# Patient Record
Sex: Female | Born: 1945 | Race: White | Hispanic: No | Marital: Married | Smoking: Former smoker
Health system: Southern US, Community
[De-identification: ages and names within clinical notes are randomized; demographics above are authoritative.]

## PROBLEM LIST (undated history)

## (undated) DIAGNOSIS — E119 Type 2 diabetes mellitus without complications: Secondary | ICD-10-CM

## (undated) DIAGNOSIS — L509 Urticaria, unspecified: Secondary | ICD-10-CM

## (undated) DIAGNOSIS — I219 Acute myocardial infarction, unspecified: Secondary | ICD-10-CM

## (undated) HISTORY — DX: Acute myocardial infarction, unspecified: I21.9

## (undated) HISTORY — PX: TUBAL LIGATION: SHX77

## (undated) HISTORY — DX: Type 2 diabetes mellitus without complications: E11.9

## (undated) HISTORY — DX: Urticaria, unspecified: L50.9

---
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
## 2004-04-16 HISTORY — PX: ABDOMINAL HYSTERECTOMY: SHX81

## 2014-07-14 DIAGNOSIS — I10 Essential (primary) hypertension: Secondary | ICD-10-CM | POA: Insufficient documentation

## 2014-07-14 DIAGNOSIS — J309 Allergic rhinitis, unspecified: Secondary | ICD-10-CM

## 2014-07-14 DIAGNOSIS — Z8542 Personal history of malignant neoplasm of other parts of uterus: Secondary | ICD-10-CM

## 2014-07-14 HISTORY — DX: Allergic rhinitis, unspecified: J30.9

## 2014-07-14 HISTORY — DX: Personal history of malignant neoplasm of other parts of uterus: Z85.42

## 2014-07-14 HISTORY — DX: Essential (primary) hypertension: I10

## 2015-01-18 DIAGNOSIS — E119 Type 2 diabetes mellitus without complications: Secondary | ICD-10-CM | POA: Insufficient documentation

## 2015-05-02 DIAGNOSIS — N183 Chronic kidney disease, stage 3 unspecified: Secondary | ICD-10-CM | POA: Insufficient documentation

## 2015-05-02 HISTORY — DX: Chronic kidney disease, stage 3 unspecified: N18.30

## 2015-09-13 DIAGNOSIS — K5730 Diverticulosis of large intestine without perforation or abscess without bleeding: Secondary | ICD-10-CM | POA: Insufficient documentation

## 2015-09-13 DIAGNOSIS — M51379 Other intervertebral disc degeneration, lumbosacral region without mention of lumbar back pain or lower extremity pain: Secondary | ICD-10-CM

## 2015-09-13 DIAGNOSIS — L219 Seborrheic dermatitis, unspecified: Secondary | ICD-10-CM

## 2015-09-13 DIAGNOSIS — M5137 Other intervertebral disc degeneration, lumbosacral region: Secondary | ICD-10-CM

## 2015-09-13 HISTORY — DX: Seborrheic dermatitis, unspecified: L21.9

## 2015-09-13 HISTORY — DX: Other intervertebral disc degeneration, lumbosacral region without mention of lumbar back pain or lower extremity pain: M51.379

## 2015-09-13 HISTORY — DX: Other intervertebral disc degeneration, lumbosacral region: M51.37

## 2015-09-13 HISTORY — DX: Diverticulosis of large intestine without perforation or abscess without bleeding: K57.30

## 2015-10-28 DIAGNOSIS — E876 Hypokalemia: Secondary | ICD-10-CM | POA: Insufficient documentation

## 2015-10-28 HISTORY — DX: Hypokalemia: E87.6

## 2016-07-17 LAB — HM MAMMOGRAPHY

## 2016-12-31 DIAGNOSIS — E785 Hyperlipidemia, unspecified: Secondary | ICD-10-CM

## 2016-12-31 DIAGNOSIS — E1169 Type 2 diabetes mellitus with other specified complication: Secondary | ICD-10-CM

## 2016-12-31 HISTORY — DX: Type 2 diabetes mellitus with other specified complication: E11.69

## 2017-09-04 DIAGNOSIS — K2100 Gastro-esophageal reflux disease with esophagitis, without bleeding: Secondary | ICD-10-CM

## 2017-09-04 HISTORY — DX: Gastro-esophageal reflux disease with esophagitis, without bleeding: K21.00

## 2017-09-07 DIAGNOSIS — I219 Acute myocardial infarction, unspecified: Secondary | ICD-10-CM

## 2017-09-07 HISTORY — DX: Acute myocardial infarction, unspecified: I21.9

## 2017-09-10 ENCOUNTER — Encounter: Payer: Self-pay | Admitting: Family Medicine

## 2017-09-12 ENCOUNTER — Encounter: Payer: Self-pay | Admitting: Family Medicine

## 2017-09-12 ENCOUNTER — Telehealth: Payer: Self-pay

## 2017-09-12 NOTE — Telephone Encounter (Signed)
Copied from CRM [id number]. Topic: Appointment Scheduling - Scheduling Inquiry for Clinic >> Sep 12, 2017 12:51 PM Oneal Grout wrote: Reason for CRM: Requesting to establish care with Dr Abner Greenspan, [facility] medicare, spouse is a current patient of Dr Hilary Hertz   Please advise

## 2017-09-12 NOTE — Telephone Encounter (Signed)
Will take her as a patient

## 2017-09-13 NOTE — Telephone Encounter (Signed)
Called pt and left voicemail for her to call back to set up new pt appt with Dr. Abner Greenspan.

## 2017-09-24 ENCOUNTER — Encounter: Payer: Self-pay | Admitting: Family Medicine

## 2017-09-24 ENCOUNTER — Ambulatory Visit: Payer: Medicare Other | Admitting: Family Medicine

## 2017-09-24 VITALS — BP 110/68 | HR 82 | Temp 98.2°F | Resp 18 | Ht 62.0 in | Wt 154.8 lb

## 2017-09-24 DIAGNOSIS — Z1231 Encounter for screening mammogram for malignant neoplasm of breast: Secondary | ICD-10-CM

## 2017-09-24 DIAGNOSIS — E119 Type 2 diabetes mellitus without complications: Secondary | ICD-10-CM

## 2017-09-24 DIAGNOSIS — E785 Hyperlipidemia, unspecified: Secondary | ICD-10-CM

## 2017-09-24 DIAGNOSIS — N183 Chronic kidney disease, stage 3 unspecified: Secondary | ICD-10-CM

## 2017-09-24 DIAGNOSIS — I2510 Atherosclerotic heart disease of native coronary artery without angina pectoris: Secondary | ICD-10-CM

## 2017-09-24 DIAGNOSIS — M545 Low back pain: Secondary | ICD-10-CM

## 2017-09-24 DIAGNOSIS — E1169 Type 2 diabetes mellitus with other specified complication: Secondary | ICD-10-CM | POA: Diagnosis not present

## 2017-09-24 DIAGNOSIS — Z1239 Encounter for other screening for malignant neoplasm of breast: Secondary | ICD-10-CM

## 2017-09-24 NOTE — Progress Notes (Signed)
Subjective:  I acted as a Education administrator for Dr. Charlett Blake. [place], [place]  Patient ID: Kristina Lee, female    DOB: 1945-09-04, 72 y.o.   MRN: [id number]  Chief Complaint  Patient presents with  . Establish Care    HPI  Patient is in today to establish care and she has recently been very ill ultimately having an MI, requiring a stent in her LAD and having a blood clot in her heart. She is now on Brilinta and tolerating it well. Her chest and jaw pain have resolved. She has a long history of low back pain which is stalbe. She has been struggling with a pruritic rash for a month it is improving and has responded some to her topical treatments has been diffuse and now is essentially resolved. Denies CP/palp/SOB/HA/congestion/fevers/GI or GU c/o. Taking meds as prescribed  Patient Care Team: Mosie Lukes, MD as PCP - General (Family Medicine)   Past Medical History:  Diagnosis Date  . Diabetes mellitus without complication ([place])   . Heart attack ([place]) 09/07/2017      Family History  Problem Relation Age of Onset  . Cancer Mother        liver cancer, breast cancer x 2   . Alcohol abuse Mother        smoker  . Heart disease Father        MI  . Alcohol abuse Father        smoker  . Depression Sister   . Tremor Sister   . Cancer Brother        pancreatic  . Diabetes Maternal Grandmother   . Hearing loss Maternal Grandmother   . Depression Maternal Grandmother     Social History   Socioeconomic History  . Marital status: Married    Spouse name: Not on file  . Number of children: Not on file  . Years of education: Not on file  . Highest education level: Not on file  Occupational History  . Not on file  Social Needs  . Financial resource strain: Not on file  . Food insecurity:    Worry: Not on file    Inability: Not on file  . Transportation needs:    Medical: Not on file    Non-medical: Not on file  Tobacco Use  . Smoking status: Never Smoker  . Smokeless tobacco: Never  Used  Substance and Sexual Activity  . Alcohol use: Not Currently  . Drug use: Not Currently  . Sexual activity: Yes  Lifestyle  . Physical activity:    Days per week: Not on file    Minutes per session: Not on file  . Stress: Not on file  Relationships  . Social connections:    Talks on phone: Not on file    Gets together: Not on file    Attends religious service: Not on file    Active member of club or organization: Not on file    Attends meetings of clubs or organizations: Not on file    Relationship status: Not on file  . Intimate partner violence:    Fear of current or ex partner: Not on file    Emotionally abused: Not on file    Physically abused: Not on file    Forced sexual activity: Not on file  Other Topics Concern  . Not on file  Social History Narrative  . Not on file    Outpatient Medications Prior to Visit  Medication Sig Dispense Refill  .  aspirin EC 81 MG tablet Take 81 mg by mouth daily.    Marland Kitchen atorvastatin (LIPITOR) 80 MG tablet Take 80 mg by mouth daily.    . diphenhydrAMINE (BENADRYL) 2 % cream Apply topically 3 (three) times daily as needed for itching.    . diphenhydrAMINE (BENADRYL) 25 MG tablet Take 25 mg by mouth every 6 (six) hours as needed.    Marland Kitchen lisinopril (PRINIVIL,ZESTRIL) 2.5 MG tablet Take 2.5 mg by mouth daily.    . ticagrelor (BRILINTA) 90 MG TABS tablet Take 90 mg by mouth 2 (two) times daily.     Marland Kitchen tretinoin (RETIN-A) 0.1 % cream Apply topically at bedtime.     No facility-administered medications prior to visit.     Allergies  Allergen Reactions  . Amoxicillin Anaphylaxis  . Hydroxyzine Itching and Other (See Comments)    Other reaction(s): Other (See Comments) Sedation Sedation Sedation   . Sulfa Antibiotics Swelling    Other reaction(s): Swelling tongue tongue tongue     Review of Systems  Constitutional: Negative for chills, fever and malaise/fatigue.  HENT: Negative for congestion and hearing loss.   Eyes: Negative  for discharge.  Respiratory: Negative for cough, sputum production and shortness of breath.   Cardiovascular: Negative for chest pain, palpitations and leg swelling.  Gastrointestinal: Negative for abdominal pain, blood in stool, constipation, diarrhea, heartburn, nausea and vomiting.  Genitourinary: Negative for dysuria, frequency, hematuria and urgency.  Musculoskeletal: Negative for back pain, falls and myalgias.  Skin: Negative for rash.  Neurological: Negative for dizziness, sensory change, loss of consciousness, weakness and headaches.  Endo/Heme/Allergies: Negative for environmental allergies. Does not bruise/bleed easily.  Psychiatric/Behavioral: Negative for depression and suicidal ideas. The patient is not nervous/anxious and does not have insomnia.        Objective:    Physical Exam  Constitutional: She is oriented to person, place, and time. No distress.  HENT:  Head: Normocephalic and atraumatic.  Right Ear: External ear normal.  Left Ear: External ear normal.  Nose: Nose normal.  Mouth/Throat: Oropharynx is clear and moist. No oropharyngeal exudate.  Eyes: Pupils are equal, round, and reactive to light. Conjunctivae are normal. Right eye exhibits no discharge. Left eye exhibits no discharge. No scleral icterus.  Neck: Normal range of motion. Neck supple. No thyromegaly present.  Cardiovascular: Normal rate, regular rhythm, normal heart sounds and intact distal pulses.  No murmur heard. Pulmonary/Chest: Effort normal and breath sounds normal. No respiratory distress. She has no wheezes. She has no rales.  Abdominal: Soft. Bowel sounds are normal. She exhibits no distension and no mass. There is no tenderness.  Musculoskeletal: Normal range of motion. She exhibits no edema or tenderness.  Lymphadenopathy:    She has no cervical adenopathy.  Neurological: She is alert and oriented to person, place, and time. She has normal reflexes. She displays normal reflexes. No cranial  nerve deficit. Coordination normal.  Skin: Skin is warm and dry. No rash noted. She is not diaphoretic.    BP 110/68 (BP Location: Left Arm, Patient Position: Sitting, Cuff Size: Normal)   Pulse 82   Temp 98.2 F (36.8 C) (Oral)   Resp 18   Ht 5' 2"  (1.575 m)   Wt 154 lb 12.8 oz (70.2 kg)   SpO2 97%   BMI 28.31 kg/m  Wt Readings from Last 3 Encounters:  09/24/17 154 lb 12.8 oz (70.2 kg)   BP Readings from Last 3 Encounters:  09/24/17 110/68      There is  no immunization history on file for this patient.  Health Maintenance  Topic Date Due  . HEMOGLOBIN A1C  July 01, 1945  . Hepatitis C Screening  Jun 05, 1945  . FOOT EXAM  09/07/1955  . OPHTHALMOLOGY EXAM  09/07/1955  . TETANUS/TDAP  09/06/1964  . MAMMOGRAM  09/07/1995  . COLONOSCOPY  09/07/1995  . DEXA SCAN  09/07/2010  . PNA vac Low Risk Adult (1 of 2 - PCV13) 09/07/2010  . INFLUENZA VACCINE  11/14/2017    No results found for: WBC, HGB, HCT, PLT, GLUCOSE, CHOL, TRIG, HDL, LDLDIRECT, LDLCALC, ALT, AST, NA, K, CL, CREATININE, BUN, CO2, TSH, PSA, INR, GLUF, HGBA1C, MICROALBUR  No results found for: TSH No results found for: WBC, HGB, HCT, MCV, PLT No results found for: NA, K, CHLORIDE, CO2, GLUCOSE, BUN, CREATININE, BILITOT, ALKPHOS, AST, ALT, PROT, ALBUMIN, CALCIUM, ANIONGAP, EGFR, GFR No results found for: CHOL No results found for: HDL No results found for: LDLCALC No results found for: TRIG No results found for: CHOLHDL No results found for: HGBA1C       Assessment & Plan:   Problem List Items Addressed This Visit    Chronic kidney disease, stage III (moderate) ([place])    Hydrate well and monitor will request old records      Hyperlipidemia associated with type 2 diabetes mellitus ([place])    Tolerating statin, encouraged heart healthy diet, avoid trans fats, minimize simple carbs and saturated fats. Increase exercise as tolerated      Relevant Medications   atorvastatin (LIPITOR) 80 MG tablet   aspirin EC  81 MG tablet   lisinopril (PRINIVIL,ZESTRIL) 2.5 MG tablet   Type 2 diabetes mellitus without complication, without long-term current use of insulin ([facility])    hgba1c acceptable, minimize simple carbs. Increase exercise as tolerated. Historically diet controlled. Follows annually with opthamology.      Relevant Medications   atorvastatin (LIPITOR) 80 MG tablet   aspirin EC 81 MG tablet   lisinopril (PRINIVIL,ZESTRIL) 2.5 MG tablet   CAD (coronary artery disease)    S/p stenting of LAD recently after several weeks of symptoms. Has tolerated Brilinta and has follow up with cardiology soon      Relevant Medications   atorvastatin (LIPITOR) 80 MG tablet   aspirin EC 81 MG tablet   lisinopril (PRINIVIL,ZESTRIL) 2.5 MG tablet   Low back pain    Encouraged moist heat and gentle stretching as tolerated. May try Tylenol and prescription meds as directed and report if symptoms worsen or seek immediate care. Lidocaine prn. She reports she has historically followed with a Dr Demetrius Charity of neurology for this      Relevant Medications   aspirin EC 81 MG tablet    Other Visit Diagnoses    Breast cancer screening    -  Primary   Relevant Orders   MM DIAG BREAST TOMO BILATERAL      I am having Pearletha Forge maintain her atorvastatin, ticagrelor, aspirin EC, lisinopril, diphenhydrAMINE, diphenhydrAMINE, and tretinoin.  No orders of the defined types were placed in this encounter.   CMA served as Education administrator during this visit. History, Physical and Plan performed by medical provider. Documentation and orders reviewed and attested to.  Penni Homans, MD

## 2017-09-24 NOTE — Patient Instructions (Addendum)
DASH or MIND diet   Recommend calcium intake of 1200 to 1500 mg daily, divided into roughly 3 doses. Best source is the diet and a single dairy serving is about 500 mg, a supplement of calcium citrate once or twice daily to balance diet is fine if not getting enough in diet. Also need Vitamin D 2000 IU caps, 1 cap daily if not already taking vitamin D. Also recommend weight baring exercise on hips and upper body to keep bones strong Coronary Artery Disease, Female Coronary artery disease (CAD) is a condition in which the arteries that lead to the heart (coronary arteries) become narrow or blocked. The narrowing or blockage can lead to decreased blood flow to the heart. Prolonged reduced blood flow can cause a heart attack (myocardial infarction or MI). This condition may also be called coronary heart disease. Because CAD is the leading cause of death in women, it is important to understand what causes this condition and how it is treated. What are the causes? CAD is most often caused by atherosclerosis. This is the buildup of fat and cholesterol (plaque) on the inside of the arteries. Over time, the plaque may narrow or block the artery, reducing blood flow to the heart. Plaque can also become weak and break off within a coronary artery and cause a sudden blockage. Other less common causes of CAD include:  An embolism or blood clot in a coronary artery.  A tearing of the artery (spontaneous coronary artery dissection).  An aneurysm.  Inflammation (vasculitis) in the artery wall.  What increases the risk? The following factors may make you more likely to develop this condition:  Age. Women over age 9 are at a greater risk of CAD.  Family history of CAD.  High blood pressure (hypertension).  Diabetes.  High cholesterol levels.  Tobacco use.  Lack of exercise.  Menopause. ? All postmenopausal women are at greater risk of CAD. ? Women who have experienced menopause between the ages  of 67-45 (early menopause) are at a higher risk of CAD. ? Women who have experienced menopause before age 13 (premature menopause) are at a very high risk of CAD.  Excessive alcohol use  A diet high in saturated and trans fats, such as fried food and processed meat.  Other possible risk factors include:  High stress levels.  Depression  Obesity.  Sleep apnea.  What are the signs or symptoms? Many people do not have any symptoms during the early stages of CAD. As the condition progresses, symptoms may include:  Chest pain (angina). The pain can: ? Feel like crushing or squeezing, or a tightness, pressure, fullness, or heaviness in the chest. ? Last more than a few minutes or can stop and recur. The pain tends to get worse with exercise or stress and to fade with rest.  Pain in the arms, neck, jaw, or back.  Unexplained heartburn or indigestion.  Shortness of breath.  Nausea.  Sudden cold sweats.  Sudden light-headedness.  Fluttering or fast heartbeat (palpitations).  Many women have chest discomfort and the other symptoms. However, women often have unusual (atypical) symptoms, such as:  Fatigue.  Vomiting.  Unexplained feelings of nervousness or anxiety.  Unexplained weakness.  Dizziness or fainting.  How is this diagnosed? This condition is diagnosed based on:  Your family and medical history.  A physical exam.  Tests, including: ? A test to check the electrical signals in your heart (electrocardiogram). ? Exercise stress test. This looks for signs of blockage  when the heart is stressed with exercise, such as running on a treadmill. ? Pharmacologic stress test. This test looks for signs of blockage when the heart is being stressed with a medicine. ? Blood tests. ? Coronary angiogram. This is a procedure to look at the coronary arteries to see if there is any blockage. During this test, a dye is injected into your arteries so they appear on an X-ray. ? A  test that uses sound waves to take a picture of your heart (echocardiogram). ? Chest X-ray.  How is this treated? This condition may be treated by:  Healthy lifestyle changes to reduce risk factors.  Medicines such as: ? Antiplatelet medicines and blood-thinning medicines, such as aspirin. These help prevent blood clots. ? Nitroglycerin. ? Blood pressure medicines. ? Cholesterol-lowering medicine.  Coronary angioplasty and stenting. During this procedure, a thin, flexible tube is inserted through a blood vessel and into a blocked artery. A balloon or similar device on the end of the tube is inflated to open up the artery. In some cases, a small, mesh tube (stent) is inserted into the artery to keep it open.  Coronary artery bypass surgery. During this surgery, veins or arteries from other parts of the body are used to create a bypass around the blockage and allow blood to reach your heart.  Follow these instructions at home: Medicines  Take over-the-counter and prescription medicines only as told by your health care provider.  Do not take the following medicines unless your health care provider approves: ? NSAIDs, such as ibuprofen, naproxen, or celecoxib. ? Vitamin supplements that contain vitamin A, vitamin E, or both. ? Hormone replacement therapy that contains estrogen with or without progestin. Lifestyle  Follow an exercise program approved by your health care provider. Aim for 150 minutes of moderate exercise or 75 minutes of vigorous exercise each week.  Maintain a healthy weight or lose weight as approved by your health care provider.  Rest when you are tired.  Learn to manage stress or try to limit your stress. Ask your health care provider for suggestions if you need help.  Get screened for depression and seek treatment, if needed.  Do not use any products that contain nicotine or tobacco, such as cigarettes and e-cigarettes. If you need help quitting, ask your health  care provider.  Do not use illegal drugs. Eating and drinking  Follow a heart-healthy diet. A dietitian can help educate you about healthy food options and changes. In general, eat plenty of fruits and vegetables, lean meats, and whole grains.  Avoid foods high in: ? Sugar. ? Salt (sodium). ? Saturated fats, such as processed or fatty meat. ? Trans fats, such as fried food.  Use healthy cooking methods such as roasting, grilling, broiling, baking, poaching, steaming, or stir-frying.  If you drink alcohol, and your health care provider approves, limit your alcohol intake to no more than 1 drink per day. One drink equals 12 ounces of beer, 5 ounces of wine, or 1 ounces of hard liquor. General instructions  Manage any other health conditions, such as hypertension and diabetes. These conditions affect your heart.  Your health care provider may ask you to monitor your blood pressure. Ideally, your blood pressure should be below 130/80.  Keep all follow-up visits as told by your health care provider. This is important. Get help right away if:  You have pain in your chest, neck, arm, jaw, stomach, or back that: ? Lasts more than a few minutes. ?  Is recurring. ? Is not relieved by taking medicine under your tongue (sublingualnitroglycerin).  You have profuse sweating without cause.  You have unexplained: ? Heartburn or indigestion. ? Shortness of breath or difficulty breathing. ? Fluttering or fast heartbeat (palpitations). ? Nausea or vomiting. ? Fatigue. ? Feelings of nervousness or anxiety. ? Weakness. ? Diarrhea.  You have sudden light-headedness or dizziness.  You faint.  You feel like hurting yourself or think about taking your own life. These symptoms may represent a serious problem that is an emergency. Do not wait to see if the symptoms will go away. Get medical help right away. Call your local emergency services (911 in the [place] Do not drive yourself to the  hospital. Summary  Coronary artery disease (CAD) is a process in which the arteries that lead to the heart (coronary arteries) become narrow or blocked. The narrowing or blockage can lead to a heart attack.  Many women have chest discomfort and other common symptoms of CAD. However, women often have different (atypical) symptoms, such as fatigue, vomiting, and dizziness or weakness.  CAD can be treated with lifestyle changes, medicines, surgery, or a combination of these treatments. This information is not intended to replace advice given to you by your health care provider. Make sure you discuss any questions you have with your health care provider. Document Released: 06/25/2011 Document Revised: 03/23/2016 Document Reviewed: 03/23/2016 Elsevier Interactive Patient Education  [employer].

## 2017-09-25 DIAGNOSIS — M545 Low back pain, unspecified: Secondary | ICD-10-CM

## 2017-09-25 DIAGNOSIS — I2510 Atherosclerotic heart disease of native coronary artery without angina pectoris: Secondary | ICD-10-CM | POA: Insufficient documentation

## 2017-09-25 HISTORY — DX: Low back pain, unspecified: M54.50

## 2017-09-25 HISTORY — DX: Atherosclerotic heart disease of native coronary artery without angina pectoris: I25.10

## 2017-09-25 NOTE — Assessment & Plan Note (Signed)
hgba1c acceptable, minimize simple carbs. Increase exercise as tolerated. Historically diet controlled. Follows annually with opthamology.

## 2017-09-25 NOTE — Assessment & Plan Note (Signed)
S/p stenting of LAD recently after several weeks of symptoms. Has tolerated Brilinta and has follow up with cardiology soon

## 2017-09-25 NOTE — Assessment & Plan Note (Signed)
Hydrate well and monitor will request old records

## 2017-09-25 NOTE — Assessment & Plan Note (Addendum)
Encouraged moist heat and gentle stretching as tolerated. May try Tylenol and prescription meds as directed and report if symptoms worsen or seek immediate care. Lidocaine prn. She reports she has historically followed with a Dr Jaye Beagle of neurology for this

## 2017-09-25 NOTE — Assessment & Plan Note (Signed)
Tolerating statin, encouraged heart healthy diet, avoid trans fats, minimize simple carbs and saturated fats. Increase exercise as tolerated 

## 2017-09-26 ENCOUNTER — Telehealth: Payer: Self-pay | Admitting: *Deleted

## 2017-09-26 NOTE — Telephone Encounter (Signed)
Received Medical records from [facility]; forwarded to provider/SLS 06/13

## 2017-10-01 ENCOUNTER — Telehealth: Payer: Self-pay | Admitting: Family Medicine

## 2017-10-01 NOTE — Telephone Encounter (Signed)
Have her start Zyrtec 10 mg and Zantac 150 mg both twice daily and send in a medrol dose pak for her to take if it does not improve. If none of this works will need to see an allergist to manage.

## 2017-10-01 NOTE — Telephone Encounter (Signed)
Copied from CRM [id number]. Topic: Quick Communication - See Telephone Encounter >> Oct 01, 2017  1:13 PM Kristina Lee wrote: Hives have come back on legs.  Pt is wanting to know what she needs to do now.

## 2017-10-01 NOTE — Telephone Encounter (Signed)
Please advise 

## 2017-10-03 ENCOUNTER — Other Ambulatory Visit: Payer: Self-pay | Admitting: *Deleted

## 2017-10-03 MED ORDER — METHYLPREDNISOLONE 4 MG PO TBPK: ORAL_TABLET | ORAL | 0 refills | Status: DC

## 2017-10-03 MED ORDER — RANITIDINE HCL 150 MG PO CAPS: 150.0000 mg | ORAL_CAPSULE | Freq: Two times a day (BID) | ORAL | 0 refills | Status: DC

## 2017-10-03 MED ORDER — METHYLPREDNISOLONE 4 MG PO TABS: ORAL_TABLET | ORAL | 0 refills | Status: DC

## 2017-10-03 MED ORDER — CETIRIZINE HCL 10 MG PO TABS: 10.0000 mg | ORAL_TABLET | Freq: Two times a day (BID) | ORAL | 0 refills | Status: DC

## 2017-10-03 NOTE — Telephone Encounter (Signed)
Patient made aware  Sent medications in

## 2017-10-07 ENCOUNTER — Other Ambulatory Visit: Payer: Self-pay | Admitting: Family Medicine

## 2017-10-07 DIAGNOSIS — Z1231 Encounter for screening mammogram for malignant neoplasm of breast: Secondary | ICD-10-CM

## 2017-10-11 ENCOUNTER — Encounter: Payer: Self-pay | Admitting: Family Medicine

## 2017-10-11 ENCOUNTER — Telehealth: Payer: Self-pay | Admitting: Family Medicine

## 2017-10-11 NOTE — Telephone Encounter (Signed)
Copied from CRM [id number]. Topic: Quick Communication - See Telephone Encounter >> Oct 11, 2017  8:32 AM Tamela Oddi, NT wrote: CRM for notification. See Telephone encounter for: 10/11/17. Patient called and states she was in [place] and had an heart attack. Her husband took all her medicine to the hospital there and did not get them back. Patient is wondering if Dr. Abner Greenspan can prescribed her methocarbamol for her back spasms. Patient states she is going out of town tomorrow and is wanting a RX until she see Dr. Abner Greenspan in Sept.   [id number] - [place], [place] - [address] AT [address] [phone] (Phone) [phone] (Fax)

## 2017-10-12 NOTE — Telephone Encounter (Signed)
Patient called, left VM to return call back to the office about Methocarbamol for her back spasms. This medication is not listed on the her medication profile present or past profile. She is asking for a refill until she is seen by Dr. Abner Greenspan in September.

## 2017-10-14 NOTE — Telephone Encounter (Signed)
Patient wants rx for Methocarbamol for back spasms Medication  never on list, nor history   Please advise

## 2017-10-14 NOTE — Telephone Encounter (Signed)
OK to prescribe Methocarbimal 500 mg tabs, 1 tab po bid muscle spasm/pain, disp #30

## 2017-10-15 ENCOUNTER — Other Ambulatory Visit: Payer: Self-pay | Admitting: Family Medicine

## 2017-10-15 ENCOUNTER — Telehealth: Payer: Self-pay | Admitting: Family Medicine

## 2017-10-15 MED ORDER — METHYLPREDNISOLONE 4 MG PO TBPK: ORAL_TABLET | ORAL | 0 refills | Status: DC

## 2017-10-15 NOTE — Telephone Encounter (Signed)
I sent in the Medrol refill but if she does not get better will need to be reevaluated

## 2017-10-15 NOTE — Telephone Encounter (Signed)
Patient would like a refill on back pain meds.  Patient would like a call once refill has been sent

## 2017-10-22 ENCOUNTER — Telehealth: Payer: Self-pay | Admitting: Family Medicine

## 2017-10-22 NOTE — Telephone Encounter (Signed)
Copied from CRM [id number]. Topic: Appointment Scheduling - Scheduling Inquiry for Clinic >> Oct 22, 2017  5:33 PM Stephannie Li, [place] wrote: Reason for CRM: Shanda Bumps from [place] [facility] called and said the patient would like  to be scheduled for a AWV ,  please call her at  [phone]

## 2017-10-25 ENCOUNTER — Ambulatory Visit: Payer: Medicare Other | Admitting: Cardiovascular Disease

## 2017-10-25 ENCOUNTER — Other Ambulatory Visit: Payer: Self-pay | Admitting: Family Medicine

## 2017-10-25 ENCOUNTER — Encounter: Payer: Self-pay | Admitting: Cardiovascular Disease

## 2017-10-25 VITALS — BP 126/70 | HR 60 | Ht 62.0 in | Wt 147.0 lb

## 2017-10-25 DIAGNOSIS — I2510 Atherosclerotic heart disease of native coronary artery without angina pectoris: Secondary | ICD-10-CM

## 2017-10-25 DIAGNOSIS — I10 Essential (primary) hypertension: Secondary | ICD-10-CM | POA: Diagnosis not present

## 2017-10-25 DIAGNOSIS — E1169 Type 2 diabetes mellitus with other specified complication: Secondary | ICD-10-CM

## 2017-10-25 DIAGNOSIS — E785 Hyperlipidemia, unspecified: Secondary | ICD-10-CM

## 2017-10-25 DIAGNOSIS — I252 Old myocardial infarction: Secondary | ICD-10-CM | POA: Diagnosis not present

## 2017-10-25 NOTE — Progress Notes (Signed)
10/25/2017 Kristina Lee   [birth year]  [id number]  Primary Physician Bradd Canary, MD Primary Cardiologist: Runell Gess MD Nicholes Calamity, [place]  HPI:  Kristina Lee is a 72 y.o. thin appearing married Caucasian female mother of one biologic child, grandmother to 37 grandchildren who worked as a Economist.  She was referred by [facility]ia vascular evaluation and to be established in my practice because of her recent non-STEMI and intervention.  Risk factors include remote tobacco abuse having quit on 01/04/1988 after smoking 25 pack years.  She has treated hypertension, hyperlipidemia and diabetes which is not treated.  Her father did die of a microinfarction at age 33.  She had atypical symptoms leading up to a non-STEMI 09/02/2017 she was in [place] and had stenting of her proximal LAD via her right radial approach.  She had no other significant CAD.  EF was in the 45 to 50% range.  She is had no recurrent symptoms.   Current Meds  Medication Sig  . aspirin EC 81 MG tablet Take 81 mg by mouth daily.  Marland Kitchen atorvastatin (LIPITOR) 80 MG tablet Take 80 mg by mouth daily.  . cetirizine (ZYRTEC) 10 MG tablet Take 1 tablet (10 mg total) by mouth 2 (two) times daily.  . diphenhydrAMINE (BENADRYL) 2 % cream Apply topically 3 (three) times daily as needed for itching.  Marland Kitchen lisinopril (PRINIVIL,ZESTRIL) 2.5 MG tablet Take 2.5 mg by mouth daily.  . ranitidine (ZANTAC) 150 MG capsule Take 1 capsule (150 mg total) by mouth 2 (two) times daily.  . ticagrelor (BRILINTA) 90 MG TABS tablet Take 90 mg by mouth 2 (two) times daily.   Marland Kitchen tretinoin (RETIN-A) 0.1 % cream Apply topically at bedtime.     Allergies  Allergen Reactions  . Amoxicillin Anaphylaxis  . Hydroxyzine Itching and Other (See Comments)    Other reaction(s): Other (See Comments) Sedation Sedation Sedation   . Sulfa Antibiotics Swelling    Other reaction(s): Swelling tongue tongue tongue      Social History   Socioeconomic History  . Marital status: Married    Spouse name: Not on file  . Number of children: Not on file  . Years of education: Not on file  . Highest education level: Not on file  Occupational History  . Not on file  Social Needs  . Financial resource strain: Not on file  . Food insecurity:    Worry: Not on file    Inability: Not on file  . Transportation needs:    Medical: Not on file    Non-medical: Not on file  Tobacco Use  . Smoking status: Former Games developer  . Smokeless tobacco: Never Used  Substance and Sexual Activity  . Alcohol use: Not Currently  . Drug use: Not Currently  . Sexual activity: Yes  Lifestyle  . Physical activity:    Days per week: Not on file    Minutes per session: Not on file  . Stress: Not on file  Relationships  . Social connections:    Talks on phone: Not on file    Gets together: Not on file    Attends religious service: Not on file    Active member of club or organization: Not on file    Attends meetings of clubs or organizations: Not on file    Relationship status: Not on file  . Intimate partner violence:    Fear of current or ex partner: Not on file  Emotionally abused: Not on file    Physically abused: Not on file    Forced sexual activity: Not on file  Other Topics Concern  . Not on file  Social History Narrative  . Not on file     Review of Systems: General: negative for chills, fever, night sweats or weight changes.  Cardiovascular: negative for chest pain, dyspnea on exertion, edema, orthopnea, palpitations, paroxysmal nocturnal dyspnea or shortness of breath Dermatological: negative for rash Respiratory: negative for cough or wheezing Urologic: negative for hematuria Abdominal: negative for nausea, vomiting, diarrhea, bright red blood per rectum, melena, or hematemesis Neurologic: negative for visual changes, syncope, or dizziness All other systems reviewed and are otherwise negative except  as noted above.    Blood pressure 126/70, pulse 60, height 5\' 2"  (1.575 m), weight 147 lb (66.7 kg).  General appearance: alert and no distress Neck: no adenopathy, no carotid bruit, no JVD, supple, symmetrical, trachea midline and thyroid not enlarged, symmetric, no tenderness/mass/nodules Lungs: clear to auscultation bilaterally Heart: regular rate and rhythm, S1, S2 normal, no murmur, click, rub or gallop Extremities: extremities normal, atraumatic, no cyanosis or edema Pulses: 2+ and symmetric Skin: Skin color, texture, turgor normal. No rashes or lesions Neurologic: Alert and oriented X 3, normal strength and tone. Normal symmetric reflexes. Normal coordination and gait  EKG sinus rhythm at 60 with nonspecific ST and T wave changes and borderline LVH.  I personally reviewed this EKG.  ASSESSMENT AND PLAN:   Essential hypertension History of essential hypertension her blood pressure measured at 126/70.  She is on lisinopril.  Continue current meds at current dosing.  Hyperlipidemia associated with type 2 diabetes mellitus (HCC) History of hyperlipidemia on statin therapy.  CAD (coronary artery disease) History of CAD status post non-STEMI 09/02/2017 at [facility] in [place].  She underwent proximal LAD stenting via a radial approach.  EF was 45 to 50% she is on dual and type of therapy with aspirin and Brilinta.  We will recheck a 2D echo for LV function.  I will see her back in 1 year for follow-up.      Runell Gess MD FACP,FACC,FAHA, [facility] 10/25/2017 9:31 AM

## 2017-10-25 NOTE — Patient Instructions (Addendum)
Medication Instructions:  Your physician recommends that you continue on your current medications as directed. Please refer to the Current Medication list given to you today.   Labwork: none  Testing/Procedures: Your physician has requested that you have an echocardiogram. Echocardiography is a painless test that uses sound waves to create images of your heart. It provides your doctor with information about the size and shape of your heart and how well your heart's chambers and valves are working. This procedure takes approximately one hour. There are no restrictions for this procedure.    Follow-Up: Your physician wants you to follow-up in: 12 months with Dr. Berry. You will receive a reminder letter in the mail two months in advance. If you don't receive a letter, please call our office to schedule the follow-up appointment.   Any Other Special Instructions Will Be Listed Below (If Applicable).     If you need a refill on your cardiac medications before your next appointment, please call your pharmacy.   

## 2017-10-25 NOTE — Assessment & Plan Note (Signed)
History of hyperlipidemia on statin therapy. 

## 2017-10-25 NOTE — Addendum Note (Signed)
Addended by: Theressa Stamps on: 10/25/2017 10:04 AM   Modules accepted: Orders

## 2017-10-25 NOTE — Assessment & Plan Note (Signed)
History of essential hypertension her blood pressure measured at 126/70.  She is on lisinopril.  Continue current meds at current dosing.

## 2017-10-25 NOTE — Assessment & Plan Note (Signed)
History of CAD status post non-STEMI 09/02/2017 at [facility] in [place].  She underwent proximal LAD stenting via a radial approach.  EF was 45 to 50% she is on dual and type of therapy with aspirin and Brilinta.  We will recheck a 2D echo for LV function.  I will see her back in 1 year for follow-up.

## 2017-10-28 ENCOUNTER — Ambulatory Visit
Admission: RE | Admit: 2017-10-28 | Discharge: 2017-10-28 | Disposition: A | Discharge status: Home / Self Care | Payer: Medicare Other | Source: Ambulatory Visit | Attending: Family Medicine | Admitting: Family Medicine

## 2017-10-28 DIAGNOSIS — Z1231 Encounter for screening mammogram for malignant neoplasm of breast: Secondary | ICD-10-CM

## 2017-10-28 NOTE — Progress Notes (Deleted)
Subjective:   Kristina Lee is a 72 y.o. female who presents for an Initial Medicare Annual Wellness Visit.  Review of Systems  No ROS.  Medicare Wellness Visit. Additional risk factors are reflected in the social history.   Sleep patterns:   Home Safety/Smoke Alarms: Feels safe in home. Smoke alarms in place.  Living environment; residence and Firearm Safety:    Female:         Mammo-       Dexa scan-        CCS-    Objective:    There were no vitals filed for this visit. There is no height or weight on file to calculate BMI.  No flowsheet data found.  Current Medications (verified) Outpatient Encounter Medications as of 10/29/2017  Medication Sig  . aspirin EC 81 MG tablet Take 81 mg by mouth daily.  Marland Kitchen atorvastatin (LIPITOR) 80 MG tablet Take 80 mg by mouth daily.  . cetirizine (ZYRTEC) 10 MG tablet Take 1 tablet (10 mg total) by mouth 2 (two) times daily.  . diphenhydrAMINE (BENADRYL) 2 % cream Apply topically 3 (three) times daily as needed for itching.  Marland Kitchen lisinopril (PRINIVIL,ZESTRIL) 2.5 MG tablet Take 2.5 mg by mouth daily.  . ranitidine (ZANTAC) 150 MG capsule TAKE 1 CAPSULE BY MOUTH TWICE A DAY  . ticagrelor (BRILINTA) 90 MG TABS tablet Take 90 mg by mouth 2 (two) times daily.   Marland Kitchen tretinoin (RETIN-A) 0.1 % cream Apply topically at bedtime.   No facility-administered encounter medications on file as of 10/29/2017.     Allergies (verified) Amoxicillin; Hydroxyzine; and Sulfa antibiotics   History: Past Medical History:  Diagnosis Date  . Diabetes mellitus without complication (HCC)   . Heart attack (HCC) 09/07/2017   Past Surgical History:  Procedure Laterality Date  . ABDOMINAL HYSTERECTOMY  2006   b/l SPO and TAH, uterine cancer  . EYE SURGERY Bilateral 1960  . TUBAL LIGATION     Family History  Problem Relation Age of Onset  . Cancer Mother        liver cancer, breast cancer x 2   . Alcohol abuse Mother        smoker  . Heart disease Father      MI  . Alcohol abuse Father        smoker  . Depression Sister   . Tremor Sister   . Cancer Brother        pancreatic  . Diabetes Maternal Grandmother   . Hearing loss Maternal Grandmother   . Depression Maternal Grandmother    Social History   Socioeconomic History  . Marital status: Married    Spouse name: Not on file  . Number of children: Not on file  . Years of education: Not on file  . Highest education level: Not on file  Occupational History  . Not on file  Social Needs  . Financial resource strain: Not on file  . Food insecurity:    Worry: Not on file    Inability: Not on file  . Transportation needs:    Medical: Not on file    Non-medical: Not on file  Tobacco Use  . Smoking status: Former Games developer  . Smokeless tobacco: Never Used  Substance and Sexual Activity  . Alcohol use: Not Currently  . Drug use: Not Currently  . Sexual activity: Yes  Lifestyle  . Physical activity:    Days per week: Not on file    Minutes per session:  Not on file  . Stress: Not on file  Relationships  . Social connections:    Talks on phone: Not on file    Gets together: Not on file    Attends religious service: Not on file    Active member of club or organization: Not on file    Attends meetings of clubs or organizations: Not on file    Relationship status: Not on file  Other Topics Concern  . Not on file  Social History Narrative  . Not on file    Tobacco Counseling Counseling given: Not Answered   Clinical Intake:                        Activities of Daily Living No flowsheet data found.   Immunizations and Health Maintenance Immunization History  Administered Date(s) Administered  . Influenza, High Dose Seasonal PF 01/18/2015, 02/02/2016, 01/17/2017  . Influenza,inj,Quad PF,6+ Mos 02/14/2014  . Pneumococcal Conjugate-13 05/27/2013, 05/27/2013  . Pneumococcal Polysaccharide-23 04/16/2014, 04/16/2014  . Tdap 07/14/2014, 07/14/2014  . Zoster  04/17/2011, 04/17/2011   Health Maintenance Due  Topic Date Due  . HEMOGLOBIN A1C  May 02, 1945  . Hepatitis C Screening  07/08/1945  . FOOT EXAM  09/07/1955  . OPHTHALMOLOGY EXAM  09/07/1955  . COLONOSCOPY  09/07/1995  . DEXA SCAN  09/07/2010    Patient Care Team: Bradd Canary, MD as PCP - General (Family Medicine)  Indicate any recent Medical Services you may have received from other than [facility] providers in the past year (date may be approximate).     Assessment:   This is a routine wellness examination for Kristina Lee. Physical assessment deferred to PCP.  Hearing/Vision screen No exam data present  Dietary issues and exercise activities discussed:   Diet (meal preparation, eat out, water intake, caffeinated beverages, dairy products, fruits and vegetables): {Desc; diets:16563} Breakfast: Lunch:  Dinner:      Goals    None     Depression Screen No flowsheet data found.  Fall Risk No flowsheet data found.   Cognitive Function:        Screening Tests Health Maintenance  Topic Date Due  . HEMOGLOBIN A1C  20-Sep-1945  . Hepatitis C Screening  15-Jan-1946  . FOOT EXAM  09/07/1955  . OPHTHALMOLOGY EXAM  09/07/1955  . COLONOSCOPY  09/07/1995  . DEXA SCAN  09/07/2010  . INFLUENZA VACCINE  11/14/2017  . MAMMOGRAM  07/18/2018  . TETANUS/TDAP  07/13/2024  . PNA vac Low Risk Adult  Completed     Plan:   ***  I have personally reviewed and noted the following in the patient's chart:   . Medical and social history . Use of alcohol, tobacco or illicit drugs  . Current medications and supplements . Functional ability and status . Nutritional status . Physical activity . Advanced directives . List of other physicians . Hospitalizations, surgeries, and ER visits in previous 12 months . Vitals . Screenings to include cognitive, depression, and falls . Referrals and appointments  In addition, I have reviewed and discussed with patient certain preventive  protocols, quality metrics, and best practice recommendations. A written personalized care plan for preventive services as well as general preventive health recommendations were provided to patient.     Avon Gully, [place]   10/28/2017

## 2017-10-29 ENCOUNTER — Ambulatory Visit: Payer: Medicare Other | Admitting: *Deleted

## 2017-10-30 ENCOUNTER — Other Ambulatory Visit: Payer: Self-pay | Admitting: Family Medicine

## 2017-11-01 ENCOUNTER — Telehealth: Payer: Self-pay | Admitting: Family Medicine

## 2017-11-01 DIAGNOSIS — L509 Urticaria, unspecified: Secondary | ICD-10-CM

## 2017-11-01 NOTE — Telephone Encounter (Signed)
Copied from CRM [id number]. Topic: Quick Communication - See Telephone Encounter >> Nov 01, 2017  8:48 AM Jolayne Haines L wrote: CRM for notification. See Telephone encounter for: 11/01/17.   Patient states that she was in on 6/11 for her new patient appt and had the hives. Dr Abner Greenspan gave her diphenhydrAMINE (BENADRYL) 2 % cream & cetirizine (ZYRTEC) 10 MG tablet. She said that it will get better then it will get worse and back and forth. Please advise. She said that she still has the hives

## 2017-11-04 ENCOUNTER — Ambulatory Visit: Payer: Self-pay | Admitting: *Deleted

## 2017-11-04 DIAGNOSIS — L509 Urticaria, unspecified: Secondary | ICD-10-CM

## 2017-11-04 NOTE — Telephone Encounter (Signed)
Pt reports H/O outbreak of hives since April; cause unknown per patient. States saw dermatologist MAy 12th. Has been on several rounds of prednisone, "Some injections, all kinds of medications and not helping much." States saw Dr. Abner Greenspan, placed on Zyrtec; states not helping. States has been taking benadryl and using creams, all ineffective. Reports hives are smaller than dime size, on both legs at calves, from knee to ankles, multiple. Severe itching on right, less so left leg. States hives resolved on chest, arms and back earlier but have worsened on legs. Denies any SOB, dysphagia, tongue or throat swelling.  Pt states Dr. Abner Greenspan mentioned referral to allergist. Pt requesting referral. Questioning if need to be seen by Dr. Abner Greenspan first. Care advise given. Please advise: [phone]  Reason for Disposition . [1] Hives has occurred 3 or more times in the last year AND [2] the cause was not found  Answer Assessment - Initial Assessment Questions 1. APPEARANCE: "What does the rash look like?"      Hives 2. LOCATION: "Where is the rash located?"      Both legs from knees to ankles, at calf 3. NUMBER: "How many hives are there?"      many 4. SIZE: "How big are the hives?" (inches, cm, compare to coins) "Do they all look the same or is there lots of variation in shape and size?"      Smaller than dime size 5. ONSET: "When did the hives begin?" (Hours or days ago)      Months ago; end of April, still on legs 6. ITCHING: "Does it itch?" If so, ask: "How bad is the itch?"    - MILD: doesn't interfere with normal activities   - MODERATE - SEVERE: interferes with work, school, sleep, or other activities      Severe on right, less so on left 7. RECURRENT PROBLEM: "Have you had hives before?" If so, ask: "When was the last time?" and "What happened that time?"      Yes, unsure of cause 8. TRIGGERS: "Were you exposed to any new food, plant, cosmetic product or animal just before the hives began?"      no 9. OTHER SYMPTOMS: "Do you have any other symptoms?" (e.g., fever, tongue swelling, difficulty breathing, abdominal pain)     no  Protocols used: HIVES-A-AH

## 2017-11-04 NOTE — Telephone Encounter (Signed)
Sometimes people can get hives after an infectious illness like a virus. Please set her up with derm but also order EBV, parvovirus, lyme disease, MSF and sed rate to rule these out.

## 2017-11-04 NOTE — Telephone Encounter (Signed)
Spoke with patient she stated she wants the referral. I have sent it in.  She has refused medrol dose pack at this time will call back if she wants it.

## 2017-11-04 NOTE — Telephone Encounter (Signed)
Please advise 

## 2017-11-04 NOTE — Telephone Encounter (Signed)
So is she taking the Ranitidine if yes then we can refer her to dermatology for testing. If no start it twice daily as instructed and see if that helps. Can also give a medrol dose pack to calm it down

## 2017-11-05 ENCOUNTER — Other Ambulatory Visit: Payer: Self-pay

## 2017-11-05 ENCOUNTER — Ambulatory Visit (HOSPITAL_COMMUNITY): Payer: Medicare Other | Attending: Cardiology

## 2017-11-05 DIAGNOSIS — E785 Hyperlipidemia, unspecified: Secondary | ICD-10-CM | POA: Insufficient documentation

## 2017-11-05 DIAGNOSIS — I252 Old myocardial infarction: Secondary | ICD-10-CM

## 2017-11-05 DIAGNOSIS — I219 Acute myocardial infarction, unspecified: Secondary | ICD-10-CM | POA: Diagnosis not present

## 2017-11-05 DIAGNOSIS — I509 Heart failure, unspecified: Secondary | ICD-10-CM | POA: Diagnosis not present

## 2017-11-05 DIAGNOSIS — Z720 Tobacco use: Secondary | ICD-10-CM | POA: Insufficient documentation

## 2017-11-05 DIAGNOSIS — I10 Essential (primary) hypertension: Secondary | ICD-10-CM | POA: Diagnosis present

## 2017-11-05 DIAGNOSIS — I110 Hypertensive heart disease with heart failure: Secondary | ICD-10-CM | POA: Insufficient documentation

## 2017-11-05 DIAGNOSIS — I2510 Atherosclerotic heart disease of native coronary artery without angina pectoris: Secondary | ICD-10-CM | POA: Insufficient documentation

## 2017-11-05 DIAGNOSIS — I083 Combined rheumatic disorders of mitral, aortic and tricuspid valves: Secondary | ICD-10-CM | POA: Diagnosis not present

## 2017-11-05 MED ORDER — PERFLUTREN LIPID MICROSPHERE
1.0000 mL | INTRAVENOUS | Status: AC | PRN
  Administered 2017-11-05: 1 mL via INTRAVENOUS

## 2017-11-05 NOTE — Addendum Note (Signed)
Addended by: Crissie Sickles A on: 11/05/2017 08:37 AM   Modules accepted: Orders

## 2017-11-05 NOTE — Telephone Encounter (Signed)
Patient called back and has agreed to have the labs done.

## 2017-11-05 NOTE — Telephone Encounter (Signed)
Copied from CRM [id number]. Topic: Quick Communication - Office Called Patient >> Nov 05, 2017  8:33 AM Crissie Sickles, RMA wrote: Reason for CRM: Patient called insurance company to see about Diagnostic codes and also if her insurance company will pay for 5 test.  Please let me know if she is going to have the test done and what her insurance company says. Also we may need a diagnostic codes.   Please document her responses and send them in a CRM. Thanks  Nurse Triage may handle   >> Nov 05, 2017  9:53 AM Floria Raveling A wrote: Pt called in and stated she contacted ins compy and they would not provide her with the codes but they told her that most all labs are covered.  She would like for Dr Abner Greenspan to go ahead and order the labs that she needs to order for the hives.     Best number- [phone]    I have placed orders for the lab work

## 2017-11-05 NOTE — Addendum Note (Signed)
Addended by: Crissie Sickles A on: 11/05/2017 10:37 AM   Modules accepted: Orders

## 2017-11-05 NOTE — Telephone Encounter (Signed)
Spoke with patient she will cal her insurance company to see if they will pay for these tests and also see if they have a dx code for the test to be paid for.

## 2017-11-07 ENCOUNTER — Other Ambulatory Visit (INDEPENDENT_AMBULATORY_CARE_PROVIDER_SITE_OTHER): Payer: Medicare Other

## 2017-11-07 DIAGNOSIS — L509 Urticaria, unspecified: Secondary | ICD-10-CM

## 2017-11-07 LAB — SEDIMENTATION RATE: SED RATE: 12 mm/h (ref 0–30)

## 2017-11-08 LAB — ROCKY MTN SPOTTED FVR ABS PNL(IGG+IGM)
RMSF IgG: NOT DETECTED
RMSF IgM: NOT DETECTED

## 2017-11-08 LAB — B. BURGDORFI ANTIBODIES: B burgdorferi Ab IgG+IgM: <0.9 index

## 2017-11-08 LAB — EPSTEIN-BARR VIRUS NUCLEAR ANTIGEN ANTIBODY, IGG: EBV NA IgG: 268 U/mL — ABNORMAL HIGH

## 2017-11-08 NOTE — Telephone Encounter (Signed)
Referral placed for Kristina Lee.

## 2017-11-08 NOTE — Addendum Note (Signed)
Addended byConrad Dardanelle D on: 11/08/2017 01:43 PM   Modules accepted: Orders

## 2017-11-08 NOTE — Telephone Encounter (Signed)
Pt said [facility] cannot see her until December. She has seen Dr. Reginia Naas in [facility] before and would like referral sent to him. Pt states hives have come back strongly between knees to ankles. Please call pt to notify referral has been sent.  [address], [place], [place] [address] Phone [phone]

## 2017-11-10 LAB — HUMAN PARVOVIRUS DNA DETECTION BY PCR: PARVOVIRUS B19 DNA QL: NOT DETECTED

## 2017-11-15 ENCOUNTER — Ambulatory Visit: Payer: Self-pay | Admitting: *Deleted

## 2017-11-15 MED ORDER — METHYLPREDNISOLONE 4 MG PO TABS: ORAL_TABLET | ORAL | 0 refills | Status: DC

## 2017-11-15 NOTE — Telephone Encounter (Signed)
Patient needs to be seen somewhere if we cannot get her in at [facility] she will need to go be seen

## 2017-11-15 NOTE — Telephone Encounter (Signed)
Per [facility] RN triage note, routed to Dr. Abner Greenspan to advise on whether or not pt. Should be seen in the office today.

## 2017-11-15 NOTE — Telephone Encounter (Addendum)
Returned call to pt. to discuss hives.  Reported her hives have gotten much   worse again.  Described hives from ankles up the legs, and from wrists up the arms to shoulders, and on her back. Reported appearance is varying sizes from "tiny to large, and are red; some are blistering and painful."  Stated some of the larger hives have opened-up and are bleeding.  Stated there is extreme itching.  Reported her abdomen and chest itch, but no hives or rash are present in those areas.  Denied swelling of lips, tongue, or throat.  Denied fever.  C/o some chills, but has not checked temperature.   Reported she is taking Benadryl orally, and topical, Itch-Ex, and Calamine clear.  Scheduled to go to her Dermatologist, Reginia Naas, next [employer] @ [facility].  Reported she has been referred to [facility], but was not able to get an appt. Until December, so she is going back to her previous Dermatologist.  Asking for further recommendations from Dr. Abner Greenspan, until she can see her Dermatologist.   Advised pt. Will send this note to Dr. Abner Greenspan and call office to bring to her attention today.  Pt. agreed with plan.      (note that protocol was not used, as this condition has been documented and addressed previously)   Message from Gerrianne Scale sent at 11/15/2017 9:33 AM EDT   Summary: Hives for several weeks   Pt calling stating that she has an appt on next Thursday at a dermatologist and that she would like to know what she can use for the hives they itch hurt and bleeding she would like to know what she should do

## 2017-11-15 NOTE — Telephone Encounter (Signed)
Author phoned pt to f/u on hives and to advise on being seen by healthcare professional. No answer, author left detailed VM to call back notifying us of plan to #[phone].

## 2017-11-15 NOTE — Telephone Encounter (Signed)
Summary: Hives for several weeks   Pt calling stating that she has an appt on next Thursday at a dermatologist and that she would like to know what she can use for the hives they itch hurt and bleeding she would like to know what she should do     Called pt regarding her symptoms; she has had multiple medications, and 3 rounds of prednisone with no relief; the pt would like something for itching to cover her until her dermatology appointment on 11/21/17 at 1000;  Her previous referral for dermatology appointment not available until December 2019; however, she is going to regular dermatologist is next Thursday 11/21/17 at 1000; she reports that she has not seen an allergist; spoke with Windell Moulding at [facility], she says per [facility], the pt should continue current regime; pt disconnected before information can be relayed; attempted to call pt back but no answer; left message on voice mail to call office; if/when pt calls back please connect her to [place] or Windell Moulding at [phone]  Reason for Disposition . [1] MODERATE-SEVERE hives persist (i.e., hives interfere with normal activities or work) AND [2] taking antihistamine (e.g., Benadryl, Claritin) > 24 hours  Answer Assessment - Initial Assessment Questions 1. APPEARANCE: "What does the rash look like?"      Vary is size and appearance 2. LOCATION: "Where is the rash located?"      Legs, and arms 3. NUMBER: "How many hives are there?"      Multiple; too numerous to count 4. SIZE: "How big are the hives?" (inches, cm, compare to coins) "Do they all look the same or is there lots of variation in shape and size?"      Vary in shape and size 5. ONSET: "When did the hives begin?" (Hours or days ago)     11/13/17 6. ITCHING: "Does it itch?" If so, ask: "How bad is the itch?"    - MILD: doesn't interfere with normal activities   - MODERATE - SEVERE: interferes with work, school, sleep, or other activities     severe 7. RECURRENT PROBLEM: "Have you had hives  before?" If so, ask: "When was the last time?" and "What happened that time?"   yes, started 3rd week of April 2019 8. TRIGGERS: "Were you exposed to any new food, plant, cosmetic product or animal just before the hives began?"     no 9. OTHER SYMPTOMS: "Do you have any other symptoms?" (e.g., fever, tongue swelling, difficulty breathing, abdominal pain)     Itching on stomach and chest but no bumps noticed; some areas blistered 10. PREGNANCY: "Is there any chance you are pregnant?" "When was your last menstrual period?"       no  Protocols used: HIVES-A-AH

## 2017-11-15 NOTE — Telephone Encounter (Signed)
Received call from [facility] stating pt was returning CMA's call. Advised pt per Windell Moulding (CMA), that prednisone dose pack was called in.

## 2017-11-17 ENCOUNTER — Other Ambulatory Visit: Payer: Self-pay | Admitting: Family Medicine

## 2017-11-18 ENCOUNTER — Emergency Department (HOSPITAL_BASED_OUTPATIENT_CLINIC_OR_DEPARTMENT_OTHER)
Admission: EM | Admit: 2017-11-18 | Discharge: 2017-11-19 | Disposition: A | Discharge status: Home / Self Care | Payer: Medicare Other | Attending: Emergency Medicine | Admitting: Emergency Medicine

## 2017-11-18 ENCOUNTER — Other Ambulatory Visit: Payer: Self-pay

## 2017-11-18 ENCOUNTER — Encounter (HOSPITAL_BASED_OUTPATIENT_CLINIC_OR_DEPARTMENT_OTHER): Payer: Self-pay

## 2017-11-18 ENCOUNTER — Emergency Department (HOSPITAL_BASED_OUTPATIENT_CLINIC_OR_DEPARTMENT_OTHER): Payer: Medicare Other

## 2017-11-18 DIAGNOSIS — R6889 Other general symptoms and signs: Secondary | ICD-10-CM

## 2017-11-18 DIAGNOSIS — N183 Chronic kidney disease, stage 3 (moderate): Secondary | ICD-10-CM | POA: Insufficient documentation

## 2017-11-18 DIAGNOSIS — I2510 Atherosclerotic heart disease of native coronary artery without angina pectoris: Secondary | ICD-10-CM | POA: Insufficient documentation

## 2017-11-18 DIAGNOSIS — I129 Hypertensive chronic kidney disease with stage 1 through stage 4 chronic kidney disease, or unspecified chronic kidney disease: Secondary | ICD-10-CM | POA: Insufficient documentation

## 2017-11-18 DIAGNOSIS — E119 Type 2 diabetes mellitus without complications: Secondary | ICD-10-CM | POA: Insufficient documentation

## 2017-11-18 DIAGNOSIS — Z79899 Other long term (current) drug therapy: Secondary | ICD-10-CM | POA: Insufficient documentation

## 2017-11-18 DIAGNOSIS — Z8542 Personal history of malignant neoplasm of other parts of uterus: Secondary | ICD-10-CM | POA: Insufficient documentation

## 2017-11-18 DIAGNOSIS — Z7982 Long term (current) use of aspirin: Secondary | ICD-10-CM | POA: Diagnosis not present

## 2017-11-18 DIAGNOSIS — R0602 Shortness of breath: Secondary | ICD-10-CM | POA: Diagnosis not present

## 2017-11-18 DIAGNOSIS — R251 Tremor, unspecified: Secondary | ICD-10-CM | POA: Diagnosis present

## 2017-11-18 DIAGNOSIS — Z87891 Personal history of nicotine dependence: Secondary | ICD-10-CM | POA: Insufficient documentation

## 2017-11-18 DIAGNOSIS — R509 Fever, unspecified: Secondary | ICD-10-CM | POA: Diagnosis not present

## 2017-11-18 LAB — CBC WITH DIFFERENTIAL/PLATELET
BASOS PCT: 0 %
Basophils Absolute: 0 10*3/uL (ref 0.0–0.1)
Eosinophils Absolute: 0.1 10*3/uL (ref 0.0–0.7)
Eosinophils Relative: 1 %
HEMATOCRIT: 41.5 % (ref 36.0–46.0)
Hemoglobin: 14.4 g/dL (ref 12.0–15.0)
Lymphocytes Relative: 7 %
Lymphs Abs: 0.9 10*3/uL (ref 0.7–4.0)
MCH: 30.3 pg (ref 26.0–34.0)
MCHC: 34.7 g/dL (ref 30.0–36.0)
MCV: 87.2 fL (ref 78.0–100.0)
Monocytes Absolute: 1 10*3/uL (ref 0.1–1.0)
Monocytes Relative: 8 %
NEUTROS ABS: 10.1 10*3/uL — AB (ref 1.7–7.7)
Neutrophils Relative %: 84 %
Platelets: 218 10*3/uL (ref 150–400)
RBC: 4.76 MIL/uL (ref 3.87–5.11)
RDW: 13.9 % (ref 11.5–15.5)
WBC: 12 10*3/uL — ABNORMAL HIGH (ref 4.0–10.5)

## 2017-11-18 LAB — COMPREHENSIVE METABOLIC PANEL
ALT: 41 U/L (ref 0–44)
AST: 52 U/L — ABNORMAL HIGH (ref 15–41)
Albumin: 4.3 g/dL (ref 3.5–5.0)
Alkaline Phosphatase: 61 U/L (ref 38–126)
Anion gap: 12 (ref 5–15)
BILIRUBIN TOTAL: 1.1 mg/dL (ref 0.3–1.2)
BUN: 20 mg/dL (ref 8–23)
CALCIUM: 9.5 mg/dL (ref 8.9–10.3)
CO2: 23 mmol/L (ref 22–32)
Chloride: 104 mmol/L (ref 98–111)
Creatinine, Ser: 1.06 mg/dL — ABNORMAL HIGH (ref 0.44–1.00)
GFR calc Af Amer: 59 mL/min — ABNORMAL LOW (ref >60)
GFR calc non Af Amer: 51 mL/min — ABNORMAL LOW (ref >60)
GLUCOSE: 95 mg/dL (ref 70–99)
POTASSIUM: 3.2 mmol/L — AB (ref 3.5–5.1)
Sodium: 139 mmol/L (ref 135–145)
TOTAL PROTEIN: 7.8 g/dL (ref 6.5–8.1)

## 2017-11-18 LAB — TROPONIN I: Troponin I: <0.03 ng/mL (ref <0.03)

## 2017-11-18 LAB — BRAIN NATRIURETIC PEPTIDE: B Natriuretic Peptide: 99.2 pg/mL (ref 0.0–100.0)

## 2017-11-18 LAB — CK: CK TOTAL: 106 U/L (ref 38–234)

## 2017-11-18 LAB — D-DIMER, QUANTITATIVE (NOT AT ARMC): D DIMER QUANT: 0.54 ug{FEU}/mL — AB (ref 0.00–0.50)

## 2017-11-18 NOTE — ED Notes (Signed)
Patient transported to CT 

## 2017-11-18 NOTE — ED Provider Notes (Signed)
[facility] EMERGENCY DEPARTMENT Provider Note   CSN: [id number] Arrival date & time: 11/18/17  2205     History   Chief Complaint Chief Complaint  Patient presents with  . Tremors    HPI Kristina Lee is a 72 y.o. female.  Patient presents with sudden onset of total body "tremors" that onset this evening while she was getting ready for bed.  She states that she was shaking all over including her jaw, lips, arms and legs.  There is no tongue biting or incontinence.  She felt chills but did not check her temperature.  Symptoms resolved after about 30 minutes by the time she got to the ED.  She states the results with "getting warm".  She feels improved at this time.  She is very anxious because she recently had a heart attack 6 weeks ago in [place] with one stent in her LAD.  She states she had neck and jaw pain with this heart attack and none tonight.  Denies chest pain or shortness of breath.  Denies nausea, vomiting or diarrhea.  She denies any focal weakness, numbness or tingling.  No documented fever.  Has had a cough and rhinorrhea for the past several days.  No pain with urination or blood in the urine.  Is a diabetic but does not take medication for it.  She is had a poor appetite because she is struggled with a low-salt diet.  She has her first cardiac rehab apartment tomorrow.  She is currently on a course of prednisone for chronic hives that she has had since April.  She states she is tolerated prednisone in the past without a problem.  Her PCP called in Medrol Dosepak 3 days ago which she has not picked up yet.  She states she has 2 more days of prednisone.  The history is provided by the patient and a relative.    Past Medical History:  Diagnosis Date  . Diabetes mellitus without complication (HCC)   . Heart attack (HCC) 09/07/2017    Patient Active Problem List   Diagnosis Date Noted  . CAD (coronary artery disease) 09/25/2017  . Low back pain 09/25/2017    . Gastroesophageal reflux disease with esophagitis 09/04/2017  . Hyperlipidemia associated with type 2 diabetes mellitus (HCC) 12/31/2016  . Hypokalemia 10/28/2015  . DDD (degenerative disc disease), lumbosacral 09/13/2015  . Diverticulosis of large intestine 09/13/2015  . Seborrheic dermatitis of scalp 09/13/2015  . Chronic kidney disease, stage III (moderate) (HCC) 05/02/2015  . Type 2 diabetes mellitus without complication, without long-term current use of insulin (HCC) 01/18/2015  . Allergic rhinitis 07/14/2014  . Essential hypertension 07/14/2014  . History of uterine cancer 07/14/2014    Past Surgical History:  Procedure Laterality Date  . ABDOMINAL HYSTERECTOMY  2006   b/l SPO and TAH, uterine cancer  . EYE SURGERY Bilateral 1960  . TUBAL LIGATION       OB History   None      Home Medications    Prior to Admission medications   Medication Sig Start Date End Date Taking? Authorizing Provider  aspirin EC 81 MG tablet Take 81 mg by mouth daily.    [provider]  atorvastatin (LIPITOR) 80 MG tablet Take 80 mg by mouth daily.    [provider]  cetirizine (ZYRTEC) 10 MG tablet TAKE 1 TABLET BY MOUTH TWICE A DAY 10/31/17   Bradd Canary, MD  diphenhydrAMINE (BENADRYL) 2 % cream Apply topically 3 (three)  times daily as needed for itching.    [provider]  lisinopril (PRINIVIL,ZESTRIL) 2.5 MG tablet Take 2.5 mg by mouth daily.    [provider]  methylPREDNISolone (MEDROL) 4 MG tablet 5 tab po qd X 1d then 4 tab po qd X 1d then 3 tab po qd X 1d then 2 tab po qd then 1 tab po qd 11/15/17   Bradd Canary, MD  ranitidine (ZANTAC) 150 MG capsule TAKE 1 CAPSULE BY MOUTH TWICE A DAY 11/18/17   Bradd Canary, MD  ticagrelor (BRILINTA) 90 MG TABS tablet Take 90 mg by mouth 2 (two) times daily.     [provider]  tretinoin (RETIN-A) 0.1 % cream Apply topically at bedtime.    [provider]    Family History Family  History  Problem Relation Age of Onset  . Cancer Mother        liver cancer, breast cancer x 2   . Alcohol abuse Mother        smoker  . Heart disease Father        MI  . Alcohol abuse Father        smoker  . Depression Sister   . Tremor Sister   . Cancer Brother        pancreatic  . Diabetes Maternal Grandmother   . Hearing loss Maternal Grandmother   . Depression Maternal Grandmother     Social History Social History   Tobacco Use  . Smoking status: Former Games developer  . Smokeless tobacco: Never Used  Substance Use Topics  . Alcohol use: Not Currently  . Drug use: Not Currently     Allergies   Amoxicillin; Hydroxyzine; and Sulfa antibiotics   Review of Systems Review of Systems  Constitutional: Positive for activity change and appetite change. Negative for fatigue and fever.  HENT: Negative for congestion and rhinorrhea.   Eyes: Negative for visual disturbance.  Respiratory: Negative for cough, chest tightness and shortness of breath.   Cardiovascular: Negative for chest pain.  Gastrointestinal: Negative for abdominal pain, nausea and vomiting.  Genitourinary: Negative for dysuria, hematuria, vaginal bleeding and vaginal discharge.  Musculoskeletal: Negative for arthralgias and myalgias.  Skin: Negative for wound.  Neurological: Positive for tremors and weakness. Negative for dizziness, light-headedness, numbness and headaches.   all other systems are negative except as noted in the HPI and PMH.     Physical Exam Updated Vital Signs BP (!) 154/81 (BP Location: Right Arm)   Pulse 95   Temp 98.3 F (36.8 C) (Oral)   Resp 18   Ht 5\' 2"  (1.575 m)   Wt 64.4 kg (142 lb)   SpO2 99%   BMI 25.97 kg/m   Physical Exam  Constitutional: She is oriented to person, place, and time. She appears well-developed and well-nourished. No distress.  Appears anxious, no noted tremors  HENT:  Head: Normocephalic and atraumatic.  Mouth/Throat: Oropharynx is clear and moist. No  oropharyngeal exudate.  No tongue or lip swelling  Eyes: Pupils are equal, round, and reactive to light. Conjunctivae and EOM are normal.  Neck: Normal range of motion. Neck supple.  No meningismus.  Cardiovascular: Normal rate, regular rhythm, normal heart sounds and intact distal pulses.  No murmur heard. Pulmonary/Chest: Effort normal and breath sounds normal. No respiratory distress. She has no wheezes. She exhibits no tenderness.  Abdominal: Soft. There is no tenderness. There is no rebound and no guarding.  Musculoskeletal: Normal range of motion. She exhibits  no edema or tenderness.  Neurological: She is alert and oriented to person, place, and time. No cranial nerve deficit. She exhibits normal muscle tone. Coordination normal.  No ataxia on finger to nose bilaterally. No pronator drift. 5/5 strength throughout. CN 2-12 intact.Equal grip strength. Sensation intact.   Skin: Skin is warm. Capillary refill takes less than 2 seconds. Rash noted.  Chronic urticarial rash to lower extremities  Psychiatric: She has a normal mood and affect. Her behavior is normal.  Nursing note and vitals reviewed.    ED Treatments / Results  Labs (all labs ordered are listed, but only abnormal results are displayed) Labs Reviewed  CBC WITH DIFFERENTIAL/PLATELET - Abnormal; Notable for the following components:      Result Value   WBC 12.0 (*)    Neutro Abs 10.1 (*)    All other components within normal limits  COMPREHENSIVE METABOLIC PANEL - Abnormal; Notable for the following components:   Potassium 3.2 (*)    Creatinine, Ser 1.06 (*)    AST 52 (*)    GFR calc non Af Amer 51 (*)    GFR calc Af Amer 59 (*)    All other components within normal limits  D-DIMER, QUANTITATIVE (NOT AT [facility]) - Abnormal; Notable for the following components:   D-Dimer, Quant 0.54 (*)    All other components within normal limits  GROUP A STREP BY PCR  CULTURE, BLOOD (ROUTINE X 2)  CULTURE, BLOOD (ROUTINE X 2)    URINE CULTURE  TROPONIN I  BRAIN NATRIURETIC PEPTIDE  URINALYSIS, ROUTINE W REFLEX MICROSCOPIC  CK  TROPONIN I  I-STAT CG4 LACTIC ACID, ED  I-STAT CG4 LACTIC ACID, ED    EKG EKG Interpretation  Date/Time:  Monday November 18 2017 22:16:37 EDT Ventricular Rate:  95 PR Interval:  144 QRS Duration: 74 QT Interval:  304 QTC Calculation: 382 R Axis:   52 Text Interpretation:  Normal sinus rhythm with sinus arrhythmia Nonspecific T wave abnormality Abnormal ECG Interpretation limited secondary to artifact No old tracing to compare Confirmed by Pricilla Loveless [phone]) on 11/18/2017 10:49:18 PM   Radiology Dg Chest 2 View  Result Date: 11/18/2017 CLINICAL DATA:  Acute onset of shaking. EXAM: CHEST - 2 VIEW COMPARISON:  None. FINDINGS: The lungs are well-aerated. Mild left basilar atelectasis is noted. There is no evidence of pleural effusion or pneumothorax. The heart is normal in size; the mediastinal contour is within normal limits. No acute osseous abnormalities are seen. IMPRESSION: Mild left basilar atelectasis noted; lungs otherwise clear. Electronically Signed   By: Roanna Raider M.D.   On: 11/18/2017 23:39   Ct Angio Chest Pe W And/or Wo Contrast  Result Date: 11/19/2017 CLINICAL DATA:  72 y/o  F; shortness of breath and elevated D-dimer. EXAM: CT ANGIOGRAPHY CHEST WITH CONTRAST TECHNIQUE: Multidetector CT imaging of the chest was performed using the standard protocol during bolus administration of intravenous contrast. Multiplanar CT image reconstructions and MIPs were obtained to evaluate the vascular anatomy. CONTRAST:  100 cc Isovue 370 COMPARISON:  11/18/2017 chest radiograph. FINDINGS: Cardiovascular: Satisfactory opacification of the pulmonary arteries to the segmental level. No evidence of pulmonary embolism. Normal heart size. No pericardial effusion. Moderate coronary and aortic calcific atherosclerosis. Mediastinum/Nodes: No enlarged mediastinal, hilar, or axillary lymph nodes.  Thyroid gland, trachea, and esophagus demonstrate no significant findings. Lungs/Pleura: Lungs are clear. No pleural effusion or pneumothorax. Upper Abdomen: No acute abnormality. Musculoskeletal: No chest wall abnormality. Mild multilevel discogenic degenerative changes of the thoracic spine.  Review of the MIP images confirms the above findings. IMPRESSION: 1. No pulmonary embolus identified.  Clear lungs. 2. Moderate coronary and aortic calcific atherosclerosis. 3. Mild degenerative changes of the thoracic spine. Electronically Signed   By: Mitzi Hansen M.D.   On: 11/19/2017 02:13    Procedures Procedures (including critical care time)  Medications Ordered in ED Medications - No data to display   Initial Impression / Assessment and Plan / ED Course  I have reviewed the triage vital signs and the nursing notes.  Pertinent labs & imaging results that were available during my care of the patient were reviewed by me and considered in my medical decision making (see chart for details).    Generalized tremors. No CP or SOB.  Recent MI 6 weeks ago in [place].  Has been on prednisone multiple times for recurrent urticaria but tolerated this without a problem in the past.  Patient found to be febrile 101.8.  This is likely the source of her shaking tremors.  EKG is sinus rhythm.  No previous available.  Septic work-up pursued.  Lactate is normal.  Urinalysis is negative.  Chest x-ray shows mild atelectasis. WBC 12. Rapid strep negative.  Troponin negative x2.  No evidence of pneumonia or pulmonary embolism on CT scan.  However patient has been on multiple courses of steroids recently.  Patient's fever may be due to viral syndrome. Blood and urine cultures sent.  Patient tolerating p.o. and ambulatory.  Fever has resolved.  Blood pressure and heart rate remained stable.  No clear source of infection based on work-up so far.  Will hold empiric antibiotics.  Follow-up with PCP.   Return precautions discussed including chest pain, shortness of breath, persistent fever or any other concerns.  Final Clinical Impressions(s) / ED Diagnoses   Final diagnoses:  Fever, unspecified fever cause  Rigors    ED Discharge Orders    None       Latonia Conrow, Jeannett Senior, MD 11/19/17 [phone]

## 2017-11-18 NOTE — ED Triage Notes (Signed)
Pt presents with tremors and multiple complaints. Hx of MI 6 weeks ago. Pt very anxious at triage.

## 2017-11-19 ENCOUNTER — Telehealth: Payer: Self-pay

## 2017-11-19 ENCOUNTER — Telehealth: Payer: Self-pay | Admitting: Cardiovascular Disease

## 2017-11-19 ENCOUNTER — Emergency Department (HOSPITAL_BASED_OUTPATIENT_CLINIC_OR_DEPARTMENT_OTHER): Payer: Medicare Other

## 2017-11-19 LAB — URINALYSIS, ROUTINE W REFLEX MICROSCOPIC
Bilirubin Urine: NEGATIVE
Glucose, UA: NEGATIVE mg/dL
Hgb urine dipstick: NEGATIVE
Ketones, ur: NEGATIVE mg/dL
Leukocytes, UA: NEGATIVE
NITRITE: NEGATIVE
PH: 6 (ref 5.0–8.0)
Protein, ur: NEGATIVE mg/dL
Specific Gravity, Urine: 1.01 (ref 1.005–1.030)

## 2017-11-19 LAB — TROPONIN I: Troponin I: <0.03 ng/mL (ref <0.03)

## 2017-11-19 LAB — I-STAT CG4 LACTIC ACID, ED: Lactic Acid, Venous: 1.26 mmol/L (ref 0.5–1.9)

## 2017-11-19 LAB — GROUP A STREP BY PCR: GROUP A STREP BY PCR: NOT DETECTED

## 2017-11-19 MED ORDER — SODIUM CHLORIDE 0.9 % IV BOLUS
500.0000 mL | Freq: Once | INTRAVENOUS | Status: AC
  Administered 2017-11-19: 500 mL via INTRAVENOUS

## 2017-11-19 MED ORDER — ACETAMINOPHEN 325 MG PO TABS
650.0000 mg | ORAL_TABLET | Freq: Once | ORAL | Status: AC
  Administered 2017-11-19: 650 mg via ORAL
  Filled 2017-11-19: qty 2

## 2017-11-19 MED ORDER — IOPAMIDOL (ISOVUE-370) INJECTION 76%
100.0000 mL | Freq: Once | INTRAVENOUS | Status: AC | PRN
  Administered 2017-11-19: 100 mL via INTRAVENOUS

## 2017-11-19 NOTE — Telephone Encounter (Signed)
New Message:    Pt is scheduled for Cardiac Rehab today at 9:00-she needs an order asap please. Please fax [phone].

## 2017-11-19 NOTE — Discharge Instructions (Addendum)
Follow-up with your doctor for an appointment this week.  There is no evidence of urinary tract infection, pneumonia or strep throat.  Your fever is likely coming from a virus.  Return to the ED if you are not eating, not drinking, develop chest pain, shortness of breath or any other concerns.

## 2017-11-19 NOTE — Telephone Encounter (Signed)
Called patient to schedule ED follow up appointment. States she does not need appointment and will just come for her appointment that she has scheduled for 12/2017. Advised to call office if anything changes. Patient agreed.

## 2017-11-19 NOTE — Telephone Encounter (Signed)
Left message for [facility], faxed referral to [phone].  Advised o call back if needed.

## 2017-11-20 LAB — URINE CULTURE: CULTURE: NO GROWTH

## 2017-11-24 LAB — CULTURE, BLOOD (ROUTINE X 2)
CULTURE: NO GROWTH
Culture: NO GROWTH
Special Requests: ADEQUATE
Special Requests: ADEQUATE

## 2017-12-19 ENCOUNTER — Other Ambulatory Visit: Payer: Self-pay | Admitting: Family Medicine

## 2017-12-23 ENCOUNTER — Encounter: Payer: Self-pay | Admitting: Pediatrics

## 2017-12-23 ENCOUNTER — Ambulatory Visit: Payer: Medicare Other | Admitting: Pediatrics

## 2017-12-23 VITALS — BP 126/72 | HR 68 | Temp 97.8°F | Resp 16 | Ht 61.73 in | Wt 141.2 lb

## 2017-12-23 DIAGNOSIS — Z8542 Personal history of malignant neoplasm of other parts of uterus: Secondary | ICD-10-CM

## 2017-12-23 DIAGNOSIS — I252 Old myocardial infarction: Secondary | ICD-10-CM

## 2017-12-23 DIAGNOSIS — L2084 Intrinsic (allergic) eczema: Secondary | ICD-10-CM

## 2017-12-23 DIAGNOSIS — E119 Type 2 diabetes mellitus without complications: Secondary | ICD-10-CM

## 2017-12-23 DIAGNOSIS — R21 Rash and other nonspecific skin eruption: Secondary | ICD-10-CM | POA: Insufficient documentation

## 2017-12-23 DIAGNOSIS — L500 Allergic urticaria: Secondary | ICD-10-CM | POA: Insufficient documentation

## 2017-12-23 DIAGNOSIS — I10 Essential (primary) hypertension: Secondary | ICD-10-CM

## 2017-12-23 DIAGNOSIS — R05 Cough: Secondary | ICD-10-CM

## 2017-12-23 DIAGNOSIS — E1169 Type 2 diabetes mellitus with other specified complication: Secondary | ICD-10-CM

## 2017-12-23 DIAGNOSIS — I219 Acute myocardial infarction, unspecified: Secondary | ICD-10-CM

## 2017-12-23 DIAGNOSIS — J301 Allergic rhinitis due to pollen: Secondary | ICD-10-CM

## 2017-12-23 DIAGNOSIS — R059 Cough, unspecified: Secondary | ICD-10-CM | POA: Insufficient documentation

## 2017-12-23 HISTORY — DX: Type 2 diabetes mellitus without complications: E11.9

## 2017-12-23 HISTORY — DX: Allergic urticaria: L50.0

## 2017-12-23 HISTORY — DX: Acute myocardial infarction, unspecified: I21.9

## 2017-12-23 HISTORY — DX: Intrinsic (allergic) eczema: L20.84

## 2017-12-23 MED ORDER — FLUTICASONE PROPIONATE 50 MCG/ACT NA SUSP: NASAL | 5 refills | Status: DC

## 2017-12-23 NOTE — Progress Notes (Signed)
[address] [place] [address] Dept: [phone]  New Patient Note  Patient ID: Kristina Lee, female    DOB: 04-Jan-1946  Age: 72 y.o. MRN: [id number] Date of Office Visit: 12/23/2017 Referring provider: Reginia Naas, MD [address] [place], [place] [address]    Chief Complaint: Urticaria (started in April 2019)  HPI Kristina Lee presents for evaluation of an urticarial rash which began in April of this year.  Initially the rash went from head to toe but more recently has been located mainly on her legs.  She has seen a dermatologist for this rash Dr. Katrinka Blazing who referred her for an allergy evaluation.  She has had about 3 courses of prednisone with some improvement.  She has been using Sarna lotion mixed with triamcinolone 0.1% twice a day to the red areas of the rash.  Sometimes she uses Sarna lotion 3 times a day.  She has been on Claritin 10 mg - 3 tablets daily.  In May she was prescribed ranitidine 150 mg twice a day for heartburn but the chest pain became really a heart attack and she had to have stents placed in May 2019.  She has had nasal allergic symptoms for several years and had been using Allegra 180 mg once a day.  She has had a tonsillectomy.  She was diagnosed with atopic dermatitis by her dermatologist Dr. Katrinka Blazing  On August 5 of this year, she had high fever and severe chills.  She went to an urgent care facility All lab work was unremarkable.  She was told she had a virus.  Since then she has had a cough but no wheezing or shortness of breath.  She is able to exercise without any shortness of breath.  She has a history of rhinitis in the past treated with Allegra 180 mg once a day.  She has been having some nasal congestion since August , and a postnasal drainage.  She has few spots in the back of her throat which have become itchy at times and make her cough  Review of Systems  Constitutional: Negative.   HENT:       Rhinitis for several years treated  with Allegra/since August she has had some tickling of her throat and some itching causing a cough.  History of a tonsillectomy  Eyes:       2 surgeries for amblyopia in the past  Respiratory:       Coughing since a severe viral infection in August of this year  Cardiovascular:       Hypertension.  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  Better on prednisone  Neurological: Negative.   Endo/Heme/Allergies:       No thyroid disease.  Adult onset diabetes controlled by diet and exercise  Psychiatric/Behavioral: Negative.     Outpatient Encounter Medications as of 12/23/2017  Medication Sig  . aspirin EC 81 MG tablet Take 81 mg by mouth daily.  Marland Kitchen atorvastatin (LIPITOR) 80 MG tablet Take 80 mg by mouth daily.  Marland Kitchen augmented betamethasone dipropionate (DIPROLENE-AF) 0.05 % cream APPLY TOPICALLY 2(TWO) TIMES A DAY FOR UP TO 2 WEEKS  .  Calcium Carbonate-Vit D-Min (CALCIUM 600+D PLUS MINERALS) 600-400 MG-UNIT TABS Take 1 tablet by mouth Two (2) times a day.  . camphor-menthol (SARNA) lotion Apply 1 application topically as needed for itching.  . clobetasol cream (TEMOVATE) 0.05 % Apply 1 application topically daily as needed.  . diphenhydrAMINE (BENADRYL) 2 % cream Apply topically 3 (three) times daily as needed for itching.  . ezetimibe (ZETIA) 10 MG tablet Take by mouth.  . fexofenadine (ALLEGRA) 180 MG tablet Take 180 mg by mouth at bedtime.  Marland Kitchen glucose blood (ONE TOUCH ULTRA TEST) test strip Check blood sugar once daily. Dx E11.9  . ketoconazole (NIZORAL) 2 % shampoo SHAMPOO HAIR/SCALP AS DIRECTED  . Lancets 30G MISC Check blood sugar 3 times a day Dx E11.9  . lisinopril (PRINIVIL,ZESTRIL) 2.5 MG tablet Take 2.5 mg by mouth daily.  Marland Kitchen loratadine  (CLARITIN) 10 MG tablet 30 mg daily before breakfast.   . meloxicam (MOBIC) 15 MG tablet daily as needed for pain.   . methocarbamol (ROBAXIN) 500 MG tablet   . potassium chloride (K-DUR) 10 MEQ tablet TAKE 1 TABLET BY MOUTH TWO  TIMES DAILY  . Propylene Glycol 0.6 % SOLN Apply to eye.  . ranitidine (ZANTAC) 150 MG capsule TAKE 1 CAPSULE BY MOUTH TWICE A DAY  . ticagrelor (BRILINTA) 90 MG TABS tablet Take 90 mg by mouth 2 (two) times daily.   Marland Kitchen tretinoin (RETIN-A) 0.1 % cream Apply topically at bedtime.  . triamcinolone cream (KENALOG) 0.1 % APPLY TO AFFECTED AREA TWICE A DAY AS NEEDED  . triamterene-hydrochlorothiazide (MAXZIDE-25) 37.5-25 MG tablet TAKE 1 TABLET BY MOUTH  DAILY  . [DISCONTINUED] cetirizine (ZYRTEC) 10 MG tablet TAKE 1 TABLET BY MOUTH TWICE A DAY  . [DISCONTINUED] dexamethasone (DECADRON) 0.75 MG tablet TAKE 2 TABLETS BY MOUTH EVERY MORNING FOR 1 WEEK THEN 1 TABLET EVERY MORNING X 7 DAYS  . [DISCONTINUED] glucose blood (ONE TOUCH ULTRA TEST) test strip CHECK BLOOD SUGAR ONCE  DAILY.  . [DISCONTINUED] Lancets 30G MISC Check blood sugar 3 times a day Dx E11.9  . [DISCONTINUED] methylPREDNISolone (MEDROL) 4 MG tablet 5 tab po qd X 1d then 4 tab po qd X 1d then 3 tab po qd X 1d then 2 tab po qd then 1 tab po qd  . fluticasone (FLONASE) 50 MCG/ACT nasal spray Two sprays each nostril once a day if needed for stuffy nose.   No facility-administered encounter medications on file as of 12/23/2017.      Drug Allergies:  Allergies  Allergen Reactions  . Amoxicillin Anaphylaxis  . Hydroxyzine Itching and Other (See Comments)    Other reaction(s): Other (See Comments) Sedation Sedation Sedation   . Sulfa Antibiotics Swelling    Other reaction(s): Swelling tongue tongue tongue   . Statins Other (See Comments)    Pt chooses not to take due to side effects. Pt chooses not to take due to side effects.  Pt chooses not to take due to side effects.    Family History: Avanelle's  family history includes Alcohol abuse in her father and mother; Cancer in her brother and mother; Depression in her maternal grandmother and sister; Diabetes in her maternal grandmother; Hearing loss in her maternal grandmother; Heart disease in her father; Tremor in her sister..  Family history is negative for asthma, hayfever, sinus problems, angioedema, eczema, hives, food allergies, lupus.  A grandfather who was a non-smoker had emphysema  Social and environmental.  She has a dog and 2 cats in the home.  She is not exposed to cigarette smoking.  She quit smoking cigarettes in 1989.  She has smoked less than a pack a day for the previous 20 years.  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  Her symptoms are not worse when she paints.  Physical Exam: BP 126/72 (BP Location: Left Arm, Patient Position: Sitting, Cuff Size: Normal)   Pulse 68   Temp 97.8 F (36.6 C) (Oral)   Resp 16   Ht 5' 1.73" (1.568 m)   Wt 141 lb 3.2 oz (64 kg)   SpO2 97%   BMI 26.05 kg/m    Physical Exam  Constitutional: She is oriented to person, place, and time. She appears well-developed and well-nourished.  HENT:  Eyes normal.  Ears normal.  Nose mild swelling of nasal turbinates.  Pharynx normal.  Neck: Neck supple. No thyromegaly present.  Cardiovascular:  S1-S2 normal no murmurs  Pulmonary/Chest:  Clear to percussion and auscultation  Abdominal: Soft. There is no tenderness.  No hepatosplenomegaly  Lymphadenopathy:    She has no cervical adenopathy.  Neurological: She is alert and oriented to person, place, and time.  Skin:  Erythematous papulosquamous eruption in the lower legs  Psychiatric: She has a normal mood and affect. Her behavior is normal. Judgment and thought content normal.  Vitals reviewed.   Diagnostics: Allergy skin did not show a significant allergy to a food..  She had positive skin test to ragweed and mild reactivity to common molds  FVC 2.09 L FEV1 1.75 L.  Predicted FVC 2.66 L predicted FEV1  2.00 L-the spirometry is in the normal range  Assessment  Assessment and Plan: 1. Allergic urticaria   2. Seasonal allergic rhinitis due to pollen   3. Cough   4. Essential hypertension   5. History of uterine cancer   6. History of myocardial infarction in last year   7. Type 2 diabetes mellitus with other specified complication, without long-term current use of insulin (HCC)   8. Intrinsic atopic dermatitis     Meds ordered this encounter  Medications  . fluticasone (FLONASE) 50 MCG/ACT nasal spray    Sig: Two sprays each nostril once a day if needed for stuffy nose.    Dispense:  16 g    Refill:  5    Patient Instructions  Environmental control of dust and mold Allegra 180 mg-take 1 tablet in the morning for runny nose or itching.  If you are itching at night add Benadryl 25 mg - 1 capsule at night See if this works better than 3 Claritin sent in the morning Fluticasone 2 sprays per nostril once a day if needed for stuffy nose Ranitidine 150 mg-take 1 tablet twice a day-it may hives-this is an H2 antihistamine Do foods with salicylates make you itch? Continue on your other medications Call us if you are not doing well on this treatment plan Triamcinolone 0.1% cream twice a day if needed to red itchy areas below the face as prescribed by your dermatologist .  Mix it with Sarna lotion twice a day as prescribed Add prednisone 10 mg twice a day for 4 days, 10 mg on the fifth day the day after you see Dr. Abner Greenspan.  Did prednisone help the cough and or  the rash If you do not improve you  may need a skin biopsy .  Keep your follow-up appointments with Dr. Katrinka Blazing     Return in about 4 weeks (around 01/20/2018).   Thank you for the opportunity  to care for this patient.  Please do not hesitate to contact me with questions.  Penne Lash, M.D.  [facility] [address] [place], [place] [address] [phone]

## 2017-12-23 NOTE — Patient Instructions (Addendum)
Environmental control of dust and mold Allegra 180 mg-take 1 tablet in the morning for runny nose or itching.  If you are itching at night add Benadryl 25 mg - 1 capsule at night See if this works better than 3 Claritin sent in the morning Fluticasone 2 sprays per nostril once a day if needed for stuffy nose Ranitidine 150 mg-take 1 tablet twice a day-it may hives-this is an H2 antihistamine Do foods with salicylates make you itch? Continue on your other medications Call us if you are not doing well on this treatment plan Triamcinolone 0.1% cream twice a day if needed to red itchy areas below the face as prescribed by your dermatologist .  Mix it with Sarna lotion twice a day as prescribed Add prednisone 10 mg twice a day for 4 days, 10 mg on the fifth day the day after you see Dr. Abner Greenspan.  Did prednisone help the cough and or  the rash If you do not improve you  may need a skin biopsy .  Keep your follow-up appointments with Dr. Katrinka Blazing

## 2017-12-26 ENCOUNTER — Ambulatory Visit: Payer: Medicare Other | Admitting: Family Medicine

## 2017-12-26 ENCOUNTER — Encounter: Payer: Self-pay | Admitting: Family Medicine

## 2017-12-26 DIAGNOSIS — I10 Essential (primary) hypertension: Secondary | ICD-10-CM

## 2017-12-26 DIAGNOSIS — E785 Hyperlipidemia, unspecified: Secondary | ICD-10-CM

## 2017-12-26 DIAGNOSIS — E1169 Type 2 diabetes mellitus with other specified complication: Secondary | ICD-10-CM

## 2017-12-26 DIAGNOSIS — E119 Type 2 diabetes mellitus without complications: Secondary | ICD-10-CM

## 2017-12-26 LAB — CBC
HCT: 41.4 % (ref 36.0–46.0)
Hemoglobin: 14.3 g/dL (ref 12.0–15.0)
MCHC: 34.5 g/dL (ref 30.0–36.0)
MCV: 86.3 fl (ref 78.0–100.0)
Platelets: 214 10*3/uL (ref 150.0–400.0)
RBC: 4.8 Mil/uL (ref 3.87–5.11)
RDW: 13.7 % (ref 11.5–15.5)
WBC: 9.9 10*3/uL (ref 4.0–10.5)

## 2017-12-26 LAB — COMPREHENSIVE METABOLIC PANEL
ALK PHOS: 61 U/L (ref 39–117)
ALT: 28 U/L (ref 0–35)
AST: 36 U/L (ref 0–37)
Albumin: 4.2 g/dL (ref 3.5–5.2)
BILIRUBIN TOTAL: 1.2 mg/dL (ref 0.2–1.2)
BUN: 17 mg/dL (ref 6–23)
CO2: 27 mEq/L (ref 19–32)
Calcium: 9.9 mg/dL (ref 8.4–10.5)
Chloride: 106 mEq/L (ref 96–112)
Creatinine, Ser: 0.83 mg/dL (ref 0.40–1.20)
GFR: 71.76 mL/min (ref >60.00)
GLUCOSE: 94 mg/dL (ref 70–99)
Potassium: 3.5 mEq/L (ref 3.5–5.1)
SODIUM: 143 meq/L (ref 135–145)
TOTAL PROTEIN: 7.2 g/dL (ref 6.0–8.3)

## 2017-12-26 LAB — LIPID PANEL
Cholesterol: 92 mg/dL (ref 0–200)
HDL: 33.1 mg/dL — ABNORMAL LOW (ref >39.00)
LDL Cholesterol: 41 mg/dL (ref 0–99)
NONHDL: 58.66
Total CHOL/HDL Ratio: 3
Triglycerides: 86 mg/dL (ref 0.0–149.0)
VLDL: 17.2 mg/dL (ref 0.0–40.0)

## 2017-12-26 LAB — TSH: TSH: 0.61 u[IU]/mL (ref 0.35–4.50)

## 2017-12-26 LAB — HEMOGLOBIN A1C: HEMOGLOBIN A1C: 6.2 % (ref 4.6–6.5)

## 2017-12-26 MED ORDER — TICAGRELOR 90 MG PO TABS: 90.0000 mg | ORAL_TABLET | Freq: Two times a day (BID) | ORAL | 1 refills | Status: DC

## 2017-12-26 MED ORDER — ATORVASTATIN CALCIUM 80 MG PO TABS: 80.0000 mg | ORAL_TABLET | Freq: Every day | ORAL | 1 refills | Status: DC

## 2017-12-26 NOTE — Assessment & Plan Note (Addendum)
Increase leafy greens, consider increased lean red meat and using cast iron cookware. Continue to monitor, report any concerns. Tolerating Atorvastatin

## 2017-12-26 NOTE — Assessment & Plan Note (Signed)
Well controlled, no changes to meds. Encouraged heart healthy diet such as the DASH diet and exercise as tolerated.  °

## 2017-12-26 NOTE — Patient Instructions (Addendum)
Consider stopping the Lisinopril for one month to see if the hives and/or cough get better  [employer] Blood pressure cuff Hives Hives (urticaria) are itchy, red, swollen areas on your skin. Hives can appear on any part of your body and can vary in size. They can be as small as the tip of a pen or much larger. Hives often fade within 24 hours (acute hives). In other cases, new hives appear after old ones fade. This cycle can continue for several days or weeks (chronic hives). Hives result from your body's reaction to an irritant or to something that you are allergic to (trigger). When you are exposed to a trigger, your body releases a chemical (histamine) that causes redness, itching, and swelling. You can get hives immediately after being exposed to a trigger or hours later. Hives do not spread from person to person (are not contagious). Your hives may get worse with scratching, exercise, and emotional stress. What are the causes? Causes of this condition include:  Allergies to certain foods or ingredients.  Insect bites or stings.  Exposure to pollen or pet dander.  Contact with latex or chemicals.  Spending time in sunlight, heat, or cold (exposure).  Exercise.  Stress.  You can also get hives from some medical conditions and treatments. These include:  Viruses, including the common cold.  Bacterial infections, such as urinary tract infections and strep throat.  Disorders such as vasculitis, lupus, or thyroid disease.  Certain medications.  Allergy shots.  Blood transfusions.  Sometimes, the cause of hives is not known (idiopathic hives). What increases the risk? This condition is more likely to develop in:  Women.  People who have food allergies, especially to citrus fruits, milk, eggs, peanuts, tree nuts, or shellfish.  People who are allergic to: ? Medicines. ? Latex. ? Insects. ? Animals. ? Pollen.  People who have certain medical conditions, includinglupus or  thyroid disease.  What are the signs or symptoms? The main symptom of this condition is raised, itchyred or white bumps or patches on your skin. These areas may:  Become large and swollen (welts).  Change in shape and location, quickly and repeatedly.  Be separate hives or connect over a large area of skin.  Sting or become painful.  Turn white when pressed in the center (blanch).  In severe cases, yourhands, feet, and face may also become swollen. This may occur if hives develop deeper in your skin. How is this diagnosed? This condition is diagnosed based on your symptoms, medical history, and physical exam. Your skin, urine, or blood may be tested to find out what is causing your hives and to rule out other health issues. Your health care provider may also remove a small sample of skin from the affected area and examine it under a microscope (biopsy). How is this treated? Treatment depends on the severity of your condition. Your health care provider may recommend using cool, wet cloths (cool compresses) or taking cool showers to relieve itching. Hives are sometimes treated with medicines, including:  Antihistamines.  Corticosteroids.  Antibiotics.  An injectable medicine (omalizumab). Your health care provider may prescribe this if you have chronic idiopathic hives and you continue to have symptoms even after treatment with antihistamines.  Severe cases may require an emergency injection of adrenaline (epinephrine) to prevent a life-threatening allergic reaction (anaphylaxis). Follow these instructions at home: Medicines  Take or apply over-the-counter and prescription medicines only as told by your health care provider.  If you were prescribed an  antibiotic medicine, use it as told by your health care provider. Do not stop taking the antibiotic even if you start to feel better. Skin Care  Apply cool compresses to the affected areas.  Do not scratch or rub your  skin. General instructions  Do not take hot showers or baths. This can make itching worse.  Do not wear tight-fitting clothing.  Use sunscreen and wear protective clothing when you are outside.  Avoid any substances that cause your hives. Keep a journal to help you track what causes your hives. Write down: ? What medicines you take. ? What you eat and drink. ? What products you use on your skin.  Keep all follow-up visits as told by your health care provider. This is important. Contact a health care provider if:  Your symptoms are not controlled with medicine.  Your joints are painful or swollen. Get help right away if:  You have a fever.  You have pain in your abdomen.  Your tongue or lips are swollen.  Your eyelids are swollen.  Your chest or throat feels tight.  You have trouble breathing or swallowing. These symptoms may represent a serious problem that is an emergency. Do not wait to see if the symptoms will go away. Get medical help right away. Call your local emergency services (911 in the [place] Do not drive yourself to the hospital. This information is not intended to replace advice given to you by your health care provider. Make sure you discuss any questions you have with your health care provider. Document Released: 04/02/2005 Document Revised: 08/31/2015 Document Reviewed: 01/19/2015 Elsevier Interactive Patient Education  2018 [employer].

## 2017-12-26 NOTE — Assessment & Plan Note (Signed)
hgba1c acceptable, minimize simple carbs. Increase exercise as tolerated. Continue current meds 

## 2017-12-29 NOTE — Progress Notes (Signed)
Subjective:    Patient ID: Kristina Lee, female    DOB: 1946/04/03, 72 y.o.   MRN: [id number]  No chief complaint on file.   HPI Patient is in today for follow up. She had 4 days of watery diarrhea but that stopped several days since then and there has been no bowel movement since. She notes her hives have gotten better with less itching and less rash. She has had trouble with hives off and on since May. Denies CP/palp/SOB/HA/congestion/fevers or GU c/o. Taking meds as prescribed  Past Medical History:  Diagnosis Date  . Diabetes mellitus without complication (HCC)   . Heart attack (HCC) 09/07/2017  . Urticaria     Past Surgical History:  Procedure Laterality Date  . ABDOMINAL HYSTERECTOMY  2006   b/l SPO and TAH, uterine cancer  . EYE SURGERY Bilateral 1960  . TUBAL LIGATION      Family History  Problem Relation Age of Onset  . Cancer Mother        liver cancer, breast cancer x 2   . Alcohol abuse Mother        smoker  . Heart disease Father        MI  . Alcohol abuse Father        smoker  . Depression Sister   . Tremor Sister   . Cancer Brother        pancreatic  . Diabetes Maternal Grandmother   . Hearing loss Maternal Grandmother   . Depression Maternal Grandmother   . Allergic rhinitis Neg Hx   . Angioedema Neg Hx   . Asthma Neg Hx   . Eczema Neg Hx   . Immunodeficiency Neg Hx   . Urticaria Neg Hx     Social History   Socioeconomic History  . Marital status: Married    Spouse name: Not on file  . Number of children: Not on file  . Years of education: Not on file  . Highest education level: Not on file  Occupational History  . Not on file  Social Needs  . Financial resource strain: Not on file  . Food insecurity:    Worry: Not on file    Inability: Not on file  . Transportation needs:    Medical: Not on file    Non-medical: Not on file  Tobacco Use  . Smoking status: Former Smoker    Packs/day: 0.50    Years: 24.00    Pack years:  12.00    Types: Cigarettes    Last attempt to quit: 04/17/1987    Years since quitting: 30.7  . Smokeless tobacco: Never Used  Substance and Sexual Activity  . Alcohol use: Not Currently  . Drug use: Not Currently  . Sexual activity: Yes  Lifestyle  . Physical activity:    Days per week: Not on file    Minutes per session: Not on file  . Stress: Not on file  Relationships  . Social connections:    Talks on phone: Not on file    Gets together: Not on file    Attends religious service: Not on file    Active member of club or organization: Not on file    Attends meetings of clubs or organizations: Not on file    Relationship status: Not on file  . Intimate partner violence:    Fear of current or ex partner: Not on file    Emotionally abused: Not on file    Physically abused: Not  on file    Forced sexual activity: Not on file  Other Topics Concern  . Not on file  Social History Narrative  . Not on file    Outpatient Medications Prior to Visit  Medication Sig Dispense Refill  . aspirin EC 81 MG tablet Take 81 mg by mouth daily.    . Calcium Carbonate-Vit D-Min (CALCIUM 600+D PLUS MINERALS) 600-400 MG-UNIT TABS Take 1 tablet by mouth Two (2) times a day.    . camphor-menthol (SARNA) lotion Apply 1 application topically as needed for itching.    . ezetimibe (ZETIA) 10 MG tablet Take by mouth.    . fexofenadine (ALLEGRA) 180 MG tablet Take 180 mg by mouth at bedtime.    . fluticasone (FLONASE) 50 MCG/ACT nasal spray Two sprays each nostril once a day if needed for stuffy nose. 16 g 5  . glucose blood (ONE TOUCH ULTRA TEST) test strip Check blood sugar once daily. Dx E11.9    . Lancets 30G MISC Check blood sugar 3 times a day Dx E11.9    . lisinopril (PRINIVIL,ZESTRIL) 2.5 MG tablet Take 2.5 mg by mouth daily.    . methocarbamol (ROBAXIN) 500 MG tablet     . Propylene Glycol 0.6 % SOLN Apply to eye.    . ranitidine (ZANTAC) 150 MG capsule TAKE 1 CAPSULE BY MOUTH TWICE A DAY 60  capsule 0  . atorvastatin (LIPITOR) 80 MG tablet Take 80 mg by mouth daily.    . ticagrelor (BRILINTA) 90 MG TABS tablet Take 90 mg by mouth 2 (two) times daily.     Marland Kitchen triamcinolone cream (KENALOG) 0.1 % APPLY TO AFFECTED AREA TWICE A DAY AS NEEDED  0  . augmented betamethasone dipropionate (DIPROLENE-AF) 0.05 % cream APPLY TOPICALLY 2(TWO) TIMES A DAY FOR UP TO 2 WEEKS    . clobetasol cream (TEMOVATE) 0.05 % Apply 1 application topically daily as needed.    . diphenhydrAMINE (BENADRYL) 2 % cream Apply topically 3 (three) times daily as needed for itching.    Marland Kitchen ketoconazole (NIZORAL) 2 % shampoo SHAMPOO HAIR/SCALP AS DIRECTED    . loratadine (CLARITIN) 10 MG tablet 30 mg daily before breakfast.     . meloxicam (MOBIC) 15 MG tablet daily as needed for pain.     . potassium chloride (K-DUR) 10 MEQ tablet TAKE 1 TABLET BY MOUTH TWO  TIMES DAILY    . tretinoin (RETIN-A) 0.1 % cream Apply topically at bedtime.    . triamterene-hydrochlorothiazide (MAXZIDE-25) 37.5-25 MG tablet TAKE 1 TABLET BY MOUTH  DAILY     No facility-administered medications prior to visit.     Allergies  Allergen Reactions  . Amoxicillin Anaphylaxis  . Hydroxyzine Itching and Other (See Comments)    Other reaction(s): Other (See Comments) Sedation Sedation Sedation   . Sulfa Antibiotics Swelling    Other reaction(s): Swelling tongue tongue tongue     Review of Systems  Constitutional: Negative for fever and malaise/fatigue.  HENT: Positive for congestion.   Eyes: Negative for blurred vision.  Respiratory: Negative for shortness of breath.   Cardiovascular: Negative for chest pain, palpitations and leg swelling.  Gastrointestinal: Negative for abdominal pain, blood in stool and nausea.  Genitourinary: Negative for dysuria and frequency.  Musculoskeletal: Negative for falls.  Skin: Positive for rash.  Neurological: Negative for dizziness, loss of consciousness and headaches.  Endo/Heme/Allergies: Negative  for environmental allergies.  Psychiatric/Behavioral: Negative for depression. The patient is not nervous/anxious.        Objective:  Physical Exam  Constitutional: She is oriented to person, place, and time. She appears well-developed and well-nourished. No distress.  HENT:  Head: Normocephalic and atraumatic.  Nose: Nose normal.  Eyes: Right eye exhibits no discharge. Left eye exhibits no discharge.  Neck: Normal range of motion. Neck supple.  Cardiovascular: Normal rate and regular rhythm.  No murmur heard. Pulmonary/Chest: Effort normal and breath sounds normal.  Abdominal: Soft. Bowel sounds are normal. There is no tenderness.  Musculoskeletal: She exhibits no edema.  Neurological: She is alert and oriented to person, place, and time.  Skin: Skin is warm and dry.  Psychiatric: She has a normal mood and affect.  Nursing note and vitals reviewed.   BP 122/70 (BP Location: Left Arm, Patient Position: Sitting, Lee Size: Normal)   Pulse 61   Temp 98.1 F (36.7 C) (Oral)   Resp 18   Ht 5\' 2"  (1.575 m)   Wt 140 lb 6.4 oz (63.7 kg)   SpO2 98%   BMI 25.68 kg/m  Wt Readings from Last 3 Encounters:  12/26/17 140 lb 6.4 oz (63.7 kg)  12/23/17 141 lb 3.2 oz (64 kg)  11/18/17 142 lb (64.4 kg)     Lab Results  Component Value Date   WBC 9.9 12/26/2017   HGB 14.3 12/26/2017   HCT 41.4 12/26/2017   PLT 214.0 12/26/2017   GLUCOSE 94 12/26/2017   CHOL 92 12/26/2017   TRIG 86.0 12/26/2017   HDL 33.10 (L) 12/26/2017   LDLCALC 41 12/26/2017   ALT 28 12/26/2017   AST 36 12/26/2017   NA 143 12/26/2017   K 3.5 12/26/2017   CL 106 12/26/2017   CREATININE 0.83 12/26/2017   BUN 17 12/26/2017   CO2 27 12/26/2017   TSH 0.61 12/26/2017   HGBA1C 6.2 12/26/2017    Lab Results  Component Value Date   TSH 0.61 12/26/2017   Lab Results  Component Value Date   WBC 9.9 12/26/2017   HGB 14.3 12/26/2017   HCT 41.4 12/26/2017   MCV 86.3 12/26/2017   PLT 214.0 12/26/2017    Lab Results  Component Value Date   NA 143 12/26/2017   K 3.5 12/26/2017   CO2 27 12/26/2017   GLUCOSE 94 12/26/2017   BUN 17 12/26/2017   CREATININE 0.83 12/26/2017   BILITOT 1.2 12/26/2017   ALKPHOS 61 12/26/2017   AST 36 12/26/2017   ALT 28 12/26/2017   PROT 7.2 12/26/2017   ALBUMIN 4.2 12/26/2017   CALCIUM 9.9 12/26/2017   ANIONGAP 12 11/18/2017   GFR 71.76 12/26/2017   Lab Results  Component Value Date   CHOL 92 12/26/2017   Lab Results  Component Value Date   HDL 33.10 (L) 12/26/2017   Lab Results  Component Value Date   LDLCALC 41 12/26/2017   Lab Results  Component Value Date   TRIG 86.0 12/26/2017   Lab Results  Component Value Date   CHOLHDL 3 12/26/2017   Lab Results  Component Value Date   HGBA1C 6.2 12/26/2017       Assessment & Plan:   Problem List Items Addressed This Visit    Essential hypertension    Well controlled, no changes to meds. Encouraged heart healthy diet such as the DASH diet and exercise as tolerated.       Relevant Medications   atorvastatin (LIPITOR) 80 MG tablet   Other Relevant Orders   CBC (Completed)   Comprehensive metabolic panel (Completed)   TSH (Completed)   Hyperlipidemia  associated with type 2 diabetes mellitus (HCC)    Increase leafy greens, consider increased lean red meat and using cast iron cookware. Continue to monitor, report any concerns. Tolerating Atorvastatin      Relevant Medications   atorvastatin (LIPITOR) 80 MG tablet   Other Relevant Orders   Lipid panel (Completed)   Type 2 diabetes mellitus without complication, without long-term current use of insulin (HCC)    hgba1c acceptable, minimize simple carbs. Increase exercise as tolerated. Continue current meds      Relevant Medications   atorvastatin (LIPITOR) 80 MG tablet   Other Relevant Orders   Hemoglobin A1c (Completed)      I have discontinued Bosie Clos A. Nakagawa's diphenhydrAMINE, tretinoin, augmented betamethasone dipropionate,  clobetasol cream, ketoconazole, loratadine, meloxicam, potassium chloride, triamcinolone cream, and triamterene-hydrochlorothiazide. I have also changed her atorvastatin and ticagrelor. Additionally, I am having her maintain her aspirin EC, lisinopril, ranitidine, CALCIUM 600+D PLUS MINERALS, ezetimibe, glucose blood, Lancets 30G, methocarbamol, Propylene Glycol, fexofenadine, camphor-menthol, and fluticasone.  Meds ordered this encounter  Medications  . atorvastatin (LIPITOR) 80 MG tablet    Sig: Take 1 tablet (80 mg total) by mouth daily.    Dispense:  90 tablet    Refill:  1  . ticagrelor (BRILINTA) 90 MG TABS tablet    Sig: Take 1 tablet (90 mg total) by mouth 2 (two) times daily.    Dispense:  180 tablet    Refill:  1     Danise Edge, MD

## 2018-01-15 ENCOUNTER — Other Ambulatory Visit: Payer: Self-pay | Admitting: Family Medicine

## 2018-01-21 ENCOUNTER — Encounter: Payer: Self-pay | Admitting: Pediatrics

## 2018-01-21 ENCOUNTER — Ambulatory Visit: Payer: Medicare Other | Admitting: Pediatrics

## 2018-01-21 VITALS — BP 126/68 | HR 56 | Temp 97.7°F | Resp 16

## 2018-01-21 DIAGNOSIS — E1169 Type 2 diabetes mellitus with other specified complication: Secondary | ICD-10-CM

## 2018-01-21 DIAGNOSIS — I252 Old myocardial infarction: Secondary | ICD-10-CM

## 2018-01-21 DIAGNOSIS — L2084 Intrinsic (allergic) eczema: Secondary | ICD-10-CM

## 2018-01-21 DIAGNOSIS — J301 Allergic rhinitis due to pollen: Secondary | ICD-10-CM

## 2018-01-21 DIAGNOSIS — L500 Allergic urticaria: Secondary | ICD-10-CM

## 2018-01-21 MED ORDER — FAMOTIDINE 20 MG PO TABS: 20.0000 mg | ORAL_TABLET | Freq: Two times a day (BID) | ORAL | 5 refills | Status: DC

## 2018-01-21 NOTE — Progress Notes (Signed)
[address] [place] [address] Dept: [phone]  FOLLOW UP NOTE  Patient ID: Kristina Lee, female    DOB: [birth year]  Age: 72 y.o. MRN: [id number] Date of Office Visit: 01/21/2018  Assessment  Chief Complaint: Urticaria  HPI Kristina Lee is a 72 year old female who presents to the clinic for a follow up visit. She reports that, since her last visit, she had 2 weeks that she was hive free, however, the rash returned 6 days ago. She reports the rash as red, raised, and itchy, occurring on her right lower leg and moving upward daily.  She denies new soaps, perfumes, foods or medications. She denies contact with any plants or unusual substances. She continues Allegra 180 mg once a day and has stopped taking ranitidine 150 mg 2 days ago. She continues the mixture of triamcinolone and Sarna twice a day to the red itchy areas with no reported improvement. She sees Dr. Katrinka Blazing of dermatology specialty. Her current medications are listed in the chart.   Drug Allergies:  Allergies  Allergen Reactions  . Amoxicillin Anaphylaxis  . Hydroxyzine Itching and Other (See Comments)    Other reaction(s): Other (See Comments) Sedation Sedation Sedation   . Sulfa Antibiotics Swelling    Other reaction(s): Swelling tongue tongue tongue     Physical Exam: BP 126/68 (BP Location: Right Arm, Patient Position: Sitting, Cuff Size: Normal)   Pulse (!) 56   Temp 97.7 F (36.5 C) (Oral)   Resp 16    Physical Exam  Constitutional: She is oriented to person, place, and time. She appears well-developed and well-nourished.  HENT:  Head: Normocephalic.  Right Ear: External ear normal.  Left Ear: External ear normal.  Mouth/Throat: Oropharynx is clear and moist.  Nares normal. Pharynx normal. Ears normal. Eyes normal.   Eyes: Conjunctivae are normal.  Neck: Normal range of motion. Neck supple.  Cardiovascular: Normal rate, regular rhythm and normal heart sounds.  No murmur noted    Pulmonary/Chest: Effort normal and breath sounds normal.  Lungs clear to auscultation  Musculoskeletal: Normal range of motion.  Neurological: She is alert and oriented to person, place, and time.  Skin: Skin is warm.  Right leg with red raised rash on the lateral shin. No open areas or drainage.  Psychiatric: She has a normal mood and affect. Her behavior is normal. Judgment and thought content normal.  Vitals reviewed.    Assessment and Plan: 1. Allergic urticaria   2. History of myocardial infarction in last year   3. Seasonal allergic rhinitis due to pollen   4. Intrinsic atopic dermatitis   5. Type 2 diabetes mellitus with other specified complication, without long-term current use of insulin (HCC)     Meds ordered this encounter  Medications  . famotidine (PEPCID) 20 MG tablet    Sig: Take 1 tablet (20 mg total) by mouth 2 (two) times daily.    Dispense:  60 tablet    Refill:  5    Patient Instructions  Continue environmental control of dust and mold Allegra 180 mg-take 1 tablet in the morning for runny nose or itching.  If you are itching at night add Benadryl 25 mg - 1 capsule at night Fluticasone 2 sprays per nostril once a day if needed for stuffy nose Famotidine 20 mg. Take 1 tablet twice a day-it may help hives-this is an H2 antihistamine Continue on your other medications Call us if you are not doing well on this treatment plan  Triamcinolone 0.1% cream twice a day if needed to red itchy areas below the face as prescribed by your dermatologist .  Mix it with Sarna lotion twice a day as prescribed Keep your follow-up appointments with Dr. Katrinka Blazing  Follow up in this clinic as needed  Return if symptoms worsen or fail to improve.   Thank you for the opportunity to care for this patient.  Please do not hesitate to contact me with questions.  Thermon Leyland, FNP [facility]  I have provided oversight concerning  Thermon Leyland' evaluation and treatment of this patient's health issues addressed during today's encounter. I agree with the assessment and therapeutic plan as outlined in the note.   Thank you for the opportunity to care for this patient.  Please do not hesitate to contact me with questions.  Tonette Bihari, M.D.  [facility] [address] [place], [place] [address] [phone]

## 2018-01-21 NOTE — Patient Instructions (Addendum)
Continue environmental control of dust and mold Allegra 180 mg-take 1 tablet in the morning for runny nose or itching.  If you are itching at night add Benadryl 25 mg - 1 capsule at night Fluticasone 2 sprays per nostril once a day if needed for stuffy nose Famotidine 20 mg. Take 1 tablet twice a day-it may help hives-this is an H2 antihistamine Continue on your other medications Call us if you are not doing well on this treatment plan Triamcinolone 0.1% cream twice a day if needed to red itchy areas below the face as prescribed by your dermatologist .  Mix it with Sarna lotion twice a day as prescribed Keep your follow-up appointments with Dr. Katrinka Blazing  Follow up in this clinic as needed

## 2018-01-23 ENCOUNTER — Telehealth: Payer: Self-pay | Admitting: Family Medicine

## 2018-01-23 NOTE — Telephone Encounter (Signed)
Copied from CRM [id number]. Topic: General - Inquiry >> Jan 23, 2018 11:58 AM Maia Petties wrote: Reason for CRM:  1 - Pt states she was advised to stop lisinopril for 1 month - 01/24/18 is 30 days - please advise if she is to start taking again - she noted BPs below Patient goes to rehab 2 days a week and has the following records: 8/20 118/64 before PT 134/70 after PT HR 63 9/5 126/86 before PT & HR 72 after PT 9/16 130/70 before PT & HR 66 after PT 9/19 128/66 before PT & HR 60 after PT 9/23 120/64 before PT & HR 60 after PT 9/26 110/64 before PT & HR 60 after PT 9/30 140/78 before PT & HR 60 after PT 10/3 118/78 before PT & HR 60 after PT 10/7 120/64 before PT & HR 66 after PT 10/10 120/80 before PT & HR 60 after PT  2. Pt states she went to allergy specialist and the ranitidine was stopped due to recalls and pt was put on famotidine 20mg  1 tablet two times per day. She said that she had about 10 days without hives but now they are back on herr right leg and pretty severe.

## 2018-01-23 NOTE — Telephone Encounter (Signed)
Stay off the Lisinopril for now. Increase Allegra to bid for now and call allergy if rash does not respond. Could also consider coming in here for a steroid shot to see if that helps

## 2018-01-24 NOTE — Telephone Encounter (Signed)
Author phoned pt. to update on Dr. Mariel Aloe recommendations. No answer on phone, unable to leave VM. Author sent pt. [employer].

## 2018-01-27 ENCOUNTER — Telehealth: Payer: Self-pay

## 2018-01-27 NOTE — Telephone Encounter (Signed)
Pt. Phoned author to discuss her concerns in light of recent mychart message communication. Pt. Stated her allergist recently suggested she take benadryl 25mg  every night, which she has been doing, as it helps with her insomnia, but she wants to know if it is still recommended then to take her allegra at night too. Pt. States her rash that returned so far is staying localized to her right calf. She has been using kenalog cream and asked "can I get a steroid injection too for the rash? Steroid injections in the past have helped with my back pain too". Questions routed to Dr. Abner Greenspan.  Pt. Had other non-related concerns.   1) Pt. States she is becoming more hard of hearing bilaterally. Pt. wants to know how to go about getting hearing aids, and what brand/type Dr. Abner Greenspan may recommend.  2) Pt. states her hair is thinning and she is losing a good amount, and would like to look at getting a wig. Pt. requesting guidance on how best to go about "getting a good wig".  3) Pt. Is scheduled for dental surgery early November, is currently taking brilinta, and was told by dentist Dr. Scharlene Corn to reach out to PCP for abx rx.   Author told pt. we would try to address all concerns in a timely matter. Routed to Dr. Abner Greenspan to review.

## 2018-01-27 NOTE — Telephone Encounter (Signed)
I do not do antibiotics for dental procedures per new guidelines any longer. Use Allegra in am and at dinner then benadryl qhs max of 3 doses total is acceptable. Needs an appointment to get the rash evaluated. Can consider more labs to evaluate hair loss but I do not know much about wigs I am afraid. I usually have people go to [facility] for hearing evaluation and the aides keep changing so quick I do not know the best new brand they will advise. You do not need to be a member to use them

## 2018-01-28 NOTE — Telephone Encounter (Signed)
Author phoned pt. to relay Dr. Mariel Aloe message. Pt. verbalized understanding. OV to evaluate rash made for 10/24 at 330PM. No other concerns at this time.

## 2018-02-06 ENCOUNTER — Ambulatory Visit: Payer: Medicare Other | Admitting: Family Medicine

## 2018-02-06 VITALS — BP 142/72 | HR 57 | Temp 98.3°F | Resp 18 | Wt 139.4 lb

## 2018-02-06 DIAGNOSIS — M791 Myalgia, unspecified site: Secondary | ICD-10-CM

## 2018-02-06 DIAGNOSIS — I10 Essential (primary) hypertension: Secondary | ICD-10-CM

## 2018-02-06 DIAGNOSIS — E1169 Type 2 diabetes mellitus with other specified complication: Secondary | ICD-10-CM | POA: Diagnosis not present

## 2018-02-06 DIAGNOSIS — E785 Hyperlipidemia, unspecified: Secondary | ICD-10-CM

## 2018-02-06 MED ORDER — METHYLPREDNISOLONE 4 MG PO TABS: ORAL_TABLET | ORAL | 1 refills | Status: DC

## 2018-02-06 NOTE — Patient Instructions (Addendum)
[facility]: Dr Dierdre Forth and Dr Nickola Major Place neck pain patient instructions here.   Encouraged increased hydration and fiber in diet. Daily probiotics. If bowels not moving can use MOM 2 tbls po in 4 oz of warm prune juice by mouth every 2-3 days. If no results then repeat in 4 hours with  Dulcolax suppository pr, may repeat again in 4 more hours as needed. Seek care if symptoms worsen. Consider daily Miralax and/or Dulcolax if symptoms persist.   Biotin, Zinc and fish oil or flaxseed oil caps  Dermatology can help [place] with hair loss.  General Headache Without Cause A headache is pain or discomfort felt around the head or neck area. There are many causes and types of headaches. In some cases, the cause may not be found. Follow these instructions at home: Managing pain  Take over-the-counter and prescription medicines only as told by your doctor.  Lie down in a dark, quiet room when you have a headache.  If directed, apply ice to the head and neck area: ? Put ice in a plastic bag. ? Place a towel between your skin and the bag. ? Leave the ice on for 20 minutes, 2-3 times per day.  Use a heating pad or hot shower to apply heat to the head and neck area as told by your doctor.  Keep lights dim if bright lights bother you or make your headaches worse. Eating and drinking  Eat meals on a regular schedule.  Lessen how much alcohol you drink.  Lessen how much caffeine you drink, or stop drinking caffeine. General instructions  Keep all follow-up visits as told by your doctor. This is important.  Keep a journal to find out if certain things bring on headaches. For example, write down: ? What you eat and drink. ? How much sleep you get. ? Any change to your diet or medicines.  Relax by getting a massage or doing other relaxing activities.  Lessen stress.  Sit up straight. Do not tighten (tense) your muscles.  Do not use tobacco products. This includes cigarettes,  chewing tobacco, or e-cigarettes. If you need help quitting, ask your doctor.  Exercise regularly as told by your doctor.  Get enough sleep. This often means 7-9 hours of sleep. Contact a doctor if:  Your symptoms are not helped by medicine.  You have a headache that feels different than the other headaches.  You feel sick to your stomach (nauseous) or you throw up (vomit).  You have a fever. Get help right away if:  Your headache becomes really bad.  You keep throwing up.  You have a stiff neck.  You have trouble seeing.  You have trouble speaking.  You have pain in the eye or ear.  Your muscles are weak or you lose muscle control.  You lose your balance or have trouble walking.  You feel like you will pass out (faint) or you pass out.  You have confusion. This information is not intended to replace advice given to you by your health care provider. Make sure you discuss any questions you have with your health care provider. Document Released: 01/10/2008 Document Revised: 09/08/2015 Document Reviewed: 07/26/2014 Elsevier Interactive Patient Education  [employer].

## 2018-02-09 DIAGNOSIS — M791 Myalgia, unspecified site: Secondary | ICD-10-CM

## 2018-02-09 HISTORY — DX: Myalgia, unspecified site: M79.10

## 2018-02-09 NOTE — Assessment & Plan Note (Signed)
Encouraged heart healthy diet, increase exercise, avoid trans fats, consider a krill oil cap daily. If myalgias do not improve with steroid course will need to consider holding Atorvastastin

## 2018-02-09 NOTE — Assessment & Plan Note (Signed)
Well controlled given her level of pain, no changes to meds. Encouraged heart healthy diet such as the DASH diet and exercise as tolerated.

## 2018-02-09 NOTE — Assessment & Plan Note (Signed)
Has become diffuse not long after a viral, febrile illness in August. Consider PMR vs post viral arthritis. Start on steroids and assess response. Consider referral to rheumatology if pain persists.

## 2018-02-09 NOTE — Assessment & Plan Note (Signed)
hgba1c acceptable, minimize simple carbs. Increase exercise as tolerated. Continue current meds 

## 2018-02-09 NOTE — Progress Notes (Signed)
Subjective:    Patient ID: Kristina Lee, female    DOB: Jun 01, 1945, 72 y.o.   MRN: [id number]  No chief complaint on file.   HPI Patient is in today for follow up.  She is struggling with worsening pain and poor sleep as a result.  She reports that she had what she considers a viral illness with a fever to 102.5, malaise, myalgias, tremulousness and congestion back in August and sometime shortly thereafter began to develop worsening neck, back, gluteal, knee pain, shoulder pain and more.  Reports all of the pain has affected her balance.  No current fever but some mild congestion noticed at times.  She does note her rash has improved a good deal but she has had a headache for about the last 10 days. Denies CP/palp/SOB/fevers/GI or GU c/o. Taking meds as prescribed  Past Medical History:  Diagnosis Date  . Diabetes mellitus without complication (HCC)   . Heart attack (HCC) 09/07/2017  . Urticaria     Past Surgical History:  Procedure Laterality Date  . ABDOMINAL HYSTERECTOMY  2006   b/l SPO and TAH, uterine cancer  . EYE SURGERY Bilateral 1960  . TUBAL LIGATION      Family History  Problem Relation Age of Onset  . Cancer Mother        liver cancer, breast cancer x 2   . Alcohol abuse Mother        smoker  . Heart disease Father        MI  . Alcohol abuse Father        smoker  . Depression Sister   . Tremor Sister   . Cancer Brother        pancreatic  . Diabetes Maternal Grandmother   . Hearing loss Maternal Grandmother   . Depression Maternal Grandmother   . Allergic rhinitis Neg Hx   . Angioedema Neg Hx   . Asthma Neg Hx   . Eczema Neg Hx   . Immunodeficiency Neg Hx   . Urticaria Neg Hx     Social History   Socioeconomic History  . Marital status: Married    Spouse name: Not on file  . Number of children: Not on file  . Years of education: Not on file  . Highest education level: Not on file  Occupational History  . Not on file  Social Needs  .  Financial resource strain: Not on file  . Food insecurity:    Worry: Not on file    Inability: Not on file  . Transportation needs:    Medical: Not on file    Non-medical: Not on file  Tobacco Use  . Smoking status: Former Smoker    Packs/day: 0.50    Years: 24.00    Pack years: 12.00    Types: Cigarettes    Last attempt to quit: 04/17/1987    Years since quitting: 30.8  . Smokeless tobacco: Never Used  Substance and Sexual Activity  . Alcohol use: Not Currently  . Drug use: Not Currently  . Sexual activity: Yes  Lifestyle  . Physical activity:    Days per week: Not on file    Minutes per session: Not on file  . Stress: Not on file  Relationships  . Social connections:    Talks on phone: Not on file    Gets together: Not on file    Attends religious service: Not on file    Active member of club or organization: Not  on file    Attends meetings of clubs or organizations: Not on file    Relationship status: Not on file  . Intimate partner violence:    Fear of current or ex partner: Not on file    Emotionally abused: Not on file    Physically abused: Not on file    Forced sexual activity: Not on file  Other Topics Concern  . Not on file  Social History Narrative  . Not on file    Outpatient Medications Prior to Visit  Medication Sig Dispense Refill  . Acetaminophen (TYLENOL PO) Take 500 mg by mouth as needed.    Marland Kitchen aspirin EC 81 MG tablet Take 81 mg by mouth daily.    Marland Kitchen atorvastatin (LIPITOR) 80 MG tablet Take 1 tablet (80 mg total) by mouth daily. 90 tablet 1  . Calcium Carbonate-Vit D-Min (CALCIUM 600+D PLUS MINERALS) 600-400 MG-UNIT TABS Take 1 tablet by mouth Two (2) times a day.    . camphor-menthol (SARNA) lotion Apply 1 application topically as needed for itching.    . famotidine (PEPCID) 20 MG tablet Take 1 tablet (20 mg total) by mouth 2 (two) times daily. 60 tablet 5  . fexofenadine (ALLEGRA) 180 MG tablet Take 180 mg by mouth at bedtime.    Marland Kitchen lisinopril  (PRINIVIL,ZESTRIL) 2.5 MG tablet Take 2.5 mg by mouth daily.    . methocarbamol (ROBAXIN) 500 MG tablet     . Propylene Glycol 0.6 % SOLN Apply to eye.    . ticagrelor (BRILINTA) 90 MG TABS tablet Take 1 tablet (90 mg total) by mouth 2 (two) times daily. 180 tablet 1  . triamcinolone cream (KENALOG) 0.1 %      No facility-administered medications prior to visit.     Allergies  Allergen Reactions  . Amoxicillin Anaphylaxis  . Hydroxyzine Itching and Other (See Comments)    Other reaction(s): Other (See Comments) Sedation Sedation Sedation   . Sulfa Antibiotics Swelling    Other reaction(s): Swelling tongue tongue tongue     Review of Systems  Constitutional: Negative for fever and malaise/fatigue.  HENT: Positive for congestion.   Eyes: Negative for blurred vision.  Respiratory: Negative for cough and shortness of breath.   Cardiovascular: Negative for chest pain, palpitations and leg swelling.  Gastrointestinal: Negative for vomiting.  Musculoskeletal: Positive for back pain, joint pain and myalgias.  Skin: Negative for rash.  Neurological: Negative for loss of consciousness and headaches.       Objective:    Physical Exam  Constitutional: She is oriented to person, place, and time. She appears well-developed and well-nourished. No distress.  HENT:  Head: Normocephalic and atraumatic.  Nose: Nose normal.  Eyes: Right eye exhibits no discharge. Left eye exhibits no discharge.  Neck: Normal range of motion. Neck supple.  Cardiovascular: Normal rate and regular rhythm.  No murmur heard. Pulmonary/Chest: Effort normal and breath sounds normal.  Abdominal: Soft. Bowel sounds are normal. There is no tenderness.  Musculoskeletal: She exhibits no edema.  Neurological: She is alert and oriented to person, place, and time.  Skin: Skin is warm and dry.  Psychiatric: She has a normal mood and affect.  Nursing note and vitals reviewed.   BP (!) 142/72 (BP Location: Left  Arm, Patient Position: Sitting, Cuff Size: Normal)   Pulse (!) 57   Temp 98.3 F (36.8 C) (Oral)   Resp 18   Wt 139 lb 6.4 oz (63.2 kg)   SpO2 98%   BMI 25.50 kg/m  Wt Readings from Last 3 Encounters:  02/06/18 139 lb 6.4 oz (63.2 kg)  12/26/17 140 lb 6.4 oz (63.7 kg)  12/23/17 141 lb 3.2 oz (64 kg)     Lab Results  Component Value Date   WBC 9.9 12/26/2017   HGB 14.3 12/26/2017   HCT 41.4 12/26/2017   PLT 214.0 12/26/2017   GLUCOSE 94 12/26/2017   CHOL 92 12/26/2017   TRIG 86.0 12/26/2017   HDL 33.10 (L) 12/26/2017   LDLCALC 41 12/26/2017   ALT 28 12/26/2017   AST 36 12/26/2017   NA 143 12/26/2017   K 3.5 12/26/2017   CL 106 12/26/2017   CREATININE 0.83 12/26/2017   BUN 17 12/26/2017   CO2 27 12/26/2017   TSH 0.61 12/26/2017   HGBA1C 6.2 12/26/2017    Lab Results  Component Value Date   TSH 0.61 12/26/2017   Lab Results  Component Value Date   WBC 9.9 12/26/2017   HGB 14.3 12/26/2017   HCT 41.4 12/26/2017   MCV 86.3 12/26/2017   PLT 214.0 12/26/2017   Lab Results  Component Value Date   NA 143 12/26/2017   K 3.5 12/26/2017   CO2 27 12/26/2017   GLUCOSE 94 12/26/2017   BUN 17 12/26/2017   CREATININE 0.83 12/26/2017   BILITOT 1.2 12/26/2017   ALKPHOS 61 12/26/2017   AST 36 12/26/2017   ALT 28 12/26/2017   PROT 7.2 12/26/2017   ALBUMIN 4.2 12/26/2017   CALCIUM 9.9 12/26/2017   ANIONGAP 12 11/18/2017   GFR 71.76 12/26/2017   Lab Results  Component Value Date   CHOL 92 12/26/2017   Lab Results  Component Value Date   HDL 33.10 (L) 12/26/2017   Lab Results  Component Value Date   LDLCALC 41 12/26/2017   Lab Results  Component Value Date   TRIG 86.0 12/26/2017   Lab Results  Component Value Date   CHOLHDL 3 12/26/2017   Lab Results  Component Value Date   HGBA1C 6.2 12/26/2017       Assessment & Plan:   Problem List Items Addressed This Visit    Essential hypertension    Well controlled given her level of pain, no changes to  meds. Encouraged heart healthy diet such as the DASH diet and exercise as tolerated.       Hyperlipidemia associated with type 2 diabetes mellitus (HCC)    Encouraged heart healthy diet, increase exercise, avoid trans fats, consider a krill oil cap daily. If myalgias do not improve with steroid course will need to consider holding Atorvastastin      Diabetes mellitus (HCC)    hgba1c acceptable, minimize simple carbs. Increase exercise as tolerated. Continue current meds      Myalgia - Primary    Has become diffuse not long after a viral, febrile illness in August. Consider PMR vs post viral arthritis. Start on steroids and assess response. Consider referral to rheumatology if pain persists.       Relevant Orders   Sedimentation rate      I am having Bosie Clos A. Mallo start on methylPREDNISolone. I am also having her maintain her aspirin EC, lisinopril, CALCIUM 600+D PLUS MINERALS, methocarbamol, Propylene Glycol, fexofenadine, camphor-menthol, atorvastatin, ticagrelor, triamcinolone cream, Acetaminophen (TYLENOL PO), and famotidine.  Meds ordered this encounter  Medications  . methylPREDNISolone (MEDROL) 4 MG tablet    Sig: 5 tab po qd X 3d then 4 tab po qd X 3d then 3 tab po qd X 3d then 2  tab po qd x 3 d  then 1 tab po qd x 3 days then stop    Dispense:  45 tablet    Refill:  1     Danise Edge, MD

## 2018-02-20 ENCOUNTER — Telehealth: Payer: Self-pay

## 2018-02-20 NOTE — Telephone Encounter (Signed)
Call in from cardiac rehab pt in their office with HR in 50's, seems confused and has a migraine. Pt told them that last time this happened it was after her MI. Pt instructed to go to ED to be evaluated.

## 2018-02-20 NOTE — Telephone Encounter (Signed)
Health maintenance updated- not validated.

## 2018-02-20 NOTE — Telephone Encounter (Signed)
Copied from CRM [id number]. Topic: General - Other >> Feb 07, 2018  9:32 AM Gerrianne Scale wrote: Reason for CRM: pt states that she received a message in MyChart stating that she was over due for a colon screening but she had one last year at Dr Bonnye Fava she is located in [place]

## 2018-02-21 ENCOUNTER — Ambulatory Visit: Payer: Self-pay

## 2018-02-21 MED ORDER — ATORVASTATIN CALCIUM 40 MG PO TABS
80.00 | ORAL_TABLET | ORAL | Status: DC
Start: 2018-02-21 — End: 2018-02-21

## 2018-02-21 MED ORDER — DIPHENHYDRAMINE HCL 25 MG PO CAPS
25.00 | ORAL_CAPSULE | ORAL | Status: DC
Start: ? — End: 2018-02-21

## 2018-02-21 MED ORDER — INSULIN LISPRO 100 UNIT/ML ~~LOC~~ SOLN
1.00 | SUBCUTANEOUS | Status: DC
Start: 2018-02-21 — End: 2018-02-21

## 2018-02-21 MED ORDER — LISINOPRIL 5 MG PO TABS
2.50 | ORAL_TABLET | ORAL | Status: DC
Start: 2018-02-22 — End: 2018-02-21

## 2018-02-21 MED ORDER — HYDROCHLOROTHIAZIDE 25 MG PO TABS
25.00 | ORAL_TABLET | ORAL | Status: DC
Start: 2018-02-22 — End: 2018-02-21

## 2018-02-21 MED ORDER — DEXTROSE 10 % IV SOLN
125.00 | INTRAVENOUS | Status: DC
Start: ? — End: 2018-02-21

## 2018-02-21 MED ORDER — GUAIFENESIN-DM 100-10 MG/5ML PO SYRP
5.00 | ORAL_SOLUTION | ORAL | Status: DC
Start: ? — End: 2018-02-21

## 2018-02-21 MED ORDER — CLONIDINE HCL 0.1 MG PO TABS
0.10 | ORAL_TABLET | ORAL | Status: DC
Start: ? — End: 2018-02-21

## 2018-02-21 MED ORDER — GENERIC EXTERNAL MEDICATION
1.00 | Status: DC
Start: ? — End: 2018-02-21

## 2018-02-21 MED ORDER — ONDANSETRON HCL 4 MG/2ML IJ SOLN
4.00 | INTRAMUSCULAR | Status: DC
Start: ? — End: 2018-02-21

## 2018-02-21 MED ORDER — ASPIRIN EC 81 MG PO TBEC
81.00 | DELAYED_RELEASE_TABLET | ORAL | Status: DC
Start: 2018-02-22 — End: 2018-02-21

## 2018-02-21 MED ORDER — TICAGRELOR 90 MG PO TABS
90.00 | ORAL_TABLET | ORAL | Status: DC
Start: 2018-02-21 — End: 2018-02-21

## 2018-02-21 MED ORDER — ACETAMINOPHEN 325 MG PO TABS
650.00 | ORAL_TABLET | ORAL | Status: DC
Start: ? — End: 2018-02-21

## 2018-02-21 MED ORDER — ALUMINUM-MAGNESIUM-SIMETHICONE 200-200-20 MG/5ML PO SUSP
30.00 | ORAL | Status: DC
Start: ? — End: 2018-02-21

## 2018-02-21 MED ORDER — GLUCOSE 40 % PO GEL
15.00 | ORAL | Status: DC
Start: ? — End: 2018-02-21

## 2018-02-21 MED ORDER — TRETINOIN 0.1 % EX CREA
TOPICAL_CREAM | CUTANEOUS | Status: DC
Start: 2018-02-21 — End: 2018-02-21

## 2018-02-21 MED ORDER — BISACODYL 5 MG PO TBEC
10.00 | DELAYED_RELEASE_TABLET | ORAL | Status: DC
Start: ? — End: 2018-02-21

## 2018-02-21 NOTE — Telephone Encounter (Signed)
Returned call to pt; she was asleep; spoke with husband. (on [place])  Husband reported the pt. was sent to ER from Cardiac Rehab.,on 11/7, due to sudden onset of "confusion, slurred speech and was incoherent."  Was admitted to [facility] overnight, and was diagnosed with "Vascular Dementia."  Was discharged this morning, and advised to get in touch with PCP for a referral to Neurology ASAP.  Husband stated "her speech has cleared, but she is having difficulty remembering, and in translating information. When I try to correct her, she gets easily upset." Questioned if pt. has any complaints; reported that she c/o left side of head hurts.  Denied that she has any mobility issues, or any other specific complaints.    Call placed to [facility] Coord[facility]; spoke with nurse, Windell Moulding.  Advised of above situation and information as was relayed per pt's. Husband.  She spoke with D.r Abner Greenspan and stated it is okay to sched. pt. for a Hospital F/U with another provider on 02/24/18, since the pt. needs a more immediate appt.    Called pt's husband back and advised that would need to schedule pt. on 11/11 with another provider at PCP office.  Appt. Given for 11:15 AM on 11/11.  Agreed with plan.   Care advise given to take pt. To ER if symptoms worsen prior to appt. on 11/11.  Verb. Understanding.     Husband stated he wanted the provider to know that the pt. does not know anything about the diagnosis of Vascular Dementia.    Message from Crist Infante sent at 02/21/2018 11:39 AM EST   Summary: advice   Pt's family took her to [facility] last night. Pt states she was not allowed to leave. pt states she saw neurologist this am in [facility]. Pt is having  1. Trouble voicing words 2. Trouble moving  Pt wants the dr to know all about this visit and needs to ask about some medications          Reason for Disposition . [1] Follow-up call to recent contact AND [2] information only call,  no triage required  Answer Assessment - Initial Assessment Questions 1. REASON FOR CALL or QUESTION: "What is your reason for calling today?" or "How can I best help you?" or "What question do you have that I can help answer?"     Husband called to update PCP about pt. Being hospitalized overnight, discharged today, and advised to have PCP refer to Neurology ASAP.  Protocols used: INFORMATION ONLY CALL-A-AH

## 2018-02-21 NOTE — Telephone Encounter (Signed)
Patient called, unable to leave VM due to mailbox is full. 

## 2018-02-24 ENCOUNTER — Encounter: Payer: Self-pay | Admitting: Family Medicine

## 2018-02-24 ENCOUNTER — Ambulatory Visit: Payer: Medicare Other | Admitting: Family Medicine

## 2018-02-24 VITALS — BP 120/78 | HR 55 | Temp 98.0°F | Ht 62.0 in | Wt 136.0 lb

## 2018-02-24 DIAGNOSIS — F0151 Vascular dementia with behavioral disturbance: Secondary | ICD-10-CM | POA: Diagnosis not present

## 2018-02-24 DIAGNOSIS — F01518 Vascular dementia, unspecified severity, with other behavioral disturbance: Secondary | ICD-10-CM

## 2018-02-24 NOTE — Patient Instructions (Addendum)
If you do not hear anything about your referral in the next 1-2 weeks, call our office and ask for an update.  Stay on your Lipitor and aspirin. Stay active and eat healthy.   Let us know if you need anything.  EXERCISES  RANGE OF MOTION (ROM) AND STRETCHING EXERCISES These exercises may help you when beginning to rehabilitate your injury. While completing these exercises, remember:   Restoring tissue flexibility helps normal motion to return to the joints. This allows healthier, less painful movement and activity.  An effective stretch should be held for at least 30 seconds.  A stretch should never be painful. You should only feel a gentle lengthening or release in the stretched tissue.  ROM - Pendulum  Bend at the waist so that your right / left arm falls away from your body. Support yourself with your opposite hand on a solid surface, such as a table or a countertop.  Your right / left arm should be perpendicular to the ground. If it is not perpendicular, you need to lean over farther. Relax the muscles in your right / left arm and shoulder as much as possible.  Gently sway your hips and trunk so they move your right / left arm without any use of your right / left shoulder muscles.  Progress your movements so that your right / left arm moves side to side, then forward and backward, and finally, both clockwise and counterclockwise.  Complete 10-15 repetitions in each direction. Many people use this exercise to relieve discomfort in their shoulder as well as to gain range of motion. Repeat 2 times. Complete this exercise 3 times per week.  STRETCH - Flexion, Standing  Stand with good posture. With an underhand grip on your right / left hand and an overhand grip on the opposite hand, grasp a broomstick or cane so that your hands are a little more than shoulder-width apart.  Keeping your right / left elbow straight and shoulder muscles relaxed, push the stick with your opposite hand  to raise your right / left arm in front of your body and then overhead. Raise your arm until you feel a stretch in your right / left shoulder, but before you have increased shoulder pain.  Try to avoid shrugging your right / left shoulder as your arm rises by keeping your shoulder blade tucked down and toward your mid-back spine. Hold 30 seconds.  Slowly return to the starting position. Repeat 2 times. Complete this exercise 3 times per week.  STRETCH - Internal Rotation  Place your right / left hand behind your back, palm-up.  Throw a towel or belt over your opposite shoulder. Grasp the towel/belt with your right / left hand.  While keeping an upright posture, gently pull up on the towel/belt until you feel a stretch in the front of your right / left shoulder.  Avoid shrugging your right / left shoulder as your arm rises by keeping your shoulder blade tucked down and toward your mid-back spine.  Hold 30. Release the stretch by lowering your opposite hand. Repeat 2 times. Complete this exercise 3 times per week.  STRETCH - External Rotation and Abduction  Stagger your stance through a doorframe. It does not matter which foot is forward.  As instructed by your physician, physical therapist or athletic trainer, place your hands: ? And forearms above your head and on the door frame. ? And forearms at head-height and on the door frame. ? At elbow-height and on the door  frame.  Keeping your head and chest upright and your stomach muscles tight to prevent over-extending your low-back, slowly shift your weight onto your front foot until you feel a stretch across your chest and/or in the front of your shoulders.  Hold 30 seconds. Shift your weight to your back foot to release the stretch. Repeat 2 times. Complete this stretch 3 times per week.   STRENGTHENING EXERCISES  These exercises may help you when beginning to rehabilitate your injury. They may resolve your symptoms with or without  further involvement from your physician, physical therapist or athletic trainer. While completing these exercises, remember:   Muscles can gain both the endurance and the strength needed for everyday activities through controlled exercises.  Complete these exercises as instructed by your physician, physical therapist or athletic trainer. Progress the resistance and repetitions only as guided.  You may experience muscle soreness or fatigue, but the pain or discomfort you are trying to eliminate should never worsen during these exercises. If this pain does worsen, stop and make certain you are following the directions exactly. If the pain is still present after adjustments, discontinue the exercise until you can discuss the trouble with your clinician.  If advised by your physician, during your recovery, avoid activity or exercises which involve actions that place your right / left hand or elbow above your head or behind your back or head. These positions stress the tissues which are trying to heal.  STRENGTH - Scapular Depression and Adduction  With good posture, sit on a firm chair. Supported your arms in front of you with pillows, arm rests or a table top. Have your elbows in line with the sides of your body.  Gently draw your shoulder blades down and toward your mid-back spine. Gradually increase the tension without tensing the muscles along the top of your shoulders and the back of your neck.  Hold for 3 seconds. Slowly release the tension and relax your muscles completely before completing the next repetition.  After you have practiced this exercise, remove the arm support and complete it in standing as well as sitting. Repeat 2 times. Complete this exercise 3 times per week.   STRENGTH - External Rotators  Secure a rubber exercise band/tubing to a fixed object so that it is at the same height as your right / left elbow when you are standing or sitting on a firm surface.  Stand or sit so  that the secured exercise band/tubing is at your side that is not injured.  Bend your elbow 90 degrees. Place a folded towel or small pillow under your right / left arm so that your elbow is a few inches away from your side.  Keeping the tension on the exercise band/tubing, pull it away from your body, as if pivoting on your elbow. Be sure to keep your body steady so that the movement is only coming from your shoulder rotating.  Hold 3 seconds. Release the tension in a controlled manner as you return to the starting position. Repeat 2 times. Complete this exercise 3 times per week.   STRENGTH - Supraspinatus  Stand or sit with good posture. Grasp a 2-3 lb weight or an exercise band/tubing so that your hand is "thumbs-up," like when you shake hands.  Slowly lift your right / left hand from your thigh into the air, traveling about 30 degrees from straight out at your side. Lift your hand to shoulder height or as far as you can without increasing any shoulder  pain. Initially, many people do not lift their hands above shoulder height.  Avoid shrugging your right / left shoulder as your arm rises by keeping your shoulder blade tucked down and toward your mid-back spine.  Hold for 3 seconds. Control the descent of your hand as you slowly return to your starting position. Repeat 2 times. Complete this exercise 3 times per week.   STRENGTH - Shoulder Extensors  Secure a rubber exercise band/tubing so that it is at the height of your shoulders when you are either standing or sitting on a firm arm-less chair.  With a thumbs-up grip, grasp an end of the band/tubing in each hand. Straighten your elbows and lift your hands straight in front of you at shoulder height. Step back away from the secured end of band/tubing until it becomes tense.  Squeezing your shoulder blades together, pull your hands down to the sides of your thighs. Do not allow your hands to go behind you.  Hold for 3 seconds. Slowly  ease the tension on the band/tubing as you reverse the directions and return to the starting position. Repeat 2 times. Complete this exercise 3 times per week.   STRENGTH - Scapular Retractors  Secure a rubber exercise band/tubing so that it is at the height of your shoulders when you are either standing or sitting on a firm arm-less chair.  With a palm-down grip, grasp an end of the band/tubing in each hand. Straighten your elbows and lift your hands straight in front of you at shoulder height. Step back away from the secured end of band/tubing until it becomes tense.  Squeezing your shoulder blades together, draw your elbows back as you bend them. Keep your upper arm lifted away from your body throughout the exercise.  Hold 3 seconds. Slowly ease the tension on the band/tubing as you reverse the directions and return to the starting position. Repeat 2 times. Complete this exercise 3 times per week.  STRENGTH - Scapular Depressors  Find a sturdy chair without wheels, such as a from a dining room table.  Keeping your feet on the floor, lift your bottom from the seat and lock your elbows.  Keeping your elbows straight, allow gravity to pull your body weight down. Your shoulders will rise toward your ears.  Raise your body against gravity by drawing your shoulder blades down your back, shortening the distance between your shoulders and ears. Although your feet should always maintain contact with the floor, your feet should progressively support less body weight as you get stronger.  Hold 3 seconds. In a controlled and slow manner, lower your body weight to begin the next repetition. Repeat 2 times. Complete this exercise 3 times per week.   This information is not intended to replace advice given to you by your health care provider. Make sure you discuss any questions you have with your health care provider.  Document Released: 02/14/2005 Document Revised: 04/23/2014 Document Reviewed:  07/15/2008 Elsevier Interactive Patient Education [employer]

## 2018-02-24 NOTE — Progress Notes (Signed)
Pre visit review using our clinic review tool, if applicable. No additional management support is needed unless otherwise documented below in the visit note. 

## 2018-02-24 NOTE — Progress Notes (Addendum)
Chief Complaint  Patient presents with  . Hospitalization Follow-up    HPI Kristina Lee is a 72 y.o. y.o. female who presents for a transition of care visit.  Pt was discharged from [facility] on 02/21/18.  Pt presented w/in 48 business hours of discharge. Pt is here with her husband who helps provide the hx.   Pt at cardiac rehab and started having confusion and slurred speech. Pt admitted to [facility] for slurred speech and AMS. MRI showed no acute infarct. UA unremarkable for infection. Needs a referral to Neuro. She has a hx of MI and reports compliance with her aspirin and Lipitor.  Speech better since discharge. No neurologic s/s's right now. Having chronic hip issues. Also having some shoulder pain.   Past Medical History:  Diagnosis Date  . Diabetes mellitus without complication (HCC)   . Heart attack (HCC) 09/07/2017  . Urticaria    Family History  Problem Relation Age of Onset  . Cancer Mother        liver cancer, breast cancer x 2   . Alcohol abuse Mother        smoker  . Heart disease Father        MI  . Alcohol abuse Father        smoker  . Depression Sister   . Tremor Sister   . Cancer Brother        pancreatic  . Diabetes Maternal Grandmother   . Hearing loss Maternal Grandmother   . Depression Maternal Grandmother   . Allergic rhinitis Neg Hx   . Angioedema Neg Hx   . Asthma Neg Hx   . Eczema Neg Hx   . Immunodeficiency Neg Hx   . Urticaria Neg Hx    Allergies as of 02/24/2018      Reactions   Amoxicillin Anaphylaxis   Hydroxyzine Itching, Other (See Comments)   Other reaction(s): Other (See Comments) Sedation Sedation Sedation   Sulfa Antibiotics Swelling   Other reaction(s): Swelling tongue tongue tongue      Medication List        Accurate as of 02/24/18 12:01 PM. Always use your most recent med list.          aspirin EC 81 MG tablet Take 81 mg by mouth daily.   atorvastatin 80 MG tablet Commonly known as:  LIPITOR Take 1 tablet (80 mg  total) by mouth daily.   CALCIUM 600+D PLUS MINERALS 600-400 MG-UNIT Tabs Take 1 tablet by mouth Two (2) times a day.   famotidine 20 MG tablet Commonly known as:  PEPCID Take 1 tablet (20 mg total) by mouth 2 (two) times daily.   fexofenadine 180 MG tablet Commonly known as:  ALLEGRA Take 180 mg by mouth at bedtime.   lisinopril 2.5 MG tablet Commonly known as:  PRINIVIL,ZESTRIL Take 2.5 mg by mouth daily.   methocarbamol 500 MG tablet Commonly known as:  ROBAXIN   methylPREDNISolone 4 MG tablet Commonly known as:  MEDROL 5 tab po qd X 3d then 4 tab po qd X 3d then 3 tab po qd X 3d then 2 tab po qd x 3 d  then 1 tab po qd x 3 days then stop   Propylene Glycol 0.6 % Soln Apply to eye.   SARNA lotion Generic drug:  camphor-menthol Apply 1 application topically as needed for itching.   ticagrelor 90 MG Tabs tablet Commonly known as:  BRILINTA Take 1 tablet (90 mg total) by mouth 2 (two) times  daily.   triamcinolone cream 0.1 % Commonly known as:  KENALOG   TYLENOL PO Take 500 mg by mouth as needed.       ROS:  Psych: No anxiety Cardiac: No CP Lungs: No SOB Neuro: As noted in HPI MSK: +hip pain GU: No urinary complaints GI: No diarrhea Skin: No rashes Heme: +easy bruising Eyes: No vision loss   Objective BP 120/78 (BP Location: Left Arm, Patient Position: Sitting, Cuff Size: Normal)   Pulse (!) 55   Temp 98 F (36.7 C) (Oral)   Ht 5\' 2"  (1.575 m)   Wt 136 lb (61.7 kg)   SpO2 99%   BMI 24.87 kg/m  General Appearance:  awake, alert, oriented, in no acute distress and well developed, well nourished Skin:  there are no suspicious lesions or rashes of concern Head/face:  NCAT Eyes:  EOMI, PERRLA Ears:  canals and TMs NI Nose/Sinuses:  negative Mouth/Throat:  Mucosa moist, no lesions; pharynx without erythema, edema or exudate. Neck:  neck- supple, no mass, non-tender and no jvd Lungs: Clear to auscultation.  No rales, rhonchi, or wheezing. Normal  effort, no accessory muscle use. Heart:  Heart sounds are normal.  Regular rate and rhythm without murmur, gallop or rub. No bruits. Abdomen:  BS+, soft, NT, ND, no masses or organomegaly Musculoskeletal:  No muscle group atrophy or asymmetry, gait normal Neurologic:  Alert and oriented x 3, gait normal., reflexes normal and symmetric, strength and  sensation grossly normal Psych exam: Nml mood and affect, limited judgment and insight  Vascular dementia with behavior disturbance (HCC) - Plan: Ambulatory referral to Neurology  Discharge summary pending review (have not received records yet) and medication list have been reviewed/reconciled.  Labs were reportedly normal. Hold off on f/u labs for now. Follow-up Neuro appointment to [facility] ordered today.  TRANSITIONAL CARE MANAGEMENT CERTIFICATION:  I certify the following are true:   1. Communication with the patient/care giver was made within 2 business days of discharge.  2. Complexity of Medical decision making is moderate.  3. Face to face visit occurred within 14 days of discharge.   Cont ASA and statin. F/u as originally scheduled with reg pcp. The patient and her husbandvoiced understanding and agreement to the plan.  Jilda Roche Darby, DO 02/24/18 12:01 PM

## 2018-03-12 ENCOUNTER — Emergency Department (HOSPITAL_COMMUNITY)
Admission: EM | Admit: 2018-03-12 | Discharge: 2018-03-13 | Disposition: A | Discharge status: Home / Self Care | Payer: Medicare Other | Attending: Emergency Medicine | Admitting: Emergency Medicine

## 2018-03-12 ENCOUNTER — Encounter (HOSPITAL_COMMUNITY): Payer: Self-pay

## 2018-03-12 ENCOUNTER — Emergency Department (HOSPITAL_COMMUNITY): Payer: Medicare Other

## 2018-03-12 ENCOUNTER — Other Ambulatory Visit: Payer: Self-pay

## 2018-03-12 DIAGNOSIS — R197 Diarrhea, unspecified: Secondary | ICD-10-CM | POA: Insufficient documentation

## 2018-03-12 DIAGNOSIS — E119 Type 2 diabetes mellitus without complications: Secondary | ICD-10-CM | POA: Insufficient documentation

## 2018-03-12 DIAGNOSIS — W19XXXA Unspecified fall, initial encounter: Secondary | ICD-10-CM

## 2018-03-12 DIAGNOSIS — N183 Chronic kidney disease, stage 3 (moderate): Secondary | ICD-10-CM | POA: Insufficient documentation

## 2018-03-12 DIAGNOSIS — Z79899 Other long term (current) drug therapy: Secondary | ICD-10-CM | POA: Insufficient documentation

## 2018-03-12 DIAGNOSIS — I129 Hypertensive chronic kidney disease with stage 1 through stage 4 chronic kidney disease, or unspecified chronic kidney disease: Secondary | ICD-10-CM | POA: Diagnosis not present

## 2018-03-12 DIAGNOSIS — M545 Low back pain, unspecified: Secondary | ICD-10-CM

## 2018-03-12 DIAGNOSIS — Z87891 Personal history of nicotine dependence: Secondary | ICD-10-CM | POA: Diagnosis not present

## 2018-03-12 DIAGNOSIS — R112 Nausea with vomiting, unspecified: Secondary | ICD-10-CM | POA: Insufficient documentation

## 2018-03-12 DIAGNOSIS — F0390 Unspecified dementia without behavioral disturbance: Secondary | ICD-10-CM | POA: Diagnosis not present

## 2018-03-12 DIAGNOSIS — R42 Dizziness and giddiness: Secondary | ICD-10-CM | POA: Diagnosis present

## 2018-03-12 LAB — CBC WITH DIFFERENTIAL/PLATELET
Abs Immature Granulocytes: 0.03 10*3/uL (ref 0.00–0.07)
BASOS PCT: 0 %
Basophils Absolute: 0 10*3/uL (ref 0.0–0.1)
Eosinophils Absolute: 0 10*3/uL (ref 0.0–0.5)
Eosinophils Relative: 0 %
HCT: 50.8 % — ABNORMAL HIGH (ref 36.0–46.0)
HEMOGLOBIN: 16.3 g/dL — AB (ref 12.0–15.0)
IMMATURE GRANULOCYTES: 0 %
LYMPHS PCT: 1 %
Lymphs Abs: 0.2 10*3/uL — ABNORMAL LOW (ref 0.7–4.0)
MCH: 27.7 pg (ref 26.0–34.0)
MCHC: 32.1 g/dL (ref 30.0–36.0)
MCV: 86.2 fL (ref 80.0–100.0)
MONO ABS: 0.2 10*3/uL (ref 0.1–1.0)
MONOS PCT: 1 %
NEUTROS ABS: 14.1 10*3/uL — AB (ref 1.7–7.7)
NEUTROS PCT: 98 %
PLATELETS: 223 10*3/uL (ref 150–400)
RBC: 5.89 MIL/uL — AB (ref 3.87–5.11)
RDW: 12.8 % (ref 11.5–15.5)
WBC: 14.5 10*3/uL — ABNORMAL HIGH (ref 4.0–10.5)
nRBC: 0 % (ref 0.0–0.2)

## 2018-03-12 LAB — URINALYSIS, ROUTINE W REFLEX MICROSCOPIC
Bilirubin Urine: NEGATIVE
GLUCOSE, UA: NEGATIVE mg/dL
Hgb urine dipstick: NEGATIVE
Ketones, ur: 5 mg/dL — AB
LEUKOCYTES UA: NEGATIVE
NITRITE: NEGATIVE
PH: 5 (ref 5.0–8.0)
Protein, ur: NEGATIVE mg/dL
Specific Gravity, Urine: 1.021 (ref 1.005–1.030)

## 2018-03-12 LAB — COMPREHENSIVE METABOLIC PANEL
ALK PHOS: 69 U/L (ref 38–126)
ALT: 28 U/L (ref 0–44)
AST: 38 U/L (ref 15–41)
Albumin: 4.3 g/dL (ref 3.5–5.0)
Anion gap: 11 (ref 5–15)
BUN: 27 mg/dL — ABNORMAL HIGH (ref 8–23)
CALCIUM: 10.6 mg/dL — AB (ref 8.9–10.3)
CHLORIDE: 106 mmol/L (ref 98–111)
CO2: 23 mmol/L (ref 22–32)
CREATININE: 1.18 mg/dL — AB (ref 0.44–1.00)
GFR calc non Af Amer: 46 mL/min — ABNORMAL LOW (ref >60)
GFR, EST AFRICAN AMERICAN: 53 mL/min — AB (ref >60)
Glucose, Bld: 145 mg/dL — ABNORMAL HIGH (ref 70–99)
Potassium: 4.2 mmol/L (ref 3.5–5.1)
SODIUM: 140 mmol/L (ref 135–145)
Total Bilirubin: 1.5 mg/dL — ABNORMAL HIGH (ref 0.3–1.2)
Total Protein: 8.3 g/dL — ABNORMAL HIGH (ref 6.5–8.1)

## 2018-03-12 LAB — I-STAT TROPONIN, ED: Troponin i, poc: 0 ng/mL (ref 0.00–0.08)

## 2018-03-12 LAB — LIPASE, BLOOD: Lipase: 34 U/L (ref 11–51)

## 2018-03-12 MED ORDER — OXYCODONE-ACETAMINOPHEN 5-325 MG PO TABS
1.0000 | ORAL_TABLET | Freq: Once | ORAL | Status: AC
  Administered 2018-03-12: 1 via ORAL
  Filled 2018-03-12: qty 1

## 2018-03-12 MED ORDER — DIAZEPAM 5 MG PO TABS: 2.5000 mg | ORAL_TABLET | Freq: Three times a day (TID) | ORAL | 0 refills | Status: DC | PRN

## 2018-03-12 MED ORDER — OXYCODONE-ACETAMINOPHEN 5-325 MG PO TABS: 1.0000 | ORAL_TABLET | Freq: Four times a day (QID) | ORAL | 0 refills | Status: DC | PRN

## 2018-03-12 MED ORDER — ONDANSETRON HCL 4 MG PO TABS: 4.0000 mg | ORAL_TABLET | Freq: Four times a day (QID) | ORAL | 0 refills | Status: DC

## 2018-03-12 MED ORDER — SODIUM CHLORIDE 0.9 % IV BOLUS
1000.0000 mL | Freq: Once | INTRAVENOUS | Status: AC
  Administered 2018-03-12: 1000 mL via INTRAVENOUS

## 2018-03-12 NOTE — ED Provider Notes (Signed)
Medical screening examination/treatment/procedure(s) were conducted as a shared visit with non-physician practitioner(s) and myself.  I personally evaluated the patient during the encounter.  Clinical Impression:   Final diagnoses:  Fall, initial encounter  Nausea vomiting and diarrhea  Acute bilateral low back pain without sciatica   The patient is a very pleasant 72 year old female, recent myocardial infarction dating to May 2019, she spent some time in cardiac rehab, she has subsequently developed a difficulty with her speech which has been worked up in the outpatient and the inpatient setting as recently as 2 weeks ago and found to be possibly vascular dementia.  The patient at this time complains of having a fall this morning while she was in the bathroom, she lost her balance and fell backwards striking her head and her side, her pain is mostly in the right side but states it is only when she moves, when she is resting she has no pain, no headache, no numbness or weakness.  In the bed the patient is lying down crossing her legs moving all 4 extremities without difficulty.  She has no difficulty with breathing.  She tries to sit up she does have pain radiating into the right flank.  Imaging negative for fractures pneumothorax intracranial injuries or other problems, neurologic exam is stable and normal, the patient is stable for discharge.  We will order home health to help at home, she will be given prescriptions for a bedside commode as well as a wheelchair, I do not think the inability to walk without pain is a very her for her being discharged as she will most likely have pain for quite some time.  The patient in the family member at the bedside, the husband, are in agreement with the plan.   Eber Hong, MD 03/13/18 [phone]

## 2018-03-12 NOTE — ED Notes (Signed)
Patient transported to X-ray 

## 2018-03-12 NOTE — ED Provider Notes (Signed)
[facility] EMERGENCY DEPARTMENT Provider Note   CSN: [id number] Arrival date & time: 03/12/18  1108     History   Chief Complaint Chief Complaint  Patient presents with  . Fall  . Back Pain    HPI Trenda Corliss is a 72 y.o. female with h/o lumbar DDD, NSTEMI s/p stent of LAD, on brillinta, DM, HTN, HLD, recent admission for confusion and word finding difficulty 11/7-11/8 is brought to ER via EMS for fall.  History obtained from EMS, pt and husband at bedside. Pt's family has had vomiting, diarrhea, abdominal pain this last week.  She woke up this morning with sudden urge to vomit and have a BM.  She vomited multiple times and became light-headed when she stood up from the toilet. Reports prodromal tunnel vision prior to falling to the ground but denies frank LOC. She had a fall backwards leading to posterior head and back trauma.  She was found on the ground by her husband.  Fall was at 0600.  She had diffuse body pain, worse in her right thoracic back that radiated to all extremities, severe.  She did not want to come to ER so she took a muscle relaxer which helped for 2 hours but pain returned.  Husband called EMS for evaluation.  Of note, pt has had intermittent word finding difficulty for the last 2 weeks. She was admitted for this and had a normal MRI and work up.  Per discharge summary, confusion and word finding difficulty attributed to polypharmacy vs age related cognitive changes.  Pt states this has been worsening, has been intermittent. This was noted by EMS this morning after fall, but has now resolved.  Husband states pt is refusing to f/u with neurology as outpatient as recommended at hospital discharge.   Pt denies HA, fevers, vision changes, CP, SOB, cough, abdominal pain, blood in her stools, dysuria. No saddle anesthesia, loss of bladder or rectal tone. States she had a BM while on the bathroom but states she had the sudden urge and couldn't hold it any  longer. No unilateral weakness.   HPI  Past Medical History:  Diagnosis Date  . Diabetes mellitus without complication (HCC)   . Heart attack (HCC) 09/07/2017  . Urticaria     Patient Active Problem List   Diagnosis Date Noted  . Vascular dementia with behavior disturbance (HCC) 02/24/2018  . Myalgia 02/09/2018  . Seasonal allergic rhinitis due to pollen 01/21/2018  . Cough 12/23/2017  . Allergic urticaria 12/23/2017  . History of myocardial infarction in last year 12/23/2017  . Diabetes mellitus (HCC) 12/23/2017  . Rash and nonspecific skin eruption 12/23/2017  . Intrinsic atopic dermatitis 12/23/2017  . CAD (coronary artery disease) 09/25/2017  . Low back pain 09/25/2017  . Gastroesophageal reflux disease with esophagitis 09/04/2017  . Hyperlipidemia associated with type 2 diabetes mellitus (HCC) 12/31/2016  . Hypokalemia 10/28/2015  . DDD (degenerative disc disease), lumbosacral 09/13/2015  . Diverticulosis of large intestine 09/13/2015  . Seborrheic dermatitis of scalp 09/13/2015  . Chronic kidney disease, stage III (moderate) (HCC) 05/02/2015  . Type 2 diabetes mellitus without complication, without long-term current use of insulin (HCC) 01/18/2015  . Allergic rhinitis 07/14/2014  . Essential hypertension 07/14/2014  . History of uterine cancer 07/14/2014    Past Surgical History:  Procedure Laterality Date  . ABDOMINAL HYSTERECTOMY  2006   b/l SPO and TAH, uterine cancer  . EYE SURGERY Bilateral 1960  . TUBAL LIGATION  OB History   None      Home Medications    Prior to Admission medications   Medication Sig Start Date End Date Taking? Authorizing Provider  Acetaminophen (TYLENOL PO) Take 500 mg by mouth 2 (two) times daily.    Yes [provider]  aspirin EC 81 MG tablet Take 81 mg by mouth daily.   Yes [provider]  atorvastatin (LIPITOR) 80 MG tablet Take 1 tablet (80 mg total) by mouth daily. 12/26/17  Yes Bradd Canary, MD   Calcium Carbonate-Vit D-Min (CALCIUM 600+D PLUS MINERALS) 600-400 MG-UNIT TABS Take 1 tablet by mouth daily.  09/13/15  Yes [provider]  camphor-menthol Wynelle Fanny) lotion Apply 1 application topically as needed for itching.   Yes [provider]  famotidine (PEPCID) 20 MG tablet Take 1 tablet (20 mg total) by mouth 2 (two) times daily. 01/21/18 07/20/18 Yes Ambs, Norvel Richards, FNP  fexofenadine (ALLEGRA) 180 MG tablet Take 180 mg by mouth at bedtime.   Yes [provider]  lisinopril (PRINIVIL,ZESTRIL) 2.5 MG tablet Take 2.5 mg by mouth daily.   Yes [provider]  methocarbamol (ROBAXIN) 500 MG tablet Take 500 mg by mouth as needed.  10/31/17  Yes [provider]  spironolactone (ALDACTONE) 25 MG tablet Take 25 mg by mouth 2 (two) times daily. 02/25/18  Yes [provider]  ticagrelor (BRILINTA) 90 MG TABS tablet Take 1 tablet (90 mg total) by mouth 2 (two) times daily. 12/26/17  Yes Bradd Canary, MD  triamcinolone cream (KENALOG) 0.1 % Apply 1 application topically as needed (rash).  01/20/18  Yes [provider]  methylPREDNISolone (MEDROL) 4 MG tablet 5 tab po qd X 3d then 4 tab po qd X 3d then 3 tab po qd X 3d then 2 tab po qd x 3 d  then 1 tab po qd x 3 days then stop Patient not taking: Reported on 03/12/2018 02/06/18   Bradd Canary, MD    Family History Family History  Problem Relation Age of Onset  . Cancer Mother        liver cancer, breast cancer x 2   . Alcohol abuse Mother        smoker  . Heart disease Father        MI  . Alcohol abuse Father        smoker  . Depression Sister   . Tremor Sister   . Cancer Brother        pancreatic  . Diabetes Maternal Grandmother   . Hearing loss Maternal Grandmother   . Depression Maternal Grandmother   . Allergic rhinitis Neg Hx   . Angioedema Neg Hx   . Asthma Neg Hx   . Eczema Neg Hx   . Immunodeficiency Neg Hx   . Urticaria Neg Hx     Social History Social History     Tobacco Use  . Smoking status: Former Smoker    Packs/day: 0.50    Years: 24.00    Pack years: 12.00    Types: Cigarettes    Last attempt to quit: 04/17/1987    Years since quitting: 30.9  . Smokeless tobacco: Never Used  Substance Use Topics  . Alcohol use: Not Currently  . Drug use: Not Currently     Allergies   Amoxicillin; Hydroxyzine; and Sulfa antibiotics   Review of Systems Review of Systems  Gastrointestinal: Positive for diarrhea, nausea and vomiting.  Musculoskeletal: Positive for arthralgias, back pain  and myalgias.  Hematological: Bruises/bleeds easily.  All other systems reviewed and are negative.    Physical Exam Updated Vital Signs BP 102/62   Pulse 69   Temp 98.7 F (37.1 C)   Resp (!) 25   Ht 5\' 1"  (1.549 m)   Wt 61.7 kg   SpO2 93%   BMI 25.70 kg/m   Physical Exam  Constitutional: She is oriented to person, place, and time. She appears well-developed and well-nourished. She is cooperative. She is easily aroused. No distress.  HENT:  Head: Atraumatic.  No tenderness or crepitus of facial, nasal, scalp bones. No Raccoon's eyes. No Battle's sign.  No epistaxis or rhinorrhea, septum midline.  No intraoral bleeding or injury. No malocclusion.   Eyes: Conjunctivae are normal.  Lids normal. EOMs and PERRL intact.   Neck:  C-spine: no midline or paraspinal muscular tenderness. Full active ROM of cervical spine w/o pain. Trachea midline  Cardiovascular: Normal rate, regular rhythm, S1 normal, S2 normal and normal heart sounds.  Pulses:      Radial pulses are 1+ on the right side, and 1+ on the left side.       Dorsalis pedis pulses are 1+ on the right side, and 1+ on the left side.  Pulmonary/Chest: Effort normal and breath sounds normal. She has no decreased breath sounds. She exhibits tenderness.  Right thoracic paraspinal muscle and lateral chest wall tenderness   Abdominal: Soft. There is no tenderness.  Musculoskeletal: Normal range of  motion. She exhibits tenderness. She exhibits no deformity.  T-spine: right paraspinal muscular tenderness. No midline tenderness.   L-spine: Midline low tenderness, no paraspinal muscular tenderness.  Pelvis: no instability with AP/L compression, leg shortening or rotation. Full PROM of hips bilaterally without pain.  Neurological: She is alert, oriented to person, place, and time and easily aroused. GCS eye subscore is 4. GCS verbal subscore is 5. GCS motor subscore is 6.  Aphasia, intermittently. This cleared up when pt's husband walked into room.  Alert and oriented to self, place, time and event.  Strength 5/5 in upper and lower extremities   Sensation to light touch intact in bilateral face, upper and lower extremities No pronator drift. No leg drop.  Normal finger-to-nose and finger tapping.  CN II-XII grossly intact bilaterally.  Knee DTR brisk symmetric bilaterally   Skin: Skin is warm and dry. Capillary refill takes less than 2 seconds.  Psychiatric: Her behavior is normal. Thought content normal.     ED Treatments / Results  Labs (all labs ordered are listed, but only abnormal results are displayed) Labs Reviewed  CBC WITH DIFFERENTIAL/PLATELET - Abnormal; Notable for the following components:      Result Value   WBC 14.5 (*)    RBC 5.89 (*)    Hemoglobin 16.3 (*)    HCT 50.8 (*)    Neutro Abs 14.1 (*)    Lymphs Abs 0.2 (*)    All other components within normal limits  COMPREHENSIVE METABOLIC PANEL - Abnormal; Notable for the following components:   Glucose, Bld 145 (*)    BUN 27 (*)    Creatinine, Ser 1.18 (*)    Calcium 10.6 (*)    Total Protein 8.3 (*)    Total Bilirubin 1.5 (*)    GFR calc non Af Amer 46 (*)    GFR calc Af Amer 53 (*)    All other components within normal limits  URINALYSIS, ROUTINE W REFLEX MICROSCOPIC - Abnormal; Notable for the following components:  Ketones, ur 5 (*)    All other components within normal limits  URINE CULTURE  LIPASE,  BLOOD  I-STAT TROPONIN, ED    EKG EKG Interpretation  Date/Time:  Wednesday March 12 2018 11:09:22 EST Ventricular Rate:  80 PR Interval:    QRS Duration: 83 QT Interval:  348 QTC Calculation: 402 R Axis:   72 Text Interpretation:  Sinus rhythm Normal ECG since last tracing no significant change Confirmed by Eber Hong ([id number]) on 03/12/2018 4:14:27 PM   Radiology Dg Ribs Unilateral W/chest Right  Result Date: 03/12/2018 CLINICAL DATA:  Larey Seat when arising from bed due to nausea and vomiting. Generalized pain. EXAM: RIGHT RIBS AND CHEST - 3+ VIEW COMPARISON:  Thoracic spine series of today's date and chest x-ray of February 20, 2018. FINDINGS: The lungs are adequately inflated. The interstitial markings are mildly prominent. There is no alveolar infiltrate or pleural effusion. The heart and pulmonary vascularity are normal. There is calcification in the wall of the aortic arch. Right rib detail images reveal no acute displaced or nondisplaced rib fractures. IMPRESSION: There is no acute right rib fracture. There are mild chronic bronchitic changes. No acute cardiopulmonary abnormality. Thoracic aortic atherosclerosis. Electronically Signed   By: David  [place] M.D.   On: 03/12/2018 13:26   Dg Thoracic Spine 2 View  Result Date: 03/12/2018 CLINICAL DATA:  Larey Seat at about 0600 hours getting out of bed to vomit, fell backwards after vomiting striking back and head on wall, slurred speech, confusion, history diabetes mellitus MI, smoker EXAM: THORACIC SPINE 2 VIEWS COMPARISON:  Chest radiographs 02/20/2018 FINDINGS: 12 pairs of ribs. Bones demineralized. Scattered degenerative disc disease changes. No thoracic spine fracture, subluxation or bone destruction. Atherosclerotic calcification aorta. IMPRESSION: Degenerative disc disease changes thoracic spine. No acute abnormalities. Electronically Signed   By: Ulyses Southward M.D.   On: 03/12/2018 13:29   Dg Lumbar Spine Complete  Result Date:  03/12/2018 CLINICAL DATA:  The patient fell when getting out of bed 2 volumen and fell backwards hitting the head and back on a wall. Generalized pain. Mental status change for the past 2 weeks. EXAM: LUMBAR SPINE - COMPLETE 4+ VIEW COMPARISON:  Coronal and sagittal reconstructed images through the lumbar spine from an abdominal and pelvic CT scan dated Sep 08, 2015 FINDINGS: The lumbar vertebral bodies are preserved in height. There is stable grade 1 anterolisthesis of L4 with respect L5. There is no high-grade disc space narrowing. There is facet joint hypertrophy at L4-5 and L5-S1. The pedicles and transverse processes are intact where visualized. IMPRESSION: There is no acute bony abnormality of the lumbar spine. Mild chronic changes as described. Electronically Signed   By: David  [place] M.D.   On: 03/12/2018 13:22   Dg Sacrum/coccyx  Result Date: 03/12/2018 CLINICAL DATA:  Fall with lumbar spine tenderness EXAM: SACRUM AND COCCYX - 2+ VIEW COMPARISON:  Abdominal CT 09/08/2015 FINDINGS: No evidence of sacral fracture or sacroiliac diastasis. Lower lumbar facet arthropathy with L4-5 grade 1 anterolisthesis that has been seen since 2017. IMPRESSION: No acute finding. Electronically Signed   By: Marnee Spring M.D.   On: 03/12/2018 13:26   Ct Head Wo Contrast  Result Date: 03/12/2018 CLINICAL DATA:  Larey Seat at home today, struck back of head on wall. On blood thinners. Confusion for 2 weeks. History of diabetes. EXAM: CT HEAD WITHOUT CONTRAST CT CERVICAL SPINE WITHOUT CONTRAST TECHNIQUE: Multidetector CT imaging of the head and cervical spine was performed following the standard protocol without intravenous contrast.  Multiplanar CT image reconstructions of the cervical spine were also generated. COMPARISON:  MRI of the head February 20, 2018 FINDINGS: CT HEAD FINDINGS BRAIN: No intraparenchymal hemorrhage, mass effect nor midline shift. The ventricles and sulci are normal for age. Patchy supratentorial  white matter hypodensities within normal range for patient's age, though non-specific are most compatible with chronic small vessel ischemic disease. No acute large vascular territory infarcts. No abnormal extra-axial fluid collections. Basal cisterns are patent. VASCULAR: Mild calcific atherosclerosis of the carotid siphons. SKULL: No skull fracture. No significant scalp soft tissue swelling. SINUSES/ORBITS: Trace paranasal sinus mucosal thickening. Mastoid air cells are well aerated.The included ocular globes and orbital contents are non-suspicious. OTHER: None. CT CERVICAL SPINE FINDINGS ALIGNMENT: Maintained lordosis. Vertebral bodies in alignment. SKULL BASE AND VERTEBRAE: Cervical vertebral bodies and posterior elements are intact. Moderate C5-6 disc height loss with endplate spurring compatible with degenerative disc. No destructive bony lesions. C1-2 articulation maintained. SOFT TISSUES AND SPINAL CANAL: Nonacute. Nuchal ligament calcification. Mildly calcified aortic arch. DISC LEVELS: No significant osseous canal stenosis or neural foraminal narrowing. UPPER CHEST: Lung apices are clear. OTHER: None. IMPRESSION: CT HEAD: 1. Normal noncontrast CT HEAD. CT CERVICAL SPINE: 1. No fracture or malalignment. Aortic Atherosclerosis (ICD10-I70.0). Electronically Signed   By: Awilda Metro M.D.   On: 03/12/2018 13:49   Ct Cervical Spine Wo Contrast  Result Date: 03/12/2018 CLINICAL DATA:  Larey Seat at home today, struck back of head on wall. On blood thinners. Confusion for 2 weeks. History of diabetes. EXAM: CT HEAD WITHOUT CONTRAST CT CERVICAL SPINE WITHOUT CONTRAST TECHNIQUE: Multidetector CT imaging of the head and cervical spine was performed following the standard protocol without intravenous contrast. Multiplanar CT image reconstructions of the cervical spine were also generated. COMPARISON:  MRI of the head February 20, 2018 FINDINGS: CT HEAD FINDINGS BRAIN: No intraparenchymal hemorrhage, mass effect  nor midline shift. The ventricles and sulci are normal for age. Patchy supratentorial white matter hypodensities within normal range for patient's age, though non-specific are most compatible with chronic small vessel ischemic disease. No acute large vascular territory infarcts. No abnormal extra-axial fluid collections. Basal cisterns are patent. VASCULAR: Mild calcific atherosclerosis of the carotid siphons. SKULL: No skull fracture. No significant scalp soft tissue swelling. SINUSES/ORBITS: Trace paranasal sinus mucosal thickening. Mastoid air cells are well aerated.The included ocular globes and orbital contents are non-suspicious. OTHER: None. CT CERVICAL SPINE FINDINGS ALIGNMENT: Maintained lordosis. Vertebral bodies in alignment. SKULL BASE AND VERTEBRAE: Cervical vertebral bodies and posterior elements are intact. Moderate C5-6 disc height loss with endplate spurring compatible with degenerative disc. No destructive bony lesions. C1-2 articulation maintained. SOFT TISSUES AND SPINAL CANAL: Nonacute. Nuchal ligament calcification. Mildly calcified aortic arch. DISC LEVELS: No significant osseous canal stenosis or neural foraminal narrowing. UPPER CHEST: Lung apices are clear. OTHER: None. IMPRESSION: CT HEAD: 1. Normal noncontrast CT HEAD. CT CERVICAL SPINE: 1. No fracture or malalignment. Aortic Atherosclerosis (ICD10-I70.0). Electronically Signed   By: Awilda Metro M.D.   On: 03/12/2018 13:49    Procedures Procedures (including critical care time)  Medications Ordered in ED Medications  sodium chloride 0.9 % bolus 1,000 mL (0 mLs Intravenous Stopped 03/12/18 1416)  oxyCODONE-acetaminophen (PERCOCET/ROXICET) 5-325 MG per tablet 1 tablet (1 tablet Oral Given 03/12/18 1430)     Initial Impression / Assessment and Plan / ED Course  I have reviewed the triage vital signs and the nursing notes.  Pertinent labs & imaging results that were available during my care of the patient were  reviewed  by me and considered in my medical decision making (see chart for details).  Clinical Course as of Mar 12 1618  Wed Mar 12, 2018  1130 Husband states pt was at cardiology appointment 2 weeks ago, noticed confusion and speech changes, sent to ER. She was admitted for confusion and speech finding difficulty and had an MRI. Pt's MRI was negative. Symptoms improved and she was discharged. Suspected symptoms are multifactorial with polypharmacy vs underlying cognitive impairement/degenerative d/o. Pt was seen by neuro with no additional inpt recs other than close f/up as outpt for further eval for cognitive deficits as above.   [CG]  1313 WBC(!): 14.5 [CG]  1313 Hemoglobin(!): 16.3 [CG]  1313 HCT(!): 50.8 [CG]  1313 Creatinine(!): 1.18 [CG]  1313 GFR, Est Non African American(!): 46 [CG]  1338 No acute abnormalities, chronic changes   DG Lumbar Spine Complete [CG]  1339 No acute abnormalities, mild bronchitic changes. No rib fx  DG Ribs Unilateral W/Chest Right [CG]  1339 No fx   DG Sacrum/Coccyx [CG]  1339 DDD  DG Thoracic Spine 2 View [CG]  1406 Re-evaluated pt. She has no pain, nausea, vomiting. I&O in process.  Discussed work up with pt and husband.  Will wait for UA, attempt to ambulate.    [CG]    Clinical Course User Index [CG] Liberty Handy, PA-C   72 yo with sudden onset n/v/d this AM likely viral process given known multiple sick contacts at home, followed by what sounds like vasovagal episode without frank syncope.  +head and back trauma.  On Brilinta.  She has had intermittent confusion/word finding difficulty for 2 weeks with recent admission, negative MRI. She has been evaluated by neuro at [facility] who suspect neurodegenerative changes/impairment, and recommended f/u with outpatient neuro for neuropsycho evaluation. She reports severe diffuse body pain worse in her back with movement, she has no pain at rest.   1615: WBC 14.5 likely from viral/reactive process.  Creatinine 1.18,  BUN only slightly elevated.  Patient does not have any abdominal pain.  Negative Murphy's and McBurney's and I doubt appendicitis, cholecystitis.  Urinalysis with minimal ketones but no infection.  Remaining work-up is reassuring.  Patient has been evaluated multiple times.  At rest she has no complaints/[pain.  Discussed results with patient and husband.  They are concerned about the significant amount of pain that she has with movement and ambulation.  We ambulated the patient in the ER and she ambulated on her own, at baseline reporting a lot of pain with movement but none at rest.  This is in setting of chronic back pain, chronic deconditioning.  She has no cauda equina symptoms, and there is no indication for emergent/further imaging here.  We will discharge with wheelchair, bedside commode order.  I have also placed a face-to-face order for patient to be evaluated at home for any further needs.  Husband states they have neighbors that can assist with transfers at come.  No indication found for admission.  Pt shared with Dr Hyacinth Meeker.  Final Clinical Impressions(s) / ED Diagnoses   Final diagnoses:  Fall, initial encounter  Nausea vomiting and diarrhea  Acute bilateral low back pain without sciatica    ED Discharge Orders         Ordered    Wheelchair     03/12/18 1546    Commode chair     03/12/18 1546           Liberty Handy, [place] 03/12/18 1619  Eber Hong, MD 03/13/18 [phone]

## 2018-03-12 NOTE — ED Notes (Signed)
Pt unable to tolerate bedpan due to back pain. Purewick placed for pt to void.

## 2018-03-12 NOTE — ED Notes (Signed)
Got patient undress on the monitor did ekg shown to Dr Mesner patient is resting with call bell in reach 

## 2018-03-12 NOTE — ED Notes (Signed)
Pt takes 15 + minutes to return to bed. Pt has back pain with movement,. Will inform provider.

## 2018-03-12 NOTE — ED Notes (Signed)
Pt ambulates approx 20 feet with no change in sat or hr. Pt has dificulty with movement secondary to pain with sitting or standing. Very slow ambulation with moderate to poor balance.

## 2018-03-12 NOTE — ED Triage Notes (Signed)
Pt arrives EMS from home with c/o fall about 6 am when out of bed to vomit. Pt fell backward after vomitting hitting back and head on wall.c/o pain all over and given muscle relaxer by family. Multiple family members sick with vomiting. Pt takes blood thinners. Per husband states slurred speech and confusion x 2 weeks after noted at cardiologist office.

## 2018-03-12 NOTE — Discharge Instructions (Addendum)
You were seen in the ER after a fall and nausea, vomiting, diarrhea.  Imaging today of head, cervical spine, thoracic spine, lumbar spine does not show any new abnormalities.  Labs look normal. No pneumonia and no urinary tract infection.   Your pain is likely from muscular contusions, bruising, spasms. Continue tylenol, percocet, methocarbamol. Rest. Caution with ambulation to avoid another fall.   I have given you a note for wheel chair and bedside commode to use at home.  I have also ordered a home "face to face" for someone to visit you to determine any other needs at home.   Return for fever, groin numbness, loss of bladder or rectal control/tone, weakness to one side of body

## 2018-03-12 NOTE — ED Notes (Addendum)
Attempted to ambulate pt in hall, but pt could not even sit up on the side of bed due to pain in back

## 2018-03-13 LAB — URINE CULTURE: Culture: NO GROWTH

## 2018-04-04 ENCOUNTER — Encounter

## 2018-04-04 ENCOUNTER — Ambulatory Visit: Payer: Medicare Other | Admitting: Family Medicine

## 2018-04-04 VITALS — BP 140/62 | HR 53 | Temp 97.8°F | Resp 18 | Wt 135.2 lb

## 2018-04-04 DIAGNOSIS — E785 Hyperlipidemia, unspecified: Secondary | ICD-10-CM | POA: Diagnosis not present

## 2018-04-04 DIAGNOSIS — E1169 Type 2 diabetes mellitus with other specified complication: Secondary | ICD-10-CM

## 2018-04-04 DIAGNOSIS — N183 Chronic kidney disease, stage 3 unspecified: Secondary | ICD-10-CM

## 2018-04-04 DIAGNOSIS — M545 Low back pain, unspecified: Secondary | ICD-10-CM

## 2018-04-04 DIAGNOSIS — E119 Type 2 diabetes mellitus without complications: Secondary | ICD-10-CM | POA: Diagnosis not present

## 2018-04-04 DIAGNOSIS — I10 Essential (primary) hypertension: Secondary | ICD-10-CM | POA: Diagnosis not present

## 2018-04-04 DIAGNOSIS — M533 Sacrococcygeal disorders, not elsewhere classified: Secondary | ICD-10-CM

## 2018-04-04 DIAGNOSIS — F0151 Vascular dementia with behavioral disturbance: Secondary | ICD-10-CM

## 2018-04-04 DIAGNOSIS — F01518 Vascular dementia, unspecified severity, with other behavioral disturbance: Secondary | ICD-10-CM

## 2018-04-04 LAB — COMPREHENSIVE METABOLIC PANEL
ALT: 16 U/L (ref 0–35)
AST: 26 U/L (ref 0–37)
Albumin: 4.1 g/dL (ref 3.5–5.2)
Alkaline Phosphatase: 68 U/L (ref 39–117)
BUN: 16 mg/dL (ref 6–23)
CO2: 30 meq/L (ref 19–32)
CREATININE: 0.89 mg/dL (ref 0.40–1.20)
Calcium: 10 mg/dL (ref 8.4–10.5)
Chloride: 106 mEq/L (ref 96–112)
GFR: 66.16 mL/min (ref >60.00)
GLUCOSE: 113 mg/dL — AB (ref 70–99)
Potassium: 4 mEq/L (ref 3.5–5.1)
SODIUM: 143 meq/L (ref 135–145)
Total Bilirubin: 1.2 mg/dL (ref 0.2–1.2)
Total Protein: 7.1 g/dL (ref 6.0–8.3)

## 2018-04-04 LAB — CBC
HCT: 42.1 % (ref 36.0–46.0)
Hemoglobin: 14.3 g/dL (ref 12.0–15.0)
MCHC: 34 g/dL (ref 30.0–36.0)
MCV: 86.2 fl (ref 78.0–100.0)
Platelets: 212 10*3/uL (ref 150.0–400.0)
RBC: 4.88 Mil/uL (ref 3.87–5.11)
RDW: 14.1 % (ref 11.5–15.5)
WBC: 5.6 10*3/uL (ref 4.0–10.5)

## 2018-04-04 LAB — HEMOGLOBIN A1C: HEMOGLOBIN A1C: 6.2 % (ref 4.6–6.5)

## 2018-04-04 LAB — LIPID PANEL
CHOL/HDL RATIO: 3
Cholesterol: 100 mg/dL (ref 0–200)
HDL: 39.8 mg/dL (ref >39.00)
LDL Cholesterol: 46 mg/dL (ref 0–99)
NONHDL: 60.27
Triglycerides: 72 mg/dL (ref 0.0–149.0)
VLDL: 14.4 mg/dL (ref 0.0–40.0)

## 2018-04-04 LAB — TSH: TSH: 0.83 u[IU]/mL (ref 0.35–4.50)

## 2018-04-04 NOTE — Assessment & Plan Note (Signed)
Well controlled, no changes to meds. Encouraged heart healthy diet such as the DASH diet and exercise as tolerated.  °

## 2018-04-04 NOTE — Assessment & Plan Note (Signed)
Monitor and hydrate 

## 2018-04-04 NOTE — Patient Instructions (Addendum)
Consider a biopsy of your hives during a bad flare. Call dermatology for an appt to have this done.  Sacroiliac Joint Dysfunction  Sacroiliac joint dysfunction is a condition that causes inflammation on one or both sides of the sacroiliac (SI) joint. The SI joint connects the lower part of the spine (sacrum) with the two upper portions of the pelvis (ilium). This condition causes deep aching or burning pain in the low back. In some cases, the pain may also spread into one or both buttocks, hips, or thighs. What are the causes? This condition may be caused by:  Pregnancy. During pregnancy, extra stress is put on the SI joints because the pelvis widens.  Injury, such as: ? Injuries from car accidents. ? Sports-related injuries. ? Work-related injuries.  Having one leg that is shorter than the other.  Conditions that affect the joints, such as: ? Rheumatoid arthritis. ? Gout. ? Psoriatic arthritis. ? Joint infection (septic arthritis). Sometimes, the cause of SI joint dysfunction is not known. What are the signs or symptoms? Symptoms of this condition include:  Aching or burning pain in the lower back. The pain may also spread to other areas, such as: ? Buttocks. ? Groin. ? Thighs.  Muscle spasms in or around the painful areas.  Increased pain when standing, walking, running, stair climbing, bending, or lifting. How is this diagnosed? This condition is diagnosed with a physical exam and medical history. During the exam, the health care provider may move one or both of your legs to different positions to check for pain. Various tests may be done to confirm the diagnosis, including:  Imaging tests to look for other causes of pain. These may include: ? MRI. ? CT scan. ? Bone scan.  Diagnostic injection. A numbing medicine is injected into the SI joint using a needle. If your pain is temporarily improved or stopped after the injection, this can indicate that SI joint dysfunction is  the problem. How is this treated? Treatment depends on the cause and severity of your condition. Treatment options may include:  Ice or heat applied to the lower back area after an injury. This may help reduce pain and muscle spasms.  Medicines to relieve pain or inflammation or to relax the muscles.  Wearing a back brace (sacroiliac brace) to help support the joint while your back is healing.  Physical therapy to increase muscle strength around the joint and flexibility at the joint. This may also involve learning proper body positions and ways of moving to relieve stress on the joint.  Direct manipulation of the SI joint.  Injections of steroid medicine into the joint to reduce pain and swelling.  Radiofrequency ablation to burn away nerves that are carrying pain messages from the joint.  Use of a device that provides electrical stimulation to help reduce pain at the joint.  Surgery to put in screws and plates that limit or prevent joint motion. This is rare. Follow these instructions at home: Medicines  Take over-the-counter and prescription medicines only as told by your health care provider.  Do not drive or use heavy machinery while taking prescription pain medicine.  If you are taking prescription pain medicine, take actions to prevent or treat constipation. Your health care provider may recommend that you: ? Drink enough fluid to keep your urine pale yellow. ? Eat foods that are high in fiber, such as fresh fruits and vegetables, whole grains, and beans. ? Limit foods that are high in fat and processed sugars, such  as fried or sweet foods. ? Take an over-the-counter or prescription medicine for constipation. If you have a brace:  Wear the brace as told by your health care provider. Remove it only as told by your health care provider.  Keep the brace clean.  If the brace is not waterproof: ? Do not let it get wet. ? Cover it with a watertight covering when you take a  bath or a shower. Managing pain, stiffness, and swelling      Icing can help with pain and swelling. Heat may help with muscle tension or spasms. Ask your health care provider if you should use ice or heat.  If directed, put ice on the affected area: ? If you have a removable brace, remove it as told by your health care provider. ? Put ice in a plastic bag. ? Place a towel between your skin and the bag. ? Leave the ice on for 20 minutes, 2-3 times a day.  If directed, apply heat to the affected area. Use the heat source that your health care provider recommends, such as a moist heat pack or a heating pad. ? Place a towel between your skin and the heat source. ? Leave the heat on for 20-30 minutes. ? Remove the heat if your skin turns bright red. This is especially important if you are unable to feel pain, heat, or cold. You may have a greater risk of getting burned. General instructions  Rest as needed. Ask your health care provider what activities are safe for you.  Return to your normal activities as told by your health care provider.  Exercise as directed by your health care provider or physical therapist.  Do not use any products that contain nicotine or tobacco, such as cigarettes and e-cigarettes. These can delay bone healing. If you need help quitting, ask your health care provider.  Keep all follow-up visits as told by your health care provider. This is important. Contact a health care provider if:  Your pain is not controlled with medicine.  You have a fever.  Your pain is getting worse. Get help right away if:  You have weakness, numbness, or tingling in your legs or feet.  You lose control of your bladder or bowel. Summary  Sacroiliac joint dysfunction is a condition that causes inflammation on one or both sides of the sacroiliac (SI) joint.  This condition causes deep aching or burning pain in the low back. In some cases, the pain may also spread into one or  both buttocks, hips, or thighs.  Treatment depends on the cause and severity of your condition. It may include medicines to reduce pain and swelling or to relax muscles. This information is not intended to replace advice given to you by your health care provider. Make sure you discuss any questions you have with your health care provider. Document Released: 06/29/2008 Document Revised: 05/13/2017 Document Reviewed: 05/13/2017 Elsevier Interactive Patient Education  2019 [employer]

## 2018-04-04 NOTE — Assessment & Plan Note (Signed)
hgba1c acceptable, minimize simple carbs. Increase exercise as tolerated. Continue current meds 

## 2018-04-07 NOTE — Assessment & Plan Note (Signed)
She is accopanied by her husband and they are willing to have a referral to neurology, reviewed imaging with patient and the need for regular exercise, adequate sleep, learning a new skill etc. she has had episodes of what sounds like delirium during recent hospitalizations but then she clears upon returning home again.

## 2018-04-07 NOTE — Progress Notes (Signed)
Subjective:    Patient ID: Kristina Lee, female    DOB: October 18, 1945, 72 y.o.   MRN: [id number]  No chief complaint on file.   HPI Patient is in today for follow up. She is accompanied by her her husband and they are worried about her memory. She reports episodes of delirium, MS changes while acutely ill and hospitalized over the past months but then when home and healthy she still functions well with no difficulties completing tasks or major concerns. They were initially referred to neurology in [place] by another provider but did not go. Agrees to go to one here in [place]. She is feeling well today. Notes persistent low back and hip pain but no recent fall or injury.  No incontinence or radiculopathy.  She was started on Rogaine by her dermatologist Dr. Katrinka Blazing but has stopped it temporarily and would like to restart.  Has occasional trouble with hives and is a little bit itchy today but generally has been doing well in this regard. Denies CP/palp/SOB/HA/congestion/fevers/GI or GU c/o. Taking meds as prescribed  Past Medical History:  Diagnosis Date  . Diabetes mellitus without complication (HCC)   . Heart attack (HCC) 09/07/2017  . Urticaria     Past Surgical History:  Procedure Laterality Date  . ABDOMINAL HYSTERECTOMY  2006   b/l SPO and TAH, uterine cancer  . EYE SURGERY Bilateral 1960  . TUBAL LIGATION      Family History  Problem Relation Age of Onset  . Cancer Mother        liver cancer, breast cancer x 2   . Alcohol abuse Mother        smoker  . Heart disease Father        MI  . Alcohol abuse Father        smoker  . Depression Sister   . Tremor Sister   . Cancer Brother        pancreatic  . Diabetes Maternal Grandmother   . Hearing loss Maternal Grandmother   . Depression Maternal Grandmother   . Allergic rhinitis Neg Hx   . Angioedema Neg Hx   . Asthma Neg Hx   . Eczema Neg Hx   . Immunodeficiency Neg Hx   . Urticaria Neg Hx     Social History   Socioeconomic  History  . Marital status: Married    Spouse name: Not on file  . Number of children: Not on file  . Years of education: Not on file  . Highest education level: Not on file  Occupational History  . Not on file  Social Needs  . Financial resource strain: Not on file  . Food insecurity:    Worry: Not on file    Inability: Not on file  . Transportation needs:    Medical: Not on file    Non-medical: Not on file  Tobacco Use  . Smoking status: Former Smoker    Packs/day: 0.50    Years: 24.00    Pack years: 12.00    Types: Cigarettes    Last attempt to quit: 04/17/1987    Years since quitting: 30.9  . Smokeless tobacco: Never Used  Substance and Sexual Activity  . Alcohol use: Not Currently  . Drug use: Not Currently  . Sexual activity: Yes  Lifestyle  . Physical activity:    Days per week: Not on file    Minutes per session: Not on file  . Stress: Not on file  Relationships  .  Social connections:    Talks on phone: Not on file    Gets together: Not on file    Attends religious service: Not on file    Active member of club or organization: Not on file    Attends meetings of clubs or organizations: Not on file    Relationship status: Not on file  . Intimate partner violence:    Fear of current or ex partner: Not on file    Emotionally abused: Not on file    Physically abused: Not on file    Forced sexual activity: Not on file  Other Topics Concern  . Not on file  Social History Narrative  . Not on file    Outpatient Medications Prior to Visit  Medication Sig Dispense Refill  . Acetaminophen (TYLENOL PO) Take 500 mg by mouth 2 (two) times daily.     Marland Kitchen. aspirin EC 81 MG tablet Take 81 mg by mouth daily.    Marland Kitchen. atorvastatin (LIPITOR) 80 MG tablet Take 1 tablet (80 mg total) by mouth daily. 90 tablet 1  . Calcium Carbonate-Vit D-Min (CALCIUM 600+D PLUS MINERALS) 600-400 MG-UNIT TABS Take 1 tablet by mouth daily.     . camphor-menthol (SARNA) lotion Apply 1 application  topically as needed for itching.    . famotidine (PEPCID) 20 MG tablet Take 1 tablet (20 mg total) by mouth 2 (two) times daily. 60 tablet 5  . fexofenadine (ALLEGRA) 180 MG tablet Take 180 mg by mouth at bedtime.    Marland Kitchen. lisinopril (PRINIVIL,ZESTRIL) 2.5 MG tablet Take 2.5 mg by mouth daily.    . methocarbamol (ROBAXIN) 500 MG tablet Take 500 mg by mouth as needed.     . methylPREDNISolone (MEDROL) 4 MG tablet 5 tab po qd X 3d then 4 tab po qd X 3d then 3 tab po qd X 3d then 2 tab po qd x 3 d  then 1 tab po qd x 3 days then stop 45 tablet 1  . ondansetron (ZOFRAN) 4 MG tablet Take 1 tablet (4 mg total) by mouth every 6 (six) hours. 12 tablet 0  . oxyCODONE-acetaminophen (PERCOCET/ROXICET) 5-325 MG tablet Take 1 tablet by mouth every 6 (six) hours as needed for severe pain. 10 tablet 0  . spironolactone (ALDACTONE) 25 MG tablet Take 25 mg by mouth 2 (two) times daily.  5  . ticagrelor (BRILINTA) 90 MG TABS tablet Take 1 tablet (90 mg total) by mouth 2 (two) times daily. 180 tablet 1  . triamcinolone cream (KENALOG) 0.1 % Apply 1 application topically as needed (rash).     . diazepam (VALIUM) 5 MG tablet Take 0.5 tablets (2.5 mg total) by mouth every 8 (eight) hours as needed for muscle spasms. 10 tablet 0   No facility-administered medications prior to visit.     Allergies  Allergen Reactions  . Amoxicillin Anaphylaxis  . Hydroxyzine Itching and Other (See Comments)    Other reaction(s): Other (See Comments) Sedation Sedation Sedation   . Sulfa Antibiotics Swelling    Other reaction(s): Swelling tongue tongue tongue     Review of Systems  Constitutional: Negative for fever and malaise/fatigue.  HENT: Negative for congestion.   Eyes: Negative for blurred vision.  Respiratory: Negative for shortness of breath.   Cardiovascular: Negative for chest pain, palpitations and leg swelling.  Gastrointestinal: Negative for abdominal pain, blood in stool and nausea.  Genitourinary: Negative  for dysuria and frequency.  Musculoskeletal: Positive for back pain and joint pain. Negative for falls.  Skin: Negative for rash.  Neurological: Negative for dizziness, loss of consciousness and headaches.  Endo/Heme/Allergies: Negative for environmental allergies.  Psychiatric/Behavioral: Positive for memory loss. Negative for depression. The patient is nervous/anxious.        Objective:    Physical Exam Vitals signs and nursing note reviewed.  Constitutional:      General: She is not in acute distress.    Appearance: She is well-developed.  HENT:     Head: Normocephalic and atraumatic.     Nose: Nose normal.  Eyes:     General:        Right eye: No discharge.        Left eye: No discharge.  Neck:     Musculoskeletal: Normal range of motion and neck supple.  Cardiovascular:     Rate and Rhythm: Normal rate and regular rhythm.     Heart sounds: No murmur.  Pulmonary:     Effort: Pulmonary effort is normal.     Breath sounds: Normal breath sounds.  Abdominal:     General: Bowel sounds are normal.     Palpations: Abdomen is soft.     Tenderness: There is no abdominal tenderness.  Skin:    General: Skin is warm and dry.  Neurological:     Mental Status: She is alert and oriented to person, place, and time.     BP 140/62 (BP Location: Left Arm, Patient Position: Sitting, Cuff Size: Normal)   Pulse (!) 53   Temp 97.8 F (36.6 C) (Oral)   Resp 18   Wt 135 lb 3.2 oz (61.3 kg)   SpO2 98%   BMI 25.55 kg/m  Wt Readings from Last 3 Encounters:  04/04/18 135 lb 3.2 oz (61.3 kg)  03/12/18 136 lb 0.4 oz (61.7 kg)  02/24/18 136 lb (61.7 kg)     Lab Results  Component Value Date   WBC 5.6 04/04/2018   HGB 14.3 04/04/2018   HCT 42.1 04/04/2018   PLT 212.0 04/04/2018   GLUCOSE 113 (H) 04/04/2018   CHOL 100 04/04/2018   TRIG 72.0 04/04/2018   HDL 39.80 04/04/2018   LDLCALC 46 04/04/2018   ALT 16 04/04/2018   AST 26 04/04/2018   NA 143 04/04/2018   K 4.0 04/04/2018    CL 106 04/04/2018   CREATININE 0.89 04/04/2018   BUN 16 04/04/2018   CO2 30 04/04/2018   TSH 0.83 04/04/2018   HGBA1C 6.2 04/04/2018    Lab Results  Component Value Date   TSH 0.83 04/04/2018   Lab Results  Component Value Date   WBC 5.6 04/04/2018   HGB 14.3 04/04/2018   HCT 42.1 04/04/2018   MCV 86.2 04/04/2018   PLT 212.0 04/04/2018   Lab Results  Component Value Date   NA 143 04/04/2018   K 4.0 04/04/2018   CO2 30 04/04/2018   GLUCOSE 113 (H) 04/04/2018   BUN 16 04/04/2018   CREATININE 0.89 04/04/2018   BILITOT 1.2 04/04/2018   ALKPHOS 68 04/04/2018   AST 26 04/04/2018   ALT 16 04/04/2018   PROT 7.1 04/04/2018   ALBUMIN 4.1 04/04/2018   CALCIUM 10.0 04/04/2018   ANIONGAP 11 03/12/2018   GFR 66.16 04/04/2018   Lab Results  Component Value Date   CHOL 100 04/04/2018   Lab Results  Component Value Date   HDL 39.80 04/04/2018   Lab Results  Component Value Date   LDLCALC 46 04/04/2018   Lab Results  Component Value Date  TRIG 72.0 04/04/2018   Lab Results  Component Value Date   CHOLHDL 3 04/04/2018   Lab Results  Component Value Date   HGBA1C 6.2 04/04/2018       Assessment & Plan:   Problem List Items Addressed This Visit    Chronic kidney disease, stage III (moderate) (HCC)    Monitor and hydrate      Essential hypertension    Well controlled, no changes to meds. Encouraged heart healthy diet such as the DASH diet and exercise as tolerated.       Relevant Orders   CBC (Completed)   Comprehensive metabolic panel (Completed)   TSH (Completed)   Hyperlipidemia associated with type 2 diabetes mellitus (HCC)   Relevant Orders   Lipid panel (Completed)   Type 2 diabetes mellitus without complication, without long-term current use of insulin (HCC)    hgba1c acceptable, minimize simple carbs. Increase exercise as tolerated. Continue current meds      Relevant Orders   Hemoglobin A1c (Completed)   Low back pain   Relevant Orders     Ambulatory referral to Sports Medicine   Vascular dementia with behavior disturbance (HCC) - Primary    She is accopanied by her husband and they are willing to have a referral to neurology, reviewed imaging with patient and the need for regular exercise, adequate sleep, learning a new skill etc. she has had episodes of what sounds like delirium during recent hospitalizations but then she clears upon returning home again.       Relevant Orders   Ambulatory referral to Neurology    Other Visit Diagnoses    Sacroiliac dysfunction       Relevant Orders   Ambulatory referral to Sports Medicine      I have discontinued Bosie Clos A. Falotico's diazepam. I am also having her maintain her aspirin EC, lisinopril, CALCIUM 600+D PLUS MINERALS, methocarbamol, fexofenadine, camphor-menthol, atorvastatin, ticagrelor, triamcinolone cream, Acetaminophen (TYLENOL PO), famotidine, methylPREDNISolone, spironolactone, oxyCODONE-acetaminophen, and ondansetron.  No orders of the defined types were placed in this encounter.    Danise Edge, MD

## 2018-04-17 ENCOUNTER — Ambulatory Visit: Payer: Medicare Other | Admitting: Family Medicine

## 2018-04-17 ENCOUNTER — Encounter: Payer: Self-pay | Admitting: Family Medicine

## 2018-04-17 ENCOUNTER — Other Ambulatory Visit: Payer: Self-pay | Admitting: Family Medicine

## 2018-04-17 VITALS — BP 152/99 | HR 53 | Ht 62.0 in | Wt 131.0 lb

## 2018-04-17 DIAGNOSIS — M7631 Iliotibial band syndrome, right leg: Secondary | ICD-10-CM

## 2018-04-17 DIAGNOSIS — M545 Low back pain, unspecified: Secondary | ICD-10-CM

## 2018-04-17 MED ORDER — DICLOFENAC SODIUM 1 % TD GEL: 4.0000 g | Freq: Four times a day (QID) | TRANSDERMAL | 1 refills | Status: DC

## 2018-04-17 MED ORDER — LISINOPRIL 2.5 MG PO TABS: 2.5000 mg | ORAL_TABLET | Freq: Every day | ORAL | 1 refills | Status: DC

## 2018-04-17 NOTE — Progress Notes (Signed)
PCP: Bradd Canary, MD  Subjective:   HPI: Patient is a 73 y.o. female here for low back, hip pain.  Patient here with her husband. They report she's had problems with her low back dating to her birthday in May 2017. She woke up with fairly severe pain. Sought care and told she had 4 degenerative discs. Recalls seeing ortho and doing physical therapy, had good success and pain resolved. Similar issue in 2018, did well with physical therapy. Current pain may have started early in 2019. She had an episode in here of severe pain posterior bilateral back more to right side radiating into her knees. Couldn't bear weight for about 2 weeks. No bowel/bladder dysfunction. No skin changes, numbness. Pain level up to 6/10 on right, 1/10 on left.  Past Medical History:  Diagnosis Date  . Diabetes mellitus without complication (HCC)   . Heart attack (HCC) 09/07/2017  . Urticaria     Current Outpatient Medications on File Prior to Visit  Medication Sig Dispense Refill  . Acetaminophen (TYLENOL PO) Take 500 mg by mouth 2 (two) times daily.     Marland Kitchen aspirin EC 81 MG tablet Take 81 mg by mouth daily.    Marland Kitchen atorvastatin (LIPITOR) 80 MG tablet Take 1 tablet (80 mg total) by mouth daily. 90 tablet 1  . Calcium Carbonate-Vit D-Min (CALCIUM 600+D PLUS MINERALS) 600-400 MG-UNIT TABS Take 1 tablet by mouth daily.     . camphor-menthol (SARNA) lotion Apply 1 application topically as needed for itching.    . famotidine (PEPCID) 20 MG tablet Take 1 tablet (20 mg total) by mouth 2 (two) times daily. 60 tablet 5  . fexofenadine (ALLEGRA) 180 MG tablet Take 180 mg by mouth at bedtime.    Marland Kitchen lisinopril (PRINIVIL,ZESTRIL) 2.5 MG tablet Take 2.5 mg by mouth daily.    . methocarbamol (ROBAXIN) 500 MG tablet Take 500 mg by mouth as needed.     . methylPREDNISolone (MEDROL) 4 MG tablet 5 tab po qd X 3d then 4 tab po qd X 3d then 3 tab po qd X 3d then 2 tab po qd x 3 d  then 1 tab po qd x 3 days then stop 45 tablet 1   . ondansetron (ZOFRAN) 4 MG tablet Take 1 tablet (4 mg total) by mouth every 6 (six) hours. 12 tablet 0  . oxyCODONE-acetaminophen (PERCOCET/ROXICET) 5-325 MG tablet Take 1 tablet by mouth every 6 (six) hours as needed for severe pain. 10 tablet 0  . spironolactone (ALDACTONE) 25 MG tablet Take 25 mg by mouth 2 (two) times daily.  5  . ticagrelor (BRILINTA) 90 MG TABS tablet Take 1 tablet (90 mg total) by mouth 2 (two) times daily. 180 tablet 1  . triamcinolone cream (KENALOG) 0.1 % Apply 1 application topically as needed (rash).      No current facility-administered medications on file prior to visit.     Past Surgical History:  Procedure Laterality Date  . ABDOMINAL HYSTERECTOMY  2006   b/l SPO and TAH, uterine cancer  . EYE SURGERY Bilateral 1960  . TUBAL LIGATION      Allergies  Allergen Reactions  . Amoxicillin Anaphylaxis  . Hydroxyzine Itching and Other (See Comments)    Other reaction(s): Other (See Comments) Sedation Sedation Sedation   . Sulfa Antibiotics Swelling    Other reaction(s): Swelling tongue tongue tongue     Social History   Socioeconomic History  . Marital status: Married    Spouse name: Not  on file  . Number of children: Not on file  . Years of education: Not on file  . Highest education level: Not on file  Occupational History  . Not on file  Social Needs  . Financial resource strain: Not on file  . Food insecurity:    Worry: Not on file    Inability: Not on file  . Transportation needs:    Medical: Not on file    Non-medical: Not on file  Tobacco Use  . Smoking status: Former Smoker    Packs/day: 0.50    Years: 24.00    Pack years: 12.00    Types: Cigarettes    Last attempt to quit: 04/17/1987    Years since quitting: 31.0  . Smokeless tobacco: Never Used  Substance and Sexual Activity  . Alcohol use: Not Currently  . Drug use: Not Currently  . Sexual activity: Yes  Lifestyle  . Physical activity:    Days per week: Not on  file    Minutes per session: Not on file  . Stress: Not on file  Relationships  . Social connections:    Talks on phone: Not on file    Gets together: Not on file    Attends religious service: Not on file    Active member of club or organization: Not on file    Attends meetings of clubs or organizations: Not on file    Relationship status: Not on file  . Intimate partner violence:    Fear of current or ex partner: Not on file    Emotionally abused: Not on file    Physically abused: Not on file    Forced sexual activity: Not on file  Other Topics Concern  . Not on file  Social History Narrative  . Not on file    Family History  Problem Relation Age of Onset  . Cancer Mother        liver cancer, breast cancer x 2   . Alcohol abuse Mother        smoker  . Heart disease Father        MI  . Alcohol abuse Father        smoker  . Depression Sister   . Tremor Sister   . Cancer Brother        pancreatic  . Diabetes Maternal Grandmother   . Hearing loss Maternal Grandmother   . Depression Maternal Grandmother   . Allergic rhinitis Neg Hx   . Angioedema Neg Hx   . Asthma Neg Hx   . Eczema Neg Hx   . Immunodeficiency Neg Hx   . Urticaria Neg Hx     BP (!) 152/99   Pulse (!) 53   Ht 5\' 2"  (1.575 m)   Wt 131 lb (59.4 kg)   BMI 23.96 kg/m   Review of Systems: See HPI above.     Objective:  Physical Exam:  Gen: NAD, comfortable in exam room  Back: No gross deformity, scoliosis. TTP bilateral paraspinal lumbar regions.  No midline or bony TTP. FROM with pain on extension.  Bilateral rotations limited to 20 degrees with pain more right than left. Strength LEs 5/5 all muscle groups.   2+ MSRs in patellar and achilles tendons, equal bilaterally. Negative SLRs. Sensation intact to light touch bilaterally.  Left hip: No deformity. FROM with 5/5 strength. No TTP. NVI distally. Negative logroll Negative fabers and piriformis stretches.  Right hip: No  deformity. FROM with 5/5 strength. Mild tenderness  to palpation proximal IT band.  No other tenderness. NVI distally. Negative logroll Negative fabers and piriformis stretches. Positive ober's.   Assessment & Plan:  1. Low back pain into both legs - current exam consistent with facet arthritis noted on her radiographs (pain worse with extension and lateral rotations), proximal IT band syndrome on the right.  She will start physical therapy, has done well with this in the past.  Tylenol, topical medications including voltaren gel, supplements reviewed.  Heat or ice.  F/u in 5-6 weeks.

## 2018-04-17 NOTE — Patient Instructions (Signed)
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 These are the different medications you can take for this: Tylenol 500mg  1-2 tabs three times a day for pain. Capsaicin, aspercreme, or biofreeze topically up to four times a day may also help with pain. Some supplements that may help for arthritis: Boswellia extract, curcumin, pycnogenol Topical voltaren gel up to 4 times a day if approved as needed for pain and inflammation. It's important that you continue to stay active. Start physical therapy and do home exercises on days you don't go to therapy. Heat or ice 15 minutes at a time 3-4 times a day as needed to help with pain. Follow up with me in 5-6 weeks for reevaluation.

## 2018-04-17 NOTE — Telephone Encounter (Signed)
Requested medication (s) are due for refill today: yes  Requested medication (s) are on the active medication list: yes  Last refill:  Historical provider  Future visit scheduled: yes  Notes to clinic:  Historical provider    Requested Prescriptions  Pending Prescriptions Disp Refills   lisinopril (PRINIVIL,ZESTRIL) 2.5 MG tablet      Sig: Take 1 tablet (2.5 mg total) by mouth daily.     Cardiovascular:  ACE Inhibitors Failed - 04/17/2018 11:58 AM      Failed - Last BP in normal range    BP Readings from Last 1 Encounters:  04/17/18 (!) 152/99         Passed - Cr in normal range and within 180 days    Creatinine, Ser  Date Value Ref Range Status  04/04/2018 0.89 0.40 - 1.20 mg/dL Final         Passed - K in normal range and within 180 days    Potassium  Date Value Ref Range Status  04/04/2018 4.0 3.5 - 5.1 mEq/L Final         Passed - Patient is not pregnant      Passed - Valid encounter within last 6 months    Recent Outpatient Visits          1 week ago Vascular dementia with behavior disturbance (HCC)   Holiday representative at [facility], Bryon Lions, MD   1 month ago Vascular dementia with behavior disturbance [facility])   Holiday representative at [employer], [place], DO   2 months ago Myalgia   Holiday representative at [employer], Misty Stanley A, MD   3 months ago Type 2 diabetes mellitus without complication, without long-term current use of insulin (HCC)   Holiday representative at [employer], Bryon Lions, MD   6 months ago Breast cancer screening   Holiday representative at [facility], Bryon Lions, MD      Future Appointments            In 3 weeks Bradd Canary, MD [facility] at [facility], [place]

## 2018-04-17 NOTE — Telephone Encounter (Signed)
Copied from CRM [id number]. Topic: Quick Communication - Rx Refill/Question >> Apr 17, 2018 11:55 AM Tamela Oddi wrote: Medication: lisinopril (PRINIVIL,ZESTRIL) 2.5 MG tablet  Patient called to request a refill for the above medication  Preferred Pharmacy (with phone number or street name): [id number] - [place], [place] - [address] AT [address]OSS FROM [address] [phone] (Phone) [phone] (Fax)

## 2018-04-25 ENCOUNTER — Telehealth: Payer: Self-pay

## 2018-04-25 NOTE — Telephone Encounter (Signed)
Copied from CRM [id number]. Topic: Appointment Scheduling - Scheduling Inquiry for Clinic >> Apr 24, 2018  9:33 AM Windy Kalata, NT wrote: Reason for CRM: patient is calling and states she was seen in December and she has appointment on 05/08/18. Patient is wanting to know if she needs to keep this appointment or if it can be canceled.    Let patient know if she has no concerns she can cancel her appt.

## 2018-04-28 ENCOUNTER — Ambulatory Visit: Payer: Medicare Other | Admitting: Family Medicine

## 2018-04-29 ENCOUNTER — Ambulatory Visit: Payer: Medicare Other | Attending: Family Medicine | Admitting: Physical Therapy

## 2018-04-29 ENCOUNTER — Other Ambulatory Visit: Payer: Self-pay

## 2018-04-29 ENCOUNTER — Encounter: Payer: Self-pay | Admitting: Physical Therapy

## 2018-04-29 VITALS — BP 143/78 | HR 55

## 2018-04-29 DIAGNOSIS — M545 Low back pain, unspecified: Secondary | ICD-10-CM

## 2018-04-29 DIAGNOSIS — R293 Abnormal posture: Secondary | ICD-10-CM

## 2018-04-29 DIAGNOSIS — G8929 Other chronic pain: Secondary | ICD-10-CM

## 2018-04-29 DIAGNOSIS — M542 Cervicalgia: Secondary | ICD-10-CM

## 2018-04-29 DIAGNOSIS — M6281 Muscle weakness (generalized): Secondary | ICD-10-CM

## 2018-04-29 DIAGNOSIS — R29898 Other symptoms and signs involving the musculoskeletal system: Secondary | ICD-10-CM

## 2018-04-29 NOTE — Therapy (Signed)
[facility] Outpatient [facility] [address]  [address] [place], [place], [address] Phone: [phone]   Fax:  [phone]  Physical Therapy Evaluation  Patient Details  Name: Kristina Lee MRN: [id number] Date of Birth: [birth year] Referring Provider (PT): Norton Blizzard, MD   Encounter Date: 04/29/2018  PT End of Session - 04/29/18 1538    Visit Number  1    Number of Visits  17    Date for PT Re-Evaluation  06/24/18    Authorization Type  UHC Medicare    PT Start Time  1432    PT Stop Time  1530    PT Time Calculation (min)  58 min    Activity Tolerance  Patient tolerated treatment well;Patient limited by pain    Behavior During Therapy  [facility] for tasks assessed/performed       Past Medical History:  Diagnosis Date  . Diabetes mellitus without complication (HCC)   . Heart attack (HCC) 09/07/2017  . Urticaria     Past Surgical History:  Procedure Laterality Date  . ABDOMINAL HYSTERECTOMY  2006   b/l SPO and TAH, uterine cancer  . EYE SURGERY Bilateral 1960  . TUBAL LIGATION      Vitals:   04/29/18 1434  BP: (!) 143/78  Pulse: (!) 55  SpO2: 97%     Subjective Assessment - 04/29/18 1434    Subjective  Patient reports that 2 years ago she woke up with B LBP. Got to the point where she could not get out of bed. Went to ED for pain. The next year, she was playing with and lifting up her grandkids and had excruciating pain in her back. Had MRI- which showed lumbar degeneration throughout lumbar spine. Had PT for this with good benefit throughout 2019. Reports that in May 2019 she had a major MI and underwent cardiac rehab. Notes that some of the machines she used there lead to a flare up of LBP. Recently has been having issues with B LBP wrapping around hips as well as neck pain down her upper back. Back pain worse with flexion and rotation, getting into and out of bed. Intermittent tingling in B buttocks, but no numbness. Neck pain  started 2 years ago, R> L. Worse with "using it" and intermittent radiation down B shoulders. Today noting L middle IP joint- feeling of coldness and pain; worse with lifting and resistance.    Pertinent History  MI 2019, DM, vascular dementia with behavior disturbance, CAD, GERD, HLD, hypokalemia, lumbosacral DDD, CKD III, HTN, hx of uterine CA    Limitations  Lifting;Standing;Walking;House hold activities    Diagnostic tests  03/12/18 lumbar xray: There is no acute bony abnormality of the lumbar spine. Mild chronic changes as described.    Currently in Pain?  Yes    Pain Score  4     Pain Location  Neck    Pain Orientation  Right;Posterior    Pain Descriptors / Indicators  Aching    Pain Type  Chronic pain    Pain Radiating Towards  R UT         [facility] PT Assessment - 04/29/18 1456      Assessment   Medical Diagnosis  R acute LBP, R ITB syndrome    Referring Provider (PT)  Norton Blizzard, MD    Onset Date/Surgical Date  04/29/16    Next MD Visit  --   not scheduled   Prior Therapy  Yes- for LBP  Precautions   Precautions  --   hx of MI in 2019, hx of uterine CA     Restrictions   Weight Bearing Restrictions  No      Balance Screen   Has the patient fallen in the past 6 months  No    Has the patient had a decrease in activity level because of a fear of falling?   No    Is the patient reluctant to leave their home because of a fear of falling?   No      Home Environment   Living Environment  Private residence    Living Arrangements  Spouse/significant other    Available Help at Discharge  Family      Prior Function   Level of Independence  Independent    Vocation  Retired      IT consultantCognition   Overall Cognitive Status  Within Functional Limits for tasks assessed      Sensation   Light Touch  Appears Intact      Coordination   Gross Motor Movements are Fluid and Coordinated  Yes      Posture/Postural Control   Posture/Postural Control  Postural limitations     Postural Limitations  Rounded Shoulders;Forward head;Posterior pelvic tilt      ROM / Strength   AROM / PROM / Strength  Strength;AROM      AROM   AROM Assessment Site  Lumbar;Hip    Right/Left Hip  --   B HIP ER WFL, B hip IR limited and painful with OP   Lumbar Flexion  toes   1/10 pain LB   Lumbar Extension  severely limited   mild pain in LBP   Lumbar - Right Side Bend  distal thigh   severe R LBP   Lumbar - Left Side Bend  distal thigh   severe L LBP   Lumbar - Right Rotation  mildly limited   severe R LBP   Lumbar - Left Rotation  mildly limied   severe L LBP     Strength   Strength Assessment Site  Hip;Knee;Ankle    Right/Left Hip  Right;Left    Right Hip Flexion  4/5    Right Hip ABduction  4+/5    Right Hip ADduction  4/5    Left Hip Flexion  4/5    Left Hip ABduction  4+/5    Left Hip ADduction  4/5    Right/Left Knee  Right;Left    Right Knee Flexion  5/5    Right Knee Extension  5/5    Left Knee Flexion  5/5    Left Knee Extension  5/5   mild hip pain   Right/Left Ankle  Right;Left    Right Ankle Dorsiflexion  4+/5    Right Ankle Plantar Flexion  4/5    Left Ankle Dorsiflexion  4+/5    Left Ankle Plantar Flexion  4/5      Flexibility   Soft Tissue Assessment /Muscle Length  yes    Hamstrings  mod tightness on B LEs    Quadriceps  B mildly tight    Piriformis  mod tightness      Palpation   Palpation comment  considerable tone in B thoracolumbar paraspinals, TTP in superior glutes and R piriformis      Special Tests    Special Tests  Hip Special Tests    Hip Special Tests   Hip Scouring      Hip Scouring   Findings  Positive    Side  Right    Comments  pain in hip      Ambulation/Gait   Gait Pattern  Step-through pattern;Decreased step length - left;Decreased step length - right;Trunk flexed;Lateral trunk lean to right;Lateral trunk lean to left    Gait velocity  decreased                Objective measurements completed on  examination: See above findings.              PT Education - 04/29/18 1538    Education Details  prognosis, POC, HEP    Person(s) Educated  Patient    Methods  Explanation;Demonstration;Tactile cues;Verbal cues;Handout    Comprehension  Verbalized understanding;Returned demonstration       PT Short Term Goals - 04/29/18 1548      PT SHORT TERM GOAL #1   Title  Patient to be independent with initial HEP.    Time  4    Period  Weeks    Status  New    Target Date  05/27/18        PT Long Term Goals - 04/29/18 1548      PT LONG TERM GOAL #1   Title  Patient to be independnet with advanced HEP.    Time  8    Period  Weeks    Status  New    Target Date  06/24/18      PT LONG TERM GOAL #2   Title  Patient to demonstrate [facility] and pain-free lumbar AROM.    Time  8    Period  Weeks    Status  New    Target Date  06/24/18      PT LONG TERM GOAL #3   Title  Patient to demonstrate B LE & UE strength >=4+/5.     Time  8    Period  Weeks    Status  New    Target Date  06/24/18      PT LONG TERM GOAL #4   Title  Patient to report 50% improvement in pain levels with supine<>sit transfers.     Time  8    Period  Weeks    Status  New    Target Date  06/24/18      PT LONG TERM GOAL #5   Title  Patient to report 50% improvement in cervical pain with ADLs.    Time  8    Period  Weeks    Status  New    Target Date  06/24/18             Plan - 04/29/18 1539    Clinical Impression Statement  Patient is a 72y/o F PMH significant for MI in May 2019, presenting to OPPT with c/o chronic B LBP wrapping around B hips and B cervical pain, R>L. LBP worse with flexion, rotation, and getting into and out of bed. Reports intermittent tingling in B buttocks, no numbness. B cervical pain is posterior with intermittent radiation along UT and upper back, worse with "using it." Patient today with limited and painful lumbar AROM, B hip IR, positive scour on R LE, and limited  flexibility in HS, hip flexors, and piriformis. Patient with considerable LBP upon sitting from supine, however declined modalities at end of session. Educated on gentle ROM HEP and advised to discontinue if pain worsens. Would benefit from skilled PT services 2x/week for 8 weeks to address aforementioned impairments.  Clinical Presentation  Stable    Clinical Decision Making  Low    Rehab Potential  Good    Clinical Impairments Affecting Rehab Potential  MI 2019, DM, vascular dementia with behavior disturbance, CAD, GERD, HLD, hypokalemia, lumbosacral DDD, CKD III, HTN, hx of uterine CA    PT Frequency  2x / week    PT Duration  8 weeks    PT Treatment/Interventions  ADLs/Self Care Home Management;Cryotherapy;Electrical Stimulation;Functional mobility training;Stair training;Gait training;DME Instruction;Ultrasound;Traction;Moist Heat;Therapeutic activities;Therapeutic exercise;Balance training;Neuromuscular re-education;Patient/family education;Passive range of motion;Manual techniques;Dry needling;Energy conservation;Splinting;Taping    PT Next Visit Plan  assess cervical AROM and UE strength    Consulted and Agree with Plan of Care  Patient       Patient will benefit from skilled therapeutic intervention in order to improve the following deficits and impairments:  Decreased range of motion, Difficulty walking, Increased muscle spasms, Decreased activity tolerance, Pain, Decreased balance, Impaired flexibility, Improper body mechanics, Postural dysfunction, Decreased strength  Visit Diagnosis: Chronic bilateral low back pain without sciatica  Cervicalgia  Other symptoms and signs involving the musculoskeletal system  Muscle weakness (generalized)  Abnormal posture     Problem List Patient Active Problem List   Diagnosis Date Noted  . Vascular dementia with behavior disturbance (HCC) 02/24/2018  . Myalgia 02/09/2018  . Seasonal allergic rhinitis due to pollen 01/21/2018  .  Cough 12/23/2017  . Allergic urticaria 12/23/2017  . History of myocardial infarction in last year 12/23/2017  . Diabetes mellitus (HCC) 12/23/2017  . Rash and nonspecific skin eruption 12/23/2017  . Intrinsic atopic dermatitis 12/23/2017  . CAD (coronary artery disease) 09/25/2017  . Low back pain 09/25/2017  . Gastroesophageal reflux disease with esophagitis 09/04/2017  . Hyperlipidemia associated with type 2 diabetes mellitus (HCC) 12/31/2016  . Hypokalemia 10/28/2015  . DDD (degenerative disc disease), lumbosacral 09/13/2015  . Diverticulosis of large intestine 09/13/2015  . Seborrheic dermatitis of scalp 09/13/2015  . Chronic kidney disease, stage III (moderate) (HCC) 05/02/2015  . Type 2 diabetes mellitus without complication, without long-term current use of insulin (HCC) 01/18/2015  . Allergic rhinitis 07/14/2014  . Essential hypertension 07/14/2014  . History of uterine cancer 07/14/2014      Anette Guarneri, PT, DPT 04/29/18 3:55 PM    [facility] [address]  [address] [place], [place], [address] Phone: [phone]   Fax:  [phone]  Name: Kristina Lee MRN: [id number] Date of Birth: 11-Jun-1945

## 2018-05-06 ENCOUNTER — Ambulatory Visit: Payer: Medicare Other

## 2018-05-06 VITALS — BP 144/76

## 2018-05-06 DIAGNOSIS — M6281 Muscle weakness (generalized): Secondary | ICD-10-CM

## 2018-05-06 DIAGNOSIS — M545 Low back pain, unspecified: Secondary | ICD-10-CM

## 2018-05-06 DIAGNOSIS — G8929 Other chronic pain: Secondary | ICD-10-CM

## 2018-05-06 DIAGNOSIS — R293 Abnormal posture: Secondary | ICD-10-CM

## 2018-05-06 DIAGNOSIS — R29898 Other symptoms and signs involving the musculoskeletal system: Secondary | ICD-10-CM

## 2018-05-06 DIAGNOSIS — M542 Cervicalgia: Secondary | ICD-10-CM

## 2018-05-06 NOTE — Therapy (Signed)
[facility] Outpatient Rehabilitation [facility] [address]  [address] [place], [place], [address] Phone: [phone]   Fax:  [phone]  Physical Therapy Treatment  Patient Details  Name: Kristina Lee MRN: [id number] Date of Birth: 1945/12/07 Referring Provider (PT): Norton Blizzard, MD   Encounter Date: 05/06/2018  PT End of Session - 05/06/18 1109    Visit Number  2    Number of Visits  17    Date for PT Re-Evaluation  06/24/18    Authorization Type  UHC Medicare    PT Start Time  1102    PT Stop Time  1152    PT Time Calculation (min)  50 min    Activity Tolerance  Patient tolerated treatment well    Behavior During Therapy  [facility] for tasks assessed/performed       Past Medical History:  Diagnosis Date  . Diabetes mellitus without complication (HCC)   . Heart attack (HCC) 09/07/2017  . Urticaria     Past Surgical History:  Procedure Laterality Date  . ABDOMINAL HYSTERECTOMY  2006   b/l SPO and TAH, uterine cancer  . EYE SURGERY Bilateral 1960  . TUBAL LIGATION      Vitals:   05/06/18 1104  BP: (!) 144/76    Subjective Assessment - 05/06/18 1104    Subjective  Pt. reporting some confusion with technique on HEP.      Pertinent History  MI 2019, DM, vascular dementia with behavior disturbance, CAD, GERD, HLD, hypokalemia, lumbosacral DDD, CKD III, HTN, hx of uterine CA    Diagnostic tests  03/12/18 lumbar xray: There is no acute bony abnormality of the lumbar spine. Mild chronic changes as described.    Currently in Pain?  Yes    Pain Score  8     Pain Location  Neck    Pain Orientation  Right    Pain Descriptors / Indicators  Sharp   "intense"    Pain Type  Chronic pain    Pain Frequency  Intermittent    Multiple Pain Sites  Yes    Pain Score  2    Pain Location  Back    Pain Orientation  Lower    Pain Descriptors / Indicators  Dull    Pain Type  Acute pain    Pain Radiating Towards  into buttocks    Aggravating Factors    walking         [facility] PT Assessment - 05/06/18 0001      AROM   AROM Assessment Site  Cervical    Right/Left Hip  Right;Left    Cervical Flexion  37    Cervical Extension  45    Cervical - Right Side Bend  30    Cervical - Left Side Bend  27    Cervical - Right Rotation  60    Cervical - Left Rotation  54      Strength   Strength Assessment Site  Shoulder    Right/Left Shoulder  Right;Left    Right Shoulder Flexion  4/5    Right Shoulder ABduction  4+/5    Right Shoulder Internal Rotation  4/5    Right Shoulder External Rotation  4/5    Left Shoulder Flexion  4/5    Left Shoulder ABduction  4+/5    Left Shoulder Internal Rotation  4/5    Left Shoulder External Rotation  4/5  [facility] Adult PT Treatment/Exercise - 05/06/18 1125      Self-Care   Self-Care  Other Self-Care Comments    Other Self-Care Comments   REview of proper supine>sidelying>sitting movement technique as to reduce lumbar strain with pt. able to demo proper technique following cueing      Lumbar Exercises: Stretches   Single Knee to Chest Stretch  Right;Left;1 rep;30 seconds    Lower Trunk Rotation  --   5" x 10 reps    Piriformis Stretch  Right;Left;1 rep;30 seconds    Piriformis Stretch Limitations  KTOS       Lumbar Exercises: Aerobic   Nustep  Lvl 2, 7 min       Lumbar Exercises: Supine   Pelvic Tilt  10 reps;5 seconds               PT Short Term Goals - 05/06/18 1119      PT SHORT TERM GOAL #1   Title  Patient to be independent with initial HEP.    Time  4    Period  Weeks    Status  On-going        PT Long Term Goals - 05/06/18 1119      PT LONG TERM GOAL #1   Title  Patient to be independnet with advanced HEP.    Time  8    Period  Weeks    Status  On-going      PT LONG TERM GOAL #2   Title  Patient to demonstrate [facility] and pain-free lumbar AROM.    Time  8    Period  Weeks    Status  On-going      PT LONG TERM GOAL #3   Title  Patient  to demonstrate B LE & UE strength >=4+/5.     Time  8    Period  Weeks    Status  On-going      PT LONG TERM GOAL #4   Title  Patient to report 50% improvement in pain levels with supine<>sit transfers.     Time  8    Period  Weeks    Status  On-going      PT LONG TERM GOAL #5   Title  Patient to report 50% improvement in cervical pain with ADLs.    Time  8    Period  Weeks    Status  On-going            Plan - 05/06/18 1124    Clinical Impression Statement  Ilce reporting primary concern today was neck and back pain.  Reviewed initial HEP with pt. requiring cueing for overall technique with most activities.  Addressed B UE MMT today and cervical AROM per plan from evaluation with pt. demonstrating B UE strength deficits in all motions and limited cervical AROM in L rotation, B side bending, and flexion ROM.  Ended visit pain free.  Will continue to progress toward goals.        Rehab Potential  Good    Clinical Impairments Affecting Rehab Potential  MI 2019, DM, vascular dementia with behavior disturbance, CAD, GERD, HLD, hypokalemia, lumbosacral DDD, CKD III, HTN, hx of uterine CA    PT Frequency  2x / week    PT Duration  8 weeks    PT Treatment/Interventions  ADLs/Self Care Home Management;Cryotherapy;Electrical Stimulation;Functional mobility training;Stair training;Gait training;DME Instruction;Ultrasound;Traction;Moist Heat;Therapeutic activities;Therapeutic exercise;Balance training;Neuromuscular re-education;Patient/family education;Passive range of motion;Manual techniques;Dry needling;Energy conservation;Splinting;Taping    PT Next Visit  Plan  Progress lumbar ROM, LE stretching, review technique with supine>sit prn; body mechanics and posture instruction     Consulted and Agree with Plan of Care  Patient       Patient will benefit from skilled therapeutic intervention in order to improve the following deficits and impairments:  Decreased range of motion, Difficulty  walking, Increased muscle spasms, Decreased activity tolerance, Pain, Decreased balance, Impaired flexibility, Improper body mechanics, Postural dysfunction, Decreased strength  Visit Diagnosis: Chronic bilateral low back pain without sciatica  Cervicalgia  Other symptoms and signs involving the musculoskeletal system  Muscle weakness (generalized)  Abnormal posture     Problem List Patient Active Problem List   Diagnosis Date Noted  . Vascular dementia with behavior disturbance (HCC) 02/24/2018  . Myalgia 02/09/2018  . Seasonal allergic rhinitis due to pollen 01/21/2018  . Cough 12/23/2017  . Allergic urticaria 12/23/2017  . History of myocardial infarction in last year 12/23/2017  . Diabetes mellitus (HCC) 12/23/2017  . Rash and nonspecific skin eruption 12/23/2017  . Intrinsic atopic dermatitis 12/23/2017  . CAD (coronary artery disease) 09/25/2017  . Low back pain 09/25/2017  . Gastroesophageal reflux disease with esophagitis 09/04/2017  . Hyperlipidemia associated with type 2 diabetes mellitus (HCC) 12/31/2016  . Hypokalemia 10/28/2015  . DDD (degenerative disc disease), lumbosacral 09/13/2015  . Diverticulosis of large intestine 09/13/2015  . Seborrheic dermatitis of scalp 09/13/2015  . Chronic kidney disease, stage III (moderate) (HCC) 05/02/2015  . Type 2 diabetes mellitus without complication, without long-term current use of insulin (HCC) 01/18/2015  . Allergic rhinitis 07/14/2014  . Essential hypertension 07/14/2014  . History of uterine cancer 07/14/2014    Kermit Balo, PTA 05/06/18 12:05 PM    [facility] Outpatient Rehabilitation [facility] [address]  [address] [place], [place], [address] Phone: [phone]   Fax:  [phone]  Name: Cecili Auriemma MRN: [id number] Date of Birth: 11-Jan-1946

## 2018-05-08 ENCOUNTER — Ambulatory Visit: Payer: Medicare Other | Admitting: Family Medicine

## 2018-05-08 ENCOUNTER — Encounter: Payer: Self-pay | Admitting: Family Medicine

## 2018-05-08 VITALS — BP 118/62 | HR 59 | Temp 98.2°F | Resp 18 | Wt 131.4 lb

## 2018-05-08 DIAGNOSIS — E119 Type 2 diabetes mellitus without complications: Secondary | ICD-10-CM

## 2018-05-08 DIAGNOSIS — J069 Acute upper respiratory infection, unspecified: Secondary | ICD-10-CM

## 2018-05-08 DIAGNOSIS — E785 Hyperlipidemia, unspecified: Secondary | ICD-10-CM

## 2018-05-08 DIAGNOSIS — I83891 Varicose veins of right lower extremities with other complications: Secondary | ICD-10-CM

## 2018-05-08 DIAGNOSIS — R21 Rash and other nonspecific skin eruption: Secondary | ICD-10-CM

## 2018-05-08 DIAGNOSIS — I10 Essential (primary) hypertension: Secondary | ICD-10-CM

## 2018-05-08 DIAGNOSIS — E1169 Type 2 diabetes mellitus with other specified complication: Secondary | ICD-10-CM

## 2018-05-08 HISTORY — DX: Varicose veins of right lower extremity with other complications: I83.891

## 2018-05-08 MED ORDER — METHYLPREDNISOLONE 4 MG PO TABS: ORAL_TABLET | ORAL | 0 refills | Status: DC

## 2018-05-08 MED ORDER — DOXYCYCLINE HYCLATE 100 MG PO TABS: 100.0000 mg | ORAL_TABLET | Freq: Two times a day (BID) | ORAL | 0 refills | Status: DC

## 2018-05-08 NOTE — Assessment & Plan Note (Signed)
Encouraged heart healthy diet, increase exercise, avoid trans fats, consider a krill oil cap daily 

## 2018-05-08 NOTE — Assessment & Plan Note (Signed)
Symptoms x 3 days. Encouraged increased rest and hydration, add probiotics, zinc such as Coldeze or Xicam. Treat fevers as needed, Mucinex bid, Elderberry, vitamin C and given paper copies of Doxycycline, methylprednisolone to use if worsens. Report if no imrpovement

## 2018-05-08 NOTE — Assessment & Plan Note (Signed)
Has returned on right leg very itchy is taking Allegra and Famotidine twice daily. May need to return to dermatology. Consider a medrol dosepak

## 2018-05-08 NOTE — Assessment & Plan Note (Signed)
hgba1c acceptable, minimize simple carbs. Increase exercise as tolerated. Continue current meds 

## 2018-05-08 NOTE — Patient Instructions (Signed)
Encouraged increased rest and hydration, add probiotics, zinc such as Coldeze or Xicam. Treat fevers as needed, vitamin C 500 to 1000 mg, plain Mucinex, Elderberry daily. Upper Respiratory Infection, Adult An upper respiratory infection (URI) affects the nose, throat, and upper air passages. URIs are caused by germs (viruses). The most common type of URI is often called "the common cold." Medicines cannot cure URIs, but you can do things at home to relieve your symptoms. URIs usually get better within 7-10 days. Follow these instructions at home: Activity  Rest as needed.  If you have a fever, stay home from work or school until your fever is gone, or until your doctor says you may return to work or school. ? You should stay home until you cannot spread the infection anymore (you are not contagious). ? Your doctor may have you wear a face mask so you have less risk of spreading the infection. Relieving symptoms  Gargle with a salt-water mixture 3-4 times a day or as needed. To make a salt-water mixture, completely dissolve -1 tsp of salt in 1 cup of warm water.  Use a cool-mist humidifier to add moisture to the air. This can help you breathe more easily. Eating and drinking   Drink enough fluid to keep your pee (urine) pale yellow.  Eat soups and other clear broths. General instructions   Take over-the-counter and prescription medicines only as told by your doctor. These include cold medicines, fever reducers, and cough suppressants.  Do not use any products that contain nicotine or tobacco. These include cigarettes and e-cigarettes. If you need help quitting, ask your doctor.  Avoid being where people are smoking (avoid secondhand smoke).  Make sure you get regular shots and get the flu shot every year.  Keep all follow-up visits as told by your doctor. This is important. How to avoid spreading infection to others   Wash your hands often with soap and water. If you do not have  soap and water, use hand sanitizer.  Avoid touching your mouth, face, eyes, or nose.  Cough or sneeze into a tissue or your sleeve or elbow. Do not cough or sneeze into your hand or into the air. Contact a doctor if:  You are getting worse, not better.  You have any of these: ? A fever. ? Chills. ? Brown or red mucus in your nose. ? Yellow or brown fluid (discharge)coming from your nose. ? Pain in your face, especially when you bend forward. ? Swollen neck glands. ? Pain with swallowing. ? White areas in the back of your throat. Get help right away if:  You have shortness of breath that gets worse.  You have very bad or constant: ? Headache. ? Ear pain. ? Pain in your forehead, behind your eyes, and over your cheekbones (sinus pain). ? Chest pain.  You have long-lasting (chronic) lung disease along with any of these: ? Wheezing. ? Long-lasting cough. ? Coughing up blood. ? A change in your usual mucus.  You have a stiff neck.  You have changes in your: ? Vision. ? Hearing. ? Thinking. ? Mood. Summary  An upper respiratory infection (URI) is caused by a germ called a virus. The most common type of URI is often called "the common cold."  URIs usually get better within 7-10 days.  Take over-the-counter and prescription medicines only as told by your doctor. This information is not intended to replace advice given to you by your health care provider. Make sure you  discuss any questions you have with your health care provider. Document Released: 09/19/2007 Document Revised: 11/23/2016 Document Reviewed: 11/23/2016 Elsevier Interactive Patient Education  2019 [employer].

## 2018-05-08 NOTE — Assessment & Plan Note (Addendum)
Has seen Dr Allyson Sabal in past for cardiac concerns. Will refer back to him for evaluation due symptomatic thrombophlebitis at times

## 2018-05-08 NOTE — Assessment & Plan Note (Signed)
hgba1c acceptable, minimize simple carbs. Increase exercise as tolerated.  

## 2018-05-08 NOTE — Assessment & Plan Note (Signed)
Well controlled, no changes to meds. Encouraged heart healthy diet such as the DASH diet and exercise as tolerated.  °

## 2018-05-09 ENCOUNTER — Ambulatory Visit: Payer: Medicare Other | Admitting: Physical Therapy

## 2018-05-09 ENCOUNTER — Encounter: Payer: Self-pay | Admitting: Physical Therapy

## 2018-05-09 VITALS — BP 141/62 | HR 63

## 2018-05-09 DIAGNOSIS — M542 Cervicalgia: Secondary | ICD-10-CM

## 2018-05-09 DIAGNOSIS — R293 Abnormal posture: Secondary | ICD-10-CM

## 2018-05-09 DIAGNOSIS — R29898 Other symptoms and signs involving the musculoskeletal system: Secondary | ICD-10-CM

## 2018-05-09 DIAGNOSIS — M545 Low back pain, unspecified: Secondary | ICD-10-CM

## 2018-05-09 DIAGNOSIS — M6281 Muscle weakness (generalized): Secondary | ICD-10-CM

## 2018-05-09 DIAGNOSIS — G8929 Other chronic pain: Secondary | ICD-10-CM

## 2018-05-09 NOTE — Therapy (Signed)
[facility] [address]  [address] [place], [place], [address] Phone: [phone]   Fax:  [phone]  Physical Therapy Treatment  Patient Details  Name: Kristina Lee MRN: [id number] Date of Birth: 17-Feb-1946 Referring Provider (PT): Norton Blizzard, MD   Encounter Date: 05/09/2018  PT End of Session - 05/09/18 1102    Visit Number  3    Number of Visits  17    Date for PT Re-Evaluation  06/24/18    Authorization Type  [employer] Medicare    PT Start Time  1016    PT Stop Time  1101    PT Time Calculation (min)  45 min    Activity Tolerance  Patient tolerated treatment well    Behavior During Therapy  [facility] for tasks assessed/performed       Past Medical History:  Diagnosis Date  . Diabetes mellitus without complication (HCC)   . Heart attack (HCC) 09/07/2017  . Urticaria     Past Surgical History:  Procedure Laterality Date  . ABDOMINAL HYSTERECTOMY  2006   b/l SPO and TAH, uterine cancer  . EYE SURGERY Bilateral 1960  . TUBAL LIGATION      Vitals:   05/09/18 1017  BP: (!) 141/62  Pulse: 63  SpO2: 98%    Subjective Assessment - 05/09/18 1020    Subjective  Reports that she went to see her PCP yesterday for an upper respiratory virus. Notes that she took all her meds this AM. Says she feels pain all over today, but is getting better.     Pertinent History  MI 2019, DM, vascular dementia with behavior disturbance, CAD, GERD, HLD, hypokalemia, lumbosacral DDD, CKD III, HTN, hx of uterine CA    Diagnostic tests  03/12/18 lumbar xray: There is no acute bony abnormality of the lumbar spine. Mild chronic changes as described.    Currently in Pain?  Yes    Pain Score  6     Pain Location  Neck    Pain Orientation  Right    Pain Descriptors / Indicators  Sharp    Pain Type  Chronic pain    Pain Score  6    Pain Location  Buttocks    Pain Orientation  Right    Pain Descriptors / Indicators  Dull    Pain Type  Acute  pain                       [facility] Adult PT Treatment/Exercise - 05/09/18 0001      Self-Care   Self-Care  Other Self-Care Comments    Other Self-Care Comments   edu and practice of self-massage using ball on wall to B buttocks      Lumbar Exercises: Stretches   Single Knee to Chest Stretch  Right;Left;1 rep;30 seconds    Single Knee to Chest Stretch Limitations  to tolerance    Lower Trunk Rotation Limitations  20x with cues to avoid pushing into pain    Piriformis Stretch  Right;Left;1 rep;30 seconds    Piriformis Stretch Limitations  KTOS; cues to avoid pushing into pain    Figure 4 Stretch  1 rep;30 seconds;With overpressure    Figure 4 Stretch Limitations  to tolerance      Lumbar Exercises: Aerobic   Nustep  Lvl 2, 6 min UEs/LEs       Lumbar Exercises: Supine   Pelvic Tilt  10 reps    Pelvic  Tilt Limitations  good performance    Bridge  15 reps    Bridge Limitations  cues to avoid pushing to point of pain      Manual Therapy   Manual Therapy  Soft tissue mobilization;Myofascial release    Soft tissue mobilization  STM to B glutes and piriformis- worse on R    Myofascial Release  TPR to B piriformis             PT Education - 05/09/18 1102    Education Details  edu on ball on wall massage for pain relief    Person(s) Educated  Patient    Methods  Explanation;Demonstration;Tactile cues;Verbal cues    Comprehension  Verbalized understanding;Returned demonstration       PT Short Term Goals - 05/06/18 1119      PT SHORT TERM GOAL #1   Title  Patient to be independent with initial HEP.    Time  4    Period  Weeks    Status  On-going        PT Long Term Goals - 05/06/18 1119      PT LONG TERM GOAL #1   Title  Patient to be independnet with advanced HEP.    Time  8    Period  Weeks    Status  On-going      PT LONG TERM GOAL #2   Title  Patient to demonstrate [facility] and pain-free lumbar AROM.    Time  8    Period  Weeks    Status   On-going      PT LONG TERM GOAL #3   Title  Patient to demonstrate B LE & UE strength >=4+/5.     Time  8    Period  Weeks    Status  On-going      PT LONG TERM GOAL #4   Title  Patient to report 50% improvement in pain levels with supine<>sit transfers.     Time  8    Period  Weeks    Status  On-going      PT LONG TERM GOAL #5   Title  Patient to report 50% improvement in cervical pain with ADLs.    Time  8    Period  Weeks    Status  On-going            Plan - 05/09/18 1102    Clinical Impression Statement  Patient arrived to session reporting that she has an upper respiratory virus, saw MD for this yesterday. Systolic BP slightly elevated this AM, but WFL. Focused today's session on LE pain. Tolerated STM and TPR to B glutes and piriformis with more soft tissue restriction on R. Educated patient on self-STM with ball on wall for relief of pain at home. Worked on LE stretching for further relief of pain with patient requiring cues to avoid pushing into pain. Able to tolerate bridges within limited ROM d/t pain. Patient noting good relief of pain at end of session and with no complaints.     Clinical Impairments Affecting Rehab Potential  MI 2019, DM, vascular dementia with behavior disturbance, CAD, GERD, HLD, hypokalemia, lumbosacral DDD, CKD III, HTN, hx of uterine CA    PT Treatment/Interventions  ADLs/Self Care Home Management;Cryotherapy;Electrical Stimulation;Functional mobility training;Stair training;Gait training;DME Instruction;Ultrasound;Traction;Moist Heat;Therapeutic activities;Therapeutic exercise;Balance training;Neuromuscular re-education;Patient/family education;Passive range of motion;Manual techniques;Dry needling;Energy conservation;Splinting;Taping    PT Next Visit Plan  Progress lumbar ROM, LE stretching, review technique with supine>sit prn; body mechanics  and posture instruction     Consulted and Agree with Plan of Care  Patient       Patient will benefit  from skilled therapeutic intervention in order to improve the following deficits and impairments:  Decreased range of motion, Difficulty walking, Increased muscle spasms, Decreased activity tolerance, Pain, Decreased balance, Impaired flexibility, Improper body mechanics, Postural dysfunction, Decreased strength  Visit Diagnosis: Chronic bilateral low back pain without sciatica  Cervicalgia  Other symptoms and signs involving the musculoskeletal system  Muscle weakness (generalized)  Abnormal posture     Problem List Patient Active Problem List   Diagnosis Date Noted  . Varicose veins of right lower extremity with complications 05/08/2018  . URI (upper respiratory infection) 05/08/2018  . Vascular dementia with behavior disturbance (HCC) 02/24/2018  . Myalgia 02/09/2018  . Seasonal allergic rhinitis due to pollen 01/21/2018  . Cough 12/23/2017  . Allergic urticaria 12/23/2017  . History of myocardial infarction in last year 12/23/2017  . Diabetes mellitus (HCC) 12/23/2017  . Rash and nonspecific skin eruption 12/23/2017  . Intrinsic atopic dermatitis 12/23/2017  . CAD (coronary artery disease) 09/25/2017  . Low back pain 09/25/2017  . Gastroesophageal reflux disease with esophagitis 09/04/2017  . Hyperlipidemia associated with type 2 diabetes mellitus (HCC) 12/31/2016  . Hypokalemia 10/28/2015  . DDD (degenerative disc disease), lumbosacral 09/13/2015  . Diverticulosis of large intestine 09/13/2015  . Seborrheic dermatitis of scalp 09/13/2015  . Chronic kidney disease, stage III (moderate) (HCC) 05/02/2015  . Type 2 diabetes mellitus without complication, without long-term current use of insulin (HCC) 01/18/2015  . Allergic rhinitis 07/14/2014  . Essential hypertension 07/14/2014  . History of uterine cancer 07/14/2014    Anette Guarneri, PT, DPT 05/09/18 11:04 AM   [facility] [address]  [address] [place], [place], [address] Phone: [phone]   Fax:  [phone]  Name: Nekeia Sheler MRN: [id number] Date of Birth: [birth year]

## 2018-05-11 NOTE — Progress Notes (Addendum)
Subjective:    Patient ID: Kristina Lee, female    DOB: June 06, 1945, 73 y.o.   MRN: [id number]  No chief complaint on file.   HPI Patient is in today for follow up. She is struggling with respiratory symptoms for several days. Cough, head congestion, malaise. She is also noting worsening trouble with her varicose veins in both lower extremities with some discomfort at times. No recent febrile illness or hospitalizations. Denies CP/palp/SOB/HA/congestion/fevers/GI or GU c/o. Taking meds as prescribed  Past Medical History:  Diagnosis Date  . Diabetes mellitus without complication (HCC)   . Heart attack (HCC) 09/07/2017  . Urticaria     Past Surgical History:  Procedure Laterality Date  . ABDOMINAL HYSTERECTOMY  2006   b/l SPO and TAH, uterine cancer  . EYE SURGERY Bilateral 1960  . TUBAL LIGATION      Family History  Problem Relation Age of Onset  . Cancer Mother        liver cancer, breast cancer x 2   . Alcohol abuse Mother        smoker  . Heart disease Father        MI  . Alcohol abuse Father        smoker  . Depression Sister   . Tremor Sister   . Cancer Brother        pancreatic  . Diabetes Maternal Grandmother   . Hearing loss Maternal Grandmother   . Depression Maternal Grandmother   . Allergic rhinitis Neg Hx   . Angioedema Neg Hx   . Asthma Neg Hx   . Eczema Neg Hx   . Immunodeficiency Neg Hx   . Urticaria Neg Hx     Social History   Socioeconomic History  . Marital status: Married    Spouse name: Not on file  . Number of children: Not on file  . Years of education: Not on file  . Highest education level: Not on file  Occupational History  . Not on file  Social Needs  . Financial resource strain: Not on file  . Food insecurity:    Worry: Not on file    Inability: Not on file  . Transportation needs:    Medical: Not on file    Non-medical: Not on file  Tobacco Use  . Smoking status: Former Smoker    Packs/day: 0.50    Years: 24.00     Pack years: 12.00    Types: Cigarettes    Last attempt to quit: 04/17/1987    Years since quitting: 31.0  . Smokeless tobacco: Never Used  Substance and Sexual Activity  . Alcohol use: Not Currently  . Drug use: Not Currently  . Sexual activity: Yes  Lifestyle  . Physical activity:    Days per week: Not on file    Minutes per session: Not on file  . Stress: Not on file  Relationships  . Social connections:    Talks on phone: Not on file    Gets together: Not on file    Attends religious service: Not on file    Active member of club or organization: Not on file    Attends meetings of clubs or organizations: Not on file    Relationship status: Not on file  . Intimate partner violence:    Fear of current or ex partner: Not on file    Emotionally abused: Not on file    Physically abused: Not on file    Forced sexual activity:  Not on file  Other Topics Concern  . Not on file  Social History Narrative  . Not on file    Outpatient Medications Prior to Visit  Medication Sig Dispense Refill  . Acetaminophen (TYLENOL PO) Take 500 mg by mouth 2 (two) times daily.     Marland Kitchen. aspirin EC 81 MG tablet Take 81 mg by mouth daily.    Marland Kitchen. atorvastatin (LIPITOR) 80 MG tablet Take 1 tablet (80 mg total) by mouth daily. 90 tablet 1  . Calcium Carbonate-Vit D-Min (CALCIUM 600+D PLUS MINERALS) 600-400 MG-UNIT TABS Take 1 tablet by mouth daily.     . camphor-menthol (SARNA) lotion Apply 1 application topically as needed for itching.    . diclofenac sodium (VOLTAREN) 1 % GEL Apply 4 g topically 4 (four) times daily. As needed for pain 5 Tube 1  . famotidine (PEPCID) 20 MG tablet Take 1 tablet (20 mg total) by mouth 2 (two) times daily. 60 tablet 5  . fexofenadine (ALLEGRA) 180 MG tablet Take 180 mg by mouth at bedtime.    Marland Kitchen. lisinopril (PRINIVIL,ZESTRIL) 2.5 MG tablet Take 1 tablet (2.5 mg total) by mouth daily. 90 tablet 1  . spironolactone (ALDACTONE) 25 MG tablet Take 25 mg by mouth 2 (two) times  daily.  5  . ticagrelor (BRILINTA) 90 MG TABS tablet Take 1 tablet (90 mg total) by mouth 2 (two) times daily. 180 tablet 1  . triamcinolone cream (KENALOG) 0.1 % Apply 1 application topically as needed (rash).     . methylPREDNISolone (MEDROL) 4 MG tablet 5 tab po qd X 3d then 4 tab po qd X 3d then 3 tab po qd X 3d then 2 tab po qd x 3 d  then 1 tab po qd x 3 days then stop 45 tablet 1  . ondansetron (ZOFRAN) 4 MG tablet Take 1 tablet (4 mg total) by mouth every 6 (six) hours. 12 tablet 0  . oxyCODONE-acetaminophen (PERCOCET/ROXICET) 5-325 MG tablet Take 1 tablet by mouth every 6 (six) hours as needed for severe pain. 10 tablet 0  . methocarbamol (ROBAXIN) 500 MG tablet Take 500 mg by mouth as needed.      No facility-administered medications prior to visit.     Allergies  Allergen Reactions  . Amoxicillin Anaphylaxis  . Hydroxyzine Itching and Other (See Comments)    Other reaction(s): Other (See Comments) Sedation Sedation Sedation   . Sulfa Antibiotics Swelling    Other reaction(s): Swelling tongue tongue tongue     Review of Systems  Constitutional: Positive for malaise/fatigue. Negative for fever.  HENT: Positive for congestion.   Eyes: Negative for blurred vision.  Respiratory: Positive for sputum production and shortness of breath.   Cardiovascular: Negative for chest pain, palpitations and leg swelling.  Gastrointestinal: Negative for abdominal pain, blood in stool and nausea.  Genitourinary: Negative for dysuria and frequency.  Musculoskeletal: Negative for falls.  Skin: Negative for rash.  Neurological: Negative for dizziness, loss of consciousness and headaches.  Endo/Heme/Allergies: Negative for environmental allergies.  Psychiatric/Behavioral: Negative for depression. The patient is not nervous/anxious.        Objective:    Physical Exam Vitals signs and nursing note reviewed.  Constitutional:      General: She is not in acute distress.    Appearance:  She is well-developed.  HENT:     Head: Normocephalic and atraumatic.     Nose: Nose normal.  Eyes:     General:  Right eye: No discharge.        Left eye: No discharge.  Neck:     Musculoskeletal: Normal range of motion and neck supple.  Cardiovascular:     Rate and Rhythm: Normal rate and regular rhythm.     Heart sounds: No murmur.     Comments: Varicose veins bilateral lower extremities.  Pulmonary:     Effort: Pulmonary effort is normal.     Breath sounds: Normal breath sounds.  Abdominal:     General: Bowel sounds are normal.     Palpations: Abdomen is soft.     Tenderness: There is no abdominal tenderness.  Skin:    General: Skin is warm and dry.  Neurological:     Mental Status: She is alert and oriented to person, place, and time.     BP 118/62 (BP Location: Left Arm, Patient Position: Sitting, Cuff Size: Normal)   Pulse (!) 59   Temp 98.2 F (36.8 C) (Oral)   Resp 18   Wt 131 lb 6.4 oz (59.6 kg)   SpO2 98%   BMI 24.03 kg/m  Wt Readings from Last 3 Encounters:  05/08/18 131 lb 6.4 oz (59.6 kg)  04/17/18 131 lb (59.4 kg)  04/04/18 135 lb 3.2 oz (61.3 kg)     Lab Results  Component Value Date   WBC 5.6 04/04/2018   HGB 14.3 04/04/2018   HCT 42.1 04/04/2018   PLT 212.0 04/04/2018   GLUCOSE 113 (H) 04/04/2018   CHOL 100 04/04/2018   TRIG 72.0 04/04/2018   HDL 39.80 04/04/2018   LDLCALC 46 04/04/2018   ALT 16 04/04/2018   AST 26 04/04/2018   NA 143 04/04/2018   K 4.0 04/04/2018   CL 106 04/04/2018   CREATININE 0.89 04/04/2018   BUN 16 04/04/2018   CO2 30 04/04/2018   TSH 0.83 04/04/2018   HGBA1C 6.2 04/04/2018    Lab Results  Component Value Date   TSH 0.83 04/04/2018   Lab Results  Component Value Date   WBC 5.6 04/04/2018   HGB 14.3 04/04/2018   HCT 42.1 04/04/2018   MCV 86.2 04/04/2018   PLT 212.0 04/04/2018   Lab Results  Component Value Date   NA 143 04/04/2018   K 4.0 04/04/2018   CO2 30 04/04/2018   GLUCOSE 113 (H)  04/04/2018   BUN 16 04/04/2018   CREATININE 0.89 04/04/2018   BILITOT 1.2 04/04/2018   ALKPHOS 68 04/04/2018   AST 26 04/04/2018   ALT 16 04/04/2018   PROT 7.1 04/04/2018   ALBUMIN 4.1 04/04/2018   CALCIUM 10.0 04/04/2018   ANIONGAP 11 03/12/2018   GFR 66.16 04/04/2018   Lab Results  Component Value Date   CHOL 100 04/04/2018   Lab Results  Component Value Date   HDL 39.80 04/04/2018   Lab Results  Component Value Date   LDLCALC 46 04/04/2018   Lab Results  Component Value Date   TRIG 72.0 04/04/2018   Lab Results  Component Value Date   CHOLHDL 3 04/04/2018   Lab Results  Component Value Date   HGBA1C 6.2 04/04/2018       Assessment & Plan:   Problem List Items Addressed This Visit    Essential hypertension    Well controlled, no changes to meds. Encouraged heart healthy diet such as the DASH diet and exercise as tolerated.       Hyperlipidemia associated with type 2 diabetes mellitus (HCC)    Encouraged heart healthy diet,  increase exercise, avoid trans fats, consider a krill oil cap daily      Type 2 diabetes mellitus without complication, without long-term current use of insulin (HCC)    hgba1c acceptable, minimize simple carbs. Increase exercise as tolerated. Continue current meds      Diabetes mellitus (HCC)    hgba1c acceptable, minimize simple carbs. Increase exercise as tolerated.       Rash and nonspecific skin eruption    Has returned on right leg very itchy is taking Allegra and Famotidine twice daily. May need to return to dermatology. Consider a medrol dosepak      Varicose veins of right lower extremity with complications - Primary    Has seen Dr Allyson Sabal in past for cardiac concerns. Will refer back to him for evaluation due symptomatic thrombophlebitis at times      Relevant Orders   Ambulatory referral to Cardiology   URI (upper respiratory infection)    Symptoms x 3 days. Encouraged increased rest and hydration, add probiotics, zinc  such as Coldeze or Xicam. Treat fevers as needed, Mucinex bid, Elderberry, vitamin C and given paper copies of Doxycycline, methylprednisolone to use if worsens. Report if no imrpovement         I have discontinued Bosie Clos A. Sawyer's methylPREDNISolone, oxyCODONE-acetaminophen, and ondansetron. I am also having her start on doxycycline and methylPREDNISolone. Additionally, I am having her maintain her aspirin EC, CALCIUM 600+D PLUS MINERALS, methocarbamol, fexofenadine, camphor-menthol, atorvastatin, ticagrelor, triamcinolone cream, Acetaminophen (TYLENOL PO), famotidine, spironolactone, diclofenac sodium, and lisinopril.  Meds ordered this encounter  Medications  . doxycycline (VIBRA-TABS) 100 MG tablet    Sig: Take 1 tablet (100 mg total) by mouth 2 (two) times daily.    Dispense:  20 tablet    Refill:  0  . methylPREDNISolone (MEDROL) 4 MG tablet    Sig: 5 tab po qd X 1d then 4 tab po qd X 1d then 3 tab po qd X 1d then 2 tab po qd then 1 tab po qd    Dispense:  15 tablet    Refill:  0      Danise Edge, MD

## 2018-05-12 ENCOUNTER — Encounter: Payer: Self-pay | Admitting: Physical Therapy

## 2018-05-12 ENCOUNTER — Ambulatory Visit: Payer: Medicare Other | Admitting: Physical Therapy

## 2018-05-12 VITALS — BP 142/78 | HR 63

## 2018-05-12 DIAGNOSIS — M545 Low back pain, unspecified: Secondary | ICD-10-CM

## 2018-05-12 DIAGNOSIS — R293 Abnormal posture: Secondary | ICD-10-CM

## 2018-05-12 DIAGNOSIS — R29898 Other symptoms and signs involving the musculoskeletal system: Secondary | ICD-10-CM

## 2018-05-12 DIAGNOSIS — M542 Cervicalgia: Secondary | ICD-10-CM

## 2018-05-12 DIAGNOSIS — G8929 Other chronic pain: Secondary | ICD-10-CM

## 2018-05-12 DIAGNOSIS — M6281 Muscle weakness (generalized): Secondary | ICD-10-CM

## 2018-05-12 NOTE — Therapy (Signed)
[facility] [place] [address]  [address] [place], [place], [address] Phone: [phone]   Fax:  [phone]  Physical Therapy Treatment  Patient Details  Name: Kristina DecampJudith Ann Eklund MRN: [id number] Date of Birth: Oct 27, 1945 Referring Provider (PT): Norton BlizzardShane Hudnall, MD   Encounter Date: 05/12/2018  PT End of Session - 05/12/18 1143    Visit Number  4    Number of Visits  17    Date for PT Re-Evaluation  06/24/18    Authorization Type  UHC Medicare    PT Start Time  1058    PT Stop Time  1142    PT Time Calculation (min)  44 min    Activity Tolerance  Patient tolerated treatment well    Behavior During Therapy  [facility] for tasks assessed/performed       Past Medical History:  Diagnosis Date  . Diabetes mellitus without complication (HCC)   . Heart attack (HCC) 09/07/2017  . Urticaria     Past Surgical History:  Procedure Laterality Date  . ABDOMINAL HYSTERECTOMY  2006   b/l SPO and TAH, uterine cancer  . EYE SURGERY Bilateral 1960  . TUBAL LIGATION      Vitals:   05/12/18 1100  BP: (!) 142/78  Pulse: 63  SpO2: 97%    Subjective Assessment - 05/12/18 1103    Subjective  Reports that she had reoccurance of L buttock pain after last week's appointment. Also reporting R hip pain "in the socket."    Pertinent History  MI 2019, DM, vascular dementia with behavior disturbance, CAD, GERD, HLD, hypokalemia, lumbosacral DDD, CKD III, HTN, hx of uterine CA    Diagnostic tests  03/12/18 lumbar xray: There is no acute bony abnormality of the lumbar spine. Mild chronic changes as described.    Currently in Pain?  Yes    Pain Score  2     Pain Location  Neck    Pain Orientation  Right    Pain Descriptors / Indicators  Sharp    Pain Type  Chronic pain    Multiple Pain Sites  Yes    Pain Score  4    Pain Location  Buttocks    Pain Orientation  Left    Pain Descriptors / Indicators  Aching;Dull;Sharp    Pain Type  Acute pain                        [facility] Adult PT Treatment/Exercise - 05/12/18 0001      Exercises   Exercises  Neck      Neck Exercises: Seated   Neck Retraction  10 reps;3 secs    Neck Retraction Limitations  manual & verbal cues for proper motion; patient with difficulty    Other Seated Exercise  scapular retraction 10x5" with manual assistance for scapular adduction      Lumbar Exercises: Stretches   Piriformis Stretch  Right;Left;1 rep;30 seconds    Piriformis Stretch Limitations  KTOS; cues to avoid pushing into pain      Lumbar Exercises: Aerobic   Nustep  Lvl 2, 6 min LEs       Lumbar Exercises: Seated   Other Seated Lumbar Exercises  B sitting clam with green TB x10    Other Seated Lumbar Exercises  B sitting hip IR with ball b/w knees x10   sitting on 1 airex     Lumbar Exercises: Sidelying   Clam  Right;Left;10 reps  Clam Limitations  cues to avoid trunk rotation     Other Sidelying Lumbar Exercises  R & L open book stretch x10 to tolerance   cues for increased trunk rotation     Neck Exercises: Stretches   Upper Trapezius Stretch  Right;2 reps;30 seconds    Upper Trapezius Stretch Limitations  to tolerance    Levator Stretch  Right;2 reps;30 seconds    Levator Stretch Limitations  to tolerance             PT Education - 05/12/18 1143    Education Details  update to HEP    Person(s) Educated  Patient    Methods  Explanation;Demonstration;Tactile cues;Verbal cues;Handout    Comprehension  Verbalized understanding;Returned demonstration       PT Short Term Goals - 05/06/18 1119      PT SHORT TERM GOAL #1   Title  Patient to be independent with initial HEP.    Time  4    Period  Weeks    Status  On-going        PT Long Term Goals - 05/06/18 1119      PT LONG TERM GOAL #1   Title  Patient to be independnet with advanced HEP.    Time  8    Period  Weeks    Status  On-going      PT LONG TERM GOAL #2   Title  Patient to demonstrate [facility]  and pain-free lumbar AROM.    Time  8    Period  Weeks    Status  On-going      PT LONG TERM GOAL #3   Title  Patient to demonstrate B LE & UE strength >=4+/5.     Time  8    Period  Weeks    Status  On-going      PT LONG TERM GOAL #4   Title  Patient to report 50% improvement in pain levels with supine<>sit transfers.     Time  8    Period  Weeks    Status  On-going      PT LONG TERM GOAL #5   Title  Patient to report 50% improvement in cervical pain with ADLs.    Time  8    Period  Weeks    Status  On-going            Plan - 05/12/18 1143    Clinical Impression Statement  Patient arrived to session with report of recurrence of L buttock pain after last week's appointment. Also reporting R hip pain over the weekend. Worked on gentle cervical stretching and postural correction exercises with patient requiring verbal and manual cues for encouragement of scapular retraction and proper form with cervical retraction. Introduced open book stretch which patient tolerated well within limited range. Good tolerance of progressive hip strengthening ther-ex. Patient with no complaints at end of session and reported understanding of updated cervical HEP.    Clinical Impairments Affecting Rehab Potential  MI 2019, DM, vascular dementia with behavior disturbance, CAD, GERD, HLD, hypokalemia, lumbosacral DDD, CKD III, HTN, hx of uterine CA    PT Treatment/Interventions  ADLs/Self Care Home Management;Cryotherapy;Electrical Stimulation;Functional mobility training;Stair training;Gait training;DME Instruction;Ultrasound;Traction;Moist Heat;Therapeutic activities;Therapeutic exercise;Balance training;Neuromuscular re-education;Patient/family education;Passive range of motion;Manual techniques;Dry needling;Energy conservation;Splinting;Taping    PT Next Visit Plan  progress hip strengthening, lumbar ROM, cervical retractions    Consulted and Agree with Plan of Care  Patient       Patient will  benefit from skilled therapeutic intervention in order to improve the following deficits and impairments:  Decreased range of motion, Difficulty walking, Increased muscle spasms, Decreased activity tolerance, Pain, Decreased balance, Impaired flexibility, Improper body mechanics, Postural dysfunction, Decreased strength  Visit Diagnosis: Chronic bilateral low back pain without sciatica  Cervicalgia  Other symptoms and signs involving the musculoskeletal system  Muscle weakness (generalized)  Abnormal posture     Problem List Patient Active Problem List   Diagnosis Date Noted  . Varicose veins of right lower extremity with complications 05/08/2018  . URI (upper respiratory infection) 05/08/2018  . Vascular dementia with behavior disturbance (HCC) 02/24/2018  . Myalgia 02/09/2018  . Seasonal allergic rhinitis due to pollen 01/21/2018  . Cough 12/23/2017  . Allergic urticaria 12/23/2017  . History of myocardial infarction in last year 12/23/2017  . Diabetes mellitus (HCC) 12/23/2017  . Rash and nonspecific skin eruption 12/23/2017  . Intrinsic atopic dermatitis 12/23/2017  . CAD (coronary artery disease) 09/25/2017  . Low back pain 09/25/2017  . Gastroesophageal reflux disease with esophagitis 09/04/2017  . Hyperlipidemia associated with type 2 diabetes mellitus (HCC) 12/31/2016  . Hypokalemia 10/28/2015  . DDD (degenerative disc disease), lumbosacral 09/13/2015  . Diverticulosis of large intestine 09/13/2015  . Seborrheic dermatitis of scalp 09/13/2015  . Chronic kidney disease, stage III (moderate) (HCC) 05/02/2015  . Type 2 diabetes mellitus without complication, without long-term current use of insulin (HCC) 01/18/2015  . Allergic rhinitis 07/14/2014  . Essential hypertension 07/14/2014  . History of uterine cancer 07/14/2014    Anette Guarneri, PT, DPT 05/12/18 11:46 AM   [facility] [address]  [address] [place], [place], [address] Phone: [phone]   Fax:  [phone]  Name: Moneke Lincicome MRN: [id number] Date of Birth: July 03, 1945

## 2018-05-15 ENCOUNTER — Ambulatory Visit: Payer: Medicare Other

## 2018-05-15 VITALS — BP 142/84 | HR 63

## 2018-05-15 DIAGNOSIS — M545 Low back pain, unspecified: Secondary | ICD-10-CM

## 2018-05-15 DIAGNOSIS — M6281 Muscle weakness (generalized): Secondary | ICD-10-CM

## 2018-05-15 DIAGNOSIS — G8929 Other chronic pain: Secondary | ICD-10-CM

## 2018-05-15 DIAGNOSIS — R29898 Other symptoms and signs involving the musculoskeletal system: Secondary | ICD-10-CM

## 2018-05-15 DIAGNOSIS — M542 Cervicalgia: Secondary | ICD-10-CM

## 2018-05-15 DIAGNOSIS — R293 Abnormal posture: Secondary | ICD-10-CM

## 2018-05-15 NOTE — Therapy (Signed)
[facility] [address]  [address] [place], [place], [address] Phone: [phone]   Fax:  [phone]  Physical Therapy Treatment  Patient Details  Name: Kristina Lee MRN: [id number] Date of Birth: 1945/04/19 Referring Provider (PT): Norton Blizzard, MD   Encounter Date: 05/15/2018  PT End of Session - 05/15/18 1325    Visit Number  5    Number of Visits  17    Date for PT Re-Evaluation  06/24/18    Authorization Type  UHC Medicare    PT Start Time  1314    PT Stop Time  1355    PT Time Calculation (min)  41 min    Activity Tolerance  Patient tolerated treatment well    Behavior During Therapy  [facility] for tasks assessed/performed       Past Medical History:  Diagnosis Date  . Diabetes mellitus without complication (HCC)   . Heart attack (HCC) 09/07/2017  . Urticaria     Past Surgical History:  Procedure Laterality Date  . ABDOMINAL HYSTERECTOMY  2006   b/l SPO and TAH, uterine cancer  . EYE SURGERY Bilateral 1960  . TUBAL LIGATION      Vitals:   05/15/18 1322  BP: (!) 142/84  Pulse: 63  SpO2: 92%    Subjective Assessment - 05/15/18 1322    Subjective  Pt. reporting she wakes in night with back pain occasionally.      Pertinent History  MI 2019, DM, vascular dementia with behavior disturbance, CAD, GERD, HLD, hypokalemia, lumbosacral DDD, CKD III, HTN, hx of uterine CA    Diagnostic tests  03/12/18 lumbar xray: There is no acute bony abnormality of the lumbar spine. Mild chronic changes as described.    Currently in Pain?  Yes    Pain Score  2     Pain Location  Neck    Pain Orientation  Right    Pain Descriptors / Indicators  Sharp    Pain Type  Chronic pain    Pain Radiating Towards  R UT     Pain Frequency  Intermittent    Multiple Pain Sites  Yes    Pain Score  3    Pain Location  Leg    Pain Orientation  Left;Anterior    Pain Descriptors / Indicators  Aching    Pain Type  Acute pain                        [facility] Adult PT Treatment/Exercise - 05/15/18 1331      Lumbar Exercises: Stretches   Lower Trunk Rotation  --   5" x 10 reps    Lower Trunk Rotation Limitations  ROM R >L     Piriformis Stretch  Right;Left;1 rep;30 seconds    Piriformis Stretch Limitations  KTOS       Lumbar Exercises: Aerobic   Nustep  Lvl 3, 6 min LEs       Lumbar Exercises: Standing   Heel Raises  15 reps;3 seconds    Heel Raises Limitations  counter     Functional Squats  10 reps;3 seconds    Functional Squats Limitations  counter     Other Standing Lumbar Exercises  Alternating hip abduction (x 5 each), extension (10 reps), flexion (10 reps) with yellow looped band at ankles x 10 reps       Neck Exercises: Stretches   Upper Trapezius Stretch  Right;30 seconds;1 rep  Upper Trapezius Stretch Limitations  to tolerance   therapist guided as pt. unsure of technique   Levator Stretch  Right;30 seconds;1 rep    Levator Stretch Limitations  to tolerance   therapist guided as pt. unsure of technique              PT Short Term Goals - 05/06/18 1119      PT SHORT TERM GOAL #1   Title  Patient to be independent with initial HEP.    Time  4    Period  Weeks    Status  On-going        PT Long Term Goals - 05/06/18 1119      PT LONG TERM GOAL #1   Title  Patient to be independnet with advanced HEP.    Time  8    Period  Weeks    Status  On-going      PT LONG TERM GOAL #2   Title  Patient to demonstrate [facility] and pain-free lumbar AROM.    Time  8    Period  Weeks    Status  On-going      PT LONG TERM GOAL #3   Title  Patient to demonstrate B LE & UE strength >=4+/5.     Time  8    Period  Weeks    Status  On-going      PT LONG TERM GOAL #4   Title  Patient to report 50% improvement in pain levels with supine<>sit transfers.     Time  8    Period  Weeks    Status  On-going      PT LONG TERM GOAL #5   Title  Patient to report 50% improvement in  cervical pain with ADLs.    Time  8    Period  Weeks    Status  On-going            Plan - 05/15/18 1325    Clinical Impression Statement  Bosie ClosJudith doing well today.  Tolerated all gentle cervical and lumbar ROM and strengthening activities well today without increased pain.  Did require increased time today for explanation/demo of proper technique with therex due to pt. difficulty following verbal instructions.  Will continue to progress toward goals.      Clinical Impairments Affecting Rehab Potential  MI 2019, DM, vascular dementia with behavior disturbance, CAD, GERD, HLD, hypokalemia, lumbosacral DDD, CKD III, HTN, hx of uterine CA    PT Frequency  2x / week    PT Duration  8 weeks    PT Treatment/Interventions  ADLs/Self Care Home Management;Cryotherapy;Electrical Stimulation;Functional mobility training;Stair training;Gait training;DME Instruction;Ultrasound;Traction;Moist Heat;Therapeutic activities;Therapeutic exercise;Balance training;Neuromuscular re-education;Patient/family education;Passive range of motion;Manual techniques;Dry needling;Energy conservation;Splinting;Taping    PT Next Visit Plan  progress hip strengthening, lumbar ROM, cervical retractions    Consulted and Agree with Plan of Care  Patient       Patient will benefit from skilled therapeutic intervention in order to improve the following deficits and impairments:  Decreased range of motion, Difficulty walking, Increased muscle spasms, Decreased activity tolerance, Pain, Decreased balance, Impaired flexibility, Improper body mechanics, Postural dysfunction, Decreased strength  Visit Diagnosis: Chronic bilateral low back pain without sciatica  Cervicalgia  Other symptoms and signs involving the musculoskeletal system  Muscle weakness (generalized)  Abnormal posture     Problem List Patient Active Problem List   Diagnosis Date Noted  . Varicose veins of right lower extremity with complications  05/08/2018  .  URI (upper respiratory infection) 05/08/2018  . Vascular dementia with behavior disturbance (HCC) 02/24/2018  . Myalgia 02/09/2018  . Seasonal allergic rhinitis due to pollen 01/21/2018  . Cough 12/23/2017  . Allergic urticaria 12/23/2017  . History of myocardial infarction in last year 12/23/2017  . Diabetes mellitus (HCC) 12/23/2017  . Rash and nonspecific skin eruption 12/23/2017  . Intrinsic atopic dermatitis 12/23/2017  . CAD (coronary artery disease) 09/25/2017  . Low back pain 09/25/2017  . Gastroesophageal reflux disease with esophagitis 09/04/2017  . Hyperlipidemia associated with type 2 diabetes mellitus (HCC) 12/31/2016  . Hypokalemia 10/28/2015  . DDD (degenerative disc disease), lumbosacral 09/13/2015  . Diverticulosis of large intestine 09/13/2015  . Seborrheic dermatitis of scalp 09/13/2015  . Chronic kidney disease, stage III (moderate) (HCC) 05/02/2015  . Type 2 diabetes mellitus without complication, without long-term current use of insulin (HCC) 01/18/2015  . Allergic rhinitis 07/14/2014  . Essential hypertension 07/14/2014  . History of uterine cancer 07/14/2014    Kermit BaloMicah Aarin Bluett, PTA 05/15/18 3:13 PM   [facility] [place] [address]  [address] [place], [place], [address] Phone: [phone]   Fax:  [phone]  Name: Halford DecampJudith Ann Clapper MRN: [id number] Date of Birth: 1945-05-17

## 2018-05-19 ENCOUNTER — Ambulatory Visit: Payer: Medicare Other | Attending: Family Medicine | Admitting: Physical Therapy

## 2018-05-19 ENCOUNTER — Encounter: Payer: Self-pay | Admitting: Physical Therapy

## 2018-05-19 VITALS — BP 142/80 | HR 62

## 2018-05-19 DIAGNOSIS — R29898 Other symptoms and signs involving the musculoskeletal system: Secondary | ICD-10-CM | POA: Diagnosis present

## 2018-05-19 DIAGNOSIS — G8929 Other chronic pain: Secondary | ICD-10-CM | POA: Insufficient documentation

## 2018-05-19 DIAGNOSIS — M6281 Muscle weakness (generalized): Secondary | ICD-10-CM | POA: Insufficient documentation

## 2018-05-19 DIAGNOSIS — M542 Cervicalgia: Secondary | ICD-10-CM

## 2018-05-19 DIAGNOSIS — M545 Low back pain: Secondary | ICD-10-CM | POA: Diagnosis present

## 2018-05-19 DIAGNOSIS — R293 Abnormal posture: Secondary | ICD-10-CM | POA: Insufficient documentation

## 2018-05-19 NOTE — Therapy (Signed)
[facility] [place] [address]  [address] [place], [place], [address] Phone: [phone]   Fax:  [phone]  Physical Therapy Treatment  Patient Details  Name: Halford DecampJudith Ann Arcilla MRN: [id number] Date of Birth: July 07, 1945 Referring Provider (PT): Norton BlizzardShane Hudnall, MD   Encounter Date: 05/19/2018  PT End of Session - 05/19/18 1144    Visit Number  6    Number of Visits  17    Date for PT Re-Evaluation  06/24/18    Authorization Type  UHC Medicare    PT Start Time  1050    PT Stop Time  1143    PT Time Calculation (min)  53 min    Activity Tolerance  Patient tolerated treatment well    Behavior During Therapy  [facility] for tasks assessed/performed       Past Medical History:  Diagnosis Date  . Diabetes mellitus without complication (HCC)   . Heart attack (HCC) 09/07/2017  . Urticaria     Past Surgical History:  Procedure Laterality Date  . ABDOMINAL HYSTERECTOMY  2006   b/l SPO and TAH, uterine cancer  . EYE SURGERY Bilateral 1960  . TUBAL LIGATION      Vitals:   05/19/18 1052  BP: (!) 142/80  Pulse: 62  SpO2: 99%    Subjective Assessment - 05/19/18 1055    Subjective  Reports that she is tired from a long weekend with her family. Reports LB is sore, also L middle finger bothering her. Was able to walk to 2 miles over the weekend and notices that she is improving.     Pertinent History  MI 2019, DM, vascular dementia with behavior disturbance, CAD, GERD, HLD, hypokalemia, lumbosacral DDD, CKD III, HTN, hx of uterine CA    Diagnostic tests  03/12/18 lumbar xray: There is no acute bony abnormality of the lumbar spine. Mild chronic changes as described.    Currently in Pain?  Yes    Pain Score  2     Pain Location  Neck    Pain Orientation  Right    Pain Descriptors / Indicators  Sharp    Pain Type  Chronic pain    Pain Score  2    Pain Location  Back    Pain Orientation  Left;Right;Lower    Pain Descriptors / Indicators   Aching    Pain Type  Chronic pain                       [facility] Adult PT Treatment/Exercise - 05/19/18 0001      Exercises   Exercises  Lumbar      Neck Exercises: Seated   Neck Retraction  10 reps;3 secs   pt reporting tenderness   Neck Retraction Limitations  verbal cues for proper form      Lumbar Exercises: Stretches   Single Knee to Chest Stretch  Right;Left;1 rep;30 seconds    Single Knee to Chest Stretch Limitations  to tolerance    Piriformis Stretch  Right;Left;30 seconds;2 reps    Piriformis Stretch Limitations  KTOS       Lumbar Exercises: Aerobic   Nustep  Lvl 3, 6 min LEs/UEs       Lumbar Exercises: Standing   Functional Squats  10 reps;3 seconds    Functional Squats Limitations  at counter; cues for glue and core activation    Row  Strengthening;Both;10 reps;Theraband    Theraband Level (Row)  Level 2 (  Red)    Row Limitations  manual cues for scap retraction and core contraction    Shoulder Extension  Strengthening;Both;10 reps;Theraband    Theraband Level (Shoulder Extension)  Level 2 (Red)    Shoulder Extension Limitations  cues for form    Other Standing Lumbar Exercises  sidestepping at counter top with yellow TB around toes 4x length of counter   cues for upright posture     Lumbar Exercises: Sidelying   Hip Abduction  Right;Left;10 reps    Hip Abduction Limitations  manual assistance for form    Other Sidelying Lumbar Exercises  R&L hip adduction x10 each side   patient reporting difficulty and "heaviness"     Shoulder Exercises: Seated   Retraction  Strengthening;Both;10 reps    Retraction Limitations  10x3" cues for proper form      Neck Exercises: Stretches   Upper Trapezius Stretch  Right;2 reps;30 seconds    Upper Trapezius Stretch Limitations  to tolerance    Levator Stretch  Right;Left;30 seconds;2 reps    Levator Stretch Limitations  to tolerance               PT Short Term Goals - 05/06/18 1119      PT SHORT  TERM GOAL #1   Title  Patient to be independent with initial HEP.    Time  4    Period  Weeks    Status  On-going        PT Long Term Goals - 05/06/18 1119      PT LONG TERM GOAL #1   Title  Patient to be independnet with advanced HEP.    Time  8    Period  Weeks    Status  On-going      PT LONG TERM GOAL #2   Title  Patient to demonstrate [facility] and pain-free lumbar AROM.    Time  8    Period  Weeks    Status  On-going      PT LONG TERM GOAL #3   Title  Patient to demonstrate B LE & UE strength >=4+/5.     Time  8    Period  Weeks    Status  On-going      PT LONG TERM GOAL #4   Title  Patient to report 50% improvement in pain levels with supine<>sit transfers.     Time  8    Period  Weeks    Status  On-going      PT LONG TERM GOAL #5   Title  Patient to report 50% improvement in cervical pain with ADLs.    Time  8    Period  Weeks    Status  On-going            Plan - 05/19/18 1144    Clinical Impression Statement  Patient arrived to session with report that she had a busy weekend in [place] with her family. Was able to walk 2 miles and today having her normal pain levels, no worse than usual. D/t patient's hx of MI, and consistent elevation in systolic BP, advised patient to get in contact with her MD about these changes in vitals. Reviewed cervical stretches with patient with cues to orient to correct side. Patient demonstrated good form with squats- did require cues for glute and core contraction. Introduced sidestepping with light banded resistance with patient reporting no complaints. Tolerated introduction of row and shoulder extension with heavy instruction on scapular retraction and  core contraction. Patient did report quick shooting of pain in R LB, but pain dissipated and she was able to continue. Considerable difficulty demonstrated with sidelying hip strengthening, indicating muscle weakness. Plan to add these exercises to HEP at late date as patient with poor  carryover. Patient reporting baseline pain at end of session.     Clinical Impairments Affecting Rehab Potential  MI 2019, DM, vascular dementia with behavior disturbance, CAD, GERD, HLD, hypokalemia, lumbosacral DDD, CKD III, HTN, hx of uterine CA    PT Treatment/Interventions  ADLs/Self Care Home Management;Cryotherapy;Electrical Stimulation;Functional mobility training;Stair training;Gait training;DME Instruction;Ultrasound;Traction;Moist Heat;Therapeutic activities;Therapeutic exercise;Balance training;Neuromuscular re-education;Patient/family education;Passive range of motion;Manual techniques;Dry needling;Energy conservation;Splinting;Taping    PT Next Visit Plan  MD F/U note next session    Consulted and Agree with Plan of Care  Patient       Patient will benefit from skilled therapeutic intervention in order to improve the following deficits and impairments:  Decreased range of motion, Difficulty walking, Increased muscle spasms, Decreased activity tolerance, Pain, Decreased balance, Impaired flexibility, Improper body mechanics, Postural dysfunction, Decreased strength  Visit Diagnosis: Chronic bilateral low back pain without sciatica  Cervicalgia  Other symptoms and signs involving the musculoskeletal system  Muscle weakness (generalized)  Abnormal posture     Problem List Patient Active Problem List   Diagnosis Date Noted  . Varicose veins of right lower extremity with complications 05/08/2018  . URI (upper respiratory infection) 05/08/2018  . Vascular dementia with behavior disturbance (HCC) 02/24/2018  . Myalgia 02/09/2018  . Seasonal allergic rhinitis due to pollen 01/21/2018  . Cough 12/23/2017  . Allergic urticaria 12/23/2017  . History of myocardial infarction in last year 12/23/2017  . Diabetes mellitus (HCC) 12/23/2017  . Rash and nonspecific skin eruption 12/23/2017  . Intrinsic atopic dermatitis 12/23/2017  . CAD (coronary artery disease) 09/25/2017  . Low  back pain 09/25/2017  . Gastroesophageal reflux disease with esophagitis 09/04/2017  . Hyperlipidemia associated with type 2 diabetes mellitus (HCC) 12/31/2016  . Hypokalemia 10/28/2015  . DDD (degenerative disc disease), lumbosacral 09/13/2015  . Diverticulosis of large intestine 09/13/2015  . Seborrheic dermatitis of scalp 09/13/2015  . Chronic kidney disease, stage III (moderate) (HCC) 05/02/2015  . Type 2 diabetes mellitus without complication, without long-term current use of insulin (HCC) 01/18/2015  . Allergic rhinitis 07/14/2014  . Essential hypertension 07/14/2014  . History of uterine cancer 07/14/2014     Anette Guarneri, PT, DPT 05/19/18 11:46 AM   [facility] [address]  [address] [place], [place], [address] Phone: [phone]   Fax:  [phone]  Name: Grettell Lello MRN: [id number] Date of Birth: 06/26/1945

## 2018-05-22 ENCOUNTER — Ambulatory Visit: Payer: Medicare Other

## 2018-05-22 VITALS — BP 138/82 | HR 56

## 2018-05-22 DIAGNOSIS — G8929 Other chronic pain: Secondary | ICD-10-CM

## 2018-05-22 DIAGNOSIS — R293 Abnormal posture: Secondary | ICD-10-CM

## 2018-05-22 DIAGNOSIS — R29898 Other symptoms and signs involving the musculoskeletal system: Secondary | ICD-10-CM

## 2018-05-22 DIAGNOSIS — M6281 Muscle weakness (generalized): Secondary | ICD-10-CM

## 2018-05-22 DIAGNOSIS — M545 Low back pain: Principal | ICD-10-CM

## 2018-05-22 DIAGNOSIS — M542 Cervicalgia: Secondary | ICD-10-CM

## 2018-05-22 NOTE — Therapy (Signed)
[facility] [address]  [address] [place], [place], [address] Phone: [phone]   Fax:  [phone]  Physical Therapy Treatment  Patient Details  Name: Kristina DecampJudith Ann Duran MRN: [id number] Date of Birth: Aug 21, 1945 Referring Provider (PT): Norton BlizzardShane Hudnall, MD   Encounter Date: 05/22/2018  PT End of Session - 05/22/18 1503    Visit Number  7    Number of Visits  17    Date for PT Re-Evaluation  06/24/18    Authorization Type  UHC Medicare    PT Start Time  1442    PT Stop Time  1540   ended visit with 10 min moist heat    PT Time Calculation (min)  58 min    Activity Tolerance  Patient tolerated treatment well    Behavior During Therapy  [facility] for tasks assessed/performed       Past Medical History:  Diagnosis Date  . Diabetes mellitus without complication (HCC)   . Heart attack (HCC) 09/07/2017  . Urticaria     Past Surgical History:  Procedure Laterality Date  . ABDOMINAL HYSTERECTOMY  2006   b/l SPO and TAH, uterine cancer  . EYE SURGERY Bilateral 1960  . TUBAL LIGATION      Vitals:   05/22/18 1449  BP: 138/82  Pulse: (!) 56  SpO2: 97%    Subjective Assessment - 05/22/18 1449    Subjective  Pt. doing well today.      Pertinent History  MI 2019, DM, vascular dementia with behavior disturbance, CAD, GERD, HLD, hypokalemia, lumbosacral DDD, CKD III, HTN, hx of uterine CA    Diagnostic tests  03/12/18 lumbar xray: There is no acute bony abnormality of the lumbar spine. Mild chronic changes as described.    Currently in Pain?  Yes    Pain Score  4     Pain Location  Neck    Pain Orientation  Right    Pain Descriptors / Indicators  Sharp    Pain Type  Chronic pain    Pain Frequency  Intermittent    Aggravating Factors   turning head     Multiple Pain Sites  No    Pain Score  0                       [facility] Adult PT Treatment/Exercise - 05/22/18 1506      Lumbar Exercises: Stretches    Piriformis Stretch  Right;Left;30 seconds;2 reps    Piriformis Stretch Limitations  KTOS       Lumbar Exercises: Standing   Functional Squats  --   x 12 reps    Functional Squats Limitations  TM support     Row  15 reps;Both;Theraband    Theraband Level (Row)  Level 2 (Red)    Row Limitations  Cues required for scapular retraction     Shoulder Extension  10 reps;Both;Strengthening;Theraband    Theraband Level (Shoulder Extension)  Level 2 (Red)      Lumbar Exercises: Seated   Other Seated Lumbar Exercises  R fitter leg press (1 black) 2 x 10 reps       Lumbar Exercises: Supine   Clam  10 reps;3 seconds    Clam Limitations  red band + abdom. bracing     Bent Knee Raise  10 reps;3 seconds    Bent Knee Raise Limitations  red band + abdom. bracing     Other Supine Lumbar Exercises  Hooklying abdom. bracing + adduction ball squeeze 5" x 10 reps       Manual Therapy   Manual Therapy  Soft tissue mobilization    Manual therapy comments  seated    Soft tissue mobilization  STM to B lumbar and thoracic paraspinals - increased tone noted              PT Education - 05/22/18 1653    Education Details  HEP update; red TB issue to pt. for row    Person(s) Educated  Patient    Methods  Explanation;Demonstration;Verbal cues;Handout    Comprehension  Verbalized understanding;Returned demonstration;Verbal cues required;Need further instruction       PT Short Term Goals - 05/22/18 1504      PT SHORT TERM GOAL #1   Title  Patient to be independent with initial HEP.    Time  4    Period  Weeks    Status  Achieved        PT Long Term Goals - 05/06/18 1119      PT LONG TERM GOAL #1   Title  Patient to be independnet with advanced HEP.    Time  8    Period  Weeks    Status  On-going      PT LONG TERM GOAL #2   Title  Patient to demonstrate [facility] and pain-free lumbar AROM.    Time  8    Period  Weeks    Status  On-going      PT LONG TERM GOAL #3   Title  Patient to  demonstrate B LE & UE strength >=4+/5.     Time  8    Period  Weeks    Status  On-going      PT LONG TERM GOAL #4   Title  Patient to report 50% improvement in pain levels with supine<>sit transfers.     Time  8    Period  Weeks    Status  On-going      PT LONG TERM GOAL #5   Title  Patient to report 50% improvement in cervical pain with ADLs.    Time  8    Period  Weeks    Status  On-going            Plan - 05/22/18 1534    Clinical Impression Statement  Dashonda seen to start session with complaint of R lateral hip pain with pressure to GT area and low/mid LBP.  Addressed tenderness in lumbar paraspinals with MT with some relief noted and progressed lumbopelvic strengthening activities which were tolerated well.  Mild progression in scapular strengthening with elastic band resistance today.  Ended visit with moist heat to lumbar and thoracic spine to reduce pain and tone.  Pt. leaving session reporting decreased pain from baseline.      Clinical Impairments Affecting Rehab Potential  MI 2019, DM, vascular dementia with behavior disturbance, CAD, GERD, HLD, hypokalemia, lumbosacral DDD, CKD III, HTN, hx of uterine CA    PT Treatment/Interventions  ADLs/Self Care Home Management;Cryotherapy;Electrical Stimulation;Functional mobility training;Stair training;Gait training;DME Instruction;Ultrasound;Traction;Moist Heat;Therapeutic activities;Therapeutic exercise;Balance training;Neuromuscular re-education;Patient/family education;Passive range of motion;Manual techniques;Dry needling;Energy conservation;Splinting;Taping    PT Next Visit Plan  Monitor tolerance to updated HEP    Consulted and Agree with Plan of Care  Patient       Patient will benefit from skilled therapeutic intervention in order to improve the following deficits and impairments:  Decreased range of motion, Difficulty walking, Increased  muscle spasms, Decreased activity tolerance, Pain, Decreased balance, Impaired  flexibility, Improper body mechanics, Postural dysfunction, Decreased strength  Visit Diagnosis: Chronic bilateral low back pain without sciatica  Cervicalgia  Other symptoms and signs involving the musculoskeletal system  Muscle weakness (generalized)  Abnormal posture     Problem List Patient Active Problem List   Diagnosis Date Noted  . Varicose veins of right lower extremity with complications 05/08/2018  . URI (upper respiratory infection) 05/08/2018  . Vascular dementia with behavior disturbance (HCC) 02/24/2018  . Myalgia 02/09/2018  . Seasonal allergic rhinitis due to pollen 01/21/2018  . Cough 12/23/2017  . Allergic urticaria 12/23/2017  . History of myocardial infarction in last year 12/23/2017  . Diabetes mellitus (HCC) 12/23/2017  . Rash and nonspecific skin eruption 12/23/2017  . Intrinsic atopic dermatitis 12/23/2017  . CAD (coronary artery disease) 09/25/2017  . Low back pain 09/25/2017  . Gastroesophageal reflux disease with esophagitis 09/04/2017  . Hyperlipidemia associated with type 2 diabetes mellitus (HCC) 12/31/2016  . Hypokalemia 10/28/2015  . DDD (degenerative disc disease), lumbosacral 09/13/2015  . Diverticulosis of large intestine 09/13/2015  . Seborrheic dermatitis of scalp 09/13/2015  . Chronic kidney disease, stage III (moderate) (HCC) 05/02/2015  . Type 2 diabetes mellitus without complication, without long-term current use of insulin (HCC) 01/18/2015  . Allergic rhinitis 07/14/2014  . Essential hypertension 07/14/2014  . History of uterine cancer 07/14/2014    Kermit Balo, PTA 05/22/18 5:00 PM   [facility] [address]  [address] [place], [place], [address] Phone: [phone]   Fax:  [phone]  Name: Cova Roblez MRN: [id number] Date of Birth: 1945-07-26

## 2018-05-26 ENCOUNTER — Ambulatory Visit: Payer: Medicare Other | Admitting: Family Medicine

## 2018-05-26 ENCOUNTER — Encounter: Payer: Medicare Other | Admitting: Physical Therapy

## 2018-05-28 ENCOUNTER — Encounter: Payer: Self-pay | Admitting: Physical Therapy

## 2018-05-28 ENCOUNTER — Ambulatory Visit: Payer: Medicare Other | Admitting: Physical Therapy

## 2018-05-28 ENCOUNTER — Ambulatory Visit: Payer: Medicare Other | Admitting: Cardiovascular Disease

## 2018-05-28 ENCOUNTER — Encounter: Payer: Self-pay | Admitting: Cardiovascular Disease

## 2018-05-28 VITALS — BP 141/78 | HR 63

## 2018-05-28 DIAGNOSIS — R29898 Other symptoms and signs involving the musculoskeletal system: Secondary | ICD-10-CM

## 2018-05-28 DIAGNOSIS — M542 Cervicalgia: Secondary | ICD-10-CM

## 2018-05-28 DIAGNOSIS — M545 Low back pain, unspecified: Secondary | ICD-10-CM

## 2018-05-28 DIAGNOSIS — R293 Abnormal posture: Secondary | ICD-10-CM

## 2018-05-28 DIAGNOSIS — E1169 Type 2 diabetes mellitus with other specified complication: Secondary | ICD-10-CM | POA: Diagnosis not present

## 2018-05-28 DIAGNOSIS — E785 Hyperlipidemia, unspecified: Secondary | ICD-10-CM | POA: Diagnosis not present

## 2018-05-28 DIAGNOSIS — G8929 Other chronic pain: Secondary | ICD-10-CM

## 2018-05-28 DIAGNOSIS — I2510 Atherosclerotic heart disease of native coronary artery without angina pectoris: Secondary | ICD-10-CM

## 2018-05-28 DIAGNOSIS — I10 Essential (primary) hypertension: Secondary | ICD-10-CM | POA: Diagnosis not present

## 2018-05-28 DIAGNOSIS — M6281 Muscle weakness (generalized): Secondary | ICD-10-CM

## 2018-05-28 NOTE — Patient Instructions (Signed)
Medication Instructions:  Your physician recommends that you continue on your current medications as directed. Please refer to the Current Medication list given to you today.  If you need a refill on your cardiac medications before your next appointment, please call your pharmacy.   Lab work: NONE If you have labs (blood work) drawn today and your tests are completely normal, you will receive your results only by: Marland Kitchen MyChart Message (if you have MyChart) OR . A paper copy in the mail If you have any lab test that is abnormal or we need to change your treatment, we will call you to review the results.  Testing/Procedures: NOE  Follow-Up: At [facility], you and your health needs are our priority.  As part of our continuing mission to provide you with exceptional heart care, we have created designated Provider Care Teams.  These Care Teams include your primary Cardiologist (physician) and Advanced Practice Providers (APPs -  Physician Assistants and Nurse Practitioners) who all work together to provide you with the care you need, when you need it. . You will need a follow up appointment in 12 months.  Please call our office 2 months in advance to schedule this appointment.  You may see Dr. Allyson Sabal or one of the following Advanced Practice Providers on your designated Care Team:   . Corine Shelter, [place] . Azalee Course, PA-C . Micah Flesher, PA-C . Joni Reining, DNP . Theodore Demark, PA-C . Judy Pimple, PA-C . Marjie Skiff, PA-C

## 2018-05-28 NOTE — Assessment & Plan Note (Signed)
History of essential hypertension blood pressure measured today 140/80.  She was on lisinopril which was discontinued by her PCP.

## 2018-05-28 NOTE — Therapy (Signed)
[facility] [address]  [address] [place], [place], [address] Phone: [phone]   Fax:  [phone]  Physical Therapy Treatment  Patient Details  Name: Kristina DecampJudith Ann Murguia MRN: [id number] Date of Birth: 20-Oct-1945 Referring Provider (PT): Norton BlizzardShane Hudnall, MD   Encounter Date: 05/28/2018  PT End of Session - 05/28/18 1047    Visit Number  8    Number of Visits  17    Date for PT Re-Evaluation  06/24/18    Authorization Type  UHC Medicare    PT Start Time  0956    PT Stop Time  1045    PT Time Calculation (min)  49 min    Activity Tolerance  Patient tolerated treatment well    Behavior During Therapy  [facility] for tasks assessed/performed       Past Medical History:  Diagnosis Date  . Diabetes mellitus without complication (HCC)   . Heart attack (HCC) 09/07/2017  . Urticaria     Past Surgical History:  Procedure Laterality Date  . ABDOMINAL HYSTERECTOMY  2006   b/l SPO and TAH, uterine cancer  . EYE SURGERY Bilateral 1960  . TUBAL LIGATION      Vitals:   05/28/18 0958  BP: (!) 141/78  Pulse: 63  SpO2: 97%    Subjective Assessment - 05/28/18 0956    Subjective  Reports she is a little nervous for her F/U with her cardiologist today. Reports that neck and back pain still persist, but not as severe or constant as it once was.     Pertinent History  MI 2019, DM, vascular dementia with behavior disturbance, CAD, GERD, HLD, hypokalemia, lumbosacral DDD, CKD III, HTN, hx of uterine CA    Diagnostic tests  03/12/18 lumbar xray: There is no acute bony abnormality of the lumbar spine. Mild chronic changes as described.    Currently in Pain?  No/denies    Pain Score  2     Pain Location  Neck    Pain Orientation  Right    Pain Descriptors / Indicators  Sharp    Pain Type  Chronic pain    Pain Score  1    Pain Location  Back    Pain Orientation  Right;Left;Lower    Pain Descriptors / Indicators  Aching    Pain Type   Chronic pain                       [facility] Adult PT Treatment/Exercise - 05/28/18 0001      Neck Exercises: Seated   Neck Retraction  10 reps;3 secs    Neck Retraction Limitations  with gentle manual OP at end range for increased ROM      Lumbar Exercises: Stretches   Single Knee to Chest Stretch  Right;Left;1 rep;30 seconds    Single Knee to Chest Stretch Limitations  to tolerance    Hip Flexor Stretch  Right;Left;2 reps;30 seconds    Hip Flexor Stretch Limitations  mod thomas with strap to tolerance    Piriformis Stretch  Right;Left;30 seconds;1 rep    Piriformis Stretch Limitations  KTOS       Lumbar Exercises: Aerobic   Nustep  Lvl 3, 6 min LEs only      Lumbar Exercises: Standing   Functional Squats  15 reps   cues to lead with bottom   Functional Squats Limitations  TM support     Row  Strengthening;Both;10 reps;Theraband  Theraband Level (Row)  Level 2 (Red);Level 3 (Green)    Row Limitations  1st set red, 2nd set green    Other Standing Lumbar Exercises  alternating marching with yellow TB around toes x20 at TM rail   cues for upright trunk   Other Standing Lumbar Exercises  sidestepping with yellow TB around toes 2x22ft      Lumbar Exercises: Supine   Other Supine Lumbar Exercises  B isometric hip flexion with B LEs on orange pball 10x5"   cues to avoid valsalva   Other Supine Lumbar Exercises  overhead yellow medball lift x10   with cues for posterior pelvic tilt     Neck Exercises: Stretches   Upper Trapezius Stretch  Right;1 rep;30 seconds    Upper Trapezius Stretch Limitations  to tolerance    Levator Stretch  Right;1 rep;30 seconds    Levator Stretch Limitations  to tolerance             PT Education - 05/28/18 1047    Education Details  update to HEP; administered green TB for row    Person(s) Educated  Patient    Methods  Explanation;Demonstration;Tactile cues;Verbal cues;Handout    Comprehension  Verbalized understanding;Returned  demonstration       PT Short Term Goals - 05/22/18 1504      PT SHORT TERM GOAL #1   Title  Patient to be independent with initial HEP.    Time  4    Period  Weeks    Status  Achieved        PT Long Term Goals - 05/06/18 1119      PT LONG TERM GOAL #1   Title  Patient to be independnet with advanced HEP.    Time  8    Period  Weeks    Status  On-going      PT LONG TERM GOAL #2   Title  Patient to demonstrate [facility] and pain-free lumbar AROM.    Time  8    Period  Weeks    Status  On-going      PT LONG TERM GOAL #3   Title  Patient to demonstrate B LE & UE strength >=4+/5.     Time  8    Period  Weeks    Status  On-going      PT LONG TERM GOAL #4   Title  Patient to report 50% improvement in pain levels with supine<>sit transfers.     Time  8    Period  Weeks    Status  On-going      PT LONG TERM GOAL #5   Title  Patient to report 50% improvement in cervical pain with ADLs.    Time  8    Period  Weeks    Status  On-going            Plan - 05/28/18 1048    Clinical Impression Statement  Patient arrived to session with report of improvement frequency and severity of neck and back pain since starting PT. Worked on standing LE strengthening with intermittent cues for upright trunk and correction of form with squats. Able to progress banded resistance with green TB today with good tolerance. Administered green TB to progress this exercise at home. Patient still with some difficulty with achieving end range with cervical retractions; provided gentle OP to increase ROM with good tolerance by patient. Patient demonstrated tightness in B hip flexors with modified Thomas stretch- updated HEP with  this exercise. Patient tolerated all ther-ex well today, however did report severe B thoracic muscle spasm when sitting up from supine at end of session which resolved once sitting. Plan to address thoracic mobility and periscapular strengthening next session. Patient without  complaints at end of session.     Clinical Impairments Affecting Rehab Potential  MI 2019, DM, vascular dementia with behavior disturbance, CAD, GERD, HLD, hypokalemia, lumbosacral DDD, CKD III, HTN, hx of uterine CA    PT Treatment/Interventions  ADLs/Self Care Home Management;Cryotherapy;Electrical Stimulation;Functional mobility training;Stair training;Gait training;DME Instruction;Ultrasound;Traction;Moist Heat;Therapeutic activities;Therapeutic exercise;Balance training;Neuromuscular re-education;Patient/family education;Passive range of motion;Manual techniques;Dry needling;Energy conservation;Splinting;Taping    PT Next Visit Plan  address thoracic mobility and periscapular strengthening next session    Consulted and Agree with Plan of Care  Patient       Patient will benefit from skilled therapeutic intervention in order to improve the following deficits and impairments:  Decreased range of motion, Difficulty walking, Increased muscle spasms, Decreased activity tolerance, Pain, Decreased balance, Impaired flexibility, Improper body mechanics, Postural dysfunction, Decreased strength  Visit Diagnosis: Chronic bilateral low back pain without sciatica  Cervicalgia  Other symptoms and signs involving the musculoskeletal system  Muscle weakness (generalized)  Abnormal posture     Problem List Patient Active Problem List   Diagnosis Date Noted  . Varicose veins of right lower extremity with complications 05/08/2018  . URI (upper respiratory infection) 05/08/2018  . Vascular dementia with behavior disturbance (HCC) 02/24/2018  . Myalgia 02/09/2018  . Seasonal allergic rhinitis due to pollen 01/21/2018  . Cough 12/23/2017  . Allergic urticaria 12/23/2017  . History of myocardial infarction in last year 12/23/2017  . Diabetes mellitus (HCC) 12/23/2017  . Rash and nonspecific skin eruption 12/23/2017  . Intrinsic atopic dermatitis 12/23/2017  . CAD (coronary artery disease)  09/25/2017  . Low back pain 09/25/2017  . Gastroesophageal reflux disease with esophagitis 09/04/2017  . Hyperlipidemia associated with type 2 diabetes mellitus (HCC) 12/31/2016  . Hypokalemia 10/28/2015  . DDD (degenerative disc disease), lumbosacral 09/13/2015  . Diverticulosis of large intestine 09/13/2015  . Seborrheic dermatitis of scalp 09/13/2015  . Chronic kidney disease, stage III (moderate) (HCC) 05/02/2015  . Type 2 diabetes mellitus without complication, without long-term current use of insulin (HCC) 01/18/2015  . Allergic rhinitis 07/14/2014  . Essential hypertension 07/14/2014  . History of uterine cancer 07/14/2014    Anette Guarneri, PT, DPT 05/28/18 10:52 AM   [facility] [address]  [address] [place], [place], [address] Phone: [phone]   Fax:  [phone]  Name: Abha Racanelli MRN: [id number] Date of Birth: 1945-10-28

## 2018-05-28 NOTE — Assessment & Plan Note (Signed)
History of hyperlipidemia on high-dose statin therapy with lipid profile performed 04/04/2018 revealing total cholesterol 100, LDL 46 and HDL 39.

## 2018-05-28 NOTE — Assessment & Plan Note (Signed)
History of CAD status post non-STEMI in [place] 09/02/2017 while on vacation.  She had stenting of her proximal LAD via the right radial approach.  She is on aspirin and Brilinta.  EF initially was 45 to 50% at the time of her non-STEMI however recent 2D echo performed 11/05/2017 revealed normalization of her EF.  Denies chest pain or shortness of breath.

## 2018-05-28 NOTE — Progress Notes (Signed)
05/28/2018 [facility]   [birth year]  [id number]  Primary Physician Bradd Canary, MD Primary Cardiologist: Runell Gess MD Nicholes Calamity, [place]  HPI:  Kristina Lee is a 73 y.o.  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  She was referred by [facility] for cardiovascular evaluation evaluation and to be established in my practice because of her recent non-STEMI and intervention.  I last saw her in the office 10/25/2017.  Her Risk factors include remote tobacco abuse having quit on 01/04/1988 after smoking 25 pack years.  She has treated hypertension, hyperlipidemia and diabetes which is not treated.  Her father did die of a microinfarction at age 57.  She had atypical symptoms leading up to a non-STEMI 09/02/2017 she was in [place] and had stenting of her proximal LAD via her right radial approach.  She had no other significant CAD.  EF was in the 45 to 50% range.  She is had no recurrent symptoms.  She remains on aspirin and Brilinta.  She had a 2D echo performed 11/05/2017 which was entirely normal.    Current Meds  Medication Sig  . aspirin EC 81 MG tablet Take 81 mg by mouth daily.  Marland Kitchen atorvastatin (LIPITOR) 80 MG tablet Take 1 tablet (80 mg total) by mouth daily.  . Calcium Carbonate-Vit D-Min (CALCIUM 600+D PLUS MINERALS) 600-400 MG-UNIT TABS Take 1 tablet by mouth daily.   . diclofenac sodium (VOLTAREN) 1 % GEL Apply 4 g topically 4 (four) times daily. As needed for pain  . Diphenhydramine Cit-Pseudoeph (BENADRYL ALLERGY/SINUS FASTMLT PO) Take 1 tablet by mouth daily.  Marland Kitchen doxycycline (VIBRA-TABS) 100 MG tablet Take 1 tablet (100 mg total) by mouth 2 (two) times daily.  . famotidine (PEPCID) 20 MG tablet Take 1 tablet (20 mg total) by mouth 2 (two) times daily.  . fexofenadine (ALLEGRA) 180 MG tablet Take 180 mg by mouth 2 (two) times daily.   Marland Kitchen lisinopril  (PRINIVIL,ZESTRIL) 2.5 MG tablet Take 1 tablet (2.5 mg total) by mouth daily.  . ticagrelor (BRILINTA) 90 MG TABS tablet Take 1 tablet (90 mg total) by mouth 2 (two) times daily.     Allergies  Allergen Reactions  . Amoxicillin Anaphylaxis  . Hydroxyzine Itching and Other (See Comments)    Other reaction(s): Other (See Comments) Sedation Sedation Sedation   . Sulfa Antibiotics Swelling    Other reaction(s): Swelling tongue tongue tongue     Social History   Socioeconomic History  . Marital status: Married    Spouse name: Not on file  . Number of children: Not on file  . Years of education: Not on file  . Highest education level: Not on file  Occupational History  . Not on file  Social Needs  . Financial resource strain: Not on file  . Food insecurity:    Worry: Not on file    Inability: Not on file  . Transportation needs:    Medical: Not on file    Non-medical: Not on file  Tobacco Use  . Smoking status: Former Smoker    Packs/day: 0.50    Years: 24.00    Pack years: 12.00    Types: Cigarettes    Last attempt to quit: 04/17/1987    Years since quitting: 31.1  . Smokeless tobacco: Never Used  Substance and Sexual Activity  . Alcohol use: Not Currently  . Drug  use: Not Currently  . Sexual activity: Yes  Lifestyle  . Physical activity:    Days per week: Not on file    Minutes per session: Not on file  . Stress: Not on file  Relationships  . Social connections:    Talks on phone: Not on file    Gets together: Not on file    Attends religious service: Not on file    Active member of club or organization: Not on file    Attends meetings of clubs or organizations: Not on file    Relationship status: Not on file  . Intimate partner violence:    Fear of current or ex partner: Not on file    Emotionally abused: Not on file    Physically abused: Not on file    Forced sexual activity: Not on file  Other Topics Concern  . Not on file  Social History Narrative   . Not on file     Review of Systems: General: negative for chills, fever, night sweats or weight changes.  Cardiovascular: negative for chest pain, dyspnea on exertion, edema, orthopnea, palpitations, paroxysmal nocturnal dyspnea or shortness of breath Dermatological: negative for rash Respiratory: negative for cough or wheezing Urologic: negative for hematuria Abdominal: negative for nausea, vomiting, diarrhea, bright red blood per rectum, melena, or hematemesis Neurologic: negative for visual changes, syncope, or dizziness All other systems reviewed and are otherwise negative except as noted above.    Blood pressure 140/80, pulse (!) 55, height 5\' 2"  (1.575 m), weight 133 lb (60.3 kg).  General appearance: alert and no distress Neck: no adenopathy, no carotid bruit, no JVD, supple, symmetrical, trachea midline and thyroid not enlarged, symmetric, no tenderness/mass/nodules Lungs: clear to auscultation bilaterally Heart: regular rate and rhythm, S1, S2 normal, no murmur, click, rub or gallop Extremities: extremities normal, atraumatic, no cyanosis or edema Pulses: 2+ and symmetric Skin: Skin color, texture, turgor normal. No rashes or lesions Neurologic: Alert and oriented X 3, normal strength and tone. Normal symmetric reflexes. Normal coordination and gait  EKG not performed today  ASSESSMENT AND PLAN:   Essential hypertension History of essential hypertension blood pressure measured today 140/80.  She was on lisinopril which was discontinued by her PCP.  Hyperlipidemia associated with type 2 diabetes mellitus (HCC) History of hyperlipidemia on high-dose statin therapy with lipid profile performed 04/04/2018 revealing total cholesterol 100, LDL 46 and HDL 39.  CAD (coronary artery disease) History of CAD status post non-STEMI in [place] 09/02/2017 while on vacation.  She had stenting of her proximal LAD via the right radial approach.  She is on aspirin and Brilinta.  EF  initially was 45 to 50% at the time of her non-STEMI however recent 2D echo performed 11/05/2017 revealed normalization of her EF.  Denies chest pain or shortness of breath.      Runell Gess MD FACP,FACC,FAHA, [place] 05/28/2018 4:02 PM

## 2018-05-30 ENCOUNTER — Ambulatory Visit: Payer: Medicare Other | Admitting: Family Medicine

## 2018-05-30 ENCOUNTER — Encounter: Payer: Self-pay | Admitting: Family Medicine

## 2018-05-30 VITALS — BP 157/73 | HR 51 | Ht 62.0 in | Wt 129.0 lb

## 2018-05-30 DIAGNOSIS — M545 Low back pain, unspecified: Secondary | ICD-10-CM

## 2018-05-30 NOTE — Patient Instructions (Signed)
You're doing great! Continue physical therapy and transition to home exercise program when they feel you've gotten to that point. Do home exercises on days you don't go to therapy. Tylenol, voltaren gel as needed. Follow up with me in 6 weeks.

## 2018-05-31 ENCOUNTER — Encounter: Payer: Self-pay | Admitting: Family Medicine

## 2018-05-31 NOTE — Progress Notes (Signed)
PCP: Bradd Canary, MD  Subjective:   HPI: Patient is a 73 y.o. female here for low back, hip pain.  1/2: Patient here with her husband. They report she's had problems with her low back dating to her birthday in May 2017. She woke up with fairly severe pain. Sought care and told she had 4 degenerative discs. Recalls seeing ortho and doing physical therapy, had good success and pain resolved. Similar issue in 2018, did well with physical therapy. Current pain may have started early in 2019. She had an episode in here of severe pain posterior bilateral back more to right side radiating into her knees. Couldn't bear weight for about 2 weeks. No bowel/bladder dysfunction. No skin changes, numbness. Pain level up to 6/10 on right, 1/10 on left.  2/14: Patient reports she's doing better. Taking tylenol with voltaren gel at times. Doing physical therapy and home exercises regularly. Pain down to 1/10 level at worst. No bowel/bladder dysfunction.  Past Medical History:  Diagnosis Date  . Diabetes mellitus without complication (HCC)   . Heart attack (HCC) 09/07/2017  . Urticaria     Current Outpatient Medications on File Prior to Visit  Medication Sig Dispense Refill  . aspirin EC 81 MG tablet Take 81 mg by mouth daily.    Marland Kitchen atorvastatin (LIPITOR) 80 MG tablet Take 1 tablet (80 mg total) by mouth daily. 90 tablet 1  . Calcium Carbonate-Vit D-Min (CALCIUM 600+D PLUS MINERALS) 600-400 MG-UNIT TABS Take 1 tablet by mouth daily.     . diclofenac sodium (VOLTAREN) 1 % GEL Apply 4 g topically 4 (four) times daily. As needed for pain 5 Tube 1  . Diphenhydramine Cit-Pseudoeph (BENADRYL ALLERGY/SINUS FASTMLT PO) Take 1 tablet by mouth daily.    Marland Kitchen doxycycline (VIBRA-TABS) 100 MG tablet Take 1 tablet (100 mg total) by mouth 2 (two) times daily. 20 tablet 0  . famotidine (PEPCID) 20 MG tablet Take 1 tablet (20 mg total) by mouth 2 (two) times daily. 60 tablet 5  . fexofenadine (ALLEGRA) 180  MG tablet Take 180 mg by mouth 2 (two) times daily.     Marland Kitchen lisinopril (PRINIVIL,ZESTRIL) 2.5 MG tablet Take 1 tablet (2.5 mg total) by mouth daily. 90 tablet 1  . ticagrelor (BRILINTA) 90 MG TABS tablet Take 1 tablet (90 mg total) by mouth 2 (two) times daily. 180 tablet 1   No current facility-administered medications on file prior to visit.     Past Surgical History:  Procedure Laterality Date  . ABDOMINAL HYSTERECTOMY  2006   b/l SPO and TAH, uterine cancer  . EYE SURGERY Bilateral 1960  . TUBAL LIGATION      Allergies  Allergen Reactions  . Amoxicillin Anaphylaxis  . Hydroxyzine Itching and Other (See Comments)    Other reaction(s): Other (See Comments) Sedation Sedation Sedation   . Sulfa Antibiotics Swelling    Other reaction(s): Swelling tongue tongue tongue     Social History   Socioeconomic History  . Marital status: Married    Spouse name: Not on file  . Number of children: Not on file  . Years of education: Not on file  . Highest education level: Not on file  Occupational History  . Not on file  Social Needs  . Financial resource strain: Not on file  . Food insecurity:    Worry: Not on file    Inability: Not on file  . Transportation needs:    Medical: Not on file    Non-medical: Not  on file  Tobacco Use  . Smoking status: Former Smoker    Packs/day: 0.50    Years: 24.00    Pack years: 12.00    Types: Cigarettes    Last attempt to quit: 04/17/1987    Years since quitting: 31.1  . Smokeless tobacco: Never Used  Substance and Sexual Activity  . Alcohol use: Not Currently  . Drug use: Not Currently  . Sexual activity: Yes  Lifestyle  . Physical activity:    Days per week: Not on file    Minutes per session: Not on file  . Stress: Not on file  Relationships  . Social connections:    Talks on phone: Not on file    Gets together: Not on file    Attends religious service: Not on file    Active member of club or organization: Not on file     Attends meetings of clubs or organizations: Not on file    Relationship status: Not on file  . Intimate partner violence:    Fear of current or ex partner: Not on file    Emotionally abused: Not on file    Physically abused: Not on file    Forced sexual activity: Not on file  Other Topics Concern  . Not on file  Social History Narrative  . Not on file    Family History  Problem Relation Age of Onset  . Cancer Mother        liver cancer, breast cancer x 2   . Alcohol abuse Mother        smoker  . Heart disease Father        MI  . Alcohol abuse Father        smoker  . Depression Sister   . Tremor Sister   . Cancer Brother        pancreatic  . Diabetes Maternal Grandmother   . Hearing loss Maternal Grandmother   . Depression Maternal Grandmother   . Allergic rhinitis Neg Hx   . Angioedema Neg Hx   . Asthma Neg Hx   . Eczema Neg Hx   . Immunodeficiency Neg Hx   . Urticaria Neg Hx     BP (!) 157/73   Pulse (!) 51   Ht 5\' 2"  (1.575 m)   Wt 129 lb (58.5 kg)   BMI 23.59 kg/m   Review of Systems: See HPI above.     Objective:  Physical Exam:  Gen: NAD, comfortable in exam room  Back: No gross deformity, scoliosis. No TTP currently paraspinal regions.  No midline or bony TTP. FROM with mild pain on extension, bilateral lateral rotations. Strength LEs 5/5 all muscle groups.   2+ MSRs in patellar and achilles tendons, equal bilaterally. Negative SLRs. Sensation intact to light touch bilaterally. Negative logroll bilateral hips Negative fabers and piriformis stretches.  Right hip: No deformity. FROM with 5/5 strength. Mild tenderness proximal IT band. NVI distally.   Assessment & Plan:  1. Low back pain into both legs - clinically improving.  Continue physical therapy with transition to home exercise program.  Tylenol, voltaren gel if needed.  F/u in 6 weeks.

## 2018-06-02 ENCOUNTER — Ambulatory Visit: Payer: Medicare Other | Admitting: Physical Therapy

## 2018-06-02 ENCOUNTER — Encounter: Payer: Self-pay | Admitting: Physical Therapy

## 2018-06-02 VITALS — BP 142/74 | HR 63

## 2018-06-02 DIAGNOSIS — M545 Low back pain: Principal | ICD-10-CM

## 2018-06-02 DIAGNOSIS — R293 Abnormal posture: Secondary | ICD-10-CM

## 2018-06-02 DIAGNOSIS — M6281 Muscle weakness (generalized): Secondary | ICD-10-CM

## 2018-06-02 DIAGNOSIS — M542 Cervicalgia: Secondary | ICD-10-CM

## 2018-06-02 DIAGNOSIS — G8929 Other chronic pain: Secondary | ICD-10-CM

## 2018-06-02 DIAGNOSIS — R29898 Other symptoms and signs involving the musculoskeletal system: Secondary | ICD-10-CM

## 2018-06-02 NOTE — Therapy (Signed)
[facility] [address]  [address] [place], [place], [address] Phone: [phone]   Fax:  [phone]  Physical Therapy Treatment  Patient Details  Name: Kristina Lee MRN: [id number] Date of Birth: [birth year] Referring Provider (PT): Norton Blizzard, MD   Encounter Date: 06/02/2018  PT End of Session - 06/02/18 1527    Visit Number  9    Number of Visits  17    Date for PT Re-Evaluation  06/24/18    Authorization Type  [employer] Medicare    PT Start Time  1445    PT Stop Time  1529    PT Time Calculation (min)  44 min    Activity Tolerance  Patient tolerated treatment well    Behavior During Therapy  [facility] for tasks assessed/performed       Past Medical History:  Diagnosis Date  . Diabetes mellitus without complication (HCC)   . Heart attack (HCC) 09/07/2017  . Urticaria     Past Surgical History:  Procedure Laterality Date  . ABDOMINAL HYSTERECTOMY  2006   b/l SPO and TAH, uterine cancer  . EYE SURGERY Bilateral 1960  . TUBAL LIGATION      Vitals:   06/02/18 1443  BP: (!) 142/74  Pulse: 63  SpO2: 98%    Subjective Assessment - 06/02/18 1448    Subjective  Reports that her cardiologist told her that her BP lately is nothing to worry about. Reports nothing new with neck and back pain. Also notes intermittent R hip pain. Reports her upper back pain occurs mostly when she gets up from bed.     Pertinent History  MI 2019, DM, vascular dementia with behavior disturbance, CAD, GERD, HLD, hypokalemia, lumbosacral DDD, CKD III, HTN, hx of uterine CA    Diagnostic tests  03/12/18 lumbar xray: There is no acute bony abnormality of the lumbar spine. Mild chronic changes as described.    Currently in Pain?  No/denies                       [facility] Adult PT Treatment/Exercise - 06/02/18 0001      Neck Exercises: Seated   Neck Retraction  10 reps;3 secs    Neck Retraction Limitations  correction of form  required      Lumbar Exercises: Aerobic   Nustep  Lvl 3, 6 min LEs only      Lumbar Exercises: Sidelying   Other Sidelying Lumbar Exercises  R & L open book stretch x10 to tolerance   cues to avoid hip rotation      Lumbar Exercises: Prone   Other Prone Lumbar Exercises  prone on elbows x7 with cues and demonstration for proper form   cues for scapular retraction and avoiding pushing inot pain     Shoulder Exercises: Seated   Row  Strengthening;Both;10 reps;Theraband    Theraband Level (Shoulder Row)  Level 2 (Red);Level 3 (Green)    Row Limitations  2x10; heavy cues for scapular retraction and upright trunk      Shoulder Exercises: Lawyer  2 reps;20 seconds    Corner Stretch Limitations  doorway pec stretch to tolerance   cues for form and to avoid pushing into pain     Manual Therapy   Manual Therapy  Soft tissue mobilization    Manual therapy comments  prone    Soft tissue mobilization  STM to R piriformis, glute- TTP at medial  piriformis; STM to B thoracic paraspinals and rhomboids- very TTP and soft tissue restriciton R>L    Myofascial Release  TPR to R piriformis               PT Short Term Goals - 05/22/18 1504      PT SHORT TERM GOAL #1   Title  Patient to be independent with initial HEP.    Time  4    Period  Weeks    Status  Achieved        PT Long Term Goals - 05/06/18 1119      PT LONG TERM GOAL #1   Title  Patient to be independnet with advanced HEP.    Time  8    Period  Weeks    Status  On-going      PT LONG TERM GOAL #2   Title  Patient to demonstrate [facility] and pain-free lumbar AROM.    Time  8    Period  Weeks    Status  On-going      PT LONG TERM GOAL #3   Title  Patient to demonstrate B LE & UE strength >=4+/5.     Time  8    Period  Weeks    Status  On-going      PT LONG TERM GOAL #4   Title  Patient to report 50% improvement in pain levels with supine<>sit transfers.     Time  8    Period  Weeks    Status   On-going      PT LONG TERM GOAL #5   Title  Patient to report 50% improvement in cervical pain with ADLs.    Time  8    Period  Weeks    Status  On-going            Plan - 06/02/18 1529    Clinical Impression Statement  Patient arrived to session with report that her Cardiologist settled her worries about her BP, advised it was not anything to worry about at this time. Patient with c/o several locations of pain over the last couple days addressed these areas with manual therapy. Patient tolerated STM to R piriformis, glute, B thoracic paraspinals and rhomboids- patient with considerable tenderness in medial piriformis and R side of thoracic musculature. Worked on periscapular strengthening and pec stretching at end of session for postural correction. Patient reporting "I feel good" at end of session.     Clinical Impairments Affecting Rehab Potential  MI 2019, DM, vascular dementia with behavior disturbance, CAD, GERD, HLD, hypokalemia, lumbosacral DDD, CKD III, HTN, hx of uterine CA    PT Treatment/Interventions  ADLs/Self Care Home Management;Cryotherapy;Electrical Stimulation;Functional mobility training;Stair training;Gait training;DME Instruction;Ultrasound;Traction;Moist Heat;Therapeutic activities;Therapeutic exercise;Balance training;Neuromuscular re-education;Patient/family education;Passive range of motion;Manual techniques;Dry needling;Energy conservation;Splinting;Taping    PT Next Visit Plan  progress periscapular and hip strength    Consulted and Agree with Plan of Care  Patient       Patient will benefit from skilled therapeutic intervention in order to improve the following deficits and impairments:  Decreased range of motion, Difficulty walking, Increased muscle spasms, Decreased activity tolerance, Pain, Decreased balance, Impaired flexibility, Improper body mechanics, Postural dysfunction, Decreased strength  Visit Diagnosis: Chronic bilateral low back pain without  sciatica  Cervicalgia  Other symptoms and signs involving the musculoskeletal system  Muscle weakness (generalized)  Abnormal posture     Problem List Patient Active Problem List   Diagnosis Date Noted  . Varicose veins  of right lower extremity with complications 05/08/2018  . URI (upper respiratory infection) 05/08/2018  . Vascular dementia with behavior disturbance (HCC) 02/24/2018  . Myalgia 02/09/2018  . Seasonal allergic rhinitis due to pollen 01/21/2018  . Cough 12/23/2017  . Allergic urticaria 12/23/2017  . History of myocardial infarction in last year 12/23/2017  . Diabetes mellitus (HCC) 12/23/2017  . Rash and nonspecific skin eruption 12/23/2017  . Intrinsic atopic dermatitis 12/23/2017  . CAD (coronary artery disease) 09/25/2017  . Low back pain 09/25/2017  . Gastroesophageal reflux disease with esophagitis 09/04/2017  . Hyperlipidemia associated with type 2 diabetes mellitus (HCC) 12/31/2016  . Hypokalemia 10/28/2015  . DDD (degenerative disc disease), lumbosacral 09/13/2015  . Diverticulosis of large intestine 09/13/2015  . Seborrheic dermatitis of scalp 09/13/2015  . Chronic kidney disease, stage III (moderate) (HCC) 05/02/2015  . Type 2 diabetes mellitus without complication, without long-term current use of insulin (HCC) 01/18/2015  . Allergic rhinitis 07/14/2014  . Essential hypertension 07/14/2014  . History of uterine cancer 07/14/2014    Anette Guarneri, PT, DPT 06/02/18 3:36 PM   [facility] [address]  [address] [place], [place], [address] Phone: [phone]   Fax:  [phone]  Name: Kristina Lee MRN: [id number] Date of Birth: [birth year]

## 2018-06-05 ENCOUNTER — Ambulatory Visit: Payer: Medicare Other

## 2018-06-11 ENCOUNTER — Ambulatory Visit: Payer: Medicare Other

## 2018-06-11 DIAGNOSIS — M6281 Muscle weakness (generalized): Secondary | ICD-10-CM

## 2018-06-11 DIAGNOSIS — G8929 Other chronic pain: Secondary | ICD-10-CM

## 2018-06-11 DIAGNOSIS — R293 Abnormal posture: Secondary | ICD-10-CM

## 2018-06-11 DIAGNOSIS — M545 Low back pain: Secondary | ICD-10-CM | POA: Diagnosis not present

## 2018-06-11 DIAGNOSIS — R29898 Other symptoms and signs involving the musculoskeletal system: Secondary | ICD-10-CM

## 2018-06-11 DIAGNOSIS — M542 Cervicalgia: Secondary | ICD-10-CM

## 2018-06-11 NOTE — Therapy (Signed)
[facility] [address]  [place], [place], [address] Phone: [phone]   Fax:  [phone]  Physical Therapy Progress Note  Patient Details  Name: Kristina Lee MRN: [id number] Date of Birth: Dec 04, 1945 Referring Provider (PT): Karlton Lemon, MD   Progress Note Reporting Period 04/29/18 to 06/11/18  See note below for Objective Data and Assessment of Progress/Goals.    Encounter Date: 06/11/2018  PT End of Session - 06/11/18 1407    Visit Number  10    Number of Visits  18    Date for PT Re-Evaluation  07/09/18    Authorization Type  [employer] Medicare    PT Start Time  1400    PT Stop Time  1450   Ended visit with 10 min moist heat   PT Time Calculation (min)  50 min    Activity Tolerance  Patient tolerated treatment well    Behavior During Therapy  WFL for tasks assessed/performed       Past Medical History:  Diagnosis Date  . Diabetes mellitus without complication ([place])   . Heart attack ([place]) 09/07/2017  . Urticaria     Past Surgical History:  Procedure Laterality Date  . ABDOMINAL HYSTERECTOMY  2006   b/l SPO and TAH, uterine cancer  . EYE SURGERY Bilateral 1960  . TUBAL LIGATION      There were no vitals filed for this visit.  Subjective Assessment - 06/11/18 1405    Subjective  Pt. reporting back pain is primary concern today.      Pertinent History  MI 2019, DM, vascular dementia with behavior disturbance, CAD, GERD, HLD, hypokalemia, lumbosacral DDD, CKD III, HTN, hx of uterine CA    Diagnostic tests  03/12/18 lumbar xray: There is no acute bony abnormality of the lumbar spine. Mild chronic changes as described.    Currently in Pain?  Yes    Pain Score  3     Pain Location  Neck    Pain Orientation  Right    Pain Descriptors / Indicators  Sharp    Pain Type  Chronic pain    Multiple Pain Sites  Yes    Pain Score  2    Pain Location  Back    Pain Orientation  Right;Left;Lower     Pain Descriptors / Indicators  Aching    Pain Type  Chronic pain    Pain Radiating Towards  into R buttocks     Pain Score  5    Pain Location  Knee   L lateral knee cap    Pain Orientation  Left    Pain Descriptors / Indicators  Sore;Aching    Pain Type  Acute pain         [facility] PT Assessment - 06/11/18 0001      Assessment   Medical Diagnosis  R acute LBP, R ITB syndrome    Referring Provider (PT)  Karlton Lemon, MD    Onset Date/Surgical Date  04/29/16      AROM   Lumbar Flexion  toes - mild pain at end range     Lumbar Extension  mod limitation  - mild pain at end range     Lumbar - Right Side Bend  joint line - LBP up to 7/10    Lumbar - Left Side Bend  joint line     Lumbar - Right Rotation  mildly limited - LBP     Lumbar - Left  Rotation  mildly limied - mid back pain       Strength   Strength Assessment Site  Shoulder    Right/Left Shoulder  Right;Left    Right Shoulder Flexion  4+/5    Right Shoulder ABduction  4+/5    Right Shoulder Internal Rotation  4+/5    Right Shoulder External Rotation  4/5    Left Shoulder Flexion  4+/5    Left Shoulder ABduction  4+/5    Left Shoulder Internal Rotation  4/5    Left Shoulder External Rotation  4/5    Right/Left Hip  Right;Left    Right Hip Flexion  4+/5    Right Hip ABduction  4+/5    Right Hip ADduction  4+/5    Left Hip Flexion  4/5    Left Hip ABduction  4+/5    Left Hip ADduction  4/5    Right/Left Knee  Right;Left    Right Knee Flexion  5/5    Right Knee Extension  5/5    Left Knee Flexion  5/5    Left Knee Extension  5/5    Right/Left Ankle  Right;Left    Right Ankle Dorsiflexion  5/5    Right Ankle Plantar Flexion  5/5    Left Ankle Dorsiflexion  5/5    Left Ankle Plantar Flexion  5/5                   [facility] Adult PT Treatment/Exercise - 06/11/18 0001      Lumbar Exercises: Supine   Ab Set  10 reps;3 seconds    AB Set Limitations  + adduction ball squeeze     Clam  15 reps;3 seconds     Clam Limitations  red band + abdom. bracing     Bent Knee Raise  10 reps;3 seconds    Bent Knee Raise Limitations  red band + abdom. bracing     Straight Leg Raise  10 reps    Straight Leg Raises Limitations  B       Modalities   Modalities  Moist Heat      Moist Heat Therapy   Number Minutes Moist Heat  10 Minutes    Moist Heat Location  Lumbar Spine   mid back      Manual Therapy   Manual Therapy  Soft tissue mobilization    Manual therapy comments  seated     Soft tissue mobilization  STM to B lumbar and thoracic paraspinals              PT Education - 06/11/18 1535    Education Details  HEP update    Person(s) Educated  Patient    Methods  Explanation;Demonstration;Verbal cues;Handout    Comprehension  Verbalized understanding;Returned demonstration;Verbal cues required;Need further instruction       PT Short Term Goals - 05/22/18 1504      PT SHORT TERM GOAL #1   Title  Patient to be independent with initial HEP.    Time  4    Period  Weeks    Status  Achieved        PT Long Term Goals - 06/11/18 1410      PT LONG TERM GOAL #1   Title  Patient to be independnet with advanced HEP.    Time  4    Period  Weeks    Status  Partially Met    Target Date  07/09/18      PT  LONG TERM GOAL #2   Title  Patient to demonstrate [facility] and pain-free lumbar AROM.    Time  4    Period  Weeks    Status  Partially Met    Target Date  07/09/18      PT LONG TERM GOAL #3   Title  Patient to demonstrate B LE & UE strength >=4+/5.     Time  4    Period  Weeks    Status  Partially Met    Target Date  07/09/18      PT LONG TERM GOAL #4   Title  Patient to report 50% improvement in pain levels with supine<>sit transfers.     Time  4    Period  Weeks    Status  Partially Met   06/11/18: Pt. reporting 50% impromement "most of the time".   Target Date  07/09/18      PT LONG TERM GOAL #5   Title  Patient to report 50% improvement in cervical pain with ADLs.    Time   4    Period  Weeks    Status  On-going   06/11/18: 25% improvement   Target Date  07/09/18            Plan - 06/11/18 1408    Clinical Impression Statement  Pt. has made good progress with therapy.  Primary concern is back pain today.  Able to partially achieve LTG #1 now independent with HEP up to this point.  Able to partially achieve LTG #2 now with remaining lumbar AROM deficits in B rotation and extension and noting less pain overall with these motions.  Able to partially achieve LTG #3 demonstrating improved LE/shoulder strength now demonstrating remaining strength deficits at L proximal hip and shoulder musculature.  Pt. reporting ~ 50% improvement in pain levels with sit<>stand transition movement most of the time thus partially achieving LTG #4.  Reviewed technique with sit<>stand with pt. able to demo good overall technique as to reduce lumbar strain.  Pt. reporting ~ 25% improvement in pain with ADLs at this point with LTG #5 ongoing.  Pt. wishes to continue with therapy to focus on improving posture and reducing back pain with daily activities.      Clinical Impairments Affecting Rehab Potential  MI 2019, DM, vascular dementia with behavior disturbance, CAD, GERD, HLD, hypokalemia, lumbosacral DDD, CKD III, HTN, hx of uterine CA    PT Frequency  2x / week    PT Duration  4 weeks    PT Treatment/Interventions  ADLs/Self Care Home Management;Cryotherapy;Electrical Stimulation;Functional mobility training;Stair training;Gait training;DME Instruction;Ultrasound;Traction;Moist Heat;Therapeutic activities;Therapeutic exercise;Balance training;Neuromuscular re-education;Patient/family education;Passive range of motion;Manual techniques;Dry needling;Energy conservation;Splinting;Taping    PT Next Visit Plan  check tolerance for updated HEP; progress periscapular and hip strength    Consulted and Agree with Plan of Care  Patient       Patient will benefit from skilled therapeutic  intervention in order to improve the following deficits and impairments:  Decreased range of motion, Difficulty walking, Increased muscle spasms, Decreased activity tolerance, Pain, Decreased balance, Impaired flexibility, Improper body mechanics, Postural dysfunction, Decreased strength  Visit Diagnosis: Chronic bilateral low back pain without sciatica  Cervicalgia  Other symptoms and signs involving the musculoskeletal system  Muscle weakness (generalized)  Abnormal posture     Problem List Patient Active Problem List   Diagnosis Date Noted  . Varicose veins of right lower extremity with complications 47/12/6281  . URI (upper respiratory infection) 05/08/2018  .  Vascular dementia with behavior disturbance ([place]) 02/24/2018  . Myalgia 02/09/2018  . Seasonal allergic rhinitis due to pollen 01/21/2018  . Cough 12/23/2017  . Allergic urticaria 12/23/2017  . History of myocardial infarction in last year 12/23/2017  . Diabetes mellitus ([place]) 12/23/2017  . Rash and nonspecific skin eruption 12/23/2017  . Intrinsic atopic dermatitis 12/23/2017  . CAD (coronary artery disease) 09/25/2017  . Low back pain 09/25/2017  . Gastroesophageal reflux disease with esophagitis 09/04/2017  . Hyperlipidemia associated with type 2 diabetes mellitus ([place]) 12/31/2016  . Hypokalemia 10/28/2015  . DDD (degenerative disc disease), lumbosacral 09/13/2015  . Diverticulosis of large intestine 09/13/2015  . Seborrheic dermatitis of scalp 09/13/2015  . Chronic kidney disease, stage III (moderate) ([place]) 05/02/2015  . Type 2 diabetes mellitus without complication, without long-term current use of insulin ([place]) 01/18/2015  . Allergic rhinitis 07/14/2014  . Essential hypertension 07/14/2014  . History of uterine cancer 07/14/2014    Bess Harvest, PTA 06/11/18 6:06 PM   [facility] [address]  [place], [place], [address] Phone:  [phone]   Fax:  [phone]  Name: Kristina Lee MRN: [id number] Date of Birth: 1945-05-18    Patient has shown good progress with PT thus far- notes that she has noticed improvement in ability to perform transfers with decreased pain. Also notes improvement in cervical pain with her ADLs. Patient has demonstrated improvements in B shoulder and LE strength, with B shoulder rotation and L hip limitations still demonstrated with testing today. Patient would benefit from additional skilled PT services 2x/week for 4 weeks to address remaining goals and continue addressing proper form with HEP as patient requiring additional cues.   Janene Harvey, PT, DPT 06/11/18 6:11 PM

## 2018-06-12 ENCOUNTER — Other Ambulatory Visit: Payer: Self-pay | Admitting: Family Medicine

## 2018-06-13 ENCOUNTER — Ambulatory Visit: Payer: Medicare Other

## 2018-06-13 VITALS — BP 140/80 | HR 64

## 2018-06-13 DIAGNOSIS — M6281 Muscle weakness (generalized): Secondary | ICD-10-CM

## 2018-06-13 DIAGNOSIS — M542 Cervicalgia: Secondary | ICD-10-CM

## 2018-06-13 DIAGNOSIS — G8929 Other chronic pain: Secondary | ICD-10-CM

## 2018-06-13 DIAGNOSIS — R293 Abnormal posture: Secondary | ICD-10-CM

## 2018-06-13 DIAGNOSIS — R29898 Other symptoms and signs involving the musculoskeletal system: Secondary | ICD-10-CM

## 2018-06-13 DIAGNOSIS — M545 Low back pain: Secondary | ICD-10-CM | POA: Diagnosis not present

## 2018-06-13 NOTE — Therapy (Signed)
[facility] [address]  [place], [place], [address] Phone: [phone]   Fax:  [phone]  Physical Therapy Treatment  Patient Details  Name: Kristina Lee MRN: [id number] Date of Birth: Jun 26, 1945 Referring Provider (PT): Karlton Lemon, MD   Encounter Date: 06/13/2018  PT End of Session - 06/13/18 1101    Visit Number  11    Number of Visits  18    Date for PT Re-Evaluation  07/09/18    Authorization Type  UHC Medicare    PT Start Time  1056    PT Stop Time  1152   Ended session with 10 min moist heat   PT Time Calculation (min)  56 min    Activity Tolerance  Patient tolerated treatment well    Behavior During Therapy  [facility] for tasks assessed/performed       Past Medical History:  Diagnosis Date  . Diabetes mellitus without complication ([place])   . Heart attack ([place]) 09/07/2017  . Urticaria     Past Surgical History:  Procedure Laterality Date  . ABDOMINAL HYSTERECTOMY  2006   b/l SPO and TAH, uterine cancer  . EYE SURGERY Bilateral 1960  . TUBAL LIGATION      Vitals:   06/13/18 1058  BP: 140/80  Pulse: 64  SpO2: 98%    Subjective Assessment - 06/13/18 1058    Subjective  Pt. reporting no back pain today.      Pertinent History  MI 2019, DM, vascular dementia with behavior disturbance, CAD, GERD, HLD, hypokalemia, lumbosacral DDD, CKD III, HTN, hx of uterine CA    Diagnostic tests  03/12/18 lumbar xray: There is no acute bony abnormality of the lumbar spine. Mild chronic changes as described.    Currently in Pain?  Yes    Pain Score  4     Pain Location  Neck    Pain Orientation  Right    Pain Descriptors / Indicators  Sharp    Pain Type  Chronic pain    Multiple Pain Sites  No         [facility] PT Assessment - 06/13/18 0001      Assessment   Medical Diagnosis  R acute LBP, R ITB syndrome    Referring Provider (PT)  Karlton Lemon, MD    Onset Date/Surgical Date  04/29/16    Hand  Dominance  Right    Next MD Visit  07/11/18    Prior Therapy  Yes- for LBP                   [facility] Adult PT Treatment/Exercise - 06/13/18 1114      Lumbar Exercises: Stretches   Lower Trunk Rotation  --   5" x 10 reps    Lower Trunk Rotation Limitations  ROM R >L       Lumbar Exercises: Aerobic   Nustep  Lvl 3, 6 min LEs only      Lumbar Exercises: Standing   Heel Raises  15 reps;3 seconds    Heel Raises Limitations  TM rail     Functional Squats  15 reps    Functional Squats Limitations  TM support - cues to limit depth as to avoid knee pain ~ 70 dg knee flexion depth with cues for abdom. bracing     Other Standing Lumbar Exercises  Standing B lumbar rotation B 3" x 10 reps each way    Cues for slow pacing  and to avoid painful arc of movement    Other Standing Lumbar Exercises  B side stepping and monster walk with yellow looped TB at ankles and cues for abdom. bracing 2 x 20 ft each way       Lumbar Exercises: Supine   Ab Set  15 reps;3 seconds    AB Set Limitations  + adduction ball squeeze     Bridge with Ball Squeeze  10 reps;3 seconds    Straight Leg Raise  10 reps   3" hold   Straight Leg Raises Limitations  B       Knee/Hip Exercises: Standing   Forward Step Up  Right;10 reps;Step Height: 6";Hand Hold: 1    Forward Step Up Limitations  at sink      Moist Heat Therapy   Number Minutes Moist Heat  10 Minutes    Moist Heat Location  Lumbar Spine   mid and lower back            PT Education - 06/13/18 1150    Education Details  Education on hot/cold pack for lower back as pt. noting benefit from this in session     Person(s) Educated  Patient    Methods  Explanation;Verbal cues;Handout    Comprehension  Verbalized understanding;Verbal cues required;Need further instruction       PT Short Term Goals - 05/22/18 1504      PT SHORT TERM GOAL #1   Title  Patient to be independent with initial HEP.    Time  4    Period  Weeks    Status  Achieved         PT Long Term Goals - 06/11/18 1410      PT LONG TERM GOAL #1   Title  Patient to be independnet with advanced HEP.    Time  4    Period  Weeks    Status  Partially Met    Target Date  07/09/18      PT LONG TERM GOAL #2   Title  Patient to demonstrate [facility] and pain-free lumbar AROM.    Time  4    Period  Weeks    Status  Partially Met    Target Date  07/09/18      PT LONG TERM GOAL #3   Title  Patient to demonstrate B LE & UE strength >=4+/5.     Time  4    Period  Weeks    Status  Partially Met    Target Date  07/09/18      PT LONG TERM GOAL #4   Title  Patient to report 50% improvement in pain levels with supine<>sit transfers.     Time  4    Period  Weeks    Status  Partially Met   06/11/18: Pt. reporting 50% impromement "most of the time".   Target Date  07/09/18      PT LONG TERM GOAL #5   Title  Patient to report 50% improvement in cervical pain with ADLs.    Time  4    Period  Weeks    Status  On-going   06/11/18: 25% improvement   Target Date  07/09/18            Plan - 06/13/18 1126    Clinical Impression Statement  Pt. reporting she felt fine after last visit and reports improvement in back pain since using moist heat to end last session to lumbar/thoracic spine.  Provided educational handout today for ice/heat pack available on [facility] for purchase with pt. expressing interest in this.  Dorlisa tolerated progression of lumbopelvic strengthening activities today and additional lumbar ROM activities well with slight rise in LBP thus ended session with moist heat to lumbar/thoracic spine with pt. reporting she was pain free following.  Pt. to return to therapy after trip to [facility].      Rehab Potential  Good    Clinical Impairments Affecting Rehab Potential  MI 2019, DM, vascular dementia with behavior disturbance, CAD, GERD, HLD, hypokalemia, lumbosacral DDD, CKD III, HTN, hx of uterine CA    PT Treatment/Interventions  ADLs/Self Care Home  Management;Cryotherapy;Electrical Stimulation;Functional mobility training;Stair training;Gait training;DME Instruction;Ultrasound;Traction;Moist Heat;Therapeutic activities;Therapeutic exercise;Balance training;Neuromuscular re-education;Patient/family education;Passive range of motion;Manual techniques;Dry needling;Energy conservation;Splinting;Taping    PT Next Visit Plan  Progress periscapular and hip strength    Consulted and Agree with Plan of Care  Patient       Patient will benefit from skilled therapeutic intervention in order to improve the following deficits and impairments:  Decreased range of motion, Difficulty walking, Increased muscle spasms, Decreased activity tolerance, Pain, Decreased balance, Impaired flexibility, Improper body mechanics, Postural dysfunction, Decreased strength  Visit Diagnosis: Chronic bilateral low back pain without sciatica  Cervicalgia  Other symptoms and signs involving the musculoskeletal system  Muscle weakness (generalized)  Abnormal posture     Problem List Patient Active Problem List   Diagnosis Date Noted  . Varicose veins of right lower extremity with complications 02/07/8526  . URI (upper respiratory infection) 05/08/2018  . Vascular dementia with behavior disturbance ([place]) 02/24/2018  . Myalgia 02/09/2018  . Seasonal allergic rhinitis due to pollen 01/21/2018  . Cough 12/23/2017  . Allergic urticaria 12/23/2017  . History of myocardial infarction in last year 12/23/2017  . Diabetes mellitus ([place]) 12/23/2017  . Rash and nonspecific skin eruption 12/23/2017  . Intrinsic atopic dermatitis 12/23/2017  . CAD (coronary artery disease) 09/25/2017  . Low back pain 09/25/2017  . Gastroesophageal reflux disease with esophagitis 09/04/2017  . Hyperlipidemia associated with type 2 diabetes mellitus ([place]) 12/31/2016  . Hypokalemia 10/28/2015  . DDD (degenerative disc disease), lumbosacral 09/13/2015  . Diverticulosis of large intestine  09/13/2015  . Seborrheic dermatitis of scalp 09/13/2015  . Chronic kidney disease, stage III (moderate) ([place]) 05/02/2015  . Type 2 diabetes mellitus without complication, without long-term current use of insulin ([place]) 01/18/2015  . Allergic rhinitis 07/14/2014  . Essential hypertension 07/14/2014  . History of uterine cancer 07/14/2014    Bess Harvest, PTA 06/13/18 12:00 PM  [facility] [address]  [place], [place], [address] Phone: [phone]   Fax:  [phone]  Name: Kristina Lee MRN: [id number] Date of Birth: 1945/12/13

## 2018-06-25 ENCOUNTER — Ambulatory Visit: Payer: Medicare Other | Attending: Family Medicine | Admitting: Physical Therapy

## 2018-06-25 DIAGNOSIS — G8929 Other chronic pain: Secondary | ICD-10-CM | POA: Insufficient documentation

## 2018-06-25 DIAGNOSIS — M6281 Muscle weakness (generalized): Secondary | ICD-10-CM | POA: Insufficient documentation

## 2018-06-25 DIAGNOSIS — M545 Low back pain: Secondary | ICD-10-CM | POA: Insufficient documentation

## 2018-06-25 DIAGNOSIS — R293 Abnormal posture: Secondary | ICD-10-CM | POA: Insufficient documentation

## 2018-06-25 DIAGNOSIS — M542 Cervicalgia: Secondary | ICD-10-CM | POA: Insufficient documentation

## 2018-06-25 DIAGNOSIS — R29898 Other symptoms and signs involving the musculoskeletal system: Secondary | ICD-10-CM | POA: Insufficient documentation

## 2018-06-27 ENCOUNTER — Other Ambulatory Visit: Payer: Self-pay

## 2018-06-27 ENCOUNTER — Ambulatory Visit: Payer: Medicare Other

## 2018-06-27 VITALS — BP 132/76 | HR 56

## 2018-06-27 DIAGNOSIS — G8929 Other chronic pain: Secondary | ICD-10-CM

## 2018-06-27 DIAGNOSIS — M545 Low back pain, unspecified: Secondary | ICD-10-CM

## 2018-06-27 DIAGNOSIS — R29898 Other symptoms and signs involving the musculoskeletal system: Secondary | ICD-10-CM

## 2018-06-27 DIAGNOSIS — R293 Abnormal posture: Secondary | ICD-10-CM

## 2018-06-27 DIAGNOSIS — M542 Cervicalgia: Secondary | ICD-10-CM | POA: Diagnosis present

## 2018-06-27 DIAGNOSIS — M6281 Muscle weakness (generalized): Secondary | ICD-10-CM

## 2018-06-27 NOTE — Therapy (Signed)
[facility] [address]  [place], [place], [address] Phone: [phone]   Fax:  [phone]  Physical Therapy Treatment  Patient Details  Name: Kristina Lee MRN: [id number] Date of Birth: 01-20-1946 Referring Provider (PT): Karlton Lemon, MD   Encounter Date: 06/27/2018  PT End of Session - 06/27/18 1112    Visit Number  12    Number of Visits  18    Date for PT Re-Evaluation  07/09/18    Authorization Type  UHC Medicare    PT Start Time  1100    PT Stop Time  1144    PT Time Calculation (min)  44 min    Activity Tolerance  Patient tolerated treatment well    Behavior During Therapy  [facility] for tasks assessed/performed       Past Medical History:  Diagnosis Date  . Diabetes mellitus without complication ([place])   . Heart attack ([place]) 09/07/2017  . Urticaria     Past Surgical History:  Procedure Laterality Date  . ABDOMINAL HYSTERECTOMY  2006   b/l SPO and TAH, uterine cancer  . EYE SURGERY Bilateral 1960  . TUBAL LIGATION      Vitals:   06/27/18 1112  BP: 132/76  Pulse: (!) 56  SpO2: 98%    Subjective Assessment - 06/27/18 1112    Subjective  Pt. reporting hip and back pain is now less intense and lasts shorter duration now.  Pt. noting 60% improvement in pain levels since starting therapy.      Pertinent History  MI 2019, DM, vascular dementia with behavior disturbance, CAD, GERD, HLD, hypokalemia, lumbosacral DDD, CKD III, HTN, hx of uterine CA    Diagnostic tests  03/12/18 lumbar xray: There is no acute bony abnormality of the lumbar spine. Mild chronic changes as described.    Currently in Pain?  Yes    Pain Score  0-No pain   pain up to 1/10 at worst    Multiple Pain Sites  No         [facility] PT Assessment - 06/27/18 0001      AROM   Cervical Flexion  56    Cervical Extension  50    Cervical - Right Side Bend  35    Cervical - Left Side Bend  21    Cervical - Right Rotation  60     Cervical - Left Rotation  64                   [facility] Adult PT Treatment/Exercise - 06/27/18 1121      Neck Exercises: Theraband   Shoulder External Rotation  15 reps    Shoulder External Rotation Limitations  Seated in chair with pool noodle to promote upright posture    Horizontal ABduction  10 reps;Red    Horizontal ABduction Limitations  seated in chair with red TB leaning on pool noodle to cue upright posture    Other Theraband Exercises  Alternating shoulder flexion/ext. with yellow TB x 10 reps seated in chair with pool noodle       Lumbar Exercises: Aerobic   Nustep  Lvl 3, 6 min LEs only      Lumbar Exercises: Machines for Strengthening   Other Lumbar Machine Exercise  B low row 10# x 15 reps; cues for scapular retraction       Neck Exercises: Stretches   Upper Trapezius Stretch  Right;1 rep;30 seconds    Upper  Trapezius Stretch Limitations  to tolerance    Levator Stretch  Right;1 rep;30 seconds    Levator Stretch Limitations  to tolerance             PT Education - 06/27/18 1149    Education Details  HEP update; red TB issued to pt.     Person(s) Educated  Patient    Methods  Explanation;Demonstration;Verbal cues;Handout    Comprehension  Verbalized understanding;Returned demonstration;Verbal cues required;Need further instruction       PT Short Term Goals - 05/22/18 1504      PT SHORT TERM GOAL #1   Title  Patient to be independent with initial HEP.    Time  4    Period  Weeks    Status  Achieved        PT Long Term Goals - 06/27/18 1209      PT LONG TERM GOAL #1   Title  Patient to be independnet with advanced HEP.    Time  4    Period  Weeks    Status  Partially Met      PT LONG TERM GOAL #2   Title  Patient to demonstrate [facility] and pain-free lumbar AROM.    Time  4    Period  Weeks    Status  Partially Met      PT LONG TERM GOAL #3   Title  Patient to demonstrate B LE & UE strength >=4+/5.     Time  4    Period  Weeks     Status  Partially Met      PT LONG TERM GOAL #4   Title  Patient to report 50% improvement in pain levels with supine<>sit transfers.     Time  4    Period  Weeks    Status  Partially Met   06/11/18: Pt. reporting 50% impromement "most of the time".     PT LONG TERM GOAL #5   Title  Patient to report 50% improvement in cervical pain with ADLs.    Time  4    Period  Weeks    Status  On-going   06/11/18: 25% improvement           Plan - 06/27/18 1155    Clinical Impression Statement  Pt. reporting she is performing HEP.  Notes ~ 60% improvement in back and hip pain since starting therapy.  Session focusing on gentle scapular strengthening and cervical AROM activities with pt. tolerating well without increased pain.  Updated HEP with additional UT stretch as pt. demonstrating good improvement in cervical AROM today however still most limited in L side bending AROM.  Ended visit with red TB issued to pt. for updated HEP and pt. noting she was pain free.      Rehab Potential  Good    Clinical Impairments Affecting Rehab Potential  MI 2019, DM, vascular dementia with behavior disturbance, CAD, GERD, HLD, hypokalemia, lumbosacral DDD, CKD III, HTN, hx of uterine CA    PT Treatment/Interventions  ADLs/Self Care Home Management;Cryotherapy;Electrical Stimulation;Functional mobility training;Stair training;Gait training;DME Instruction;Ultrasound;Traction;Moist Heat;Therapeutic activities;Therapeutic exercise;Balance training;Neuromuscular re-education;Patient/family education;Passive range of motion;Manual techniques;Dry needling;Energy conservation;Splinting;Taping    PT Next Visit Plan  Progress periscapular and hip strength    Consulted and Agree with Plan of Care  Patient       Patient will benefit from skilled therapeutic intervention in order to improve the following deficits and impairments:  Decreased range of motion, Difficulty walking, Increased muscle spasms,  Decreased activity  tolerance, Pain, Decreased balance, Impaired flexibility, Improper body mechanics, Postural dysfunction, Decreased strength  Visit Diagnosis: Chronic bilateral low back pain without sciatica  Cervicalgia  Other symptoms and signs involving the musculoskeletal system  Muscle weakness (generalized)  Abnormal posture     Problem List Patient Active Problem List   Diagnosis Date Noted  . Varicose veins of right lower extremity with complications 03/47/4259  . URI (upper respiratory infection) 05/08/2018  . Vascular dementia with behavior disturbance ([place]) 02/24/2018  . Myalgia 02/09/2018  . Seasonal allergic rhinitis due to pollen 01/21/2018  . Cough 12/23/2017  . Allergic urticaria 12/23/2017  . History of myocardial infarction in last year 12/23/2017  . Diabetes mellitus ([place]) 12/23/2017  . Rash and nonspecific skin eruption 12/23/2017  . Intrinsic atopic dermatitis 12/23/2017  . CAD (coronary artery disease) 09/25/2017  . Low back pain 09/25/2017  . Gastroesophageal reflux disease with esophagitis 09/04/2017  . Hyperlipidemia associated with type 2 diabetes mellitus ([place]) 12/31/2016  . Hypokalemia 10/28/2015  . DDD (degenerative disc disease), lumbosacral 09/13/2015  . Diverticulosis of large intestine 09/13/2015  . Seborrheic dermatitis of scalp 09/13/2015  . Chronic kidney disease, stage III (moderate) ([place]) 05/02/2015  . Type 2 diabetes mellitus without complication, without long-term current use of insulin ([place]) 01/18/2015  . Allergic rhinitis 07/14/2014  . Essential hypertension 07/14/2014  . History of uterine cancer 07/14/2014    Bess Harvest, PTA 06/27/18 12:14 PM    [facility] [address]  [place], [place], [address] Phone: [phone]   Fax:  [phone]  Name: Kristina Lee MRN: [id number] Date of Birth: 05-29-1945

## 2018-07-01 ENCOUNTER — Ambulatory Visit: Payer: Medicare Other

## 2018-07-01 ENCOUNTER — Other Ambulatory Visit: Payer: Self-pay

## 2018-07-01 DIAGNOSIS — G8929 Other chronic pain: Secondary | ICD-10-CM

## 2018-07-01 DIAGNOSIS — R29898 Other symptoms and signs involving the musculoskeletal system: Secondary | ICD-10-CM

## 2018-07-01 DIAGNOSIS — M545 Low back pain: Secondary | ICD-10-CM | POA: Diagnosis not present

## 2018-07-01 DIAGNOSIS — M542 Cervicalgia: Secondary | ICD-10-CM

## 2018-07-01 DIAGNOSIS — M6281 Muscle weakness (generalized): Secondary | ICD-10-CM

## 2018-07-01 DIAGNOSIS — R293 Abnormal posture: Secondary | ICD-10-CM

## 2018-07-01 NOTE — Therapy (Signed)
[facility] [address]  [place], [place], [address] Phone: [phone]   Fax:  [phone]  Physical Therapy Treatment  Patient Details  Name: Kristina Lee MRN: [id number] Date of Birth: 02-22-1946 Referring Provider (PT): Karlton Lemon, MD   Encounter Date: 07/01/2018  PT End of Session - 07/01/18 1105    Visit Number  13    Number of Visits  18    Date for PT Re-Evaluation  07/09/18    Authorization Type  UHC Medicare    PT Start Time  1101    PT Stop Time  1150    PT Time Calculation (min)  49 min    Activity Tolerance  Patient tolerated treatment well    Behavior During Therapy  [facility] for tasks assessed/performed       Past Medical History:  Diagnosis Date  . Diabetes mellitus without complication ([place])   . Heart attack ([place]) 09/07/2017  . Urticaria     Past Surgical History:  Procedure Laterality Date  . ABDOMINAL HYSTERECTOMY  2006   b/l SPO and TAH, uterine cancer  . EYE SURGERY Bilateral 1960  . TUBAL LIGATION      There were no vitals filed for this visit.  Subjective Assessment - 07/01/18 1108    Subjective  Pt. noting ~ 50% improvement in neck pain since starting therapy.      Pertinent History  MI 2019, DM, vascular dementia with behavior disturbance, CAD, GERD, HLD, hypokalemia, lumbosacral DDD, CKD III, HTN, hx of uterine CA    Diagnostic tests  03/12/18 lumbar xray: There is no acute bony abnormality of the lumbar spine. Mild chronic changes as described.    Currently in Pain?  No/denies    Pain Score  0-No pain    Pain Location  Neck    Pain Orientation  Right    Pain Descriptors / Indicators  Sharp    Pain Type  Chronic pain    Multiple Pain Sites  No    Pain Score  0   up to 7/10 constant pain in R buttocks and down L LE    Pain Location  Buttocks    Pain Orientation  Right    Pain Descriptors / Indicators  Aching    Pain Type  Chronic pain    Pain Radiating Towards   into R LE                       [facility] Adult PT Treatment/Exercise - 07/01/18 1130      Self-Care   Self-Care  Other Self-Care Comments    Other Self-Care Comments   Review of self-ball release on wall to glutes for home performance with tennis ball       Lumbar Exercises: Stretches   Passive Hamstring Stretch  Right;2 reps;30 seconds    Passive Hamstring Stretch Limitations  strap     Single Knee to Chest Stretch  Right;1 rep;30 seconds    Single Knee to Chest Stretch Limitations  R only    Lower Trunk Rotation  --   5" x 10 rpes    Piriformis Stretch  Right;2 reps;30 seconds    Piriformis Stretch Limitations  KTOS       Lumbar Exercises: Aerobic   Recumbent Bike  Lvl 2, 7 min       Lumbar Exercises: Supine   Clam  10 reps;3 seconds    Clam Limitations  with yellow  TB at knees alternating clam shell       Lumbar Exercises: Sidelying   Clam  Right;Left;10 reps    Clam Limitations  yellow TB       Manual Therapy   Manual Therapy  Soft tissue mobilization;Myofascial release    Manual therapy comments  sidelying with R LE resting on bolster     Soft tissue mobilization  STM to R buttocks/piriformis; good reduction in tenderness noted     Myofascial Release  TPR to R piriformis; palpable reduction in tone noted following              PT Education - 07/01/18 1200    Education Details  HEP update    Person(s) Educated  Patient    Methods  Explanation;Demonstration;Verbal cues;Handout    Comprehension  Verbalized understanding;Returned demonstration;Verbal cues required;Need further instruction       PT Short Term Goals - 05/22/18 1504      PT SHORT TERM GOAL #1   Title  Patient to be independent with initial HEP.    Time  4    Period  Weeks    Status  Achieved        PT Long Term Goals - 07/01/18 1106      PT LONG TERM GOAL #1   Title  Patient to be independnet with advanced HEP.    Time  4    Period  Weeks    Status  Partially Met       PT LONG TERM GOAL #2   Title  Patient to demonstrate [facility] and pain-free lumbar AROM.    Time  4    Period  Weeks    Status  Partially Met      PT LONG TERM GOAL #3   Title  Patient to demonstrate B LE & UE strength >=4+/5.     Time  4    Period  Weeks    Status  Partially Met      PT LONG TERM GOAL #4   Title  Patient to report 50% improvement in pain levels with supine<>sit transfers.     Time  4    Period  Weeks    Status  Partially Met   06/11/18: Pt. reporting 50% impromement "most of the time".     PT LONG TERM GOAL #5   Title  Patient to report 50% improvement in cervical pain with ADLs.    Time  4    Period  Weeks    Status  Achieved   07/01/18: noted 50% improvement in cervical pain            Plan - 07/01/18 1107    Clinical Impression Statement  Pt. reporting she is only getting to "some of the exercises" at home 3x/wk.  Pt. encouraged to perform full HEP daily for full benefit form therapy.  With complaint of R buttocks and R LE pain after last session x three days which was constant in nature and worse while sitting.  Manual therapy addressing increased tone and palpable TP in R buttocks/piriformis which responded well to manual therapy with decreased tenderness following.  Reviewed self-massage to buttocks on wall with pt. using tennis ball for home performance with pt. verbizing/demonstrating understanding.  Ended visit with pt. reporting pain free.      Rehab Potential  Good    Clinical Impairments Affecting Rehab Potential  MI 2019, DM, vascular dementia with behavior disturbance, CAD, GERD, HLD, hypokalemia, lumbosacral DDD, CKD III,  HTN, hx of uterine CA    PT Treatment/Interventions  ADLs/Self Care Home Management;Cryotherapy;Electrical Stimulation;Functional mobility training;Stair training;Gait training;DME Instruction;Ultrasound;Traction;Moist Heat;Therapeutic activities;Therapeutic exercise;Balance training;Neuromuscular re-education;Patient/family  education;Passive range of motion;Manual techniques;Dry needling;Energy conservation;Splinting;Taping    PT Next Visit Plan  Progress periscapular and hip strength    Consulted and Agree with Plan of Care  Patient       Patient will benefit from skilled therapeutic intervention in order to improve the following deficits and impairments:  Decreased range of motion, Difficulty walking, Increased muscle spasms, Decreased activity tolerance, Pain, Decreased balance, Impaired flexibility, Improper body mechanics, Postural dysfunction, Decreased strength  Visit Diagnosis: Chronic bilateral low back pain without sciatica  Cervicalgia  Other symptoms and signs involving the musculoskeletal system  Muscle weakness (generalized)  Abnormal posture     Problem List Patient Active Problem List   Diagnosis Date Noted  . Varicose veins of right lower extremity with complications 75/43/6067  . URI (upper respiratory infection) 05/08/2018  . Vascular dementia with behavior disturbance ([place]) 02/24/2018  . Myalgia 02/09/2018  . Seasonal allergic rhinitis due to pollen 01/21/2018  . Cough 12/23/2017  . Allergic urticaria 12/23/2017  . History of myocardial infarction in last year 12/23/2017  . Diabetes mellitus ([place]) 12/23/2017  . Rash and nonspecific skin eruption 12/23/2017  . Intrinsic atopic dermatitis 12/23/2017  . CAD (coronary artery disease) 09/25/2017  . Low back pain 09/25/2017  . Gastroesophageal reflux disease with esophagitis 09/04/2017  . Hyperlipidemia associated with type 2 diabetes mellitus ([place]) 12/31/2016  . Hypokalemia 10/28/2015  . DDD (degenerative disc disease), lumbosacral 09/13/2015  . Diverticulosis of large intestine 09/13/2015  . Seborrheic dermatitis of scalp 09/13/2015  . Chronic kidney disease, stage III (moderate) ([place]) 05/02/2015  . Type 2 diabetes mellitus without complication, without long-term current use of insulin ([place]) 01/18/2015  . Allergic rhinitis  07/14/2014  . Essential hypertension 07/14/2014  . History of uterine cancer 07/14/2014    Bess Harvest, PTA 07/01/18 12:12 PM   [facility] [address]  [place], [place], [address] Phone: [phone]   Fax:  [phone]  Name: Kristina Lee MRN: [id number] Date of Birth: 03-14-1946

## 2018-07-04 ENCOUNTER — Ambulatory Visit: Payer: Medicare Other | Admitting: Physical Therapy

## 2018-07-08 ENCOUNTER — Ambulatory Visit: Payer: Medicare Other

## 2018-07-09 ENCOUNTER — Other Ambulatory Visit: Payer: Self-pay

## 2018-07-09 ENCOUNTER — Ambulatory Visit (INDEPENDENT_AMBULATORY_CARE_PROVIDER_SITE_OTHER): Payer: Medicare Other | Admitting: Family Medicine

## 2018-07-09 ENCOUNTER — Encounter: Payer: Self-pay | Admitting: Family Medicine

## 2018-07-09 VITALS — BP 162/115 | HR 73 | Temp 97.7°F | Ht 62.0 in | Wt 129.0 lb

## 2018-07-09 DIAGNOSIS — M545 Low back pain, unspecified: Secondary | ICD-10-CM

## 2018-07-09 NOTE — Patient Instructions (Addendum)
Continue your home exercises but add the 2-3 for your low back also. Tylenol, voltaren gel as needed. Follow up with me as needed. Keep in contact with Dr. Abner Greenspan about your blood pressure but the last couple visits outside of ours have been better (140s/80)

## 2018-07-09 NOTE — Progress Notes (Signed)
PCP: Bradd Canary, MD  Subjective:   HPI: Patient is a 73 y.o. female here for low back, hip pain.  1/2: Patient here with her husband. They report she's had problems with her low back dating to her birthday in May 2017. She woke up with fairly severe pain. Sought care and told she had 4 degenerative discs. Recalls seeing ortho and doing physical therapy, had good success and pain resolved. Similar issue in 2018, did well with physical therapy. Current pain may have started early in 2019. She had an episode in here of severe pain posterior bilateral back more to right side radiating into her knees. Couldn't bear weight for about 2 weeks. No bowel/bladder dysfunction. No skin changes, numbness. Pain level up to 6/10 on right, 1/10 on left.  2/14: Patient reports she's doing better. Taking tylenol with voltaren gel at times. Doing physical therapy and home exercises regularly. Pain down to 1/10 level at worst. No bowel/bladder dysfunction.  3/25: Patient reports she's doing much better. Had a few days of pain over the weekend but none currently. States in morning she will get pain across low back. No radiation down legs. Takes tylenol and voltaren gel sometimes. No skin changes.  Past Medical History:  Diagnosis Date  . Diabetes mellitus without complication ([facility])   . Heart attack ([facility]) 09/07/2017  . Urticaria     Current Outpatient Medications on File Prior to Visit  Medication Sig Dispense Refill  . aspirin EC 81 MG tablet Take 81 mg by mouth daily.    Marland Kitchen atorvastatin (LIPITOR) 80 MG tablet Take 1 tablet (80 mg total) by mouth daily. 90 tablet 1  . Calcium Carbonate-Vit D-Min (CALCIUM 600+D PLUS MINERALS) 600-400 MG-UNIT TABS Take 1 tablet by mouth daily.     . diclofenac sodium (VOLTAREN) 1 % GEL APPLY 4 G TOPICALLY 4 (FOUR) TIMES DAILY AS NEEDED FOR PAIN 500 g 1  . Diphenhydramine Cit-Pseudoeph (BENADRYL ALLERGY/SINUS FASTMLT PO) Take 1 tablet by mouth daily.     Marland Kitchen doxycycline (VIBRA-TABS) 100 MG tablet Take 1 tablet (100 mg total) by mouth 2 (two) times daily. 20 tablet 0  . famotidine (PEPCID) 20 MG tablet Take 1 tablet (20 mg total) by mouth 2 (two) times daily. 60 tablet 5  . fexofenadine (ALLEGRA) 180 MG tablet Take 180 mg by mouth 2 (two) times daily.     Marland Kitchen lisinopril (PRINIVIL,ZESTRIL) 2.5 MG tablet Take 1 tablet (2.5 mg total) by mouth daily. 90 tablet 1  . ticagrelor (BRILINTA) 90 MG TABS tablet Take 1 tablet (90 mg total) by mouth 2 (two) times daily. 180 tablet 1   No current facility-administered medications on file prior to visit.     Past Surgical History:  Procedure Laterality Date  . ABDOMINAL HYSTERECTOMY  2006   b/l SPO and TAH, uterine cancer  . EYE SURGERY Bilateral 1960  . TUBAL LIGATION      Allergies  Allergen Reactions  . Amoxicillin Anaphylaxis  . Hydroxyzine Itching and Other (See Comments)    Other reaction(s): Other (See Comments) Sedation Sedation Sedation   . Sulfa Antibiotics Swelling    Other reaction(s): Swelling tongue tongue tongue     Social History   Socioeconomic History  . Marital status: Married    Spouse name: Not on file  . Number of children: Not on file  . Years of education: Not on file  . Highest education level: Not on file  Occupational History  . Not on file  Social  Needs  . Financial resource strain: Not on file  . Food insecurity:    Worry: Not on file    Inability: Not on file  . Transportation needs:    Medical: Not on file    Non-medical: Not on file  Tobacco Use  . Smoking status: Former Smoker    Packs/day: 0.50    Years: 24.00    Pack years: 12.00    Types: Cigarettes    Last attempt to quit: 04/17/1987    Years since quitting: 31.2  . Smokeless tobacco: Never Used  Substance and Sexual Activity  . Alcohol use: Not Currently  . Drug use: Not Currently  . Sexual activity: Yes  Lifestyle  . Physical activity:    Days per week: Not on file    Minutes per  session: Not on file  . Stress: Not on file  Relationships  . Social connections:    Talks on phone: Not on file    Gets together: Not on file    Attends religious service: Not on file    Active member of club or organization: Not on file    Attends meetings of clubs or organizations: Not on file    Relationship status: Not on file  . Intimate partner violence:    Fear of current or ex partner: Not on file    Emotionally abused: Not on file    Physically abused: Not on file    Forced sexual activity: Not on file  Other Topics Concern  . Not on file  Social History Narrative  . Not on file    Family History  Problem Relation Age of Onset  . Cancer Mother        liver cancer, breast cancer x 2   . Alcohol abuse Mother        smoker  . Heart disease Father        MI  . Alcohol abuse Father        smoker  . Depression Sister   . Tremor Sister   . Cancer Brother        pancreatic  . Diabetes Maternal Grandmother   . Hearing loss Maternal Grandmother   . Depression Maternal Grandmother   . Allergic rhinitis Neg Hx   . Angioedema Neg Hx   . Asthma Neg Hx   . Eczema Neg Hx   . Immunodeficiency Neg Hx   . Urticaria Neg Hx     BP (!) 162/115   Pulse 73   Temp 97.7 F (36.5 C) (Oral)   Ht 5\' 2"  (1.575 m)   Wt 129 lb (58.5 kg)   BMI 23.59 kg/m   Review of Systems: See HPI above.     Objective:  Physical Exam:  Gen: NAD, comfortable in exam room  Back: No gross deformity, scoliosis. No TTP paraspinal regions.  No midline or bony TTP. FROM without pain. Strength LEs 5/5 all muscle groups.   2+ MSRs in patellar and achilles tendons, equal bilaterally. Negative SLRs. Sensation intact to light touch bilaterally.  Bilateral hips: No deformity. FROM with 5/5 strength. No tenderness to palpation. NVI distally. Negative logroll bilateral hips Negative fabers and piriformis stretches.   Assessment & Plan:  1. Low back pain into both legs - much improved.   Encouraged to continue with home exercises.  Tylenol, voltaren gel as needed.  F/u prn.

## 2018-07-11 ENCOUNTER — Ambulatory Visit: Payer: Medicare Other | Admitting: Family Medicine

## 2018-07-11 ENCOUNTER — Encounter: Payer: Self-pay | Admitting: Physical Therapy

## 2018-07-11 ENCOUNTER — Ambulatory Visit: Payer: Medicare Other | Admitting: Physical Therapy

## 2018-07-11 ENCOUNTER — Other Ambulatory Visit: Payer: Self-pay

## 2018-07-11 ENCOUNTER — Encounter: Payer: Medicare Other | Admitting: Physical Therapy

## 2018-07-11 VITALS — BP 126/65 | HR 60

## 2018-07-11 DIAGNOSIS — M6281 Muscle weakness (generalized): Secondary | ICD-10-CM

## 2018-07-11 DIAGNOSIS — R29898 Other symptoms and signs involving the musculoskeletal system: Secondary | ICD-10-CM

## 2018-07-11 DIAGNOSIS — G8929 Other chronic pain: Secondary | ICD-10-CM

## 2018-07-11 DIAGNOSIS — M542 Cervicalgia: Secondary | ICD-10-CM

## 2018-07-11 DIAGNOSIS — R293 Abnormal posture: Secondary | ICD-10-CM

## 2018-07-11 DIAGNOSIS — M545 Low back pain, unspecified: Secondary | ICD-10-CM

## 2018-07-11 NOTE — Therapy (Signed)
[facility] [address]  [place], [place], [address] Phone: [phone]   Fax:  [phone]  Physical Therapy Treatment  Patient Details  Name: Kristina Lee MRN: [id number] Date of Birth: 30-Apr-1945 Referring Provider (PT): Karlton Lemon, MD   Encounter Date: 07/11/2018  PT End of Session - 07/11/18 1118    Visit Number  14    Number of Visits  18    Date for PT Re-Evaluation  08/08/18    Authorization Type  [employer] Medicare    PT Start Time  1013    PT Stop Time  1104    PT Time Calculation (min)  51 min    Activity Tolerance  Patient tolerated treatment well    Behavior During Therapy  [facility] for tasks assessed/performed       Past Medical History:  Diagnosis Date  . Diabetes mellitus without complication ([place])   . Heart attack ([place]) 09/07/2017  . Urticaria     Past Surgical History:  Procedure Laterality Date  . ABDOMINAL HYSTERECTOMY  2006   b/l SPO and TAH, uterine cancer  . EYE SURGERY Bilateral 1960  . TUBAL LIGATION      Vitals:   07/11/18 1016  BP: 126/65  Pulse: 60  SpO2: 98%    Subjective Assessment - 07/11/18 1014    Subjective  Reports that her pain has "gotten to a point when it is what it is." Saw MD who was worried about her high BP again, but she has an appointment with her PCP next month. Reports that she has had painful swollen gland under her chin but no other symptoms. Still has tightness in R buttock with sitting and driving. Also still having neck pain. LBP worse in the morning.     Pertinent History  MI 2019, DM, vascular dementia with behavior disturbance, CAD, GERD, HLD, hypokalemia, lumbosacral DDD, CKD III, HTN, hx of uterine CA    Diagnostic tests  03/12/18 lumbar xray: There is no acute bony abnormality of the lumbar spine. Mild chronic changes as described.         [facility] PT Assessment - 07/11/18 0001      Assessment   Medical Diagnosis  R acute LBP, R ITB syndrome     Referring Provider (PT)  Karlton Lemon, MD    Onset Date/Surgical Date  04/29/16      AROM   Cervical Flexion  57    Cervical Extension  30    Cervical - Right Side Bend  35   mild pain in R UT   Cervical - Left Side Bend  34   mild pain in L UT   Cervical - Right Rotation  49    Cervical - Left Rotation  50    Lumbar Flexion  toes    Lumbar Extension  moderately limited    Lumbar - Right Side Bend  distal thigh   mild pain in R side   Lumbar - Left Side Bend  distal thigh    Lumbar - Right Rotation  milldly limited    Lumbar - Left Rotation  [facility]      Strength   Right Shoulder Flexion  4+/5    Right Shoulder ABduction  4+/5    Right Shoulder Internal Rotation  4/5    Right Shoulder External Rotation  4/5    Left Shoulder Flexion  4+/5    Left Shoulder ABduction  4+/5    Left Shoulder  Internal Rotation  4-/5   pain in shoulder   Left Shoulder External Rotation  4/5    Right Hip Flexion  4+/5    Right Hip ABduction  4+/5    Right Hip ADduction  4/5    Left Hip Flexion  4+/5    Left Hip ABduction  4+/5    Left Hip ADduction  4/5                   [facility] Adult PT Treatment/Exercise - 07/11/18 0001      Neck Exercises: Seated   Neck Retraction  10 reps;3 secs    Neck Retraction Limitations  good form but limited range      Lumbar Exercises: Aerobic   Nustep  Lvl 3, 6 min LEs only      Shoulder Exercises: Standing   Horizontal ABduction  Strengthening;Both;10 reps;Theraband    Theraband Level (Shoulder Horizontal ABduction)  Level 1 (Yellow)    Horizontal ABduction Limitations  in sitting; heavy cues for scapular retraction and maintaining elbows straight   unable to tolerate red TB d/t LBP   Row  Strengthening;Both;15 reps;Theraband    Theraband Level (Shoulder Row)  Level 2 (Red)    Row Limitations  cues for scapular retraction      Neck Exercises: Stretches   Upper Trapezius Stretch  Right;Left;30 seconds;2 reps    Upper Trapezius Stretch  Limitations  with strap to tolerance   2nd set sitting on hands            PT Education - 07/11/18 1116    Education Details  advised patient to follow up with PCP about swollen lymph nodes, educated on proper posture and body mechanics with reading, update and review of HEP for neck     Person(s) Educated  Patient    Methods  Explanation;Demonstration;Tactile cues;Verbal cues;Handout    Comprehension  Verbalized understanding;Returned demonstration       PT Short Term Goals - 07/11/18 1031      PT SHORT TERM GOAL #1   Title  Patient to be independent with initial HEP.    Time  4    Period  Weeks    Status  Achieved        PT Long Term Goals - 07/11/18 1031      PT LONG TERM GOAL #1   Title  Patient to be independnet with advanced HEP.    Time  4    Period  Weeks    Status  Partially Met   met for current   Target Date  08/08/18      PT LONG TERM GOAL #2   Title  Patient to demonstrate [facility] and pain-free lumbar AROM.    Time  4    Period  Weeks    Status  Partially Met   improvement in lumbar flexion, extension, R & L sidebending pain levels and R rotation AROM   Target Date  08/08/18      PT LONG TERM GOAL #3   Title  Patient to demonstrate B LE & UE strength >=4+/5.     Time  4    Period  Weeks    Status  Partially Met   decreased strength in B shoulder IR and ER and B hip adduction strength   Target Date  08/08/18      PT LONG TERM GOAL #4   Title  Patient to report 50% improvement in pain levels with supine<>sit transfers.  Time  4    Period  Weeks    Status  Achieved   reports 50% or more improvement     PT LONG TERM GOAL #5   Title  Patient to report 50% improvement in cervical pain with ADLs.    Time  4    Period  Weeks    Status  Partially Met   reports "near 50%" improvement in cervical pain    Target Date  08/08/18            Plan - 07/11/18 1125    Clinical Impression Statement  Patient arrived to session with report of  improvement in LBP and neck pain since starting PT, however she still has days when she has severe pain. Notes that she would still like to work on tightness and pain in R buttock with sitting and driving, pain with sit to stand, LBP in morning, and neck pain. Also mentioned swollen lymph nodes in neck without other symptoms- advised her to discuss this with her PCP. Patient admits to long bouts of reading with neck stuck in flexion- educated patient on proper posture and body mechanics and suggested book stand. Patient reports nearly 50% improvement in neck pain and 50% improvement in LBP with STS since starting PT. Patient has shown improvement in lumbar flexion, extension, R & L sidebending pain levels and R rotation AROM. Strength testing revealed decreased strength in B shoulder IR and ER and B hip adduction. Focused today's session on gentle cervical stretching and periscapular strengthening. Reviewed and re-administered cervical HEP as patient reporting no recollection of previously administered exercises. Patient requiring increased time for education and cues with ther-ex d/t limited understanding. Ended session with no complaints. Patient would benefit from skilled PT services 1x/week for 4 weeks to address aforementioned impairments.     Clinical Impairments Affecting Rehab Potential  MI 2019, DM, vascular dementia with behavior disturbance, CAD, GERD, HLD, hypokalemia, lumbosacral DDD, CKD III, HTN, hx of uterine CA    PT Frequency  1x / week    PT Duration  4 weeks    PT Treatment/Interventions  ADLs/Self Care Home Management;Cryotherapy;Electrical Stimulation;Functional mobility training;Stair training;Gait training;DME Instruction;Ultrasound;Traction;Moist Heat;Therapeutic activities;Therapeutic exercise;Balance training;Neuromuscular re-education;Patient/family education;Passive range of motion;Manual techniques;Dry needling;Energy conservation;Splinting;Taping    PT Next Visit Plan  Progress  periscapular and hip strength    Consulted and Agree with Plan of Care  Patient       Patient will benefit from skilled therapeutic intervention in order to improve the following deficits and impairments:  Decreased range of motion, Difficulty walking, Increased muscle spasms, Decreased activity tolerance, Pain, Decreased balance, Impaired flexibility, Improper body mechanics, Postural dysfunction, Decreased strength  Visit Diagnosis: Chronic bilateral low back pain without sciatica  Cervicalgia  Other symptoms and signs involving the musculoskeletal system  Muscle weakness (generalized)  Abnormal posture     Problem List Patient Active Problem List   Diagnosis Date Noted  . Varicose veins of right lower extremity with complications 56/81/2751  . URI (upper respiratory infection) 05/08/2018  . Vascular dementia with behavior disturbance ([place]) 02/24/2018  . Myalgia 02/09/2018  . Seasonal allergic rhinitis due to pollen 01/21/2018  . Cough 12/23/2017  . Allergic urticaria 12/23/2017  . History of myocardial infarction in last year 12/23/2017  . Diabetes mellitus ([place]) 12/23/2017  . Rash and nonspecific skin eruption 12/23/2017  . Intrinsic atopic dermatitis 12/23/2017  . CAD (coronary artery disease) 09/25/2017  . Low back pain 09/25/2017  . Gastroesophageal reflux disease with esophagitis  09/04/2017  . Hyperlipidemia associated with type 2 diabetes mellitus ([place]) 12/31/2016  . Hypokalemia 10/28/2015  . DDD (degenerative disc disease), lumbosacral 09/13/2015  . Diverticulosis of large intestine 09/13/2015  . Seborrheic dermatitis of scalp 09/13/2015  . Chronic kidney disease, stage III (moderate) ([place]) 05/02/2015  . Type 2 diabetes mellitus without complication, without long-term current use of insulin ([place]) 01/18/2015  . Allergic rhinitis 07/14/2014  . Essential hypertension 07/14/2014  . History of uterine cancer 07/14/2014     Janene Harvey, PT, DPT 07/11/18  11:32 AM   [facility] [address]  [place], [place], [address] Phone: [phone]   Fax:  [phone]  Name: Kristina Lee MRN: [id number] Date of Birth: September 19, 1945

## 2018-07-15 ENCOUNTER — Other Ambulatory Visit: Payer: Self-pay | Admitting: *Deleted

## 2018-07-15 MED ORDER — FAMOTIDINE 20 MG PO TABS: 20.0000 mg | ORAL_TABLET | Freq: Two times a day (BID) | ORAL | 0 refills | Status: DC

## 2018-07-18 ENCOUNTER — Other Ambulatory Visit: Payer: Self-pay

## 2018-07-18 ENCOUNTER — Ambulatory Visit: Payer: Medicare Other | Attending: Family Medicine | Admitting: Physical Therapy

## 2018-07-18 ENCOUNTER — Encounter: Payer: Self-pay | Admitting: Physical Therapy

## 2018-07-18 VITALS — BP 150/70 | HR 52

## 2018-07-18 DIAGNOSIS — R29898 Other symptoms and signs involving the musculoskeletal system: Secondary | ICD-10-CM | POA: Diagnosis present

## 2018-07-18 DIAGNOSIS — M6281 Muscle weakness (generalized): Secondary | ICD-10-CM

## 2018-07-18 DIAGNOSIS — R293 Abnormal posture: Secondary | ICD-10-CM | POA: Diagnosis present

## 2018-07-18 DIAGNOSIS — M545 Low back pain: Secondary | ICD-10-CM | POA: Diagnosis not present

## 2018-07-18 DIAGNOSIS — M542 Cervicalgia: Secondary | ICD-10-CM | POA: Insufficient documentation

## 2018-07-18 DIAGNOSIS — G8929 Other chronic pain: Secondary | ICD-10-CM | POA: Insufficient documentation

## 2018-07-18 NOTE — Therapy (Signed)
[facility] [address]  [place], [place], [address] Phone: [phone]   Fax:  [phone]  Physical Therapy Treatment  Patient Details  Name: Kristina Lee MRN: [id number] Date of Birth: 25-May-1945 Referring Provider (PT): Karlton Lemon, MD   Encounter Date: 07/18/2018  PT End of Session - 07/18/18 1101    Visit Number  15    Number of Visits  18    Date for PT Re-Evaluation  08/08/18    Authorization Type  UHC Medicare    PT Start Time  1019    PT Stop Time  1059    PT Time Calculation (min)  40 min    Activity Tolerance  Patient tolerated treatment well    Behavior During Therapy  [facility] for tasks assessed/performed       Past Medical History:  Diagnosis Date  . Diabetes mellitus without complication ([place])   . Heart attack ([place]) 09/07/2017  . Urticaria     Past Surgical History:  Procedure Laterality Date  . ABDOMINAL HYSTERECTOMY  2006   b/l SPO and TAH, uterine cancer  . EYE SURGERY Bilateral 1960  . TUBAL LIGATION      Vitals:   07/18/18 1020  BP: (!) 150/70  Pulse: (!) 52  SpO2: 100%    Subjective Assessment - 07/18/18 1023    Subjective  Reports that she has not been sleeping well and her son and his family just moved out of her house. Has been worried about them and all the COVID-19 things in the media.     Pertinent History  MI 2019, DM, vascular dementia with behavior disturbance, CAD, GERD, HLD, hypokalemia, lumbosacral DDD, CKD III, HTN, hx of uterine CA    Diagnostic tests  03/12/18 lumbar xray: There is no acute bony abnormality of the lumbar spine. Mild chronic changes as described.    Currently in Pain?  Yes    Pain Score  1     Pain Location  Neck    Pain Orientation  Right    Pain Descriptors / Indicators  Sharp    Pain Type  Chronic pain                       [facility] Adult PT Treatment/Exercise - 07/18/18 0001      Lumbar Exercises: Stretches   Piriformis  Stretch  Right;2 reps;30 seconds;Left    Piriformis Stretch Limitations  KTOS       Lumbar Exercises: Aerobic   Nustep  Lvl 3, 6 min UE/LEs      Lumbar Exercises: Sidelying   Clam  Right;Left;15 reps    Clam Limitations  correction of trunk rotation required    Other Sidelying Lumbar Exercises  R & L open book stretch x10 to tolerance   heavy cues for form and avoiding merely shoulder ROM     Lumbar Exercises: Quadruped   Madcat/Old Horse  10 reps    Madcat/Old Horse Limitations  heavy manual cues to promote ant/pos pelvic tilting      Shoulder Exercises: Supine   Horizontal ABduction  Strengthening;Both;10 reps;Theraband    Theraband Level (Shoulder Horizontal ABduction)  Level 1 (Yellow)    Horizontal ABduction Weight (lbs)  supine on noodle    External Rotation  Strengthening;Both;10 reps;Theraband    Theraband Level (Shoulder External Rotation)  Level 1 (Yellow)    External Rotation Limitations  supine on noodle   heavy cues for form and  core contraction   Diagonals  Strengthening;Right;Left;10 reps;Theraband    Theraband Level (Shoulder Diagonals)  Level 1 (Yellow)    Diagonals Limitations  supine on noodle   heavy cues for form and trouble balancing              PT Short Term Goals - 07/11/18 1031      PT SHORT TERM GOAL #1   Title  Patient to be independent with initial HEP.    Time  4    Period  Weeks    Status  Achieved        PT Long Term Goals - 07/11/18 1031      PT LONG TERM GOAL #1   Title  Patient to be independnet with advanced HEP.    Time  4    Period  Weeks    Status  Partially Met   met for current   Target Date  08/08/18      PT LONG TERM GOAL #2   Title  Patient to demonstrate [facility] and pain-free lumbar AROM.    Time  4    Period  Weeks    Status  Partially Met   improvement in lumbar flexion, extension, R & L sidebending pain levels and R rotation AROM   Target Date  08/08/18      PT LONG TERM GOAL #3   Title  Patient to  demonstrate B LE & UE strength >=4+/5.     Time  4    Period  Weeks    Status  Partially Met   decreased strength in B shoulder IR and ER and B hip adduction strength   Target Date  08/08/18      PT LONG TERM GOAL #4   Title  Patient to report 50% improvement in pain levels with supine<>sit transfers.     Time  4    Period  Weeks    Status  Achieved   reports 50% or more improvement     PT LONG TERM GOAL #5   Title  Patient to report 50% improvement in cervical pain with ADLs.    Time  4    Period  Weeks    Status  Partially Met   reports "near 50%" improvement in cervical pain    Target Date  08/08/18            Plan - 07/18/18 1101    Clinical Impression Statement  Patient arrived to session with report of increased neck, LB, and hip pain over the weekend, however today reporting decreased pain levels. Worked on periscapular and core strengthening while supine on pool noodle. Patient with considerable difficulty maintaining supine balance despite being given cues to contract core. However, was still able to complete ther-ex activities. Patient requiring consistent manual cues and reminders on previously performed exercises, causing poor carryover- likely d/t cognitive impairment. Able to perform cat/cow exercise with manual cues to promote anterior and posterior pelvic tilting- most limited in lumbar lordosis. Patient without pain at end of session.     Clinical Impairments Affecting Rehab Potential  MI 2019, DM, vascular dementia with behavior disturbance, CAD, GERD, HLD, hypokalemia, lumbosacral DDD, CKD III, HTN, hx of uterine CA    PT Treatment/Interventions  ADLs/Self Care Home Management;Cryotherapy;Electrical Stimulation;Functional mobility training;Stair training;Gait training;DME Instruction;Ultrasound;Traction;Moist Heat;Therapeutic activities;Therapeutic exercise;Balance training;Neuromuscular re-education;Patient/family education;Passive range of motion;Manual  techniques;Dry needling;Energy conservation;Splinting;Taping    PT Next Visit Plan  Progress periscapular and hip strength    Consulted and Agree  with Plan of Care  Patient       Patient will benefit from skilled therapeutic intervention in order to improve the following deficits and impairments:  Decreased range of motion, Difficulty walking, Increased muscle spasms, Decreased activity tolerance, Pain, Decreased balance, Impaired flexibility, Improper body mechanics, Postural dysfunction, Decreased strength  Visit Diagnosis: Chronic bilateral low back pain without sciatica  Cervicalgia  Other symptoms and signs involving the musculoskeletal system  Muscle weakness (generalized)  Abnormal posture     Problem List Patient Active Problem List   Diagnosis Date Noted  . Varicose veins of right lower extremity with complications 05/27/1550  . URI (upper respiratory infection) 05/08/2018  . Vascular dementia with behavior disturbance ([place]) 02/24/2018  . Myalgia 02/09/2018  . Seasonal allergic rhinitis due to pollen 01/21/2018  . Cough 12/23/2017  . Allergic urticaria 12/23/2017  . History of myocardial infarction in last year 12/23/2017  . Diabetes mellitus ([place]) 12/23/2017  . Rash and nonspecific skin eruption 12/23/2017  . Intrinsic atopic dermatitis 12/23/2017  . CAD (coronary artery disease) 09/25/2017  . Low back pain 09/25/2017  . Gastroesophageal reflux disease with esophagitis 09/04/2017  . Hyperlipidemia associated with type 2 diabetes mellitus ([place]) 12/31/2016  . Hypokalemia 10/28/2015  . DDD (degenerative disc disease), lumbosacral 09/13/2015  . Diverticulosis of large intestine 09/13/2015  . Seborrheic dermatitis of scalp 09/13/2015  . Chronic kidney disease, stage III (moderate) ([place]) 05/02/2015  . Type 2 diabetes mellitus without complication, without long-term current use of insulin ([place]) 01/18/2015  . Allergic rhinitis 07/14/2014  . Essential hypertension  07/14/2014  . History of uterine cancer 07/14/2014     Janene Harvey, PT, DPT 07/18/18 11:09 AM   [facility] [address]  [place], [place], [address] Phone: [phone]   Fax:  [phone]  Name: Kristina Lee MRN: [id number] Date of Birth: 07/19/1945

## 2018-07-21 ENCOUNTER — Encounter: Payer: Self-pay | Admitting: Physical Therapy

## 2018-07-21 ENCOUNTER — Ambulatory Visit: Payer: Medicare Other | Admitting: Physical Therapy

## 2018-07-21 ENCOUNTER — Other Ambulatory Visit: Payer: Self-pay

## 2018-07-21 VITALS — BP 124/79 | HR 63

## 2018-07-21 DIAGNOSIS — R29898 Other symptoms and signs involving the musculoskeletal system: Secondary | ICD-10-CM

## 2018-07-21 DIAGNOSIS — G8929 Other chronic pain: Secondary | ICD-10-CM

## 2018-07-21 DIAGNOSIS — M545 Low back pain: Secondary | ICD-10-CM | POA: Diagnosis not present

## 2018-07-21 DIAGNOSIS — M542 Cervicalgia: Secondary | ICD-10-CM

## 2018-07-21 DIAGNOSIS — M6281 Muscle weakness (generalized): Secondary | ICD-10-CM

## 2018-07-21 DIAGNOSIS — R293 Abnormal posture: Secondary | ICD-10-CM

## 2018-07-21 NOTE — Progress Notes (Addendum)
I connected with Lyola on 07/22/18 at  1:00 PM EDT by a video enabled telemedicine application and verified that I am speaking with the correct person using two identifiers.   Subjective:   Kristina Lee is a 73 y.o. female who presents for an Initial Medicare Annual Wellness Visit.  Review of Systems   No ROS.  Medicare Wellness Visit. Additional risk factors are reflected in the social history. Cardiac Risk Factors include: advanced age (>48men, >37 women);diabetes mellitus;dyslipidemia;hypertension Sleep patterns:  Home Safety/Smoke Alarms: Feels safe in home. Smoke alarms in place.  Lives in 2 story home. No issue with stairs.   Female:     Mammo- 10/28/17       Dexa scan-  declines    CCS- declines     Objective:    Unable to assess vitals. This visit is enabled though telemedicine due to Covid 19.   Advanced Directives 07/22/2018 04/29/2018 11/18/2017  Does Patient Have a Medical Advance Directive? Yes Yes No  Type of Estate agent of [place];Living will - -  Does patient want to make changes to medical advance directive? No - Guardian declined No - Patient declined -  Copy of Healthcare Power of Attorney in Chart? No - copy requested - -  Would patient like information on creating a medical advance directive? - - No - Patient declined    Current Medications (verified) Outpatient Encounter Medications as of 07/22/2018  Medication Sig  . aspirin EC 81 MG tablet Take 81 mg by mouth daily.  Marland Kitchen atorvastatin (LIPITOR) 80 MG tablet Take 1 tablet (80 mg total) by mouth daily.  . Calcium Carbonate-Vit D-Min (CALCIUM 600+D PLUS MINERALS) 600-400 MG-UNIT TABS Take 1 tablet by mouth daily.   . diclofenac sodium (VOLTAREN) 1 % GEL APPLY 4 G TOPICALLY 4 (FOUR) TIMES DAILY AS NEEDED FOR PAIN  . famotidine (PEPCID) 20 MG tablet Take 1 tablet (20 mg total) by mouth 2 (two) times daily.  . fexofenadine (ALLEGRA) 180 MG tablet Take 180 mg by mouth 2 (two) times  daily.   Marland Kitchen lisinopril (PRINIVIL,ZESTRIL) 2.5 MG tablet Take 1 tablet (2.5 mg total) by mouth daily.  . ticagrelor (BRILINTA) 90 MG TABS tablet Take 1 tablet (90 mg total) by mouth 2 (two) times daily.  . [DISCONTINUED] Diphenhydramine Cit-Pseudoeph (BENADRYL ALLERGY/SINUS FASTMLT PO) Take 1 tablet by mouth daily.  . [DISCONTINUED] doxycycline (VIBRA-TABS) 100 MG tablet Take 1 tablet (100 mg total) by mouth 2 (two) times daily.   No facility-administered encounter medications on file as of 07/22/2018.     Allergies (verified) Amoxicillin; Hydroxyzine; and Sulfa antibiotics   History: Past Medical History:  Diagnosis Date  . Diabetes mellitus without complication (HCC)   . Heart attack (HCC) 09/07/2017  . Urticaria    Past Surgical History:  Procedure Laterality Date  . ABDOMINAL HYSTERECTOMY  2006   b/l SPO and TAH, uterine cancer  . EYE SURGERY Bilateral 1960  . TUBAL LIGATION     Family History  Problem Relation Age of Onset  . Cancer Mother        liver cancer, breast cancer x 2   . Alcohol abuse Mother        smoker  . Heart disease Father        MI  . Alcohol abuse Father        smoker  . Depression Sister   . Tremor Sister   . Cancer Brother        pancreatic  .  Diabetes Maternal Grandmother   . Hearing loss Maternal Grandmother   . Depression Maternal Grandmother   . Allergic rhinitis Neg Hx   . Angioedema Neg Hx   . Asthma Neg Hx   . Eczema Neg Hx   . Immunodeficiency Neg Hx   . Urticaria Neg Hx    Social History   Socioeconomic History  . Marital status: Married    Spouse name: Not on file  . Number of children: Not on file  . Years of education: Not on file  . Highest education level: Not on file  Occupational History  . Not on file  Social Needs  . Financial resource strain: Not on file  . Food insecurity:    Worry: Not on file    Inability: Not on file  . Transportation needs:    Medical: Not on file    Non-medical: Not on file  Tobacco Use   . Smoking status: Former Smoker    Packs/day: 0.50    Years: 24.00    Pack years: 12.00    Types: Cigarettes    Last attempt to quit: 04/17/1987    Years since quitting: 31.2  . Smokeless tobacco: Never Used  Substance and Sexual Activity  . Alcohol use: Not Currently  . Drug use: Not Currently  . Sexual activity: Yes  Lifestyle  . Physical activity:    Days per week: Not on file    Minutes per session: Not on file  . Stress: Not on file  Relationships  . Social connections:    Talks on phone: Not on file    Gets together: Not on file    Attends religious service: Not on file    Active member of club or organization: Not on file    Attends meetings of clubs or organizations: Not on file    Relationship status: Not on file  Other Topics Concern  . Not on file  Social History Narrative  . Not on file    Tobacco Counseling Counseling given: Not Answered   Clinical Intake:  Pain Score: 0-No pain    Activities of Daily Living In your present state of health, do you have any difficulty performing the following activities: 07/22/2018  Hearing? N  Vision? N  Difficulty concentrating or making decisions? N  Walking or climbing stairs? N  Dressing or bathing? N  Doing errands, shopping? N  Preparing Food and eating ? N  Using the Toilet? N  In the past six months, have you accidently leaked urine? N  Do you have problems with loss of bowel control? N  Managing your Medications? N  Managing your Finances? N  Housekeeping or managing your Housekeeping? N     Immunizations and Health Maintenance Immunization History  Administered Date(s) Administered  . Influenza, High Dose Seasonal PF 01/18/2015, 02/02/2016, 01/17/2017, 12/31/2017  . Influenza,inj,Quad PF,6+ Mos 02/14/2014  . Influenza-Unspecified 12/31/2017  . Pneumococcal Conjugate-13 05/27/2013, 05/27/2013  . Pneumococcal Polysaccharide-23 04/16/2014, 04/16/2014  . Tdap 07/14/2014, 07/14/2014  . Zoster  04/17/2011, 04/17/2011   Health Maintenance Due  Topic Date Due  . Hepatitis C Screening  06/19/1945  . FOOT EXAM  09/07/1955  . OPHTHALMOLOGY EXAM  09/07/1955  . DEXA SCAN  09/07/2010    Patient Care Team: Bradd Canary, MD as PCP - General (Family Medicine)  Indicate any recent Medical Services you may have received from other than [facility] providers in the past year (date may be approximate).     Assessment:  This is a routine wellness examination for Noah.Physical assessment deferred to PCP.  Hearing/Vision screen Unable to assess. This visit is enabled though telemedicine due to Covid 19.   Dietary issues and exercise activities discussed: Current Exercise Habits: Home exercise routine, Type of exercise: stretching, Frequency (Times/Week): 3, Intensity: Mild, Exercise limited by: None identified Diet (meal preparation, eat out, water intake, caffeinated beverages, dairy products, fruits and vegetables): well balanced, on average, 3 meals per day      Goals    . Increase physical activity      Depression Screen PHQ 2/9 Scores 07/22/2018  PHQ - 2 Score 0    Fall Risk Fall Risk  07/22/2018  Falls in the past year? 0    Cognitive Function: Ad8 score reviewed for issues:  Issues making decisions:no  Less interest in hobbies / activities:no  Repeats questions, stories (family complaining):no  Trouble using ordinary gadgets (microwave, computer, phone):no  Forgets the month or year: no  Mismanaging finances: no  Remembering appts:no  Daily problems with thinking and/or memory:no Ad8 score is=0         Screening Tests Health Maintenance  Topic Date Due  . Hepatitis C Screening  02/16/1946  . FOOT EXAM  09/07/1955  . OPHTHALMOLOGY EXAM  09/07/1955  . DEXA SCAN  09/07/2010  . HEMOGLOBIN A1C  10/04/2018  . INFLUENZA VACCINE  11/15/2018  . MAMMOGRAM  10/29/2019  . TETANUS/TDAP  07/13/2024  . COLONOSCOPY  04/16/2026  . PNA vac Low Risk Adult  Completed      Plan:    Please schedule your next medicare wellness visit with me in 1 yr.  Continue to eat heart healthy diet (full of fruits, vegetables, whole grains, lean protein, water--limit salt, fat, and sugar intake) and increase physical activity as tolerated.  Continue doing brain stimulating activities (puzzles, reading, adult coloring books, staying active) to keep memory sharp.   Bring a copy of your living will and/or healthcare power of attorney to your next office visit.    I have personally reviewed and noted the following in the patient's chart:   . Medical and social history . Use of alcohol, tobacco or illicit drugs  . Current medications and supplements . Functional ability and status . Nutritional status . Physical activity . Advanced directives . List of other physicians . Hospitalizations, surgeries, and ER visits in previous 12 months . Vitals . Screenings to include cognitive, depression, and falls . Referrals and appointments  In addition, I have reviewed and discussed with patient certain preventive protocols, quality metrics, and best practice recommendations. A written personalized care plan for preventive services as well as general preventive health recommendations were provided to patient.     [place]on G[place], [place]   07/22/2018    Medical screening examination/treatment was performed by qualified clinical staff member and as supervising physician I was immediately available for consultation/collaboration. I have reviewed documentation and agree with assessment and plan.  Danise Edge, MD

## 2018-07-21 NOTE — Therapy (Signed)
[facility] [address]  [place], [place], [address] Phone: [phone]   Fax:  [phone]  Physical Therapy Treatment  Patient Details  Name: Kristina Lee MRN: [id number] Date of Birth: 1946/04/15 Referring Provider (PT): Karlton Lemon, MD   Encounter Date: 07/21/2018  PT End of Session - 07/21/18 1100    Visit Number  16    Number of Visits  18    Date for PT Re-Evaluation  08/08/18    Authorization Type  UHC Medicare    PT Start Time  1015    PT Stop Time  1056    PT Time Calculation (min)  41 min    Activity Tolerance  Patient tolerated treatment well    Behavior During Therapy  [facility] for tasks assessed/performed       Past Medical History:  Diagnosis Date  . Diabetes mellitus without complication ([place])   . Heart attack ([place]) 09/07/2017  . Urticaria     Past Surgical History:  Procedure Laterality Date  . ABDOMINAL HYSTERECTOMY  2006   b/l SPO and TAH, uterine cancer  . EYE SURGERY Bilateral 1960  . TUBAL LIGATION      Vitals:   07/21/18 1017  BP: 124/79  Pulse: 63  SpO2: 97%    Subjective Assessment - 07/21/18 1019    Subjective  Reports that she is anxious about her sister who is alone in [place]. Has an e-appointment with her PCP tomorrow. No pain today but did have a lot of pain over the weekend in her L hip with radiation down the lateral LE.     Pertinent History  MI 2019, DM, vascular dementia with behavior disturbance, CAD, GERD, HLD, hypokalemia, lumbosacral DDD, CKD III, HTN, hx of uterine CA    Diagnostic tests  03/12/18 lumbar xray: There is no acute bony abnormality of the lumbar spine. Mild chronic changes as described.    Currently in Pain?  No/denies                       [facility] Adult PT Treatment/Exercise - 07/21/18 0001      Lumbar Exercises: Aerobic   Nustep  Lvl 4, 6 min UE/LEs      Lumbar Exercises: Seated   Long Arc Quad on [place]   Strengthening;Right;Left;1 set;10 reps    LAQ on [employer] (lbs)  2    LAQ on [place] Limitations  sitting on green pball with intermittent min A d/t imbalance   most difficulty on L LE   Hip Flexion on Ball  Strengthening;Right;Left;20 reps    Hip Flexion on Ball Limitations  sitting on green pball x20   intermittent min A d/t imbalance   Other Seated Lumbar Exercises  sitting on green pball ant/pos pelvic tilts x10   most difficulty with anterior pelvic tilts     Lumbar Exercises: Quadruped   [place]/Old Horse  10 reps    Madcat/Old Horse Limitations  heavy manual cues to promote ant/pos pelvic tilting    Single Arm Raise  Right;Left;10 reps    Single Arm Raises Limitations  cues to maintain neutral spine and contract core    Straight Leg Raise  10 reps    Straight Leg Raises Limitations  good effort to maintain neutral spine      Shoulder Exercises: Seated   Row  Strengthening;Both;10 reps;Theraband    Theraband Level (Shoulder Row)  Level 2 (Red)    Row  Limitations  sitting on green pball    cues to maintain elbows in and scapular depression   Horizontal ABduction  Strengthening;Both;10 reps;Theraband    Theraband Level (Shoulder Horizontal ABduction)  Level 1 (Yellow)    Horizontal ABduction Limitations  sitting on green pball    cues for scapular retraction and depression              PT Short Term Goals - 07/11/18 1031      PT SHORT TERM GOAL #1   Title  Patient to be independent with initial HEP.    Time  4    Period  Weeks    Status  Achieved        PT Long Term Goals - 07/11/18 1031      PT LONG TERM GOAL #1   Title  Patient to be independnet with advanced HEP.    Time  4    Period  Weeks    Status  Partially Met   met for current   Target Date  08/08/18      PT LONG TERM GOAL #2   Title  Patient to demonstrate [facility] and pain-free lumbar AROM.    Time  4    Period  Weeks    Status  Partially Met   improvement in lumbar flexion, extension, R & L  sidebending pain levels and R rotation AROM   Target Date  08/08/18      PT LONG TERM GOAL #3   Title  Patient to demonstrate B LE & UE strength >=4+/5.     Time  4    Period  Weeks    Status  Partially Met   decreased strength in B shoulder IR and ER and B hip adduction strength   Target Date  08/08/18      PT LONG TERM GOAL #4   Title  Patient to report 50% improvement in pain levels with supine<>sit transfers.     Time  4    Period  Weeks    Status  Achieved   reports 50% or more improvement     PT LONG TERM GOAL #5   Title  Patient to report 50% improvement in cervical pain with ADLs.    Time  4    Period  Weeks    Status  Partially Met   reports "near 50%" improvement in cervical pain    Target Date  08/08/18            Plan - 07/21/18 1100    Clinical Impression Statement  Patient arrived to session with no new complaints. Worked on lumbopelvic ROM and core stability in quadruped. Patient with most difficulty with cat/cow, more success with exercises requiring neutral spine. Continued core strengthening with sitting on pball exercises- patient with most difficulty with LAQ exercise on L. Improved performance after cues to shift weight to R before lifting L LE. Patient fairly unstable when given compliant surface challenges d/t poor core activation. Reported very mild LBP at end of session but declined modalities. No complaints at end of session.     Clinical Impairments Affecting Rehab Potential  MI 2019, DM, vascular dementia with behavior disturbance, CAD, GERD, HLD, hypokalemia, lumbosacral DDD, CKD III, HTN, hx of uterine CA    PT Treatment/Interventions  ADLs/Self Care Home Management;Cryotherapy;Electrical Stimulation;Functional mobility training;Stair training;Gait training;DME Instruction;Ultrasound;Traction;Moist Heat;Therapeutic activities;Therapeutic exercise;Balance training;Neuromuscular re-education;Patient/family education;Passive range of motion;Manual  techniques;Dry needling;Energy conservation;Splinting;Taping    PT Next Visit Plan  Progress periscapular and  hip strength    Consulted and Agree with Plan of Care  Patient       Patient will benefit from skilled therapeutic intervention in order to improve the following deficits and impairments:  Decreased range of motion, Difficulty walking, Increased muscle spasms, Decreased activity tolerance, Pain, Decreased balance, Impaired flexibility, Improper body mechanics, Postural dysfunction, Decreased strength  Visit Diagnosis: Chronic bilateral low back pain without sciatica  Cervicalgia  Other symptoms and signs involving the musculoskeletal system  Muscle weakness (generalized)  Abnormal posture     Problem List Patient Active Problem List   Diagnosis Date Noted  . Varicose veins of right lower extremity with complications 25/49/8264  . URI (upper respiratory infection) 05/08/2018  . Vascular dementia with behavior disturbance ([place]) 02/24/2018  . Myalgia 02/09/2018  . Seasonal allergic rhinitis due to pollen 01/21/2018  . Cough 12/23/2017  . Allergic urticaria 12/23/2017  . History of myocardial infarction in last year 12/23/2017  . Diabetes mellitus ([place]) 12/23/2017  . Rash and nonspecific skin eruption 12/23/2017  . Intrinsic atopic dermatitis 12/23/2017  . CAD (coronary artery disease) 09/25/2017  . Low back pain 09/25/2017  . Gastroesophageal reflux disease with esophagitis 09/04/2017  . Hyperlipidemia associated with type 2 diabetes mellitus ([place]) 12/31/2016  . Hypokalemia 10/28/2015  . DDD (degenerative disc disease), lumbosacral 09/13/2015  . Diverticulosis of large intestine 09/13/2015  . Seborrheic dermatitis of scalp 09/13/2015  . Chronic kidney disease, stage III (moderate) ([place]) 05/02/2015  . Type 2 diabetes mellitus without complication, without long-term current use of insulin ([place]) 01/18/2015  . Allergic rhinitis 07/14/2014  . Essential hypertension  07/14/2014  . History of uterine cancer 07/14/2014    Janene Harvey, PT, DPT 07/21/18 11:08 AM    [facility] [address]  [place], [place], [address] Phone: [phone]   Fax:  [phone]  Name: Markitta Ausburn MRN: [id number] Date of Birth: 06/28/1945

## 2018-07-22 ENCOUNTER — Ambulatory Visit (INDEPENDENT_AMBULATORY_CARE_PROVIDER_SITE_OTHER): Payer: Medicare Other | Admitting: *Deleted

## 2018-07-22 ENCOUNTER — Encounter: Payer: Self-pay | Admitting: *Deleted

## 2018-07-22 ENCOUNTER — Other Ambulatory Visit: Payer: Self-pay

## 2018-07-22 ENCOUNTER — Ambulatory Visit: Payer: Medicare Other | Admitting: *Deleted

## 2018-07-22 DIAGNOSIS — Z0000 Encounter for general adult medical examination without abnormal findings: Secondary | ICD-10-CM

## 2018-07-22 NOTE — Patient Instructions (Signed)
Please schedule your next medicare wellness visit with me in 1 yr.  Continue to eat heart healthy diet (full of fruits, vegetables, whole grains, lean protein, water--limit salt, fat, and sugar intake) and increase physical activity as tolerated.  Continue doing brain stimulating activities (puzzles, reading, adult coloring books, staying active) to keep memory sharp.   Bring a copy of your living will and/or healthcare power of attorney to your next office visit.   Kristina Lee , Thank you for taking time to come for your Medicare Wellness Visit. I appreciate your ongoing commitment to your health goals. Please review the following plan we discussed and let me know if I can assist you in the future.   These are the goals we discussed: Goals    . Increase physical activity       This is a list of the screening recommended for you and due dates:  Health Maintenance  Topic Date Due  .  Hepatitis C: One time screening is recommended by Center for Disease Control  (CDC) for  adults born from [birth year] through 1965.   1946/03/29  . Complete foot exam   09/07/1955  . Eye exam for diabetics  09/07/1955  . DEXA scan (bone density measurement)  09/07/2010  . Hemoglobin A1C  10/04/2018  . Flu Shot  11/15/2018  . Mammogram  10/29/2019  . Tetanus Vaccine  07/13/2024  . Colon Cancer Screening  04/16/2026  . Pneumonia vaccines  Completed    Health Maintenance After Age 61 After age 60, you are at a higher risk for certain long-term diseases and infections as well as injuries from falls. Falls are a major cause of broken bones and head injuries in people who are older than age 45. Getting regular preventive care can help to keep you healthy and well. Preventive care includes getting regular testing and making lifestyle changes as recommended by your health care provider. Talk with your health care provider about:  Which screenings and tests you should have. A screening is a test that checks for a  disease when you have no symptoms.  A diet and exercise plan that is right for you. What should I know about screenings and tests to prevent falls? Screening and testing are the best ways to find a health problem early. Early diagnosis and treatment give you the best chance of managing medical conditions that are common after age 6. Certain conditions and lifestyle choices may make you more likely to have a fall. Your health care provider may recommend:  Regular vision checks. Poor vision and conditions such as cataracts can make you more likely to have a fall. If you wear glasses, make sure to get your prescription updated if your vision changes.  Medicine review. Work with your health care provider to regularly review all of the medicines you are taking, including over-the-counter medicines. Ask your health care provider about any side effects that may make you more likely to have a fall. Tell your health care provider if any medicines that you take make you feel dizzy or sleepy.  Osteoporosis screening. Osteoporosis is a condition that causes the bones to get weaker. This can make the bones weak and cause them to break more easily.  Blood pressure screening. Blood pressure changes and medicines to control blood pressure can make you feel dizzy.  Strength and balance checks. Your health care provider may recommend certain tests to check your strength and balance while standing, walking, or changing positions.  Foot health exam.  Foot pain and numbness, as well as not wearing proper footwear, can make you more likely to have a fall.  Depression screening. You may be more likely to have a fall if you have a fear of falling, feel emotionally low, or feel unable to do activities that you used to do.  Alcohol use screening. Using too much alcohol can affect your balance and may make you more likely to have a fall. What actions can I take to lower my risk of falls? General instructions  Talk with  your health care provider about your risks for falling. Tell your health care provider if: ? You fall. Be sure to tell your health care provider about all falls, even ones that seem minor. ? You feel dizzy, sleepy, or off-balance.  Take over-the-counter and prescription medicines only as told by your health care provider. These include any supplements.  Eat a healthy diet and maintain a healthy weight. A healthy diet includes low-fat dairy products, low-fat (lean) meats, and fiber from whole grains, beans, and lots of fruits and vegetables. Home safety  Remove any tripping hazards, such as rugs, cords, and clutter.  Install safety equipment such as grab bars in bathrooms and safety rails on stairs.  Keep rooms and walkways well-lit. Activity   Follow a regular exercise program to stay fit. This will help you maintain your balance. Ask your health care provider what types of exercise are appropriate for you.  If you need a cane or walker, use it as recommended by your health care provider.  Wear supportive shoes that have nonskid soles. Lifestyle  Do not drink alcohol if your health care provider tells you not to drink.  If you drink alcohol, limit how much you have: ? 0-1 drink a day for women. ? 0-2 drinks a day for men.  Be aware of how much alcohol is in your drink. In the [place] one drink equals one typical bottle of beer (12 oz), one-half glass of wine (5 oz), or one shot of hard liquor (1 oz).  Do not use any products that contain nicotine or tobacco, such as cigarettes and e-cigarettes. If you need help quitting, ask your health care provider. Summary  Having a healthy lifestyle and getting preventive care can help to protect your health and wellness after age 52.  Screening and testing are the best way to find a health problem early and help you avoid having a fall. Early diagnosis and treatment give you the best chance for managing medical conditions that are more common  for people who are older than age 38.  Falls are a major cause of broken bones and head injuries in people who are older than age 18. Take precautions to prevent a fall at home.  Work with your health care provider to learn what changes you can make to improve your health and wellness and to prevent falls. This information is not intended to replace advice given to you by your health care provider. Make sure you discuss any questions you have with your health care provider. Document Released: 02/13/2017 Document Revised: 02/13/2017 Document Reviewed: 02/13/2017 Elsevier Interactive Patient Education  2019 [employer].

## 2018-07-23 ENCOUNTER — Ambulatory Visit: Payer: Medicare Other

## 2018-07-24 ENCOUNTER — Telehealth: Payer: Self-pay | Admitting: Family Medicine

## 2018-07-24 ENCOUNTER — Other Ambulatory Visit: Payer: Self-pay | Admitting: Family Medicine

## 2018-07-24 MED ORDER — TICAGRELOR 90 MG PO TABS: 90.0000 mg | ORAL_TABLET | Freq: Two times a day (BID) | ORAL | 0 refills | Status: DC

## 2018-07-24 MED ORDER — ATORVASTATIN CALCIUM 80 MG PO TABS: 80.0000 mg | ORAL_TABLET | Freq: Every day | ORAL | 1 refills | Status: DC

## 2018-07-24 NOTE — Telephone Encounter (Signed)
That is fine to refill her meds. We do not want her to run out but they are her cardiac meds so I reviewed their note and they do want her to take the meds, if she wants to discuss we can do a virtual visit next week

## 2018-07-24 NOTE — Telephone Encounter (Signed)
Pt wants to know if she should be continuing  her ticagrelor (BRILINTA) 90 MG TABS tablet  and   lisinopril (PRINIVIL,ZESTRIL) 2.5 MG tablet

## 2018-07-24 NOTE — Telephone Encounter (Signed)
Yes per cardiology she should not stop any meds

## 2018-07-24 NOTE — Addendum Note (Signed)
Addended by: Scharlene Gloss B on: 07/24/2018 04:01 PM   Modules accepted: Orders

## 2018-07-24 NOTE — Telephone Encounter (Signed)
She would like to know if you would like her on stay on lisinopril or not? She needs advisement

## 2018-07-24 NOTE — Telephone Encounter (Signed)
Copied from CRM [id number]. Topic: Quick Communication - Rx Refill/Question >> Jul 24, 2018 10:16 AM Floria Raveling A wrote: Medication: atorvastatin (LIPITOR) 80 MG tablet [[id number]] (BRILINTA) 90 MG TABS tablet [[id number]] lisinopril (PRINIVIL,ZESTRIL) 2.5 MG tablet [[id number]] -  Pt wanted to fu with DR about this med/  she wanted to know if this is going to be changed or what she is suppose to do about taking this med?   Best number  [phone]   Has the patient contacted their pharmacy? No. (Agent: If no, request that the patient contact the pharmacy for the refill.) (Agent: If yes, when and what did the pharmacy advise?)  Preferred Pharmacy (with phone number or street name): [id number] - [place], [place] - [address] AT [address]OSS FROM [address] [phone] (Phone)   Agent: Please be advised that RX refills may take up to 3 business days. We ask that you follow-up with your pharmacy.

## 2018-07-24 NOTE — Telephone Encounter (Signed)
I refilled medication for patient. I do not see where the meds have been d/c  Please advise

## 2018-07-28 NOTE — Telephone Encounter (Signed)
Patient informed of instructions. Patient is scheduled for 08/07/2018 already/changed to virtual visit. Patient has hives again and can be discussed on her virtual visit on 08/07/2018---wanted PCP to be aware.

## 2018-07-28 NOTE — Telephone Encounter (Signed)
Called left message to call back 

## 2018-07-28 NOTE — Telephone Encounter (Signed)
If she needs a steroid pak prior to the visit due to severe pruritus I am willing to consider

## 2018-07-29 ENCOUNTER — Other Ambulatory Visit: Payer: Self-pay

## 2018-07-29 ENCOUNTER — Encounter: Payer: Self-pay | Admitting: Physical Therapy

## 2018-07-29 ENCOUNTER — Ambulatory Visit: Payer: Medicare Other | Admitting: Physical Therapy

## 2018-07-29 VITALS — BP 140/72 | HR 57

## 2018-07-29 DIAGNOSIS — R29898 Other symptoms and signs involving the musculoskeletal system: Secondary | ICD-10-CM

## 2018-07-29 DIAGNOSIS — M545 Low back pain: Principal | ICD-10-CM

## 2018-07-29 DIAGNOSIS — G8929 Other chronic pain: Secondary | ICD-10-CM

## 2018-07-29 DIAGNOSIS — M6281 Muscle weakness (generalized): Secondary | ICD-10-CM

## 2018-07-29 DIAGNOSIS — R293 Abnormal posture: Secondary | ICD-10-CM

## 2018-07-29 DIAGNOSIS — M542 Cervicalgia: Secondary | ICD-10-CM

## 2018-07-29 NOTE — Therapy (Signed)
[facility] [address]  [place], [place], [address] Phone: [phone]   Fax:  [phone]  Physical Therapy Treatment  Patient Details  Name: Kristina Lee MRN: [id number] Date of Birth: Jul 27, 1945 Referring Provider (PT): Karlton Lemon, MD   Encounter Date: 07/29/2018  PT End of Session - 07/29/18 1101    Visit Number  17    Number of Visits  18    Date for PT Re-Evaluation  08/08/18    Authorization Type  UHC Medicare    PT Start Time  1018    PT Stop Time  1100    PT Time Calculation (min)  42 min    Activity Tolerance  Patient tolerated treatment well    Behavior During Therapy  [facility] for tasks assessed/performed       Past Medical History:  Diagnosis Date  . Diabetes mellitus without complication ([place])   . Heart attack ([place]) 09/07/2017  . Urticaria     Past Surgical History:  Procedure Laterality Date  . ABDOMINAL HYSTERECTOMY  2006   b/l SPO and TAH, uterine cancer  . EYE SURGERY Bilateral 1960  . TUBAL LIGATION      Vitals:   07/29/18 1019  BP: 140/72  Pulse: (!) 57  SpO2: 98%    Subjective Assessment - 07/29/18 1021    Subjective  Reports not much pain this past week. Has been pretty busy. Believes she is as good as she is going to get. Reports she has pain, but it is no longer excruciating. Agreeable to 30 day hold at next sesison.     Pertinent History  MI 2019, DM, vascular dementia with behavior disturbance, CAD, GERD, HLD, hypokalemia, lumbosacral DDD, CKD III, HTN, hx of uterine CA    Diagnostic tests  03/12/18 lumbar xray: There is no acute bony abnormality of the lumbar spine. Mild chronic changes as described.    Currently in Pain?  Yes    Pain Score  3     Pain Location  Neck    Pain Orientation  Right    Pain Descriptors / Indicators  Sharp    Pain Type  Chronic pain    Pain Score  3    Pain Location  Back    Pain Orientation  Right;Lower    Pain Descriptors /  Indicators  Aching    Pain Type  Chronic pain                       [facility] Adult PT Treatment/Exercise - 07/29/18 0001      Lumbar Exercises: Stretches   Lower Trunk Rotation  Limitations    Lower Trunk Rotation Limitations  x20 to tolerance    Piriformis Stretch  Right;30 seconds;Left;1 rep    Piriformis Stretch Limitations  KTOS     Figure 4 Stretch  1 rep;30 seconds;With overpressure    Figure 4 Stretch Limitations  to tolerance      Lumbar Exercises: Aerobic   Nustep  Lvl 4, 6 min LEs only      Lumbar Exercises: Standing   Other Standing Lumbar Exercises  R & L paloff press x10 each side with red TB   cues for form and wider stance     Lumbar Exercises: Sidelying   Other Sidelying Lumbar Exercises  R & L open book stretch x10 to tolerance   cues to decrease speed     Knee/Hip Exercises: Standing  Functional Squat  1 set;15 reps    Functional Squat Limitations  yellow TB around knees   good form   Wall Squat  1 set;10 reps    Wall Squat Limitations  with ball squeeze    Other Standing Knee Exercises  sidestepping with yellow TB around toes4x length of counter   cues to maintain upright trunk     Shoulder Exercises: Standing   Extension  Strengthening;Both;15 reps;Theraband    Theraband Level (Shoulder Extension)  Level 2 (Red)    Extension Limitations  cues to decrease speed    Row  Strengthening;Both;Theraband;20 reps    Theraband Level (Shoulder Row)  Level 3 (Green)    Row Limitations  cues for scapular depression               PT Short Term Goals - 07/11/18 1031      PT SHORT TERM GOAL #1   Title  Patient to be independent with initial HEP.    Time  4    Period  Weeks    Status  Achieved        PT Long Term Goals - 07/11/18 1031      PT LONG TERM GOAL #1   Title  Patient to be independnet with advanced HEP.    Time  4    Period  Weeks    Status  Partially Met   met for current   Target Date  08/08/18      PT LONG TERM  GOAL #2   Title  Patient to demonstrate [facility] and pain-free lumbar AROM.    Time  4    Period  Weeks    Status  Partially Met   improvement in lumbar flexion, extension, R & L sidebending pain levels and R rotation AROM   Target Date  08/08/18      PT LONG TERM GOAL #3   Title  Patient to demonstrate B LE & UE strength >=4+/5.     Time  4    Period  Weeks    Status  Partially Met   decreased strength in B shoulder IR and ER and B hip adduction strength   Target Date  08/08/18      PT LONG TERM GOAL #4   Title  Patient to report 50% improvement in pain levels with supine<>sit transfers.     Time  4    Period  Weeks    Status  Achieved   reports 50% or more improvement     PT LONG TERM GOAL #5   Title  Patient to report 50% improvement in cervical pain with ADLs.    Time  4    Period  Weeks    Status  Partially Met   reports "near 50%" improvement in cervical pain    Target Date  08/08/18            Plan - 07/29/18 1101    Clinical Impression Statement  Patient with no new complaints today. Reports that she has noticed good benefit in neck and LBP since starting PT, and has manageable pain at this point rather than excruciating pain. Patient open to 30 day hold at next session. Worked on progressive LE and periscapular strengthening with banded resistance this session. Patient with good carryover of form with wall squats and squats at counter top. Minor cues required to encourage scapular retraction and depression with periscapular strengthening. Introduced [employer] with patient reporting difficulty. Ended session with gentle lumbopelvic  ROM and LE strengthening, with patient still reporting back pain when sitting up to leave session. Pain subsided after a couple seconds of standing. No further complaints at end of session.     Clinical Impairments Affecting Rehab Potential  MI 2019, DM, vascular dementia with behavior disturbance, CAD, GERD, HLD, hypokalemia, lumbosacral DDD,  CKD III, HTN, hx of uterine CA    PT Treatment/Interventions  ADLs/Self Care Home Management;Cryotherapy;Electrical Stimulation;Functional mobility training;Stair training;Gait training;DME Instruction;Ultrasound;Traction;Moist Heat;Therapeutic activities;Therapeutic exercise;Balance training;Neuromuscular re-education;Patient/family education;Passive range of motion;Manual techniques;Dry needling;Energy conservation;Splinting;Taping    PT Next Visit Plan  30 day hold    Consulted and Agree with Plan of Care  Patient       Patient will benefit from skilled therapeutic intervention in order to improve the following deficits and impairments:  Decreased range of motion, Difficulty walking, Increased muscle spasms, Decreased activity tolerance, Pain, Decreased balance, Impaired flexibility, Improper body mechanics, Postural dysfunction, Decreased strength  Visit Diagnosis: Chronic bilateral low back pain without sciatica  Cervicalgia  Other symptoms and signs involving the musculoskeletal system  Muscle weakness (generalized)  Abnormal posture     Problem List Patient Active Problem List   Diagnosis Date Noted  . Varicose veins of right lower extremity with complications 38/88/7579  . URI (upper respiratory infection) 05/08/2018  . Vascular dementia with behavior disturbance ([place]) 02/24/2018  . Myalgia 02/09/2018  . Seasonal allergic rhinitis due to pollen 01/21/2018  . Cough 12/23/2017  . Allergic urticaria 12/23/2017  . History of myocardial infarction in last year 12/23/2017  . Diabetes mellitus ([place]) 12/23/2017  . Rash and nonspecific skin eruption 12/23/2017  . Intrinsic atopic dermatitis 12/23/2017  . CAD (coronary artery disease) 09/25/2017  . Low back pain 09/25/2017  . Gastroesophageal reflux disease with esophagitis 09/04/2017  . Hyperlipidemia associated with type 2 diabetes mellitus ([place]) 12/31/2016  . Hypokalemia 10/28/2015  . DDD (degenerative disc disease),  lumbosacral 09/13/2015  . Diverticulosis of large intestine 09/13/2015  . Seborrheic dermatitis of scalp 09/13/2015  . Chronic kidney disease, stage III (moderate) ([place]) 05/02/2015  . Type 2 diabetes mellitus without complication, without long-term current use of insulin ([place]) 01/18/2015  . Allergic rhinitis 07/14/2014  . Essential hypertension 07/14/2014  . History of uterine cancer 07/14/2014    Janene Harvey, PT, DPT 07/29/18 11:06 AM   [facility] [address]  [place], [place], [address] Phone: [phone]   Fax:  [phone]  Name: Leotta Weingarten MRN: [id number] Date of Birth: 16-Aug-1945

## 2018-08-04 ENCOUNTER — Ambulatory Visit: Payer: Medicare Other

## 2018-08-04 ENCOUNTER — Other Ambulatory Visit: Payer: Self-pay

## 2018-08-04 DIAGNOSIS — M545 Low back pain: Secondary | ICD-10-CM | POA: Diagnosis not present

## 2018-08-04 DIAGNOSIS — R293 Abnormal posture: Secondary | ICD-10-CM

## 2018-08-04 DIAGNOSIS — G8929 Other chronic pain: Secondary | ICD-10-CM

## 2018-08-04 DIAGNOSIS — M6281 Muscle weakness (generalized): Secondary | ICD-10-CM

## 2018-08-04 DIAGNOSIS — R29898 Other symptoms and signs involving the musculoskeletal system: Secondary | ICD-10-CM

## 2018-08-04 DIAGNOSIS — M542 Cervicalgia: Secondary | ICD-10-CM

## 2018-08-04 NOTE — Therapy (Signed)
[facility] [address]  [place], [place], [address] Phone: [phone]   Fax:  [phone]  Physical Therapy Treatment  Patient Details  Name: Kristina Lee MRN: [id number] Date of Birth: 1946/03/14 Referring Provider (PT): Karlton Lemon, MD   Encounter Date: 08/04/2018  PT End of Session - 08/04/18 1413    Visit Number  18    Number of Visits  18    Date for PT Re-Evaluation  08/08/18    Authorization Type  UHC Medicare    PT Start Time  3664    PT Stop Time  1450    PT Time Calculation (min)  47 min    Activity Tolerance  Patient tolerated treatment well    Behavior During Therapy  [facility] for tasks assessed/performed       Past Medical History:  Diagnosis Date  . Diabetes mellitus without complication ([place])   . Heart attack ([place]) 09/07/2017  . Urticaria     Past Surgical History:  Procedure Laterality Date  . ABDOMINAL HYSTERECTOMY  2006   b/l SPO and TAH, uterine cancer  . EYE SURGERY Bilateral 1960  . TUBAL LIGATION      There were no vitals filed for this visit.  Subjective Assessment - 08/04/18 1411    Subjective  Pt. noting "yeah I actually have more pain today for some reason".      Pertinent History  MI 2019, DM, vascular dementia with behavior disturbance, CAD, GERD, HLD, hypokalemia, lumbosacral DDD, CKD III, HTN, hx of uterine CA    Diagnostic tests  03/12/18 lumbar xray: There is no acute bony abnormality of the lumbar spine. Mild chronic changes as described.    Currently in Pain?  Yes    Pain Score  4     Pain Location  Neck    Pain Orientation  Right    Pain Descriptors / Indicators  Sharp    Pain Type  Chronic pain    Pain Score  2    Pain Location  Hip    Pain Orientation  Right;Lower    Pain Descriptors / Indicators  Aching    Pain Type  Chronic pain    Pain Radiating Towards  into R hip/LE         [facility] PT Assessment - 08/04/18 0001      AROM   Lumbar Flexion  toes     Lumbar Extension  mildly limited     Lumbar - Right Side Bend  distal thigh   R sided back pain    Lumbar - Left Side Bend  distal thigh    Lumbar - Right Rotation  milldly limited    Lumbar - Left Rotation  [facility]      Strength   Right Shoulder Flexion  4+/5    Right Shoulder ABduction  4+/5    Right Shoulder Internal Rotation  4+/5    Right Shoulder External Rotation  4+/5    Left Shoulder Flexion  4+/5    Left Shoulder ABduction  4+/5    Left Shoulder Internal Rotation  4/5    Left Shoulder External Rotation  4+/5    Right Hip Flexion  4+/5    Right Hip ABduction  4+/5    Right Hip ADduction  4+/5    Left Hip Flexion  4+/5    Left Hip ABduction  4/5    Left Hip ADduction  4+/5  [facility] Adult PT Treatment/Exercise - 08/04/18 0001      Self-Care   Self-Care  Other Self-Care Comments    Other Self-Care Comments   Review of home program with minor adjustment to focus on remaining strength deficitis and review of comprehensive program      Lumbar Exercises: Aerobic   Recumbent Bike  Lvl 2, 7 min                PT Short Term Goals - 07/11/18 1031      PT SHORT TERM GOAL #1   Title  Patient to be independent with initial HEP.    Time  4    Period  Weeks    Status  Achieved        PT Long Term Goals - 08/04/18 1413      PT LONG TERM GOAL #1   Title  Patient to be independnet with advanced HEP.    Time  4    Period  Weeks    Status  Partially Met   met for current     PT LONG TERM GOAL #2   Title  Patient to demonstrate [facility] and pain-free lumbar AROM.    Time  4    Period  Weeks    Status  Partially Met   still mildly limited in lumbar AROM extension, R rotation      PT LONG TERM GOAL #3   Title  Patient to demonstrate B LE & UE strength >=4+/5.     Time  4    Period  Weeks    Status  Partially Met      PT LONG TERM GOAL #4   Title  Patient to report 50% improvement in pain levels with supine<>sit transfers.     Time   4    Period  Weeks    Status  Achieved   reports 70% or more improvement     PT LONG TERM GOAL #5   Title  Patient to report 50% improvement in cervical pain with ADLs.    Time  4    Period  Weeks    Status  Achieved   60%" improvement in cervical pain            Plan - 08/04/18 1454    Clinical Impression Statement  Pt. requesting to go on 30-day hold and supervising PT approving this plan.  Pt. has made good progress with therapy able to achieve or partially achieve all LTGs with therapy.  Pt. noting ~ 60% improvement in neck pain with ADLs since starting therapy, and note ~ 70% improvement in pain with supine<>sit transition since starting therapy.  Comprehensive HEP reviewed with pt. today with minor corrections made to help pt. focus on remaining strength deficits.  Discussed need for ongoing community wellness program with pt. verbalizing plans for regular walking around neighborhood and attending local [employer] upon resolution of COVID-19 pandemic.  Pt. left session verbalizing understanding of 30-day hold from therapy.      Rehab Potential  Good    Clinical Impairments Affecting Rehab Potential  MI 2019, DM, vascular dementia with behavior disturbance, CAD, GERD, HLD, hypokalemia, lumbosacral DDD, CKD III, HTN, hx of uterine CA    PT Treatment/Interventions  ADLs/Self Care Home Management;Cryotherapy;Electrical Stimulation;Functional mobility training;Stair training;Gait training;DME Instruction;Ultrasound;Traction;Moist Heat;Therapeutic activities;Therapeutic exercise;Balance training;Neuromuscular re-education;Patient/family education;Passive range of motion;Manual techniques;Dry needling;Energy conservation;Splinting;Taping    PT Next Visit Plan  30 day hold    Consulted and Agree with Plan of  Care  Patient       Patient will benefit from skilled therapeutic intervention in order to improve the following deficits and impairments:  Decreased range of motion, Difficulty walking,  Increased muscle spasms, Decreased activity tolerance, Pain, Decreased balance, Impaired flexibility, Improper body mechanics, Postural dysfunction, Decreased strength  Visit Diagnosis: Chronic bilateral low back pain without sciatica  Cervicalgia  Other symptoms and signs involving the musculoskeletal system  Muscle weakness (generalized)  Abnormal posture     Problem List Patient Active Problem List   Diagnosis Date Noted  . Varicose veins of right lower extremity with complications 22/97/9892  . URI (upper respiratory infection) 05/08/2018  . Vascular dementia with behavior disturbance ([place]) 02/24/2018  . Myalgia 02/09/2018  . Seasonal allergic rhinitis due to pollen 01/21/2018  . Cough 12/23/2017  . Allergic urticaria 12/23/2017  . History of myocardial infarction in last year 12/23/2017  . Diabetes mellitus ([place]) 12/23/2017  . Rash and nonspecific skin eruption 12/23/2017  . Intrinsic atopic dermatitis 12/23/2017  . CAD (coronary artery disease) 09/25/2017  . Low back pain 09/25/2017  . Gastroesophageal reflux disease with esophagitis 09/04/2017  . Hyperlipidemia associated with type 2 diabetes mellitus ([place]) 12/31/2016  . Hypokalemia 10/28/2015  . DDD (degenerative disc disease), lumbosacral 09/13/2015  . Diverticulosis of large intestine 09/13/2015  . Seborrheic dermatitis of scalp 09/13/2015  . Chronic kidney disease, stage III (moderate) ([place]) 05/02/2015  . Type 2 diabetes mellitus without complication, without long-term current use of insulin ([place]) 01/18/2015  . Allergic rhinitis 07/14/2014  . Essential hypertension 07/14/2014  . History of uterine cancer 07/14/2014    Bess Harvest, PTA 08/04/18 2:58 PM   [facility] [address]  [place], [place], [address] Phone: [phone]   Fax:  [phone]  Name: Zuleyma Scharf MRN: [id number] Date of Birth: January 18, 1946

## 2018-08-07 ENCOUNTER — Ambulatory Visit (INDEPENDENT_AMBULATORY_CARE_PROVIDER_SITE_OTHER): Payer: Medicare Other | Admitting: Family Medicine

## 2018-08-07 ENCOUNTER — Other Ambulatory Visit: Payer: Self-pay

## 2018-08-07 DIAGNOSIS — K2100 Gastro-esophageal reflux disease with esophagitis, without bleeding: Secondary | ICD-10-CM

## 2018-08-07 DIAGNOSIS — L500 Allergic urticaria: Secondary | ICD-10-CM

## 2018-08-07 DIAGNOSIS — E1169 Type 2 diabetes mellitus with other specified complication: Secondary | ICD-10-CM

## 2018-08-07 DIAGNOSIS — L659 Nonscarring hair loss, unspecified: Secondary | ICD-10-CM | POA: Diagnosis not present

## 2018-08-07 DIAGNOSIS — H6091 Unspecified otitis externa, right ear: Secondary | ICD-10-CM

## 2018-08-07 DIAGNOSIS — I10 Essential (primary) hypertension: Secondary | ICD-10-CM | POA: Diagnosis not present

## 2018-08-07 DIAGNOSIS — H9190 Unspecified hearing loss, unspecified ear: Secondary | ICD-10-CM | POA: Diagnosis not present

## 2018-08-07 MED ORDER — FAMOTIDINE 20 MG PO TABS: 20.0000 mg | ORAL_TABLET | Freq: Two times a day (BID) | ORAL | 1 refills | Status: DC | PRN

## 2018-08-07 MED ORDER — BLOOD GLUCOSE MONITOR KIT: PACK | 0 refills | Status: DC

## 2018-08-07 MED ORDER — TICAGRELOR 90 MG PO TABS: ORAL_TABLET | ORAL | 1 refills | Status: DC

## 2018-08-07 MED ORDER — MINOXIDIL 2 % EX SOLN: Freq: Two times a day (BID) | CUTANEOUS | 0 refills | Status: DC

## 2018-08-07 MED ORDER — LISINOPRIL 2.5 MG PO TABS: 2.5000 mg | ORAL_TABLET | Freq: Every day | ORAL | 1 refills | Status: DC

## 2018-08-07 MED ORDER — NEOMYCIN-COLIST-HC-THONZONIUM 3.3-3-10-0.5 MG/ML OT SUSP: 3.0000 [drp] | Freq: Three times a day (TID) | OTIC | 0 refills | Status: DC

## 2018-08-07 NOTE — Progress Notes (Signed)
Does not now if she should still be taking the pepcid.  Lisinopril should she continue this medication  Wants to know should she continue Allegra  Diclofenac my need more for pain Right ear pain  Should she use rogaine Rosacea/ may need to see dermatologist

## 2018-08-10 DIAGNOSIS — H9190 Unspecified hearing loss, unspecified ear: Secondary | ICD-10-CM | POA: Insufficient documentation

## 2018-08-10 DIAGNOSIS — L659 Nonscarring hair loss, unspecified: Secondary | ICD-10-CM | POA: Insufficient documentation

## 2018-08-10 DIAGNOSIS — H6090 Unspecified otitis externa, unspecified ear: Secondary | ICD-10-CM

## 2018-08-10 HISTORY — DX: Unspecified otitis externa, unspecified ear: H60.90

## 2018-08-10 HISTORY — DX: Unspecified hearing loss, unspecified ear: H91.90

## 2018-08-10 NOTE — Assessment & Plan Note (Signed)
Some recent pain around ear and manipulation of ear. Use Cortisporin tid as prescribed and let us know if worsens.

## 2018-08-10 NOTE — Assessment & Plan Note (Signed)
Pepcid bid, Allegra bid

## 2018-08-10 NOTE — Assessment & Plan Note (Signed)
Has been worsening over a long period of time and predominantly noted on the right side. Encouraged to consider [employer] or [facility] for evaluation she will let us know if she needs referral.

## 2018-08-10 NOTE — Assessment & Plan Note (Signed)
hgba1c acceptable, minimize simple carbs. Increase exercise as tolerated. Continue current meds 

## 2018-08-10 NOTE — Assessment & Plan Note (Signed)
no changes to meds. Encouraged heart healthy diet such as the DASH diet and exercise as tolerated.  

## 2018-08-10 NOTE — Progress Notes (Signed)
Virtual Visit via Video Note  I connected with Kristina Lee on 08/10/18 at  9:20 AM EDT by a video enabled telemedicine application and verified that I am speaking with the correct person using two identifiers.   I discussed the limitations of evaluation and management by telemedicine and the availability of in person appointments. The patient expressed understanding and agreed to proceed. Princess Eulas Post CMA was able to get patient set up with video platform.     Subjective:    Patient ID: Kristina Lee, female    DOB: 1945/07/08, 73 y.o.   MRN: [id number]  No chief complaint on file.   HPI Patient is in today for follow up on chronic medical concerns including diabetes, hypertension,reflux, hearing loss and hair loss. Has a long hsitory of worsening hearing loss in right ear and has recently worsened. She is also noting some pain in and around right ear recently. No discharge or fevers. Reflux well controlled. Rash is better but intermittent. No obvious triggers. Denies CP/palp/SOB/HA/congestion/fevers/GI or GU c/o. Taking meds as prescribed  Past Medical History:  Diagnosis Date  . Diabetes mellitus without complication ([place])   . Heart attack ([place]) 09/07/2017  . Urticaria     Past Surgical History:  Procedure Laterality Date  . ABDOMINAL HYSTERECTOMY  2006   b/l SPO and TAH, uterine cancer  . EYE SURGERY Bilateral 1960  . TUBAL LIGATION      Family History  Problem Relation Age of Onset  . Cancer Mother        liver cancer, breast cancer x 2   . Alcohol abuse Mother        smoker  . Heart disease Father        MI  . Alcohol abuse Father        smoker  . Depression Sister   . Tremor Sister   . Cancer Brother        pancreatic  . Diabetes Maternal Grandmother   . Hearing loss Maternal Grandmother   . Depression Maternal Grandmother   . Allergic rhinitis Neg Hx   . Angioedema Neg Hx   . Asthma Neg Hx   . Eczema Neg Hx   . Immunodeficiency Neg Hx   .  Urticaria Neg Hx     Social History   Socioeconomic History  . Marital status: Married    Spouse name: Not on file  . Number of children: Not on file  . Years of education: Not on file  . Highest education level: Not on file  Occupational History  . Not on file  Social Needs  . Financial resource strain: Not on file  . Food insecurity:    Worry: Not on file    Inability: Not on file  . Transportation needs:    Medical: Not on file    Non-medical: Not on file  Tobacco Use  . Smoking status: Former Smoker    Packs/day: 0.50    Years: 24.00    Pack years: 12.00    Types: Cigarettes    Last attempt to quit: 04/17/1987    Years since quitting: 31.3  . Smokeless tobacco: Never Used  Substance and Sexual Activity  . Alcohol use: Not Currently  . Drug use: Not Currently  . Sexual activity: Yes  Lifestyle  . Physical activity:    Days per week: Not on file    Minutes per session: Not on file  . Stress: Not on file  Relationships  . Social connections:  Talks on phone: Not on file    Gets together: Not on file    Attends religious service: Not on file    Active member of club or organization: Not on file    Attends meetings of clubs or organizations: Not on file    Relationship status: Not on file  . Intimate partner violence:    Fear of current or ex partner: Not on file    Emotionally abused: Not on file    Physically abused: Not on file    Forced sexual activity: Not on file  Other Topics Concern  . Not on file  Social History Narrative  . Not on file    Outpatient Medications Prior to Visit  Medication Sig Dispense Refill  . aspirin EC 81 MG tablet Take 81 mg by mouth daily.    Marland Kitchen atorvastatin (LIPITOR) 80 MG tablet Take 1 tablet (80 mg total) by mouth daily. 90 tablet 1  . Calcium Carbonate-Vit D-Min (CALCIUM 600+D PLUS MINERALS) 600-400 MG-UNIT TABS Take 1 tablet by mouth daily.     . diclofenac sodium (VOLTAREN) 1 % GEL APPLY 4 G TOPICALLY 4 (FOUR) TIMES  DAILY AS NEEDED FOR PAIN 500 g 1  . fexofenadine (ALLEGRA) 180 MG tablet Take 180 mg by mouth 2 (two) times daily.     Marland Kitchen BRILINTA 90 MG TABS tablet TAKE 1 TABLET (90 MG TOTAL) BY MOUTH 2 (TWO) TIMES DAILY. 180 tablet 1  . famotidine (PEPCID) 20 MG tablet Take 1 tablet (20 mg total) by mouth 2 (two) times daily. 60 tablet 0  . lisinopril (PRINIVIL,ZESTRIL) 2.5 MG tablet Take 1 tablet (2.5 mg total) by mouth daily. 90 tablet 1  . ticagrelor (BRILINTA) 90 MG TABS tablet Take 1 tablet (90 mg total) by mouth 2 (two) times daily. 60 tablet 0   No facility-administered medications prior to visit.     Allergies  Allergen Reactions  . Amoxicillin Anaphylaxis  . Hydroxyzine Itching and Other (See Comments)    Other reaction(s): Other (See Comments) Sedation Sedation Sedation   . Sulfa Antibiotics Swelling    Other reaction(s): Swelling tongue tongue tongue     Review of Systems  Constitutional: Negative for fever and malaise/fatigue.  HENT: Positive for ear pain and hearing loss. Negative for congestion, ear discharge and tinnitus.   Eyes: Negative for blurred vision.  Respiratory: Negative for shortness of breath.   Cardiovascular: Negative for chest pain, palpitations and leg swelling.  Gastrointestinal: Negative for abdominal pain, blood in stool and nausea.  Genitourinary: Negative for dysuria and frequency.  Musculoskeletal: Negative for falls.  Skin: Positive for itching and rash.  Neurological: Negative for dizziness, loss of consciousness and headaches.  Endo/Heme/Allergies: Negative for environmental allergies.  Psychiatric/Behavioral: Negative for depression. The patient is not nervous/anxious.        Objective:    Physical Exam Constitutional:      Appearance: Normal appearance. She is not ill-appearing.  HENT:     Head: Normocephalic and atraumatic.     Right Ear: External ear normal.     Left Ear: External ear normal.     Nose: Nose normal.  Pulmonary:      Effort: Pulmonary effort is normal.  Neurological:     Mental Status: She is alert and oriented to person, place, and time.  Psychiatric:        Mood and Affect: Mood normal.        Behavior: Behavior normal.     BP 140/72 (BP Location:  Left Arm, Patient Position: Sitting, Cuff Size: Normal)   Wt 130 lb (59 kg)   BMI 23.78 kg/m  Wt Readings from Last 3 Encounters:  08/07/18 130 lb (59 kg)  07/09/18 129 lb (58.5 kg)  05/30/18 129 lb (58.5 kg)    Diabetic Foot Exam - Simple   No data filed     Lab Results  Component Value Date   WBC 5.6 04/04/2018   HGB 14.3 04/04/2018   HCT 42.1 04/04/2018   PLT 212.0 04/04/2018   GLUCOSE 113 (H) 04/04/2018   CHOL 100 04/04/2018   TRIG 72.0 04/04/2018   HDL 39.80 04/04/2018   LDLCALC 46 04/04/2018   ALT 16 04/04/2018   AST 26 04/04/2018   NA 143 04/04/2018   K 4.0 04/04/2018   CL 106 04/04/2018   CREATININE 0.89 04/04/2018   BUN 16 04/04/2018   CO2 30 04/04/2018   TSH 0.83 04/04/2018   HGBA1C 6.2 04/04/2018    Lab Results  Component Value Date   TSH 0.83 04/04/2018   Lab Results  Component Value Date   WBC 5.6 04/04/2018   HGB 14.3 04/04/2018   HCT 42.1 04/04/2018   MCV 86.2 04/04/2018   PLT 212.0 04/04/2018   Lab Results  Component Value Date   NA 143 04/04/2018   K 4.0 04/04/2018   CO2 30 04/04/2018   GLUCOSE 113 (H) 04/04/2018   BUN 16 04/04/2018   CREATININE 0.89 04/04/2018   BILITOT 1.2 04/04/2018   ALKPHOS 68 04/04/2018   AST 26 04/04/2018   ALT 16 04/04/2018   PROT 7.1 04/04/2018   ALBUMIN 4.1 04/04/2018   CALCIUM 10.0 04/04/2018   ANIONGAP 11 03/12/2018   GFR 66.16 04/04/2018   Lab Results  Component Value Date   CHOL 100 04/04/2018   Lab Results  Component Value Date   HDL 39.80 04/04/2018   Lab Results  Component Value Date   LDLCALC 46 04/04/2018   Lab Results  Component Value Date   TRIG 72.0 04/04/2018   Lab Results  Component Value Date   CHOLHDL 3 04/04/2018   Lab Results   Component Value Date   HGBA1C 6.2 04/04/2018       Assessment & Plan:   Problem List Items Addressed This Visit    Essential hypertension    no changes to meds. Encouraged heart healthy diet such as the DASH diet and exercise as tolerated.       Relevant Medications   lisinopril (ZESTRIL) 2.5 MG tablet   Gastroesophageal reflux disease with esophagitis    Avoid offending foods, start probiotics. Do not eat large meals in late evening and consider raising head of bed.       Allergic urticaria    Pepcid bid, Allegra bid       Diabetes mellitus ([place])    hgba1c acceptable, minimize simple carbs. Increase exercise as tolerated. Continue current meds      Relevant Medications   lisinopril (ZESTRIL) 2.5 MG tablet   Hair loss    She requests a prescription for Rogaine, is sent to the pharmacy      Hearing loss    Has been worsening over a long period of time and predominantly noted on the right side. Encouraged to consider [facility] or [facility] for evaluation she will let us know if she needs referral.      Otitis externa    Some recent pain around ear and manipulation of ear. Use Cortisporin tid as prescribed and  let us know if worsens.          I have discontinued Charmeka A. Knapke's ticagrelor. I have changed her Brilinta to ticagrelor. I have also changed her famotidine and lisinopril. Additionally, I am having her start on minoxidil, neomycin-colistin-hydrocortisone-thonzonium, and blood glucose meter kit and supplies. Lastly, I am having her maintain her aspirin EC, Calcium 600+D Plus Minerals, fexofenadine, diclofenac sodium, and atorvastatin.  Meds ordered this encounter  Medications  . famotidine (PEPCID) 20 MG tablet    Sig: Take 1 tablet (20 mg total) by mouth 2 (two) times daily as needed for heartburn or indigestion.    Dispense:  180 tablet    Refill:  1  . lisinopril (ZESTRIL) 2.5 MG tablet    Sig: Take 1 tablet (2.5 mg total) by mouth daily.     Dispense:  90 tablet    Refill:  1  . ticagrelor (BRILINTA) 90 MG TABS tablet    Sig: TAKE 1 TABLET (90 MG TOTAL) BY MOUTH 2 (TWO) TIMES DAILY.    Dispense:  180 tablet    Refill:  1  . minoxidil (ROGAINE) 2 % external solution    Sig: Apply topically 2 (two) times daily.    Dispense:  60 mL    Refill:  0  . neomycin-colistin-hydrocortisone-thonzonium (CORTISPORIN-TC) 3.06-16-08-0.5 MG/ML OTIC suspension    Sig: Place 3 drops into the right ear 3 (three) times daily.    Dispense:  10 mL    Refill:  0  . blood glucose meter kit and supplies KIT    Sig: Dispense based on patient and insurance preference. Use up to four times daily as directed.Dx E11.9. Check BS BID PRN    Dispense:  1 each    Refill:  0    Order Specific Question:   Number of strips    Answer:   100    Order Specific Question:   Number of lancets    Answer:   100     I discussed the assessment and treatment plan with the patient. The patient was provided an opportunity to ask questions and all were answered. The patient agreed with the plan and demonstrated an understanding of the instructions.   The patient was advised to call back or seek an in-person evaluation if the symptoms worsen or if the condition fails to improve as anticipated.  I provided 27 minutes of non-face-to-face time during this encounter.   Penni Homans, MD

## 2018-08-10 NOTE — Assessment & Plan Note (Signed)
Avoid offending foods, start probiotics. Do not eat large meals in late evening and consider raising head of bed.  

## 2018-08-10 NOTE — Assessment & Plan Note (Signed)
She requests a prescription for Rogaine, is sent to the pharmacy

## 2018-09-03 ENCOUNTER — Other Ambulatory Visit: Payer: Self-pay

## 2018-09-03 ENCOUNTER — Ambulatory Visit: Payer: Medicare Other | Attending: Family Medicine | Admitting: Physical Therapy

## 2018-09-03 ENCOUNTER — Encounter: Payer: Self-pay | Admitting: Physical Therapy

## 2018-09-03 DIAGNOSIS — G8929 Other chronic pain: Secondary | ICD-10-CM | POA: Diagnosis present

## 2018-09-03 DIAGNOSIS — M25551 Pain in right hip: Secondary | ICD-10-CM | POA: Diagnosis present

## 2018-09-03 DIAGNOSIS — M25552 Pain in left hip: Secondary | ICD-10-CM

## 2018-09-03 DIAGNOSIS — R29898 Other symptoms and signs involving the musculoskeletal system: Secondary | ICD-10-CM | POA: Diagnosis present

## 2018-09-03 DIAGNOSIS — M545 Low back pain: Secondary | ICD-10-CM | POA: Diagnosis not present

## 2018-09-03 NOTE — Therapy (Addendum)
[facility] [address]  [address] [place], [place], [address] Phone: [phone]   Fax:  [phone]  Physical Therapy Treatment  Patient Details  Name: Kristina Lee MRN: [id number] Date of Birth: 1945/12/28 Referring Provider (PT): Norton Blizzard, MD   Progress Note Reporting Period 06/13/18 to 09/03/18  See note below for Objective Data and Assessment of Progress/Goals.     Encounter Date: 09/03/2018  PT End of Session - 09/03/18 1404    Visit Number  19    Number of Visits  27    Date for PT Re-Evaluation  10/10/18    Authorization Type  [employer] Medicare    PT Start Time  1302    PT Stop Time  1352    PT Time Calculation (min)  50 min    Activity Tolerance  Patient tolerated treatment well;Patient limited by pain    Behavior During Therapy  WFL for tasks assessed/performed       Past Medical History:  Diagnosis Date  . Diabetes mellitus without complication ([facility])   . Heart attack ([facility]) 09/07/2017  . Urticaria     Past Surgical History:  Procedure Laterality Date  . ABDOMINAL HYSTERECTOMY  2006   b/l SPO and TAH, uterine cancer  . EYE SURGERY Bilateral 1960  . TUBAL LIGATION      There were no vitals filed for this visit.  Subjective Assessment - 09/03/18 1303    Subjective  Reports that she felt pretty good when she was put on 30 day hold from PT. Recently having increased B LB and buttock pain when standing from prolonged sitting and transitioning from supine to sit. Mentions that LBP sometimes radiates to B sides of mid/upper back. Also c/o intermittent lateral hip pain. Admits that she has not been performing HEP as "I probably should have."  Mentions that last week 4 of her fingers on her L hand became red and turned dark-colored. Started having intense pain, but this has slowly dissipated. Denies any known injury to her hand.    Pertinent History  MI 2019, DM, vascular dementia with behavior  disturbance, CAD, GERD, HLD, hypokalemia, lumbosacral DDD, CKD III, HTN, hx of uterine CA    Limitations  Sitting;Lifting;Standing;Walking;House hold activities    How long can you sit comfortably?  unlimited    How long can you stand comfortably?  atleast an hour    How long can you walk comfortably?  1.5 hours    Diagnostic tests  03/12/18 lumbar xray: There is no acute bony abnormality of the lumbar spine. Mild chronic changes as described.    Patient Stated Goals  decrease pain    Currently in Pain?  Yes    Pain Score  0-No pain    Pain Location  Back    Pain Orientation  Right;Left;Lower;Mid    Pain Descriptors / Indicators  Shooting    Pain Type  Chronic pain    Pain Score  1    Pain Location  Shoulder    Pain Orientation  Left    Pain Descriptors / Indicators  Aching    Pain Type  Chronic pain         [facility] PT Assessment - 09/03/18 0001      Assessment   Medical Diagnosis  R acute LBP, R ITB syndrome    Referring Provider (PT)  Norton Blizzard, MD    Onset Date/Surgical Date  04/29/16      Observation/Other Assessments  Observations  L 3rd digit with darkened appearance over dorsal proximal phalanx, no TTP      AROM   Right Hip Flexion  105    Right Hip External Rotation   28    Right Hip Internal Rotation   17   pulling in R knee   Right Hip ABduction  19    Left Hip Flexion  96    Left Hip External Rotation   29   mild pain   Left Hip Internal Rotation   5    Left Hip ABduction  19    Lumbar Flexion  toes   mild pain in B lateral hips   Lumbar Extension  moderately limited    Lumbar - Right Side Bend  distal thigh   severe R LBP   Lumbar - Left Side Bend  ditstal thigh    Lumbar - Right Rotation  moderately limied   mild pain in R LB   Lumbar - Left Rotation  [facility]      Strength   Right Hip Flexion  4+/5    Right Hip ABduction  4+/5    Right Hip ADduction  4/5    Left Hip ABduction  4+/5    Left Hip ADduction  4+/5    Right Knee Flexion  4+/5    Right  Knee Extension  4+/5    Left Knee Flexion  4/5    Left Knee Extension  4+/5    Right Ankle Dorsiflexion  4+/5    Right Ankle Plantar Flexion  4+/5   15 reps   Left Ankle Dorsiflexion  4+/5    Left Ankle Plantar Flexion  5/5      Flexibility   Hamstrings  B WFL    Quadriceps  severe L hip flexor/proximal quad pain with mod thomas    Piriformis  B mildly tight      Palpation   Palpation comment  very mildly TTP over L hip flexor; loss of muscle tone in B glute med; increased tone over R thoracolumbar paraspinals, B superior glutes, and piriformis                           PT Education - 09/03/18 1403    Education Details  prognosis, POC, HEP; advised patient to F/U with PCP concerning L hand discoloration and pain    Person(s) Educated  Patient    Methods  Explanation;Demonstration;Tactile cues;Verbal cues;Handout    Comprehension  Verbalized understanding;Returned demonstration       PT Short Term Goals - 09/03/18 1405      PT SHORT TERM GOAL #1   Title  Patient to be independent with initial HEP.    Time  3    Period  Weeks    Status  New    Target Date  09/24/18        PT Long Term Goals - 09/03/18 1405      PT LONG TERM GOAL #1   Title  Patient to be independent with advanced HEP.    Time  5    Period  Weeks    Status  New    Target Date  10/10/18      PT LONG TERM GOAL #2   Title  Patient to demonstrate [facility] and pain-free lumbar AROM.    Time  5    Period  Weeks    Status  New    Target Date  10/10/18  PT LONG TERM GOAL #3   Title  Patient to demonstrate B LE strength >=4+/5.     Time  5    Period  Weeks    Status  New    Target Date  10/10/18      PT LONG TERM GOAL #4   Title  Patient to report 50% improvement in pain levels with supine<>sit and STS transfers.     Time  5    Period  Weeks    Status  New    Target Date  10/10/18      PT LONG TERM GOAL #5   Title  Patient to demonstrate [facility] and pain-free B hip AROM.      Time  5    Period  Weeks    Status  New    Target Date  10/10/18            Plan - 09/03/18 1413    Clinical Impression Statement  Patient returns to PT after being placed on 30 day hold d/t improvement in LB and cervical pain, with c/o flare up of LBP and B hip pain. Reports pain along B sides of LB, intermittently radiating to B mid/upper back. Hip pain is on lateral aspect on B sides. Admits to poor compliance with HEP. Aggravating factors include STS and supine to sit transitions. Patient today with limited and painful lumbar and B hip AROM, very mild strength deficits in LEs, pain with flexibility testing of L hip flexor, TTP over L hip flexor, and increased tone over R thoracolumbar paraspinals, B superior glutes, and piriformis. Patient educated on gentle stretching and strengthening HEP and reported understanding. Advised patient to F/U with PCP if L hand symptoms return or worsen. Patient would benefit from continued skilled PT services 2x/week for 4 weeks to address aforementioned impairments.     Clinical Impairments Affecting Rehab Potential  MI 2019, DM, vascular dementia with behavior disturbance, CAD, GERD, HLD, hypokalemia, lumbosacral DDD, CKD III, HTN, hx of uterine CA    PT Frequency  2x / week    PT Duration  4 weeks    PT Treatment/Interventions  ADLs/Self Care Home Management;Cryotherapy;Electrical Stimulation;Functional mobility training;Stair training;Gait training;DME Instruction;Ultrasound;Moist Heat;Therapeutic activities;Therapeutic exercise;Balance training;Neuromuscular re-education;Patient/family education;Passive range of motion;Manual techniques;Dry needling;Energy conservation;Taping;Iontophoresis /ml Dexamethasone    PT Next Visit Plan  reassess HEP    Consulted and Agree with Plan of Care  Patient       Patient will benefit from skilled therapeutic intervention in order to improve the following deficits and impairments:  Decreased range of motion,  Difficulty walking, Increased muscle spasms, Decreased activity tolerance, Pain, Decreased balance, Impaired flexibility, Improper body mechanics, Postural dysfunction, Decreased strength  Visit Diagnosis: Chronic bilateral low back pain without sciatica  Pain in left hip  Pain in right hip  Other symptoms and signs involving the musculoskeletal system     Problem List Patient Active Problem List   Diagnosis Date Noted  . Hair loss 08/10/2018  . Hearing loss 08/10/2018  . Otitis externa 08/10/2018  . Varicose veins of right lower extremity with complications 05/08/2018  . URI (upper respiratory infection) 05/08/2018  . Vascular dementia with behavior disturbance ([facility]) 02/24/2018  . Myalgia 02/09/2018  . Seasonal allergic rhinitis due to pollen 01/21/2018  . Cough 12/23/2017  . Allergic urticaria 12/23/2017  . History of myocardial infarction in last year 12/23/2017  . Diabetes mellitus ([facility]) 12/23/2017  . Rash and nonspecific skin eruption 12/23/2017  . Intrinsic atopic dermatitis 12/23/2017  .  CAD (coronary artery disease) 09/25/2017  . Low back pain 09/25/2017  . Gastroesophageal reflux disease with esophagitis 09/04/2017  . Hyperlipidemia associated with type 2 diabetes mellitus ([facility]) 12/31/2016  . Hypokalemia 10/28/2015  . DDD (degenerative disc disease), lumbosacral 09/13/2015  . Diverticulosis of large intestine 09/13/2015  . Seborrheic dermatitis of scalp 09/13/2015  . Chronic kidney disease, stage III (moderate) ([facility]) 05/02/2015  . Allergic rhinitis 07/14/2014  . Essential hypertension 07/14/2014  . History of uterine cancer 07/14/2014    Anette GuarneriYevgeniya Kovalenko, PT, DPT 09/03/18 2:20 PM    [facility] [place] [address]  [address] [place], [place], [address] Phone: [phone]   Fax:  [phone]  Name: Kristina Lee MRN: [id number] Date of Birth: 10/03/1945

## 2018-09-15 ENCOUNTER — Ambulatory Visit: Payer: Medicare Other | Admitting: Physical Therapy

## 2018-09-16 ENCOUNTER — Ambulatory Visit: Payer: Medicare Other | Attending: Family Medicine | Admitting: Physical Therapy

## 2018-09-16 ENCOUNTER — Encounter: Payer: Self-pay | Admitting: Physical Therapy

## 2018-09-16 ENCOUNTER — Other Ambulatory Visit: Payer: Self-pay

## 2018-09-16 DIAGNOSIS — G8929 Other chronic pain: Secondary | ICD-10-CM | POA: Diagnosis present

## 2018-09-16 DIAGNOSIS — M25551 Pain in right hip: Secondary | ICD-10-CM | POA: Insufficient documentation

## 2018-09-16 DIAGNOSIS — M25552 Pain in left hip: Secondary | ICD-10-CM | POA: Insufficient documentation

## 2018-09-16 DIAGNOSIS — R29898 Other symptoms and signs involving the musculoskeletal system: Secondary | ICD-10-CM

## 2018-09-16 DIAGNOSIS — M545 Low back pain: Secondary | ICD-10-CM | POA: Insufficient documentation

## 2018-09-16 NOTE — Therapy (Signed)
[facility] [address]  [address] [place], [place], [address] Phone: [phone]   Fax:  [phone]  Physical Therapy Treatment  Patient Details  Name: Kristina Lee MRN: [id number] Date of Birth: 1945/09/11 Referring Provider (PT): Norton Blizzard, MD   Encounter Date: 09/16/2018  PT End of Session - 09/16/18 1348    Visit Number  20    Number of Visits  27    Date for PT Re-Evaluation  10/10/18    Authorization Type  [employer] Medicare    PT Start Time  1306    PT Stop Time  1348    PT Time Calculation (min)  42 min    Activity Tolerance  Patient tolerated treatment well;Patient limited by pain    Behavior During Therapy  [facility] for tasks assessed/performed       Past Medical History:  Diagnosis Date  . Diabetes mellitus without complication (HCC)   . Heart attack (HCC) 09/07/2017  . Urticaria     Past Surgical History:  Procedure Laterality Date  . ABDOMINAL HYSTERECTOMY  2006   b/l SPO and TAH, uterine cancer  . EYE SURGERY Bilateral 1960  . TUBAL LIGATION      There were no vitals filed for this visit.  Subjective Assessment - 09/16/18 1309    Subjective  Reports that she has had a busy time lately- did some travelling since her friend in [place] had a stroke. Feels that she had some hip pain and numbness after the drive there and back.     Pertinent History  MI 2019, DM, vascular dementia with behavior disturbance, CAD, GERD, HLD, hypokalemia, lumbosacral DDD, CKD III, HTN, hx of uterine CA    Diagnostic tests  03/12/18 lumbar xray: There is no acute bony abnormality of the lumbar spine. Mild chronic changes as described.    Patient Stated Goals  decrease pain    Currently in Pain?  Yes    Pain Score  2     Pain Location  Hip    Pain Orientation  Right    Pain Descriptors / Indicators  Aching    Pain Type  Chronic pain                       [facility] Adult PT Treatment/Exercise - 09/16/18 0001      Exercises   Exercises  Knee/Hip      Lumbar Exercises: Stretches   Passive Hamstring Stretch  Right;30 seconds;Left;1 rep    Passive Hamstring Stretch Limitations  supine strap    ITB Stretch  Right;Left;1 rep;30 seconds    ITB Stretch Limitations  supine with strap   cues to maintain hips on table   Piriformis Stretch  Right;30 seconds;Left;1 rep    Piriformis Stretch Limitations  KTOS   cues to maintain hip down   Figure 4 Stretch  1 rep;30 seconds;With overpressure    Figure 4 Stretch Limitations  to tolerance      Lumbar Exercises: Aerobic   Nustep  Lvl 3, 6 min LEs only      Lumbar Exercises: Supine   Pelvic Tilt  10 reps    Pelvic Tilt Limitations  ant/pos tilting to tolerance   good tolerance and ROM   Other Supine Lumbar Exercises  LTR x20 to tolerance      Knee/Hip Exercises: Seated   Other Seated Knee/Hip Exercises  sitting hip IR with yellow TB and pillow between knees 2x10  Knee/Hip Exercises: Sidelying   Hip ABduction  Strengthening;Right;Left;1 set;10 reps    Hip ABduction Limitations  good alignment    Hip ADduction  Strengthening;Right;Left;1 set;10 reps    Hip ADduction Limitations  opposite LE propped on bolster; c/o difficulty             PT Education - 09/16/18 1348    Education Details  update to HEP    Person(s) Educated  Patient    Methods  Explanation;Demonstration;Tactile cues;Verbal cues;Handout    Comprehension  Verbalized understanding;Returned demonstration       PT Short Term Goals - 09/16/18 1448      PT SHORT TERM GOAL #1   Title  Patient to be independent with initial HEP.    Time  3    Period  Weeks    Status  On-going    Target Date  09/24/18        PT Long Term Goals - 09/16/18 1448      PT LONG TERM GOAL #1   Title  Patient to be independent with advanced HEP.    Time  5    Period  Weeks    Status  On-going      PT LONG TERM GOAL #2   Title  [facility] to demonstrate [facility] and pain-free lumbar AROM.    Time   5    Period  Weeks    Status  On-going      PT LONG TERM GOAL #3   Title  Patient to demonstrate B LE strength >=4+/5.     Time  5    Period  Weeks    Status  On-going      PT LONG TERM GOAL #4   Title  Patient to report 50% improvement in pain levels with supine<>sit and STS transfers.     Time  5    Period  Weeks    Status  On-going      PT LONG TERM GOAL #5   Title  Patient to demonstrate [facility] and pain-free B hip AROM.     Time  5    Period  Weeks    Status  On-going            Plan - 09/16/18 1348    Clinical Impression Statement  Patient arrived to session with report of B hip pain and numbness after driving to[place]from [place] to visit a friend. Reviewed HEP, with patient requiring intermittent cues to decrease speed and correct form. Patient did demonstrate good understanding and ROM with pelvic tilts today. Visibly with decreased hip AROM with piriformis stretches and decreased hip strength with sidelying hip abduction and adduction. Updated HEP with exercises to address these impairments. Patient reported understanding. Patient still with intermittent c/o hip or LBP with transfers but tolerated all ther-ex well today. Plan to progress towards goals.     Clinical Impairments Affecting Rehab Potential  MI 2019, DM, vascular dementia with behavior disturbance, CAD, GERD, HLD, hypokalemia, lumbosacral DDD, CKD III, HTN, hx of uterine CA    PT Treatment/Interventions  ADLs/Self Care Home Management;Cryotherapy;Electrical Stimulation;Functional mobility training;Stair training;Gait training;DME Instruction;Ultrasound;Moist Heat;Therapeutic activities;Therapeutic exercise;Balance training;Neuromuscular re-education;Patient/family education;Passive range of motion;Manual techniques;Dry needling;Energy conservation;Taping;Iontophoresis 4mg /ml Dexamethasone    PT Next Visit Plan  progress hip strengthening and stretching     Consulted and Agree with Plan of Care  Patient       Patient  will benefit from skilled therapeutic intervention in order to improve the following deficits and impairments:  Decreased range of motion, Difficulty walking, Increased muscle spasms, Decreased activity tolerance, Pain, Decreased balance, Impaired flexibility, Improper body mechanics, Postural dysfunction, Decreased strength  Visit Diagnosis: Chronic bilateral low back pain without sciatica  Pain in left hip  Pain in right hip  Other symptoms and signs involving the musculoskeletal system     Problem List Patient Active Problem List   Diagnosis Date Noted  . Hair loss 08/10/2018  . Hearing loss 08/10/2018  . Otitis externa 08/10/2018  . Varicose veins of right lower extremity with complications 05/08/2018  . URI (upper respiratory infection) 05/08/2018  . Vascular dementia with behavior disturbance (HCC) 02/24/2018  . Myalgia 02/09/2018  . Seasonal allergic rhinitis due to pollen 01/21/2018  . Cough 12/23/2017  . Allergic urticaria 12/23/2017  . History of myocardial infarction in last year 12/23/2017  . Diabetes mellitus (HCC) 12/23/2017  . Rash and nonspecific skin eruption 12/23/2017  . Intrinsic atopic dermatitis 12/23/2017  . CAD (coronary artery disease) 09/25/2017  . Low back pain 09/25/2017  . Gastroesophageal reflux disease with esophagitis 09/04/2017  . Hyperlipidemia associated with type 2 diabetes mellitus (HCC) 12/31/2016  . Hypokalemia 10/28/2015  . DDD (degenerative disc disease), lumbosacral 09/13/2015  . Diverticulosis of large intestine 09/13/2015  . Seborrheic dermatitis of scalp 09/13/2015  . Chronic kidney disease, stage III (moderate) (HCC) 05/02/2015  . Allergic rhinitis 07/14/2014  . Essential hypertension 07/14/2014  . History of uterine cancer 07/14/2014    Anette Guarneri, PT, DPT 09/16/18 2:49 PM    [facility] [address]  [address] [place], [place], [address] Phone:  [phone]   Fax:  [phone]  Name: Kristina Lee MRN: [id number] Date of Birth: 1945/12/18

## 2018-09-18 ENCOUNTER — Other Ambulatory Visit: Payer: Self-pay

## 2018-09-18 ENCOUNTER — Ambulatory Visit: Payer: Medicare Other

## 2018-09-18 DIAGNOSIS — M545 Low back pain: Secondary | ICD-10-CM | POA: Diagnosis not present

## 2018-09-18 DIAGNOSIS — M25551 Pain in right hip: Secondary | ICD-10-CM

## 2018-09-18 DIAGNOSIS — M25552 Pain in left hip: Secondary | ICD-10-CM

## 2018-09-18 DIAGNOSIS — G8929 Other chronic pain: Secondary | ICD-10-CM

## 2018-09-18 DIAGNOSIS — R29898 Other symptoms and signs involving the musculoskeletal system: Secondary | ICD-10-CM

## 2018-09-18 NOTE — Patient Instructions (Signed)

## 2018-09-18 NOTE — Therapy (Signed)
[facility] [address]  [address] [place], [place], [address] Phone: [phone]   Fax:  [phone]  Physical Therapy Treatment  Patient Details  Name: Kristina Lee MRN: [id number] Date of Birth: 01-May-1945 Referring Provider (PT): Norton Blizzard, MD   Encounter Date: 09/18/2018  PT End of Session - 09/18/18 1310    Visit Number  21    Number of Visits  27    Date for PT Re-Evaluation  10/10/18    Authorization Type  [employer] Medicare    PT Start Time  1300    PT Stop Time  1350    PT Time Calculation (min)  50 min    Activity Tolerance  Patient tolerated treatment well;Patient limited by pain    Behavior During Therapy  [facility] for tasks assessed/performed       Past Medical History:  Diagnosis Date  . Diabetes mellitus without complication (HCC)   . Heart attack (HCC) 09/07/2017  . Urticaria     Past Surgical History:  Procedure Laterality Date  . ABDOMINAL HYSTERECTOMY  2006   b/l SPO and TAH, uterine cancer  . EYE SURGERY Bilateral 1960  . TUBAL LIGATION      There were no vitals filed for this visit.  Subjective Assessment - 09/18/18 1309    Subjective  Pt. reporting soreness after last session and after performing HEP.      Pertinent History  MI 2019, DM, vascular dementia with behavior disturbance, CAD, GERD, HLD, hypokalemia, lumbosacral DDD, CKD III, HTN, hx of uterine CA    Diagnostic tests  03/12/18 lumbar xray: There is no acute bony abnormality of the lumbar spine. Mild chronic changes as described.    Patient Stated Goals  decrease pain    Currently in Pain?  Yes    Pain Score  8     Pain Location  Hip    Pain Orientation  Right;Left    Pain Descriptors / Indicators  Sharp;Aching    Pain Type  Chronic pain    Pain Frequency  Intermittent    Multiple Pain Sites  No                       [facility] Adult PT Treatment/Exercise - 09/18/18 0001      Lumbar Exercises: Stretches   Passive Hamstring Stretch  Right;Left;30 seconds;2 reps    Passive Hamstring Stretch Limitations  supine with strap     ITB Stretch  Right;Left;1 rep;30 seconds    ITB Stretch Limitations  supine with strap    Piriformis Stretch  Right;30 seconds;Left;1 rep    Piriformis Stretch Limitations  KTOS    Figure 4 Stretch  1 rep;30 seconds;With overpressure    Figure 4 Stretch Limitations  to tolerance      Knee/Hip Exercises: Stretches   ITB Stretch  Right;Left;2 reps;30 seconds    ITB Stretch Limitations  B strap     Piriformis Stretch  Right;Left;2 reps;30 seconds    Piriformis Stretch Limitations  B KTOS      Knee/Hip Exercises: Supine   Bridges with Ball Squeeze  Both   x 12 resp with adduction ball squeeze   Bridges with Clamshell  Both   with yellow TB isometric hip abd/ER at knees      Knee/Hip Exercises: Sidelying   Hip ABduction  Right;Left;10 reps;Strengthening    Hip ABduction Limitations  pain laying on R hip     Hip ADduction  Right;Left;10 reps;Strengthening    Hip ADduction Limitations  B sidelying       Modalities   Modalities  Iontophoresis      Iontophoresis   Type of Iontophoresis  Dexamethasone    Location  R GT    Dose  1.27mL, 19mA-min    Time  4-6 hour wear time       Manual Therapy   Manual Therapy  Soft tissue mobilization    Manual therapy comments  seated     Soft tissue mobilization  STM to R lateral buttocks - min ttp - very tender over R GT             PT Education - 09/18/18 1358    Education Details  Iontophoresis educational handout with pt. verbalizing understanding of precuations, contraindications and wear time     Person(s) Educated  Patient    Methods  Verbal cues    Comprehension  Verbal cues required       PT Short Term Goals - 09/16/18 1448      PT SHORT TERM GOAL #1   Title  Patient to be independent with initial HEP.    Time  3    Period  Weeks    Status  On-going    Target Date  09/24/18        PT Long Term  Goals - 09/16/18 1448      PT LONG TERM GOAL #1   Title  Patient to be independent with advanced HEP.    Time  5    Period  Weeks    Status  On-going      PT LONG TERM GOAL #2   Title  Patient to demonstrate [facility] and pain-free lumbar AROM.    Time  5    Period  Weeks    Status  On-going      PT LONG TERM GOAL #3   Title  Patient to demonstrate B LE strength >=4+/5.     Time  5    Period  Weeks    Status  On-going      PT LONG TERM GOAL #4   Title  Patient to report 50% improvement in pain levels with supine<>sit and STS transfers.     Time  5    Period  Weeks    Status  On-going      PT LONG TERM GOAL #5   Title  Patient to demonstrate [facility] and pain-free B hip AROM.     Time  5    Period  Weeks    Status  On-going            Plan - 09/18/18 1310    Clinical Impression Statement  Kristina Lee greatest complaint today was R hip pain centralized over R GT with pt. unable to lay on R side during session.  Pt. with point tenderness over R GT with manual therapy today and initiated iontophoresis with educational handout discussed with pt. today.  Ionto order signed by MD. Sherrin Daisy patch #1/6 applied to pt. R GT and performed gentle LE stretching, ITB stretching, and lumbopelvic strengthening therex which was tolerated well.  Pt. requiring frequent cueing with B ITB stretch for proper technique.  Will monitor response to ionto patch in coming session.      Rehab Potential  Good    Clinical Impairments Affecting Rehab Potential  MI 2019, DM, vascular dementia with behavior disturbance, CAD, GERD, HLD, hypokalemia, lumbosacral DDD, CKD III, HTN, hx of uterine  CA    PT Treatment/Interventions  ADLs/Self Care Home Management;Cryotherapy;Electrical Stimulation;Functional mobility training;Stair training;Gait training;DME Instruction;Ultrasound;Moist Heat;Therapeutic activities;Therapeutic exercise;Balance training;Neuromuscular re-education;Patient/family education;Passive range of motion;Manual  techniques;Dry needling;Energy conservation;Taping;Iontophoresis 4mg /ml Dexamethasone    PT Next Visit Plan  progress hip strengthening and stretching     Consulted and Agree with Plan of Care  Patient       Patient will benefit from skilled therapeutic intervention in order to improve the following deficits and impairments:  Decreased range of motion, Difficulty walking, Increased muscle spasms, Decreased activity tolerance, Pain, Decreased balance, Impaired flexibility, Improper body mechanics, Postural dysfunction, Decreased strength  Visit Diagnosis: Chronic bilateral low back pain without sciatica  Pain in left hip  Pain in right hip  Other symptoms and signs involving the musculoskeletal system     Problem List Patient Active Problem List   Diagnosis Date Noted  . Hair loss 08/10/2018  . Hearing loss 08/10/2018  . Otitis externa 08/10/2018  . Varicose veins of right lower extremity with complications 05/08/2018  . URI (upper respiratory infection) 05/08/2018  . Vascular dementia with behavior disturbance (HCC) 02/24/2018  . Myalgia 02/09/2018  . Seasonal allergic rhinitis due to pollen 01/21/2018  . Cough 12/23/2017  . Allergic urticaria 12/23/2017  . History of myocardial infarction in last year 12/23/2017  . Diabetes mellitus (HCC) 12/23/2017  . Rash and nonspecific skin eruption 12/23/2017  . Intrinsic atopic dermatitis 12/23/2017  . CAD (coronary artery disease) 09/25/2017  . Low back pain 09/25/2017  . Gastroesophageal reflux disease with esophagitis 09/04/2017  . Hyperlipidemia associated with type 2 diabetes mellitus (HCC) 12/31/2016  . Hypokalemia 10/28/2015  . DDD (degenerative disc disease), lumbosacral 09/13/2015  . Diverticulosis of large intestine 09/13/2015  . Seborrheic dermatitis of scalp 09/13/2015  . Chronic kidney disease, stage III (moderate) (HCC) 05/02/2015  . Allergic rhinitis 07/14/2014  . Essential hypertension 07/14/2014  . History of  uterine cancer 07/14/2014    Kermit Balo, PTA 09/18/18 2:03 PM   [facility] [address]  [address] [place], [place], [address] Phone: [phone]   Fax:  [phone]  Name: Kristina Lee MRN: [id number] Date of Birth: 06/23/1945

## 2018-09-22 ENCOUNTER — Ambulatory Visit: Payer: Medicare Other

## 2018-09-22 ENCOUNTER — Other Ambulatory Visit: Payer: Self-pay

## 2018-09-22 DIAGNOSIS — G8929 Other chronic pain: Secondary | ICD-10-CM

## 2018-09-22 DIAGNOSIS — M545 Low back pain: Secondary | ICD-10-CM | POA: Diagnosis not present

## 2018-09-22 DIAGNOSIS — R29898 Other symptoms and signs involving the musculoskeletal system: Secondary | ICD-10-CM

## 2018-09-22 DIAGNOSIS — M25551 Pain in right hip: Secondary | ICD-10-CM

## 2018-09-22 DIAGNOSIS — M25552 Pain in left hip: Secondary | ICD-10-CM

## 2018-09-22 NOTE — Therapy (Signed)
[facility] [address]  [place], [place], [address] Phone: [phone]   Fax:  [phone]  Physical Therapy Treatment  Patient Details  Name: Kristina Lee MRN: [id number] Date of Birth: [birth year] Referring Provider (PT): Karlton Lemon, MD   Encounter Date: 09/22/2018  PT End of Session - 09/22/18 1306    Visit Number  22    Number of Visits  27    Date for PT Re-Evaluation  10/10/18    Authorization Type  UHC Medicare    PT Start Time  1259    PT Stop Time  1405    PT Time Calculation (min)  66 min    Activity Tolerance  Patient tolerated treatment well;Patient limited by pain    Behavior During Therapy  [facility] for tasks assessed/performed       Past Medical History:  Diagnosis Date  . Diabetes mellitus without complication ([place])   . Heart attack ([place]) 09/07/2017  . Urticaria     Past Surgical History:  Procedure Laterality Date  . ABDOMINAL HYSTERECTOMY  2006   b/l SPO and TAH, uterine cancer  . EYE SURGERY Bilateral 1960  . TUBAL LIGATION      There were no vitals filed for this visit.  Subjective Assessment - 09/22/18 1305    Subjective  Pt. noting she had LE soreness after last session.      Pertinent History  MI 2019, DM, vascular dementia with behavior disturbance, CAD, GERD, HLD, hypokalemia, lumbosacral DDD, CKD III, HTN, hx of uterine CA    Diagnostic tests  03/12/18 lumbar xray: There is no acute bony abnormality of the lumbar spine. Mild chronic changes as described.    Patient Stated Goals  decrease pain    Currently in Pain?  No/denies    Pain Score  0-No pain   Pt. noting 9/10 pain at times   Pain Location  Hip    Pain Orientation  Right;Left    Pain Descriptors / Indicators  Aching;Sharp    Pain Type  Chronic pain    Pain Frequency  Intermittent    Multiple Pain Sites  No    Pain Score  9    Pain Location  Shoulder    Pain Orientation  Left    Pain Descriptors / Indicators   Aching    Pain Type  Chronic pain                       [facility] Adult PT Treatment/Exercise - 09/22/18 0001      Lumbar Exercises: Stretches   ITB Stretch  Right;Left;1 rep;30 seconds    ITB Stretch Limitations  supine with strap    Piriformis Stretch  Right;30 seconds;Left;1 rep    Piriformis Stretch Limitations  KTOS      Lumbar Exercises: Aerobic   Nustep  Lvl 3, 6 min LEs only      Lumbar Exercises: Supine   Bridge  10 reps;3 seconds      Knee/Hip Exercises: Stretches   Piriformis Stretch  Right;Left;2 reps;30 seconds    Piriformis Stretch Limitations  B KTOS; sitting       Knee/Hip Exercises: Machines for Strengthening   Cybex Knee Flexion  15# 2 x 12 reps       Knee/Hip Exercises: Seated   Sit to Sand  10 reps;without UE support;with UE support   UE support for first 5; isometric hip abd/ER into yellow TB  Modalities   Modalities  Electrical Stimulation;Moist Heat      Moist Heat Therapy   Number Minutes Moist Heat  15 Minutes    Moist Heat Location  Lumbar Spine   and R buttocks sitting      Electrical Stimulation   Electrical Stimulation Location  lumbar and R buttocks     Electrical Stimulation Action  IFC    Electrical Stimulation Parameters  to tolerance, 15'    Electrical Stimulation Goals  Tone;Pain      Manual Therapy   Manual Therapy  Soft tissue mobilization    Manual therapy comments  sidelying with R LE resting on bolster     Soft tissue mobilization  (no longer ttp over R GT today); STM to R lateral buttocks - min ttp      Myofascial Release  TPR to R piriformis; palpable reduction in tone noted following                PT Short Term Goals - 09/16/18 1448      PT SHORT TERM GOAL #1   Title  Patient to be independent with initial HEP.    Time  3    Period  Weeks    Status  On-going    Target Date  09/24/18        PT Long Term Goals - 09/16/18 1448      PT LONG TERM GOAL #1   Title  Patient to be independent  with advanced HEP.    Time  5    Period  Weeks    Status  On-going      PT LONG TERM GOAL #2   Title  Patient to demonstrate [facility] and pain-free lumbar AROM.    Time  5    Period  Weeks    Status  On-going      PT LONG TERM GOAL #3   Title  Patient to demonstrate B LE strength >=4+/5.     Time  5    Period  Weeks    Status  On-going      PT LONG TERM GOAL #4   Title  Patient to report 50% improvement in pain levels with supine<>sit and STS transfers.     Time  5    Period  Weeks    Status  On-going      PT LONG TERM GOAL #5   Title  Patient to demonstrate [facility] and pain-free B hip AROM.     Time  5    Period  Weeks    Status  On-going            Plan - 09/22/18 1307    Clinical Impression Statement  Kristina Lee reporting some relief from ionto patch.  denies skin irritation and removed after 4 hours.  Did reporting increased muscle soreness ("all over") on Saturday which carried into Sunday.  Session focused on gentle proximal hip strengthening and LE stretching with therapist guidance required for appropriate stretch.  MT addressing R buttocks/piriformis tone and tenderness with pt. verbalizing improved comfort with sit<>stand and supine<>sit transitions following.  Reviewed tennis ball self-release to buttocks on wall for use at home.  Ended visit with E-stim/moist heat to lumbar spine and R buttocks for reduction in tone and pain.  Pt. will likely require further skilled instruction in therex and HEP technique prn in coming sessions.      Clinical Impairments Affecting Rehab Potential  MI 2019, DM, vascular dementia with behavior disturbance,  CAD, GERD, HLD, hypokalemia, lumbosacral DDD, CKD III, HTN, hx of uterine CA    PT Treatment/Interventions  ADLs/Self Care Home Management;Cryotherapy;Electrical Stimulation;Functional mobility training;Stair training;Gait training;DME Instruction;Ultrasound;Moist Heat;Therapeutic activities;Therapeutic exercise;Balance training;Neuromuscular  re-education;Patient/family education;Passive range of motion;Manual techniques;Dry needling;Energy conservation;Taping;Iontophoresis 4mg /ml Dexamethasone    PT Next Visit Plan  Monitor tolerance to HEP prn; progress hip strengthening and stretching     Consulted and Agree with Plan of Care  Patient       Patient will benefit from skilled therapeutic intervention in order to improve the following deficits and impairments:  Decreased range of motion, Difficulty walking, Increased muscle spasms, Decreased activity tolerance, Pain, Decreased balance, Impaired flexibility, Improper body mechanics, Postural dysfunction, Decreased strength  Visit Diagnosis: Chronic bilateral low back pain without sciatica  Pain in left hip  Pain in right hip  Other symptoms and signs involving the musculoskeletal system     Problem List Patient Active Problem List   Diagnosis Date Noted  . Hair loss 08/10/2018  . Hearing loss 08/10/2018  . Otitis externa 08/10/2018  . Varicose veins of right lower extremity with complications 05/08/2018  . URI (upper respiratory infection) 05/08/2018  . Vascular dementia with behavior disturbance (HCC) 02/24/2018  . Myalgia 02/09/2018  . Seasonal allergic rhinitis due to pollen 01/21/2018  . Cough 12/23/2017  . Allergic urticaria 12/23/2017  . History of myocardial infarction in last year 12/23/2017  . Diabetes mellitus (HCC) 12/23/2017  . Rash and nonspecific skin eruption 12/23/2017  . Intrinsic atopic dermatitis 12/23/2017  . CAD (coronary artery disease) 09/25/2017  . Low back pain 09/25/2017  . Gastroesophageal reflux disease with esophagitis 09/04/2017  . Hyperlipidemia associated with type 2 diabetes mellitus (HCC) 12/31/2016  . Hypokalemia 10/28/2015  . DDD (degenerative disc disease), lumbosacral 09/13/2015  . Diverticulosis of large intestine 09/13/2015  . Seborrheic dermatitis of scalp 09/13/2015  . Chronic kidney disease, stage III (moderate) (HCC)  05/02/2015  . Allergic rhinitis 07/14/2014  . Essential hypertension 07/14/2014  . History of uterine cancer 07/14/2014    Kermit BaloMicah Jezabel Lecker, PTA 09/22/18 2:56 PM   [facility] [address]  [address] [place], [place], [address] Phone: [phone]   Fax:  [phone]  Name: Kristina Lee MRN: [id number] Date of Birth: 07/24/1945

## 2018-09-24 ENCOUNTER — Telehealth: Payer: Self-pay | Admitting: *Deleted

## 2018-09-24 NOTE — Telephone Encounter (Signed)
A message was left, re: follow up visit. 

## 2018-09-25 ENCOUNTER — Ambulatory Visit: Payer: Medicare Other | Admitting: Physical Therapy

## 2018-09-25 ENCOUNTER — Other Ambulatory Visit: Payer: Self-pay

## 2018-09-25 ENCOUNTER — Encounter: Payer: Self-pay | Admitting: Physical Therapy

## 2018-09-25 DIAGNOSIS — M545 Low back pain, unspecified: Secondary | ICD-10-CM

## 2018-09-25 DIAGNOSIS — G8929 Other chronic pain: Secondary | ICD-10-CM

## 2018-09-25 DIAGNOSIS — M25552 Pain in left hip: Secondary | ICD-10-CM

## 2018-09-25 DIAGNOSIS — M25551 Pain in right hip: Secondary | ICD-10-CM

## 2018-09-25 DIAGNOSIS — R29898 Other symptoms and signs involving the musculoskeletal system: Secondary | ICD-10-CM

## 2018-09-25 NOTE — Therapy (Signed)
[facility] [address]  [address] [place], [place], [address] Phone: [phone]   Fax:  [phone]  Physical Therapy Treatment  Patient Details  Name: Kristina DecampJudith Ann Rolin MRN: [id number] Date of Birth: [birth year] Referring Provider (PT): Norton BlizzardShane Hudnall, MD   Encounter Date: 09/25/2018  PT End of Session - 09/25/18 1348    Visit Number  23    Number of Visits  27    Date for PT Re-Evaluation  10/10/18    Authorization Type  UHC Medicare    PT Start Time  1259    PT Stop Time  1343    PT Time Calculation (min)  44 min    Activity Tolerance  Patient tolerated treatment well;Patient limited by pain    Behavior During Therapy  [facility] for tasks assessed/performed       Past Medical History:  Diagnosis Date  . Diabetes mellitus without complication (HCC)   . Heart attack (HCC) 09/07/2017  . Urticaria     Past Surgical History:  Procedure Laterality Date  . ABDOMINAL HYSTERECTOMY  2006   b/l SPO and TAH, uterine cancer  . EYE SURGERY Bilateral 1960  . TUBAL LIGATION      There were no vitals filed for this visit.  Subjective Assessment - 09/25/18 1257    Subjective  Reports that something hurts all the time but it doesn't hurt for very long. The last couple of days she has been having pain in R lateral shoulder.    Pertinent History  MI 2019, DM, vascular dementia with behavior disturbance, CAD, GERD, HLD, hypokalemia, lumbosacral DDD, CKD III, HTN, hx of uterine CA    Diagnostic tests  03/12/18 lumbar xray: There is no acute bony abnormality of the lumbar spine. Mild chronic changes as described.    Patient Stated Goals  decrease pain    Currently in Pain?  No/denies                       [facility]/Ex[facility] - 09/25/18 0001      Lumbar Exercises: Aerobic   Nustep  Lvl 3, 6 min LEs only      Knee/Hip Exercises: Stretches   ITB Stretch  Right;Left;2 reps;30 seconds    ITB Stretch  Limitations  B strap    unable to tolerate full 30 sec on L d/t pain   Piriformis Stretch  Right;Left;30 seconds;1 rep    Piriformis Stretch Limitations  B KTOS; supine       Knee/Hip Exercises: Supine   Bridges with Clamshell  Strengthening;Both;1 set;15 reps   red TB above knees     Knee/Hip Exercises: Sidelying   Hip ABduction  Right;Left;10 reps;Strengthening    Hip ABduction Limitations  cues for alignment    Hip ADduction  Right;Left;10 reps;Strengthening    Hip ADduction Limitations  opposite LE propped on bolster; c/o difficulty    Clams  with red TB   manual cues to maintain hips forward     Knee/Hip Exercises: Prone   Hip Extension  Strengthening;Right;Left;1 set;10 reps    Hip Extension Limitations  with straight knee   increased dificulty on L   Other Prone Exercises  prone B hip IR with yellow TB x10      Manual Therapy   Manual Therapy  Soft tissue mobilization    Manual therapy comments  R & L sidelying with opposite LE resting on bolster     Soft  tissue mobilization  STM to R & L superior glute and piriformis    Myofascial Release  TPR to R & L superior glute and piriformis   more tender R>L              PT Short Term Goals - 09/25/18 1350      PT SHORT TERM GOAL #1   Title  Patient to be independent with initial HEP.    Time  3    Period  Weeks    Status  Achieved    Target Date  09/24/18        PT Long Term Goals - 09/16/18 1448      PT LONG TERM GOAL #1   Title  Patient to be independent with advanced HEP.    Time  5    Period  Weeks    Status  On-going      PT LONG TERM GOAL #2   Title  Patient to demonstrate [facility] and pain-free lumbar AROM.    Time  5    Period  Weeks    Status  On-going      PT LONG TERM GOAL #3   Title  Patient to demonstrate B LE strength >=4+/5.     Time  5    Period  Weeks    Status  On-going      PT LONG TERM GOAL #4   Title  Patient to report 50% improvement in pain levels with supine<>sit and STS  transfers.     Time  5    Period  Weeks    Status  On-going      PT LONG TERM GOAL #5   Title  Patient to demonstrate [facility] and pain-free B hip AROM.     Time  5    Period  Weeks    Status  On-going            Plan - 09/25/18 1348    Clinical Impression Statement  Patient arrived to session with report of increased R deltoid pain over the last few days without known cause. Tolerated STM and manual TPR to B piriformis and superior glute- patient with tenderness and palpable trigger points in these areas, R>L. Tolerated addition of banded resistance to bridges today without c/o pain. Introduced Engineer, agricultural with increased resistance as well- patient reporting difficulty and requiring manual assistance to maintain hips rolled forward. Initiated prone hip IR with resistance- patient with very limited ROM and difficulty. Tolerated all ther-ex well today. Patient without complaints at end of session.    Clinical Impairments Affecting Rehab Potential  MI 2019, DM, vascular dementia with behavior disturbance, CAD, GERD, HLD, hypokalemia, lumbosacral DDD, CKD III, HTN, hx of uterine CA    PT Treatment/Interventions  ADLs/Self Care Home Management;Cryotherapy;Electrical Stimulation;Functional mobility training;Stair training;Gait training;DME Instruction;Ultrasound;Moist Heat;Therapeutic activities;Therapeutic exercise;Balance training;Neuromuscular re-education;Patient/family education;Passive range of motion;Manual techniques;Dry needling;Energy conservation;Taping;Iontophoresis 4mg /ml Dexamethasone    PT Next Visit Plan  Monitor tolerance to HEP prn; progress hip strengthening and stretching     Consulted and Agree with Plan of Care  Patient       Patient will benefit from skilled therapeutic intervention in order to improve the following deficits and impairments:  Decreased range of motion, Difficulty walking, Increased muscle spasms, Decreased activity tolerance, Pain, Decreased balance, Impaired  flexibility, Improper body mechanics, Postural dysfunction, Decreased strength  Visit Diagnosis: Chronic bilateral low back pain without sciatica - Plan:   Pain in left hip - Plan:   Pain  in right hip - Plan:   Other symptoms and signs involving the musculoskeletal system - Plan:      Problem List Patient Active Problem List   Diagnosis Date Noted  . Hair loss 08/10/2018  . Hearing loss 08/10/2018  . Otitis externa 08/10/2018  . Varicose veins of right lower extremity with complications 20/94/7096  . URI (upper respiratory infection) 05/08/2018  . Vascular dementia with behavior disturbance ([place]) 02/24/2018  . Myalgia 02/09/2018  . Seasonal allergic rhinitis due to pollen 01/21/2018  . Cough 12/23/2017  . Allergic urticaria 12/23/2017  . History of myocardial infarction in last year 12/23/2017  . Diabetes mellitus ([place]) 12/23/2017  . Rash and nonspecific skin eruption 12/23/2017  . Intrinsic atopic dermatitis 12/23/2017  . CAD (coronary artery disease) 09/25/2017  . Low back pain 09/25/2017  . Gastroesophageal reflux disease with esophagitis 09/04/2017  . Hyperlipidemia associated with type 2 diabetes mellitus ([place]) 12/31/2016  . Hypokalemia 10/28/2015  . DDD (degenerative disc disease), lumbosacral 09/13/2015  . Diverticulosis of large intestine 09/13/2015  . Seborrheic dermatitis of scalp 09/13/2015  . Chronic kidney disease, stage III (moderate) ([place]) 05/02/2015  . Allergic rhinitis 07/14/2014  . Essential hypertension 07/14/2014  . History of uterine cancer 07/14/2014    Janene Harvey, PT, DPT 09/25/18 1:52 PM   [facility] [address]  [place], [place], [address] Phone: [phone]   Fax:  [phone]  Name: Shameeka Silliman MRN: [id number] Date of Birth: 12/18/1945

## 2018-09-29 ENCOUNTER — Ambulatory Visit: Payer: Medicare Other

## 2018-09-29 ENCOUNTER — Other Ambulatory Visit: Payer: Self-pay

## 2018-09-29 DIAGNOSIS — R29898 Other symptoms and signs involving the musculoskeletal system: Secondary | ICD-10-CM

## 2018-09-29 DIAGNOSIS — G8929 Other chronic pain: Secondary | ICD-10-CM

## 2018-09-29 DIAGNOSIS — M545 Low back pain: Secondary | ICD-10-CM | POA: Diagnosis not present

## 2018-09-29 DIAGNOSIS — M25552 Pain in left hip: Secondary | ICD-10-CM

## 2018-09-29 DIAGNOSIS — M25551 Pain in right hip: Secondary | ICD-10-CM

## 2018-09-29 NOTE — Therapy (Signed)
[facility] [address]  [place], [place], [address] Phone: [phone]   Fax:  [phone]  Physical Therapy Treatment  Patient Details  Name: Kristina Lee MRN: [id number] Date of Birth: September 23, 1945 Referring Provider (PT): Karlton Lemon, MD   Encounter Date: 09/29/2018  PT End of Session - 09/29/18 1317    Visit Number  24    Number of Visits  27    Date for PT Re-Evaluation  10/10/18    Authorization Type  UHC Medicare    PT Start Time  1300    PT Stop Time  1349    PT Time Calculation (min)  49 min    Activity Tolerance  Patient tolerated treatment well    Behavior During Therapy  [facility] for tasks assessed/performed       Past Medical History:  Diagnosis Date  . Diabetes mellitus without complication ([place])   . Heart attack ([place]) 09/07/2017  . Urticaria     Past Surgical History:  Procedure Laterality Date  . ABDOMINAL HYSTERECTOMY  2006   b/l SPO and TAH, uterine cancer  . EYE SURGERY Bilateral 1960  . TUBAL LIGATION      There were no vitals filed for this visit.  Subjective Assessment - 09/29/18 1303    Subjective  Pt. noting ~ 85% improvement in pain "this time around" since starting back with therapy.    Pertinent History  MI 2019, DM, vascular dementia with behavior disturbance, CAD, GERD, HLD, hypokalemia, lumbosacral DDD, CKD III, HTN, hx of uterine CA    Diagnostic tests  03/12/18 lumbar xray: There is no acute bony abnormality of the lumbar spine. Mild chronic changes as described.    Patient Stated Goals  decrease pain    Currently in Pain?  Yes    Pain Score  0-No pain    Pain Location  Hip    Pain Orientation  Right;Left    Pain Frequency  Intermittent    Aggravating Factors   mornings                       [facility] Adult PT Treatment/Exercise - 09/29/18 0001      Lumbar Exercises: Aerobic   Recumbent Bike  Lvl 2, 6 min     Nustep  --      Lumbar Exercises:  Machines for Strengthening   Other Lumbar Machine Exercise  B low row 10# x 15 reps; cues for scapular retraction       Lumbar Exercises: Standing   Other Standing Lumbar Exercises  Standing lumbar extension 3" x 10 reps; pain free    Other Standing Lumbar Exercises  Alternating standing lumbar rotation R and L 3" x 10 reps each way       Knee/Hip Exercises: Stretches   Piriformis Stretch  Right;Left;30 seconds;1 rep    Piriformis Stretch Limitations  B KTOS; supine       Knee/Hip Exercises: Standing   Other Standing Knee Exercises  Alternating glute kickbacks leaning over peanut p-ball and UE support on bolster on mat table x 10 reps       Knee/Hip Exercises: Supine   Bridges with Ball Squeeze  Both;15 reps      Knee/Hip Exercises: Sidelying   Hip ABduction  Right;Left;10 reps;Strengthening    Hip ABduction Limitations  cues for alignment; Pt. noting improved glute activation following positional adjustment  PT Short Term Goals - 09/25/18 1350      PT SHORT TERM GOAL #1   Title  Patient to be independent with initial HEP.    Time  3    Period  Weeks    Status  Achieved    Target Date  09/24/18        PT Long Term Goals - 09/29/18 1328      PT LONG TERM GOAL #1   Title  Patient to be independent with advanced HEP.    Time  5    Period  Weeks    Status  On-going      PT LONG TERM GOAL #2   Title  Patient to demonstrate [facility] and pain-free lumbar AROM.    Time  5    Period  Weeks    Status  On-going      PT LONG TERM GOAL #3   Title  Patient to demonstrate B LE strength >=4+/5.     Time  5    Period  Weeks    Status  On-going      PT LONG TERM GOAL #4   Title  Patient to report 50% improvement in pain levels with supine<>sit and STS transfers.     Time  5    Period  Weeks    Status  Achieved   09/29/18: notes 85% improvement     PT LONG TERM GOAL #5   Title  Patient to demonstrate [facility] and pain-free B hip AROM.     Time  5    Period   Weeks    Status  On-going            Plan - 09/29/18 1319    Clinical Impression Statement  Kristina Lee doing well today noting improved pain levels over weekend.  Notes ~ 85% pain improvement overall and with transition from supine>sit, STS since starting therapy.  Tolerated progression of lumbar rotation and extension ROM activities well today.  Continued focus on proximal hip strengthening in session without issue.  Kristina Lee does still require frequent cueing for proper technique with therex.  Ended session with pt. noting no pain and modalities deferred.  Pt. has now met LTG # 4.    Rehab Potential  Good    Clinical Impairments Affecting Rehab Potential  MI 2019, DM, vascular dementia with behavior disturbance, CAD, GERD, HLD, hypokalemia, lumbosacral DDD, CKD III, HTN, hx of uterine CA    PT Treatment/Interventions  ADLs/Self Care Home Management;Cryotherapy;Electrical Stimulation;Functional mobility training;Stair training;Gait training;DME Instruction;Ultrasound;Moist Heat;Therapeutic activities;Therapeutic exercise;Balance training;Neuromuscular re-education;Patient/family education;Passive range of motion;Manual techniques;Dry needling;Energy conservation;Taping;Iontophoresis 21m/ml Dexamethasone    PT Next Visit Plan  Monitor tolerance to HEP prn; progress hip strengthening and stretching     Consulted and Agree with Plan of Care  Patient       Patient will benefit from skilled therapeutic intervention in order to improve the following deficits and impairments:  Decreased range of motion, Difficulty walking, Increased muscle spasms, Decreased activity tolerance, Pain, Decreased balance, Impaired flexibility, Improper body mechanics, Postural dysfunction, Decreased strength  Visit Diagnosis: Chronic bilateral low back pain without sciatica - Plan:   Pain in left hip - Plan:   Pain in right hip - Plan:   Other symptoms and signs involving the musculoskeletal system - Plan:       Problem List Patient Active Problem List   Diagnosis Date Noted  . Hair loss 08/10/2018  . Hearing loss 08/10/2018  . Otitis externa 08/10/2018  .  Varicose veins of right lower extremity with complications 23/36/1224  . URI (upper respiratory infection) 05/08/2018  . Vascular dementia with behavior disturbance ([place]) 02/24/2018  . Myalgia 02/09/2018  . Seasonal allergic rhinitis due to pollen 01/21/2018  . Cough 12/23/2017  . Allergic urticaria 12/23/2017  . History of myocardial infarction in last year 12/23/2017  . Diabetes mellitus ([place]) 12/23/2017  . Rash and nonspecific skin eruption 12/23/2017  . Intrinsic atopic dermatitis 12/23/2017  . CAD (coronary artery disease) 09/25/2017  . Low back pain 09/25/2017  . Gastroesophageal reflux disease with esophagitis 09/04/2017  . Hyperlipidemia associated with type 2 diabetes mellitus ([place]) 12/31/2016  . Hypokalemia 10/28/2015  . DDD (degenerative disc disease), lumbosacral 09/13/2015  . Diverticulosis of large intestine 09/13/2015  . Seborrheic dermatitis of scalp 09/13/2015  . Chronic kidney disease, stage III (moderate) ([place]) 05/02/2015  . Allergic rhinitis 07/14/2014  . Essential hypertension 07/14/2014  . History of uterine cancer 07/14/2014    Bess Harvest, PTA 09/29/18 1:57 PM   [facility] [address]  [place], [place], [address] Phone: [phone]   Fax:  [phone]  Name: Kristina Lee MRN: [id number] Date of Birth: Feb 22, 1946

## 2018-10-02 ENCOUNTER — Other Ambulatory Visit: Payer: Self-pay

## 2018-10-02 ENCOUNTER — Ambulatory Visit: Payer: Medicare Other

## 2018-10-02 DIAGNOSIS — M25551 Pain in right hip: Secondary | ICD-10-CM

## 2018-10-02 DIAGNOSIS — M545 Low back pain: Secondary | ICD-10-CM | POA: Diagnosis not present

## 2018-10-02 DIAGNOSIS — R29898 Other symptoms and signs involving the musculoskeletal system: Secondary | ICD-10-CM

## 2018-10-02 DIAGNOSIS — G8929 Other chronic pain: Secondary | ICD-10-CM

## 2018-10-02 DIAGNOSIS — M25552 Pain in left hip: Secondary | ICD-10-CM

## 2018-10-02 NOTE — Therapy (Signed)
[facility] [place] [address]  [address] [place], [place], [address] Phone: [phone]   Fax:  [phone]  Physical Therapy Treatment  Patient Details  Name: Kristina DecampJudith Ann Cassel MRN: [id number] Date of Birth: 27-May-1945 Referring Provider (PT): Norton BlizzardShane Hudnall, MD   Encounter Date: 10/02/2018  PT End of Session - 10/02/18 1303    Visit Number  25    Number of Visits  27    Date for PT Re-Evaluation  10/10/18    Authorization Type  UHC Medicare    PT Start Time  1300    PT Stop Time  1353    PT Time Calculation (min)  53 min    Activity Tolerance  Patient tolerated treatment well    Behavior During Therapy  [facility] for tasks assessed/performed       Past Medical History:  Diagnosis Date  . Diabetes mellitus without complication (HCC)   . Heart attack (HCC) 09/07/2017  . Urticaria     Past Surgical History:  Procedure Laterality Date  . ABDOMINAL HYSTERECTOMY  2006   b/l SPO and TAH, uterine cancer  . EYE SURGERY Bilateral 1960  . TUBAL LIGATION      There were no vitals filed for this visit.  Subjective Assessment - 10/02/18 1303    Subjective  Pt. reporting she just got back from a long car ride and lots of walking over last few days and is sore "everywhere".    Pertinent History  MI 2019, DM, vascular dementia with behavior disturbance, CAD, GERD, HLD, hypokalemia, lumbosacral DDD, CKD III, HTN, hx of uterine CA    Diagnostic tests  03/12/18 lumbar xray: There is no acute bony abnormality of the lumbar spine. Mild chronic changes as described.    Patient Stated Goals  decrease pain    Currently in Pain?  Yes    Pain Score  3     Pain Location  Hip    Pain Orientation  Right;Left    Pain Descriptors / Indicators  Aching;Sharp    Pain Type  Chronic pain    Pain Radiating Towards  radiating down L lateral LE    Pain Frequency  Intermittent    Aggravating Factors   prolonged walking and [place]" [place] [employer]                       [facility] Adult PT Treatment/Exercise - 10/02/18 0001      Self-Care   Self-Care  Other Self-Care Comments    Other Self-Care Comments   Lengthy discussion of pt. plans to [facility] and to encourage community fitness program       Lumbar Exercises: Stretches   Passive Hamstring Stretch  Right;Left;1 rep;30 seconds    Passive Hamstring Stretch Limitations  strap    Single Knee to Chest Stretch  Right;1 rep;30 seconds    Single Knee to Chest Stretch Limitations  B    Lower Trunk Rotation  --   5" x 10 reps   Piriformis Stretch  Right;30 seconds;Left;1 rep    Piriformis Stretch Limitations  KTOS      Lumbar Exercises: Aerobic   Recumbent Bike  Lvl 2, 3 min    terminated back after pt. noting leg pain    Nustep  Lvl 3, 3 min LEs only   improved tolerance over recumbent bike     Lumbar Exercises: Supine   Ab Set  15 reps;5 seconds    AB Set Limitations  + adduction ball    Clam  --   x 12 reps;  alternating hip abd/ER into red TB    Clam Limitations  red TB     Bridge  --   3" x 12 reps      Moist Heat Therapy   Number Minutes Moist Heat  15 Minutes    Moist Heat Location  Lumbar Spine      Electrical Stimulation   Electrical Stimulation Location  lumbar spine     Electrical Stimulation Action  IFC    Electrical Stimulation Parameters  to tolerance, 15'    Electrical Stimulation Goals  Tone               PT Short Term Goals - 09/25/18 1350      PT SHORT TERM GOAL #1   Title  Patient to be independent with initial HEP.    Time  3    Period  Weeks    Status  Achieved    Target Date  09/24/18        PT Long Term Goals - 09/29/18 1328      PT LONG TERM GOAL #1   Title  Patient to be independent with advanced HEP.    Time  5    Period  Weeks    Status  On-going      PT LONG TERM GOAL #2   Title  Patient to demonstrate [facility] and pain-free lumbar AROM.    Time  5    Period  Weeks     Status  On-going      PT LONG TERM GOAL #3   Title  Patient to demonstrate B LE strength >=4+/5.     Time  5    Period  Weeks    Status  On-going      PT LONG TERM GOAL #4   Title  Patient to report 50% improvement in pain levels with supine<>sit and STS transfers.     Time  5    Period  Weeks    Status  Achieved   09/29/18: notes 85% improvement     PT LONG TERM GOAL #5   Title  Patient to demonstrate [facility] and pain-free B hip AROM.     Time  5    Period  Weeks    Status  On-going            Plan - 10/02/18 1315    Clinical Impression Statement  Pt. reporting increased LBP, B hip pain and overall stiffness after returning yesterday from trip to [employer] with prolonged walking, stair climbing, and long car ride.  Pain levels limiting therex today thus focused on gentle LE flexibility, lumbar ROM, and ended visit with E-stim/moist heat application to lumbar spine.  Some focuse today on gentle lumbopelvic strengthening activities for improved lumbar control with ADLs.  Lengthy discussion to end session today with pt. to encourage her toward ongoing community fitness program as pt. has enjoyed water aerobics at [facility] in past.  Pt. verbalized understanding for ongoing need for fitness program following therapy as she approaches end of current POC.    Clinical Impairments Affecting Rehab Potential  MI 2019, DM, vascular dementia with behavior disturbance, CAD, GERD, HLD, hypokalemia, lumbosacral DDD, CKD III, HTN, hx of uterine CA    PT Treatment/Interventions  ADLs/Self Care Home Management;Cryotherapy;Electrical Stimulation;Functional mobility training;Stair training;Gait training;DME Instruction;Ultrasound;Moist Heat;Therapeutic activities;Therapeutic exercise;Balance training;Neuromuscular re-education;Patient/family education;Passive  range of motion;Manual techniques;Dry needling;Energy conservation;Taping;Iontophoresis 4mg /ml Dexamethasone    PT Next Visit Plan  Monitor  tolerance to HEP prn; progress hip strengthening and stretching     Consulted and Agree with Plan of Care  Patient       Patient will benefit from skilled therapeutic intervention in order to improve the following deficits and impairments:  Decreased range of motion, Difficulty walking, Increased muscle spasms, Decreased activity tolerance, Pain, Decreased balance, Impaired flexibility, Improper body mechanics, Postural dysfunction, Decreased strength  Visit Diagnosis: 1. Chronic bilateral low back pain without sciatica   2. Pain in left hip   3. Pain in right hip   4. Other symptoms and signs involving the musculoskeletal system        Problem List Patient Active Problem List   Diagnosis Date Noted  . Hair loss 08/10/2018  . Hearing loss 08/10/2018  . Otitis externa 08/10/2018  . Varicose veins of right lower extremity with complications 25/08/3974  . URI (upper respiratory infection) 05/08/2018  . Vascular dementia with behavior disturbance ([place]) 02/24/2018  . Myalgia 02/09/2018  . Seasonal allergic rhinitis due to pollen 01/21/2018  . Cough 12/23/2017  . Allergic urticaria 12/23/2017  . History of myocardial infarction in last year 12/23/2017  . Diabetes mellitus ([place]) 12/23/2017  . Rash and nonspecific skin eruption 12/23/2017  . Intrinsic atopic dermatitis 12/23/2017  . CAD (coronary artery disease) 09/25/2017  . Low back pain 09/25/2017  . Gastroesophageal reflux disease with esophagitis 09/04/2017  . Hyperlipidemia associated with type 2 diabetes mellitus ([place]) 12/31/2016  . Hypokalemia 10/28/2015  . DDD (degenerative disc disease), lumbosacral 09/13/2015  . Diverticulosis of large intestine 09/13/2015  . Seborrheic dermatitis of scalp 09/13/2015  . Chronic kidney disease, stage III (moderate) ([place]) 05/02/2015  . Allergic rhinitis 07/14/2014  . Essential hypertension 07/14/2014  . History of uterine cancer 07/14/2014    Bess Harvest, PTA 10/02/18 5:19  PM    [place] [address]  [place], [place], [address] Phone: [phone]   Fax:  [phone]  Name: Rendy Lazard MRN: [id number] Date of Birth: [birth year]

## 2018-10-06 ENCOUNTER — Other Ambulatory Visit: Payer: Self-pay

## 2018-10-06 ENCOUNTER — Encounter: Payer: Self-pay | Admitting: Physical Therapy

## 2018-10-06 ENCOUNTER — Ambulatory Visit: Payer: Medicare Other | Admitting: Physical Therapy

## 2018-10-06 VITALS — BP 132/60 | HR 73

## 2018-10-06 DIAGNOSIS — M25551 Pain in right hip: Secondary | ICD-10-CM

## 2018-10-06 DIAGNOSIS — M545 Low back pain: Secondary | ICD-10-CM | POA: Diagnosis not present

## 2018-10-06 DIAGNOSIS — R29898 Other symptoms and signs involving the musculoskeletal system: Secondary | ICD-10-CM

## 2018-10-06 DIAGNOSIS — G8929 Other chronic pain: Secondary | ICD-10-CM

## 2018-10-06 DIAGNOSIS — M25552 Pain in left hip: Secondary | ICD-10-CM

## 2018-10-06 NOTE — Therapy (Signed)
[facility] [place] [address]  [place], [place], [address] Phone: [phone]   Fax:  [phone]  Physical Therapy Treatment  Patient Details  Name: Kristina Lee MRN: [id number] Date of Birth: April 27, 1945 Referring Provider (PT): Karlton Lemon, MD   Encounter Date: 10/06/2018  PT End of Session - 10/06/18 1354    Visit Number  26    Number of Visits  27    Date for PT Re-Evaluation  10/10/18    Authorization Type  UHC Medicare    PT Start Time  1302    PT Stop Time  1348    PT Time Calculation (min)  46 min    Activity Tolerance  Patient tolerated treatment well;Patient limited by pain    Behavior During Therapy  [facility] for tasks assessed/performed       Past Medical History:  Diagnosis Date  . Diabetes mellitus without complication ([place])   . Heart attack ([place]) 09/07/2017  . Urticaria     Past Surgical History:  Procedure Laterality Date  . ABDOMINAL HYSTERECTOMY  2006   b/l SPO and TAH, uterine cancer  . EYE SURGERY Bilateral 1960  . TUBAL LIGATION      Vitals:   10/06/18 1303  BP: 132/60  Pulse: 73  SpO2: 97%    Subjective Assessment - 10/06/18 1305    Subjective  Reports that she has been hurting ever since going to [place]. Not as bad as it was.    Pertinent History  MI 2019, DM, vascular dementia with behavior disturbance, CAD, GERD, HLD, hypokalemia, lumbosacral DDD, CKD III, HTN, hx of uterine CA    Diagnostic tests  03/12/18 lumbar xray: There is no acute bony abnormality of the lumbar spine. Mild chronic changes as described.    Patient Stated Goals  decrease pain    Currently in Pain?  Yes    Pain Score  8     Pain Location  Hip    Pain Orientation  Right;Left    Pain Descriptors / Indicators  Aching    Pain Type  Chronic pain         [facility] PT Assessment - 10/06/18 0001      Strength   Right Hip Flexion  4+/5    Right Hip ABduction  4+/5    Right Hip ADduction  4/5    Left Hip  ABduction  4+/5    Left Hip ADduction  4+/5    Right Knee Flexion  4+/5    Right Knee Extension  4+/5    Left Knee Flexion  4/5    Left Knee Extension  4+/5    Right Ankle Dorsiflexion  4+/5    Right Ankle Plantar Flexion  4+/5    Left Ankle Dorsiflexion  4+/5    Left Ankle Plantar Flexion  5/5                   [facility] Adult PT Treatment/Exercise - 10/06/18 0001      Lumbar Exercises: Aerobic   Nustep  L4 x 85min (LEs only)      Knee/Hip Exercises: Stretches   Piriformis Stretch  Right;Left;30 seconds;2 reps    Piriformis Stretch Limitations  B KTOS; sitting     Other Knee/Hip Stretches  R & L figure 4 stretch sitting 2x30"      Knee/Hip Exercises: Supine   Bridges with Clamshell  Strengthening;Both;1 set;15 reps   c/o low back tenderness which eased w/  cues for core contrac   Single Leg Bridge  Strengthening;Both;1 set;10 reps   marching bridge     Shoulder Exercises: Seated   Other Seated Exercises  sitting B hip ER with red TB x15; hip IR with yellow TB 2x10      Manual Therapy   Manual Therapy  Soft tissue mobilization;Myofascial release    Manual therapy comments  R sidelying    Soft tissue mobilization  STM to L piriformis and superior glutes- TTP and increased tone throughout    Myofascial Release  TPR to L superior glute and piriformis             PT Education - 10/06/18 1339    Education Details  edu on use of TENS, precautions, contraindications, edu benefits of aquatic therapy    Person(s) Educated  Patient    Methods  Explanation;Demonstration;Tactile cues;Verbal cues    Comprehension  Verbalized understanding       PT Short Term Goals - 09/25/18 1350      PT SHORT TERM GOAL #1   Title  Patient to be independent with initial HEP.    Time  3    Period  Weeks    Status  Achieved    Target Date  09/24/18        PT Long Term Goals - 09/29/18 1328      PT LONG TERM GOAL #1   Title  Patient to be independent with advanced HEP.     Time  5    Period  Weeks    Status  On-going      PT LONG TERM GOAL #2   Title  Patient to demonstrate [facility] and pain-free lumbar AROM.    Time  5    Period  Weeks    Status  On-going      PT LONG TERM GOAL #3   Title  Patient to demonstrate B LE strength >=4+/5.     Time  5    Period  Weeks    Status  On-going      PT LONG TERM GOAL #4   Title  Patient to report 50% improvement in pain levels with supine<>sit and STS transfers.     Time  5    Period  Weeks    Status  Achieved   09/29/18: notes 85% improvement     PT LONG TERM GOAL #5   Title  Patient to demonstrate [facility] and pain-free B hip AROM.     Time  5    Period  Weeks    Status  On-going            Plan - 10/06/18 1354    Clinical Impression Statement  Patient arrived to session with report of continued B hip soreness after going to visit the [place] last week. Began session with gentle hip stretching- patient with most difficulty stretching into hip ER with figure 4 stretch. Followed with sitting hip strengthening with light banded resistance which patient tolerated well. Patient reporting low back tenderness with bridges which eased with cues for core contraction. Introduced Doctor, hospital with patient showing good hip extension ROM and no pain. Ended session with STM and manual TPR to L glute and piriformis- patient still with soft tissue restriction and pain in these areas. Reported relief of pain in L hip after manual therapy, remaining pain on R with transfers. Took time to discuss aquatic therapy and use of personal TENS unit for pain relief. Believe patient  would benefit from TENS unity for home use d/t chronicity of pain and decreased LE strength. (See measurements carried over from last eval).    Clinical Impairments Affecting Rehab Potential  MI 2019, DM, vascular dementia with behavior disturbance, CAD, GERD, HLD, hypokalemia, lumbosacral DDD, CKD III, HTN, hx of uterine CA    PT Treatment/Interventions   ADLs/Self Care Home Management;Cryotherapy;Electrical Stimulation;Functional mobility training;Stair training;Gait training;DME Instruction;Ultrasound;Moist Heat;Therapeutic activities;Therapeutic exercise;Balance training;Neuromuscular re-education;Patient/family education;Passive range of motion;Manual techniques;Dry needling;Energy conservation;Taping;Iontophoresis 4mg /ml Dexamethasone    PT Next Visit Plan  Monitor tolerance to HEP prn; progress hip strengthening and stretching     Consulted and Agree with Plan of Care  Patient       Patient will benefit from skilled therapeutic intervention in order to improve the following deficits and impairments:  Decreased range of motion, Difficulty walking, Increased muscle spasms, Decreased activity tolerance, Pain, Decreased balance, Impaired flexibility, Improper body mechanics, Postural dysfunction, Decreased strength  Visit Diagnosis: 1. Chronic bilateral low back pain without sciatica   2. Pain in left hip   3. Pain in right hip   4. Other symptoms and signs involving the musculoskeletal system        Problem List Patient Active Problem List   Diagnosis Date Noted  . Hair loss 08/10/2018  . Hearing loss 08/10/2018  . Otitis externa 08/10/2018  . Varicose veins of right lower extremity with complications 05/08/2018  . URI (upper respiratory infection) 05/08/2018  . Vascular dementia with behavior disturbance (HCC) 02/24/2018  . Myalgia 02/09/2018  . Seasonal allergic rhinitis due to pollen 01/21/2018  . Cough 12/23/2017  . Allergic urticaria 12/23/2017  . History of myocardial infarction in last year 12/23/2017  . Diabetes mellitus (HCC) 12/23/2017  . Rash and nonspecific skin eruption 12/23/2017  . Intrinsic atopic dermatitis 12/23/2017  . CAD (coronary artery disease) 09/25/2017  . Low back pain 09/25/2017  . Gastroesophageal reflux disease with esophagitis 09/04/2017  . Hyperlipidemia associated with type 2 diabetes mellitus  (HCC) 12/31/2016  . Hypokalemia 10/28/2015  . DDD (degenerative disc disease), lumbosacral 09/13/2015  . Diverticulosis of large intestine 09/13/2015  . Seborrheic dermatitis of scalp 09/13/2015  . Chronic kidney disease, stage III (moderate) (HCC) 05/02/2015  . Allergic rhinitis 07/14/2014  . Essential hypertension 07/14/2014  . History of uterine cancer 07/14/2014     Anette GuarneriYevgeniya Keauna Brasel, PT, DPT 10/06/18 1:59 PM   [facility] [place] [address]  [address] [place], [place], [address] Phone: [phone]   Fax:  [phone]  Name: Halford DecampJudith Ann Nicotra MRN: [id number] Date of Birth: 1945-11-11

## 2018-10-06 NOTE — Patient Instructions (Signed)

## 2018-10-09 ENCOUNTER — Other Ambulatory Visit: Payer: Self-pay

## 2018-10-09 ENCOUNTER — Ambulatory Visit: Payer: Medicare Other | Admitting: Physical Therapy

## 2018-10-09 VITALS — HR 59

## 2018-10-09 DIAGNOSIS — R29898 Other symptoms and signs involving the musculoskeletal system: Secondary | ICD-10-CM

## 2018-10-09 DIAGNOSIS — M25551 Pain in right hip: Secondary | ICD-10-CM

## 2018-10-09 DIAGNOSIS — M545 Low back pain: Secondary | ICD-10-CM | POA: Diagnosis not present

## 2018-10-09 DIAGNOSIS — M25552 Pain in left hip: Secondary | ICD-10-CM

## 2018-10-09 DIAGNOSIS — G8929 Other chronic pain: Secondary | ICD-10-CM

## 2018-10-09 NOTE — Therapy (Signed)
[facility] [address]  [place], [place], [address] Phone: [phone]   Fax:  [phone]  Physical Therapy Treatment  Patient Details  Name: Kristina Lee MRN: [id number] Date of Birth: [birth year] Referring Provider (PT): Karlton Lemon, MD   Encounter Date: 10/09/2018  PT End of Session - 10/09/18 1449    Visit Number  27    Number of Visits  33    Date for PT Re-Evaluation  11/27/18    Authorization Type  [employer]HC Medicare    PT Start Time  1403    PT Stop Time  1443    PT Time Calculation (min)  40 min    Activity Tolerance  Patient tolerated treatment well;Patient limited by pain    Behavior During Therapy  [facility] for tasks assessed/performed       Past Medical History:  Diagnosis Date  . Diabetes mellitus without complication ([place])   . Heart attack ([place]) 09/07/2017  . Urticaria     Past Surgical History:  Procedure Laterality Date  . ABDOMINAL HYSTERECTOMY  2006   b/l SPO and TAH, uterine cancer  . EYE SURGERY Bilateral 1960  . TUBAL LIGATION      Vitals:   10/09/18 1406  Pulse: (!) 59  SpO2: 98%    Subjective Assessment - 10/09/18 1410    Subjective  Reports that she went to the [facility] and they are not doing water aerobics at this time. "With as much pain as I am still in, I think I want to continue with therapy." Reporting 85% improvement since starting PT. Feels like her pain levels have gone down.    Pertinent History  MI 2019, DM, vascular dementia with behavior disturbance, CAD, GERD, HLD, hypokalemia, lumbosacral DDD, CKD III, HTN, hx of uterine CA    Diagnostic tests  03/12/18 lumbar xray: There is no acute bony abnormality of the lumbar spine. Mild chronic changes as described.    Patient Stated Goals  decrease pain    Currently in Pain?  Yes    Pain Score  4     Pain Location  Hip    Pain Orientation  Right;Left    Pain Descriptors / Indicators  Aching    Pain Type  Chronic pain          [facility] PT Assessment - 10/09/18 0001      Assessment   Medical Diagnosis  R acute LBP, R ITB syndrome    Referring Provider (PT)  Karlton Lemon, MD    Onset Date/Surgical Date  04/29/16      AROM   Right Hip Flexion  100    Right Hip External Rotation   32    Right Hip Internal Rotation   11    Right Hip ABduction  22    Left Hip Flexion  105    Left Hip External Rotation   33    Left Hip Internal Rotation   9   pain in L buttock   Left Hip ABduction  19    Lumbar Flexion  toes   stretch in HS   Lumbar Extension  mildly limited   pain in LB   Lumbar - Right Side Bend  jt line    Lumbar - Left Side Bend  jt line    Lumbar - Right Rotation  mildly limited   mild-moderate pain   Lumbar - Left Rotation  [facility]      Strength  Right Hip Flexion  4+/5    Right Hip ABduction  4+/5    Right Hip ADduction  4/5    Left Hip Flexion  4+/5    Left Hip ABduction  4+/5    Left Hip ADduction  4/5    Right Knee Flexion  4/5    Right Knee Extension  4+/5    Left Knee Flexion  4/5    Left Knee Extension  4+/5    Right Ankle Dorsiflexion  5/5    Right Ankle Plantar Flexion  4+/5    Left Ankle Dorsiflexion  5/5    Left Ankle Plantar Flexion  5/5                   [facility] Adult PT Treatment/Exercise - 10/09/18 0001      Lumbar Exercises: Aerobic   Nustep  L3 x 83mn (LEs only)      Lumbar Exercises: Supine   Bridge  10 reps;3 seconds    Bridge Limitations  HS bridge    Bridge with Ball Squeeze  10 reps;3 seconds      Knee/Hip Exercises: Sidelying   Hip ADduction  Right;Left;10 reps;Strengthening    Hip ADduction Limitations  opposite LE propped on bolster             PT Education - 10/09/18 1444    Education Details  update to HEP; discussion on objective progress with PT thus far; discussion on fitness classes in order to improver overall fitness levels    Person(s) Educated  Patient    Methods  Explanation;Demonstration;Tactile cues;Verbal cues;Handout     Comprehension  Verbalized understanding;Returned demonstration       PT Short Term Goals - 10/09/18 1449      PT SHORT TERM GOAL #1   Title  Patient to be independent with initial HEP.    Time  3    Period  Weeks    Status  Achieved    Target Date  09/24/18        PT Long Term Goals - 10/09/18 1450      PT LONG TERM GOAL #1   Title  Patient to be independent with advanced HEP.    Time  6    Period  Weeks    Status  Partially Met   met for current   Target Date  11/27/18      PT LONG TERM GOAL #2   Title  [facility] to [facility] AR[facility].    Time  6    Period  Weeks    Status  Partially Met   improvements in extension, R & L sidebending, and R rotation AROM   Target Date  11/27/18      PT LONG TERM GOAL #3   Title  Patient to demonstrate B LE strength >=4+/5.     Time  6    Period  Weeks    Status  On-going   still limited in B hip adduction and B knee flexion strength   Target Date  11/27/18      PT LONG TERM GOAL #4   Title  Patient to report 50% improvement in pain levels with supine<>sit and STS transfers.     Time  5    Period  Weeks    Status  Achieved   09/29/18: notes 85% improvement     PT LONG TERM GOAL #5   Title  Patient to demonstrate [facility] AR[facility].  Time  6    Period  Weeks    Status  Partially Met   improvements in R hip ER, abduction and L flexion, ER, IR AROM   Target Date  11/27/18            Plan - 10/09/18 1455    Clinical Impression Statement  Patient arrived to session with report of 85% improvement since initial eval. Notes that she overall feels better since starting PT. Feels that still intermittently feels a considerable amount of pain with prolonged activity, such as when she recently went to the [employer]. Patient today demonstrating good improvements in lumbar extension, R & L sidebending, and R rotation AROM. Also showing improvements in R hip ER, abduction and L flexion, ER, IR  AROM. Strength testing still limited in B hip adduction and B knee flexion, but patient with much improved ability to perform sidelying hip abduction without pain today. Reviewed exercises that target remaining impaired muscle groups- patient reported understanding. Discussed benefits of [employer] program for continued fitness regimen- patient still considering this. Ended session with mild B hip pain, no worse than baseline. Patient showing good improvements, would benefit from continued skilled PT services 1x/week for 6 weeks to address remaining goals and increase patient's fitness and activity levels.    Clinical Impairments Affecting Rehab Potential  MI 2019, DM, vascular dementia with behavior disturbance, CAD, GERD, HLD, hypokalemia, lumbosacral DDD, CKD III, HTN, hx of uterine CA    PT Frequency  1x / week    PT Duration  6 weeks    PT Treatment/Interventions  ADLs/Self Care Home Management;Cryotherapy;Electrical Stimulation;Functional mobility training;Stair training;Gait training;DME Instruction;Ultrasound;Moist Heat;Therapeutic activities;Therapeutic exercise;Balance training;Neuromuscular re-education;Patient/family education;Passive range of motion;Manual techniques;Dry needling;Energy conservation;Taping;Iontophoresis 34m/ml Dexamethasone    PT Next Visit Plan  B hip adduction and hamstring strengthening ; progress hip strengthening and stretching    Consulted and Agree with Plan of Care  Patient       Patient will benefit from skilled therapeutic intervention in order to improve the following deficits and impairments:  Decreased range of motion, Difficulty walking, Increased muscle spasms, Decreased activity tolerance, Pain, Decreased balance, Impaired flexibility, Improper body mechanics, Postural dysfunction, Decreased strength  Visit Diagnosis: 1. Chronic bilateral low back pain without sciatica   2. Pain in left hip   3. Pain in right hip   4. Other symptoms and signs involving the  musculoskeletal system        Problem List Patient Active Problem List   Diagnosis Date Noted  . Hair loss 08/10/2018  . Hearing loss 08/10/2018  . Otitis externa 08/10/2018  . Varicose veins of right lower extremity with complications [id number] . URI (upper respiratory infection) 05/08/2018  . Vascular dementia with behavior disturbance ([place] 02/24/2018  . Myalgia 02/09/2018  . Seasonal allergic rhinitis due to pollen 01/21/2018  . Cough 12/23/2017  . Allergic urticaria 12/23/2017  . History of myocardial infarction in last year 12/23/2017  . Diabetes mellitus ([place] 12/23/2017  . Rash and nonspecific skin eruption 12/23/2017  . Intrinsic atopic dermatitis 12/23/2017  . CAD (coronary artery disease) 09/25/2017  . Low back pain 09/25/2017  . Gastroesophageal reflux disease with esophagitis 09/04/2017  . Hyperlipidemia associated with type 2 diabetes mellitus ([place] 12/31/2016  . Hypokalemia 10/28/2015  . DDD (degenerative disc disease), lumbosacral 09/13/2015  . Diverticulosis of large intestine 09/13/2015  . Seborrheic dermatitis of scalp 09/13/2015  . Chronic kidney disease, stage III (moderate) ([place] 05/02/2015  . Allergic rhinitis 07/14/2014  .  Essential hypertension 07/14/2014  . History of uterine cancer 07/14/2014     Janene Harvey, PT, DPT 10/09/18 3:08 PM   [facility] [address]  [place], [place], [address] Phone: [phone]   Fax:  [phone]  Name: Maryon Kemnitz MRN: [id number] Date of Birth: [birth year]

## 2018-10-16 ENCOUNTER — Ambulatory Visit: Payer: Medicare Other | Attending: Family Medicine | Admitting: Physical Therapy

## 2018-10-16 DIAGNOSIS — R29898 Other symptoms and signs involving the musculoskeletal system: Secondary | ICD-10-CM | POA: Insufficient documentation

## 2018-10-16 DIAGNOSIS — M25551 Pain in right hip: Secondary | ICD-10-CM | POA: Insufficient documentation

## 2018-10-16 DIAGNOSIS — M545 Low back pain: Secondary | ICD-10-CM | POA: Insufficient documentation

## 2018-10-16 DIAGNOSIS — G8929 Other chronic pain: Secondary | ICD-10-CM | POA: Insufficient documentation

## 2018-10-16 DIAGNOSIS — M25552 Pain in left hip: Secondary | ICD-10-CM | POA: Insufficient documentation

## 2018-10-23 ENCOUNTER — Encounter: Payer: Medicare Other | Admitting: Physical Therapy

## 2018-10-30 ENCOUNTER — Ambulatory Visit: Payer: Medicare Other | Admitting: Physical Therapy

## 2018-10-30 ENCOUNTER — Encounter: Payer: Self-pay | Admitting: Physical Therapy

## 2018-10-30 ENCOUNTER — Other Ambulatory Visit: Payer: Self-pay

## 2018-10-30 VITALS — BP 150/75 | HR 59

## 2018-10-30 DIAGNOSIS — R29898 Other symptoms and signs involving the musculoskeletal system: Secondary | ICD-10-CM

## 2018-10-30 DIAGNOSIS — M25551 Pain in right hip: Secondary | ICD-10-CM

## 2018-10-30 DIAGNOSIS — M25552 Pain in left hip: Secondary | ICD-10-CM

## 2018-10-30 DIAGNOSIS — G8929 Other chronic pain: Secondary | ICD-10-CM | POA: Diagnosis present

## 2018-10-30 DIAGNOSIS — M545 Low back pain: Secondary | ICD-10-CM | POA: Diagnosis present

## 2018-10-30 NOTE — Patient Instructions (Addendum)

## 2018-10-30 NOTE — Therapy (Signed)
[facility] [address]  [place], [place], [address] Phone: [phone]   Fax:  [phone]  Physical Therapy Treatment  Patient Details  Name: Kristina Lee MRN: [id number] Date of Birth: December 11, 1945 Referring Provider (PT): Karlton Lemon, MD   Encounter Date: 10/30/2018  PT End of Session - 10/30/18 1216    Visit Number  28    Number of Visits  33    Date for PT Re-Evaluation  11/27/18    Authorization Type  UHC Medicare    PT Start Time  0931    PT Stop Time  1015    PT Time Calculation (min)  44 min    Activity Tolerance  Patient tolerated treatment well;Patient limited by pain    Behavior During Therapy  [facility] for tasks assessed/performed       Past Medical History:  Diagnosis Date  . Diabetes mellitus without complication ([place])   . Heart attack ([place]) 09/07/2017  . Urticaria     Past Surgical History:  Procedure Laterality Date  . ABDOMINAL HYSTERECTOMY  2006   b/l SPO and TAH, uterine cancer  . EYE SURGERY Bilateral 1960  . TUBAL LIGATION      Vitals:   10/30/18 0932  BP: (!) 150/75  Pulse: (!) 59  SpO2: 97%    Subjective Assessment - 10/30/18 0935    Subjective  Reports that she hurt every single day on vacation. Was having trouble walking d/t L hip pain, L shoulder pain, and R hip pain radiating down lateral leg. HEP was causing her pain so she did not do them the entire time while on vacation.    Pertinent History  MI 2019, DM, vascular dementia with behavior disturbance, CAD, GERD, HLD, hypokalemia, lumbosacral DDD, CKD III, HTN, hx of uterine CA    Diagnostic tests  03/12/18 lumbar xray: There is no acute bony abnormality of the lumbar spine. Mild chronic changes as described.    Patient Stated Goals  decrease pain    Currently in Pain?  Yes    Pain Score  3     Pain Location  Hip    Pain Orientation  Right;Left    Pain Descriptors / Indicators  Aching    Pain Type  Chronic pain                        [facility] Adult PT Treatment/Exercise - 10/30/18 0001      Lumbar Exercises: Aerobic   Nustep  L4 x 2mn (LEs only)      Lumbar Exercises: Supine   Other Supine Lumbar Exercises  LTR x20 to tolerance      Knee/Hip Exercises: Stretches   Passive Hamstring Stretch  Right;Left;1 rep;30 seconds    Passive Hamstring Stretch Limitations  supine strap    ITB Stretch  Right;Left;1 rep;30 seconds    ITB Stretch Limitations  supine strap   reporting mild pain in lateral hips   Piriformis Stretch  Right;Left;30 seconds;1 rep    Piriformis Stretch Limitations  KTOS supine    Other Knee/Hip Stretches  R & L figure 4 stretch supine x30"      Knee/Hip Exercises: Standing   Hip Abduction  Stengthening;Right;Left;1 set;10 reps;Knee straight    Abduction Limitations  at counter top   reporting B lateral hip pain   Hip Extension  Stengthening;Right;Left;1 set;15 reps;Knee straight    Extension Limitations  at sink    Functional Squat  1 set;15 reps    Functional Squat Limitations  at counter top; cues to bring bottom back    Other Standing Knee Exercises  sidestepping with yellow TB around ankles 4x length of counter      Manual Therapy   Manual Therapy  Soft tissue mobilization;Myofascial release    Manual therapy comments  prone    Soft tissue mobilization  STM to L glute and piriformis- most tenderness and soft tissue restriction in inferomedial glute     Myofascial Release  TPR to L inferomedial glute             PT Education - 10/30/18 1214    Education Details  update HEP; edu on important of maintaining compliance with HEP and not avoiding movement d/t pain; edu on ionto use, wear time, precautions    Person(s) Educated  Patient    Methods  Explanation;Demonstration;Tactile cues;Verbal cues;Handout    Comprehension  Verbalized understanding;Returned demonstration       PT Short Term Goals - 10/09/18 1449      PT SHORT TERM GOAL #1   Title   Patient to be independent with initial HEP.    Time  3    Period  Weeks    Status  Achieved    Target Date  09/24/18        PT Long Term Goals - 10/09/18 1450      PT LONG TERM GOAL #1   Title  Patient to be independent with advanced HEP.    Time  6    Period  Weeks    Status  Partially Met   met for current   Target Date  11/27/18      PT LONG TERM GOAL #2   Title  Patient to demonstrate [facility] and pain-free lumbar AROM.    Time  6    Period  Weeks    Status  Partially Met   improvements in extension, R & L sidebending, and R rotation AROM   Target Date  11/27/18      PT LONG TERM GOAL #3   Title  Patient to demonstrate B LE strength >=4+/5.     Time  6    Period  Weeks    Status  On-going   still limited in B hip adduction and B knee flexion strength   Target Date  11/27/18      PT LONG TERM GOAL #4   Title  Patient to report 50% improvement in pain levels with supine<>sit and STS transfers.     Time  5    Period  Weeks    Status  Achieved   09/29/18: notes 85% improvement     PT LONG TERM GOAL #5   Title  Patient to demonstrate [facility] and pain-free B hip AROM.     Time  6    Period  Weeks    Status  Partially Met   improvements in R hip ER, abduction and L flexion, ER, IR AROM   Target Date  11/27/18            Plan - 10/30/18 1218    Clinical Impression Statement  Patient reporting pain in B hips and L shoulder while on vacation, however, today with only mild pain in hips. Patient admitting to poor compliance with HEP while on vacation d/t pain- thus educated patient on importance of HEP compliance and not avoiding movement d/t pain. Patient reported understanding. Worked on progressive LE strengthening in standing. Patient  with L lateral hip pain with standing abduction that subsided with decreased ROM. Reviewed gentle LE stretching with patient reporting sensation of intense stretch with lateral hip and buttock stretches, with observed decrease in ROM on L LE.  Patient tolerated STM to L glute and piriformis- most tenderness and soft tissue restriction in inferomedial glute today. Mild c/o neck pain after sitting up from prone at end of session. May plan to address L greater trochanteric pain with ionto next session if patient agreeable.    Clinical Impairments Affecting Rehab Potential  MI 2019, DM, vascular dementia with behavior disturbance, CAD, GERD, HLD, hypokalemia, lumbosacral DDD, CKD III, HTN, hx of uterine CA    PT Frequency  1x / week    PT Duration  6 weeks    PT Treatment/Interventions  ADLs/Self Care Home Management;Cryotherapy;Electrical Stimulation;Functional mobility training;Stair training;Gait training;DME Instruction;Ultrasound;Moist Heat;Therapeutic activities;Therapeutic exercise;Balance training;Neuromuscular re-education;Patient/family education;Passive range of motion;Manual techniques;Dry needling;Energy conservation;Taping;Iontophoresis 41m/ml Dexamethasone    PT Next Visit Plan  ionto to L greater trochanter if patient agreeable; B hip adduction and hamstring strengthening ; progress hip strengthening and stretching    Consulted and Agree with Plan of Care  Patient       Patient will benefit from skilled therapeutic intervention in order to improve the following deficits and impairments:  Decreased range of motion, Difficulty walking, Increased muscle spasms, Decreased activity tolerance, Pain, Decreased balance, Impaired flexibility, Improper body mechanics, Postural dysfunction, Decreased strength  Visit Diagnosis: 1. Chronic bilateral low back pain without sciatica   2. Pain in left hip   3. Pain in right hip   4. Other symptoms and signs involving the musculoskeletal system        Problem List Patient Active Problem List   Diagnosis Date Noted  . Hair loss 08/10/2018  . Hearing loss 08/10/2018  . Otitis externa 08/10/2018  . Varicose veins of right lower extremity with complications 003/50/0938 . URI (upper  respiratory infection) 05/08/2018  . Vascular dementia with behavior disturbance ([place] 02/24/2018  . Myalgia 02/09/2018  . Seasonal allergic rhinitis due to pollen 01/21/2018  . Cough 12/23/2017  . Allergic urticaria 12/23/2017  . History of myocardial infarction in last year 12/23/2017  . Diabetes mellitus ([place] 12/23/2017  . Rash and nonspecific skin eruption 12/23/2017  . Intrinsic atopic dermatitis 12/23/2017  . CAD (coronary artery disease) 09/25/2017  . Low back pain 09/25/2017  . Gastroesophageal reflux disease with esophagitis 09/04/2017  . Hyperlipidemia associated with type 2 diabetes mellitus ([place] 12/31/2016  . Hypokalemia 10/28/2015  . DDD (degenerative disc disease), lumbosacral 09/13/2015  . Diverticulosis of large intestine 09/13/2015  . Seborrheic dermatitis of scalp 09/13/2015  . Chronic kidney disease, stage III (moderate) ([place] 05/02/2015  . Allergic rhinitis 07/14/2014  . Essential hypertension 07/14/2014  . History of uterine cancer 07/14/2014     YJanene Harvey PT, DPT 10/30/18 12:25 PM   [facility] [place] [address]Phone: [phone]  Fax:  [phone] Name: JGayanne PrescottMRN: [id number] of Birth: [birth year]

## 2018-11-06 ENCOUNTER — Ambulatory Visit: Payer: Medicare Other | Admitting: Physical Therapy

## 2018-11-06 ENCOUNTER — Encounter: Payer: Self-pay | Admitting: Physical Therapy

## 2018-11-06 ENCOUNTER — Other Ambulatory Visit: Payer: Self-pay

## 2018-11-06 DIAGNOSIS — M25551 Pain in right hip: Secondary | ICD-10-CM

## 2018-11-06 DIAGNOSIS — M25552 Pain in left hip: Secondary | ICD-10-CM

## 2018-11-06 DIAGNOSIS — M545 Low back pain: Secondary | ICD-10-CM | POA: Diagnosis not present

## 2018-11-06 DIAGNOSIS — G8929 Other chronic pain: Secondary | ICD-10-CM

## 2018-11-06 DIAGNOSIS — R29898 Other symptoms and signs involving the musculoskeletal system: Secondary | ICD-10-CM

## 2018-11-06 NOTE — Therapy (Addendum)
[facility] [address]  [place], [place], [address] Phone: [phone]   Fax:  [phone]  Physical Therapy Progress Note  Patient Details  Name: Kristina Lee MRN: [id number] Date of Birth: 03/13/1946 Referring Provider (PT): Karlton Lemon, MD   Progress Note Reporting Period 09/16/18 to 11/06/18  See note below for Objective Data and Assessment of Progress/Goals.     Encounter Date: 11/06/2018  PT End of Session - 11/06/18 1208    Visit Number  29    Number of Visits  33    Date for PT Re-Evaluation  11/27/18    Authorization Type  [employer] Medicare    PT Start Time  0928    PT Stop Time  1011    PT Time Calculation (min)  43 min    Activity Tolerance  Patient tolerated treatment well    Behavior During Therapy  WFL for tasks assessed/performed       Past Medical History:  Diagnosis Date  . Diabetes mellitus without complication ([place])   . Heart attack ([place]) 09/07/2017  . Urticaria     Past Surgical History:  Procedure Laterality Date  . ABDOMINAL HYSTERECTOMY  2006   b/l SPO and TAH, uterine cancer  . EYE SURGERY Bilateral 1960  . TUBAL LIGATION      There were no vitals filed for this visit.  Subjective Assessment - 11/06/18 0926    Subjective  Reports that she is tired today. Has been having L hip pain with STS transfers. Agreeable to trying ionto to L hip. Reports 20-25% since intital eval.    Pertinent History  MI 2019, DM, vascular dementia with behavior disturbance, CAD, GERD, HLD, hypokalemia, lumbosacral DDD, CKD III, HTN, hx of uterine CA    Diagnostic tests  03/12/18 lumbar xray: There is no acute bony abnormality of the lumbar spine. Mild chronic changes as described.    Patient Stated Goals  decrease pain    Currently in Pain?  Yes    Pain Score  3     Pain Location  Hip    Pain Orientation  Left    Pain Descriptors / Indicators  Aching    Pain Type  Chronic pain          [facility] PT Assessment - 11/06/18 0001      AROM   Right Hip Flexion  101    Right Hip External Rotation   31    Right Hip Internal Rotation   30    Right Hip ABduction  36   moderate lateral hip pain   Left Hip Flexion  105    Left Hip External Rotation   34    Left Hip Internal Rotation   20    Left Hip ABduction  30    Lumbar Flexion  toes    Lumbar Extension  mildly limited   mild pain   Lumbar - Right Side Bend  jt line   mild pain/stretch in side   Lumbar - Left Side Bend  jt line   mild pain/stretch in side   Lumbar - Right Rotation  mildly limited   mild pain   Lumbar - Left Rotation  [facility]      Strength   Right Hip Flexion  4+/5    Right Hip ABduction  4+/5    Right Hip ADduction  4+/5    Left Hip Flexion  4+/5    Left Hip ABduction  4+/5  Left Hip ADduction  4+/5    Right Knee Flexion  4+/5    Right Knee Extension  4+/5    Left Knee Flexion  4/5    Left Knee Extension  4+/5    Right Ankle Dorsiflexion  5/5    Right Ankle Plantar Flexion  4+/5    Left Ankle Dorsiflexion  5/5    Left Ankle Plantar Flexion  5/5                   [facility] Adult PT Treatment/Exercise - 11/06/18 0001      Lumbar Exercises: Aerobic   Nustep  L4 x 12mn (LEs only)      Iontophoresis   Type of Iontophoresis  Dexamethasone    Location  L greater trochanter    Dose  1.04m 8060min    Time  4-6 hour wear time              PT Education - 11/06/18 1207    Education Details  edu on ionto use, wear time, precautions, discussion on patient's objective progress with PT and possible need for F/U with referring MD d/t continued hip pain    Person(s) Educated  Patient    Methods  Explanation;Demonstration;Tactile cues;Verbal cues    Comprehension  Verbalized understanding       PT Short Term Goals - 11/06/18 0933      PT SHORT TERM GOAL #1   Title  Patient to be independent with initial HEP.    Time  3    Period  Weeks    Status  Achieved    Target Date   09/24/18        PT Long Term Goals - 11/06/18 0933      PT LONG TERM GOAL #1   Title  Patient to be independent with advanced HEP.    Time  6    Period  Weeks    Status  Achieved      PT LONG TERM GOAL #2   Title  Patient to demonstrate WFL[facility] pain-free lumbar AROM.    Time  6    Period  Weeks    Status  Partially Met   lumbar ROM stayed consistent but with decreased pain today     PT LONG TERM GOAL #3   Title  Patient to demonstrate B LE strength >=4+/5.     Time  6    Period  Weeks    Status  Partially Met   showing improvements in R hip abduction, L hip adduction, and R knee flexion strength; L knee flexion only limited muscle group     PT LONG TERM GOAL #4   Title  Patient to report 50% improvement in pain levels with supine<>sit and STS transfers.     Time  5    Period  Weeks    Status  Achieved   09/29/18: notes 85% improvement     PT LONG TERM GOAL #5   Title  Patient to demonstrate WFL[facility] Endoscopy Centerd pain-free B hip AROM.     Time  6    Period  Weeks    Status  Partially Met   improvements in R hip flexion, L hip ER, B hip IR, and B hip abduction; ROM now [facility] only painful with R hip abduction           Plan - 11/06/18 1212    Clinical Impression Statement  Patient reporting 20-25% improvement in pain levels since picking  up with PT after last 30 day hold. Still having intermittent and fluctuating pain in multiple areas of her body, but does note increased pain in L hip with STS transfers. Agreeable to ionto patch today to this area for hopeful pain relief. Patient still planning to joint fitness program at the [facility]. Strength testing revealed improvements in R hip abduction, L hip adduction, and R knee flexion strength; L knee flexion only limited muscle group at this time. Lumbar AROM stayed consistent but with decreased pain with movements today. Hip AROM has improved in R hip flexion, L hip ER, B hip IR, and B hip abduction; ROM now [facility] and only painful with R hip  abduction. Educated patient on ionto use, wear time, precautions and explained to patient that remaining impairments are minimal. D/t patient's continued and inconsistent pain levels, advised patient to F/U with referring MD before picking back up with PT. Patient agreeable. Placing patient on 30 day hold at this time.    Clinical Impairments Affecting Rehab Potential  MI 2019, DM, vascular dementia with behavior disturbance, CAD, GERD, HLD, hypokalemia, lumbosacral DDD, CKD III, HTN, hx of uterine CA    PT Frequency  1x / week    PT Duration  6 weeks    PT Treatment/Interventions  ADLs/Self Care Home Management;Cryotherapy;Electrical Stimulation;Functional mobility training;Stair training;Gait training;DME Instruction;Ultrasound;Moist Heat;Therapeutic activities;Therapeutic exercise;Balance training;Neuromuscular re-education;Patient/family education;Passive range of motion;Manual techniques;Dry needling;Energy conservation;Taping;Iontophoresis 14m/ml Dexamethasone    PT Next Visit Plan  30 day hold at this time    Consulted and Agree with Plan of Care  Patient       Patient will benefit from skilled therapeutic intervention in order to improve the following deficits and impairments:  Decreased range of motion, Difficulty walking, Increased muscle spasms, Decreased activity tolerance, Pain, Decreased balance, Impaired flexibility, Improper body mechanics, Postural dysfunction, Decreased strength  Visit Diagnosis: 1. Chronic bilateral low back pain without sciatica   2. Pain in left hip   3. Pain in right hip   4. Other symptoms and signs involving the musculoskeletal system        Problem List Patient Active Problem List   Diagnosis Date Noted  . Hair loss 08/10/2018  . Hearing loss 08/10/2018  . Otitis externa 08/10/2018  . Varicose veins of right lower extremity with complications 029/[id number] . URI (upper respiratory infection) 05/08/2018  . Vascular dementia with behavior  disturbance ([place] 02/24/2018  . Myalgia 02/09/2018  . Seasonal allergic rhinitis due to pollen 01/21/2018  . Cough 12/23/2017  . Allergic urticaria 12/23/2017  . History of myocardial infarction in last year 12/23/2017  . Diabetes mellitus ([place] 12/23/2017  . Rash and nonspecific skin eruption 12/23/2017  . Intrinsic atopic dermatitis 12/23/2017  . CAD (coronary artery disease) 09/25/2017  . Low back pain 09/25/2017  . Gastroesophageal reflux disease with esophagitis 09/04/2017  . Hyperlipidemia associated with type 2 diabetes mellitus ([place] 12/31/2016  . Hypokalemia 10/28/2015  . DDD (degenerative disc disease), lumbosacral 09/13/2015  . Diverticulosis of large intestine 09/13/2015  . Seborrheic dermatitis of scalp 09/13/2015  . Chronic kidney disease, stage III (moderate) ([place] 05/02/2015  . Allergic rhinitis 07/14/2014  . Essential hypertension 07/14/2014  . History of uterine cancer 07/14/2014     YJanene Harvey PT, DPT 11/06/18 12:20 PM   [facility] [address] [place] [address]Phone: [phone]  Fax:  [phone] Name: Kristina ShupingMRN: [id number] of Birth: 5Mar 14, 1947  PHYSICAL THERAPY DISCHARGE SUMMARY  Visits from Start of Care: 9  Current functional level related to goals / functional outcomes: See above clinical impression; patient did not return after being placed on 30 day hold   Remaining deficits: Decreased ROM and decreased strength   Education / Equipment: HEP  Plan: Patient agrees to discharge.  Patient goals were partially met. Patient is being discharged due to not returning since the last visit.  ?????     Janene Harvey, PT, DPT 12/10/18 1:56 PM

## 2018-11-10 ENCOUNTER — Ambulatory Visit (INDEPENDENT_AMBULATORY_CARE_PROVIDER_SITE_OTHER): Payer: Medicare Other | Admitting: Family Medicine

## 2018-11-10 ENCOUNTER — Other Ambulatory Visit: Payer: Self-pay

## 2018-11-10 VITALS — BP 134/78 | Ht 62.0 in | Wt 130.0 lb

## 2018-11-10 DIAGNOSIS — M545 Low back pain, unspecified: Secondary | ICD-10-CM

## 2018-11-10 NOTE — Patient Instructions (Signed)
You're doing well. Continue with the home exercises most days of the week. [facility]. Consider Boswellia extract for arthritis. Continue the topical medication as needed up to 4 times a day. Tylenol as needed for pain. Rarely use aleve though with your history of a heart attack though this is the safest of the anti-inflammatories. Follow up with me as needed.

## 2018-11-11 ENCOUNTER — Encounter: Payer: Self-pay | Admitting: Family Medicine

## 2018-11-11 NOTE — Progress Notes (Signed)
PCP: Mosie Lukes, MD  Subjective:   HPI: Patient is a 73 y.o. female here for low back, hip pain.  1/2: Patient here with her husband. They report she's had problems with her low back dating to her birthday in May 2017. She woke up with fairly severe pain. Sought care and told she had 4 degenerative discs. Recalls seeing ortho and doing physical therapy, had good success and pain resolved. Similar issue in 2018, did well with physical therapy. Current pain may have started early in 2019. She had an episode in here of severe pain posterior bilateral back more to right side radiating into her knees. Couldn't bear weight for about 2 weeks. No bowel/bladder dysfunction. No skin changes, numbness. Pain level up to 6/10 on right, 1/10 on left.  2/14: Patient reports she's doing better. Taking tylenol with voltaren gel at times. Doing physical therapy and home exercises regularly. Pain down to 1/10 level at worst. No bowel/bladder dysfunction.  3/25: Patient reports she's doing much better. Had a few days of pain over the weekend but none currently. States in morning she will get pain across low back. No radiation down legs. Takes tylenol and voltaren gel sometimes. No skin changes.  7/27: Patient reports she's doing well overall. Her pain has been somewhat inconsistent and most recently in lateral left hip. Has pain posterior neck and low back that is mild currently. Using diclofenac gel with mild benefit. States physical therapy and home exercises have helped her. No bowel/bladder dysfunction, numbness.  Past Medical History:  Diagnosis Date  . Diabetes mellitus without complication ([place])   . Heart attack ([place]) 09/07/2017  . Urticaria     Current Outpatient Medications on File Prior to Visit  Medication Sig Dispense Refill  . aspirin EC 81 MG tablet Take 81 mg by mouth daily.    Marland Kitchen atorvastatin (LIPITOR) 80 MG tablet Take 1 tablet (80 mg total) by mouth daily. 90  tablet 1  . blood glucose meter kit and supplies KIT Dispense based on patient and insurance preference. Use up to four times daily as directed.Dx E11.9. Check BS BID PRN 1 each 0  . Calcium Carbonate-Vit D-Min (CALCIUM 600+D PLUS MINERALS) 600-400 MG-UNIT TABS Take 1 tablet by mouth daily.     . diclofenac sodium (VOLTAREN) 1 % GEL APPLY 4 G TOPICALLY 4 (FOUR) TIMES DAILY AS NEEDED FOR PAIN 500 g 1  . famotidine (PEPCID) 20 MG tablet Take 1 tablet (20 mg total) by mouth 2 (two) times daily as needed for heartburn or indigestion. 180 tablet 1  . fexofenadine (ALLEGRA) 180 MG tablet Take 180 mg by mouth 2 (two) times daily.     Marland Kitchen lisinopril (ZESTRIL) 2.5 MG tablet Take 1 tablet (2.5 mg total) by mouth daily. 90 tablet 1  . minoxidil (ROGAINE) 2 % external solution Apply topically 2 (two) times daily. 60 mL 0  . neomycin-colistin-hydrocortisone-thonzonium (CORTISPORIN-TC) 3.06-16-08-0.5 MG/ML OTIC suspension Place 3 drops into the right ear 3 (three) times daily. 10 mL 0  . ticagrelor (BRILINTA) 90 MG TABS tablet TAKE 1 TABLET (90 MG TOTAL) BY MOUTH 2 (TWO) TIMES DAILY. 180 tablet 1   No current facility-administered medications on file prior to visit.     Past Surgical History:  Procedure Laterality Date  . ABDOMINAL HYSTERECTOMY  2006   b/l SPO and TAH, uterine cancer  . EYE SURGERY Bilateral 1960  . TUBAL LIGATION      Allergies  Allergen Reactions  . Amoxicillin Anaphylaxis  .  Hydroxyzine Itching and Other (See Comments)    Other reaction(s): Other (See Comments) Sedation Sedation Sedation   . Sulfa Antibiotics Swelling    Other reaction(s): Swelling tongue tongue tongue     Social History   Socioeconomic History  . Marital status: Married    Spouse name: Not on file  . Number of children: Not on file  . Years of education: Not on file  . Highest education level: Not on file  Occupational History  . Not on file  Social Needs  . Financial resource strain: Not on file   . Food insecurity    Worry: Not on file    Inability: Not on file  . Transportation needs    Medical: Not on file    Non-medical: Not on file  Tobacco Use  . Smoking status: Former Smoker    Packs/day: 0.50    Years: 24.00    Pack years: 12.00    Types: Cigarettes    Quit date: 04/17/1987    Years since quitting: 31.5  . Smokeless tobacco: Never Used  Substance and Sexual Activity  . Alcohol use: Not Currently  . Drug use: Not Currently  . Sexual activity: Yes  Lifestyle  . Physical activity    Days per week: Not on file    Minutes per session: Not on file  . Stress: Not on file  Relationships  . Social Herbalist on phone: Not on file    Gets together: Not on file    Attends religious service: Not on file    Active member of club or organization: Not on file    Attends meetings of clubs or organizations: Not on file    Relationship status: Not on file  . Intimate partner violence    Fear of current or ex partner: Not on file    Emotionally abused: Not on file    Physically abused: Not on file    Forced sexual activity: Not on file  Other Topics Concern  . Not on file  Social History Narrative  . Not on file    Family History  Problem Relation Age of Onset  . Cancer Mother        liver cancer, breast cancer x 2   . Alcohol abuse Mother        smoker  . Heart disease Father        MI  . Alcohol abuse Father        smoker  . Depression Sister   . Tremor Sister   . Cancer Brother        pancreatic  . Diabetes Maternal Grandmother   . Hearing loss Maternal Grandmother   . Depression Maternal Grandmother   . Allergic rhinitis Neg Hx   . Angioedema Neg Hx   . Asthma Neg Hx   . Eczema Neg Hx   . Immunodeficiency Neg Hx   . Urticaria Neg Hx     BP 134/78   Ht 5' 2"  (1.575 m)   Wt 130 lb (59 kg)   BMI 23.78 kg/m   Review of Systems: See HPI above.     Objective:  Physical Exam:  Gen: NAD, comfortable in exam room  Back: No gross  deformity, scoliosis. No TTP .  No midline or bony TTP. FROM without pain currently. Strength LEs 5/5 all muscle groups.   Negative SLRs. Sensation intact to light touch bilaterally.  Bilateral hips: No deformity. FROM with 5/5 strength. No tenderness  to palpation. NVI distally. Negative logroll bilateral hips   Assessment & Plan:  1. Low back pain into both legs - improving and exam reassuring today.  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  Encouraged water aerobics.  Continue diclofenac gel.  Tylenol as needed.  F/u prn.

## 2018-11-13 ENCOUNTER — Ambulatory Visit: Payer: Medicare Other | Admitting: Physical Therapy

## 2018-12-11 ENCOUNTER — Other Ambulatory Visit: Payer: Self-pay | Admitting: Family Medicine

## 2018-12-11 DIAGNOSIS — Z1231 Encounter for screening mammogram for malignant neoplasm of breast: Secondary | ICD-10-CM

## 2018-12-26 ENCOUNTER — Other Ambulatory Visit: Payer: Self-pay | Admitting: Family Medicine

## 2019-01-02 IMAGING — DX DG CHEST 2V
2 series · 2 of 2 positions shown · non-contrast
Comparison: None.

CLINICAL DATA: Acute onset of shaking.

EXAM:
CHEST - 2 VIEW

[chest pa]
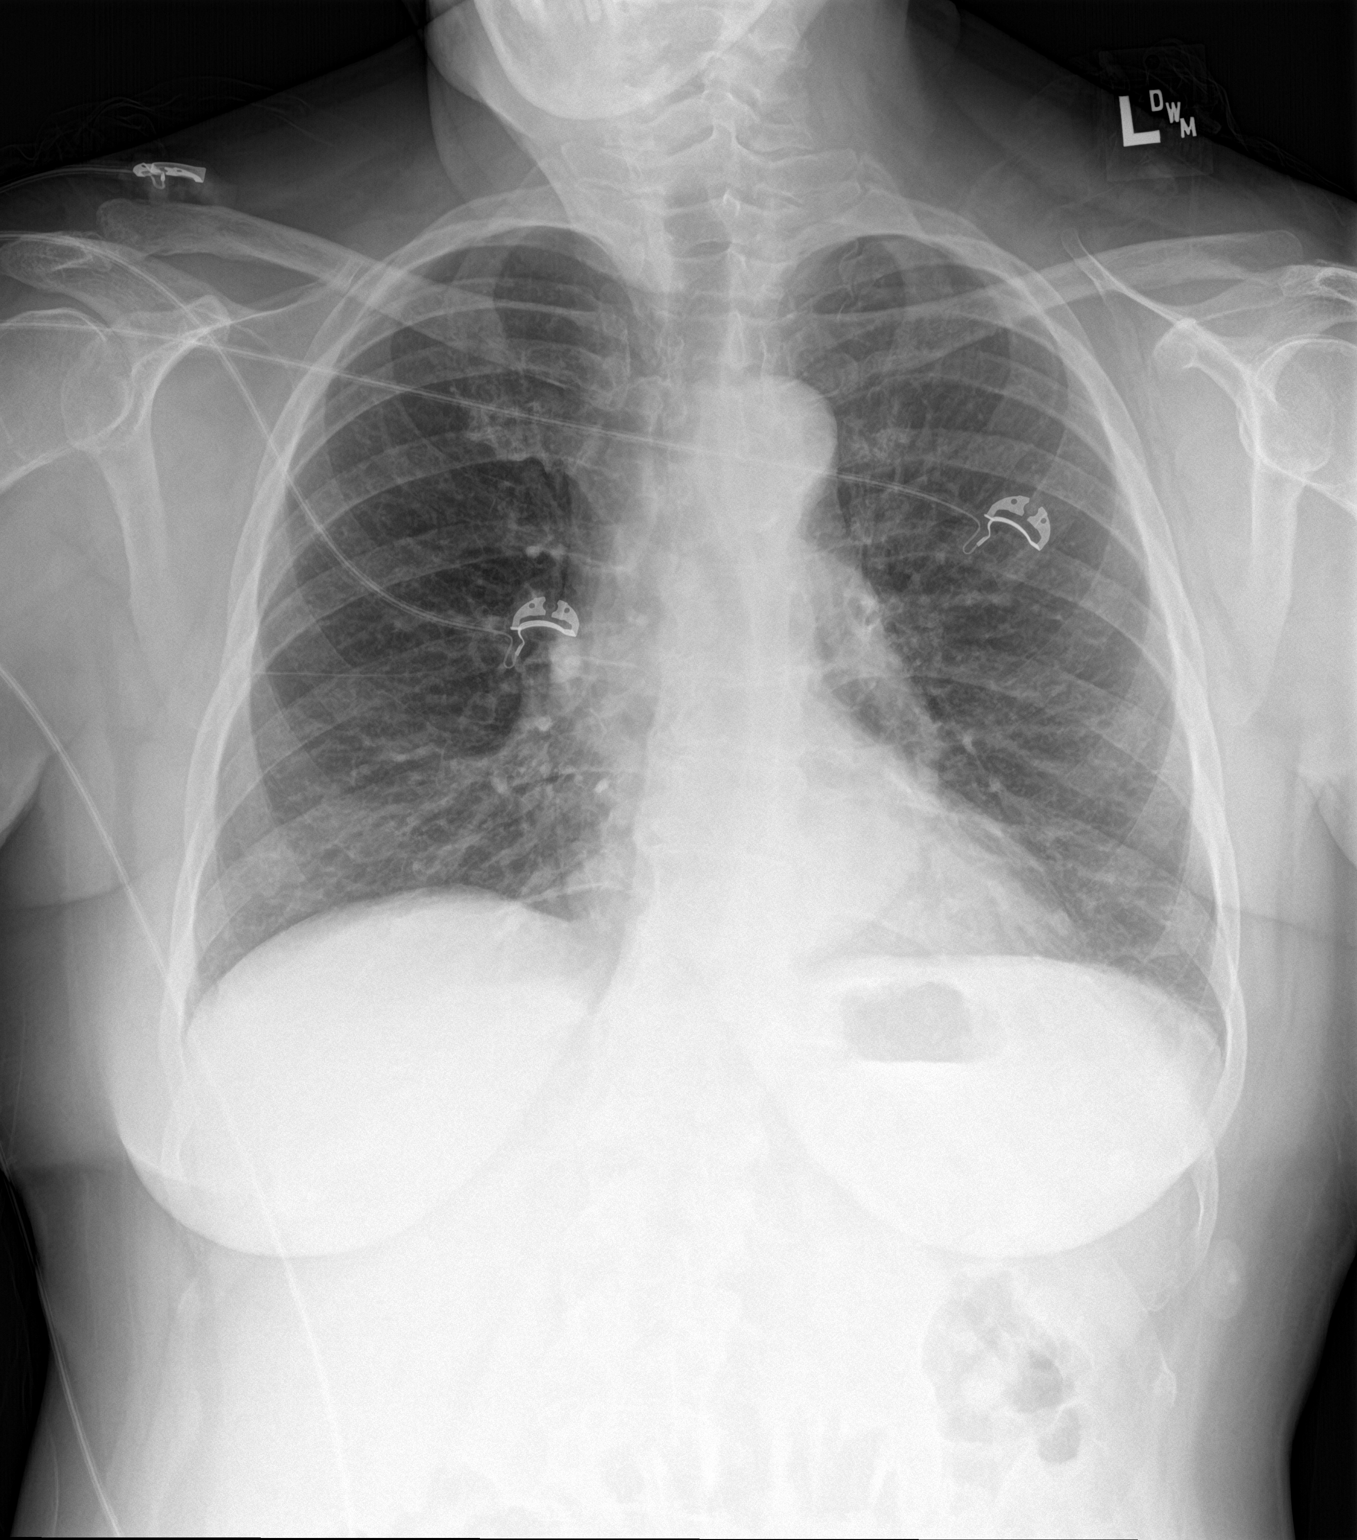

[chest lat]
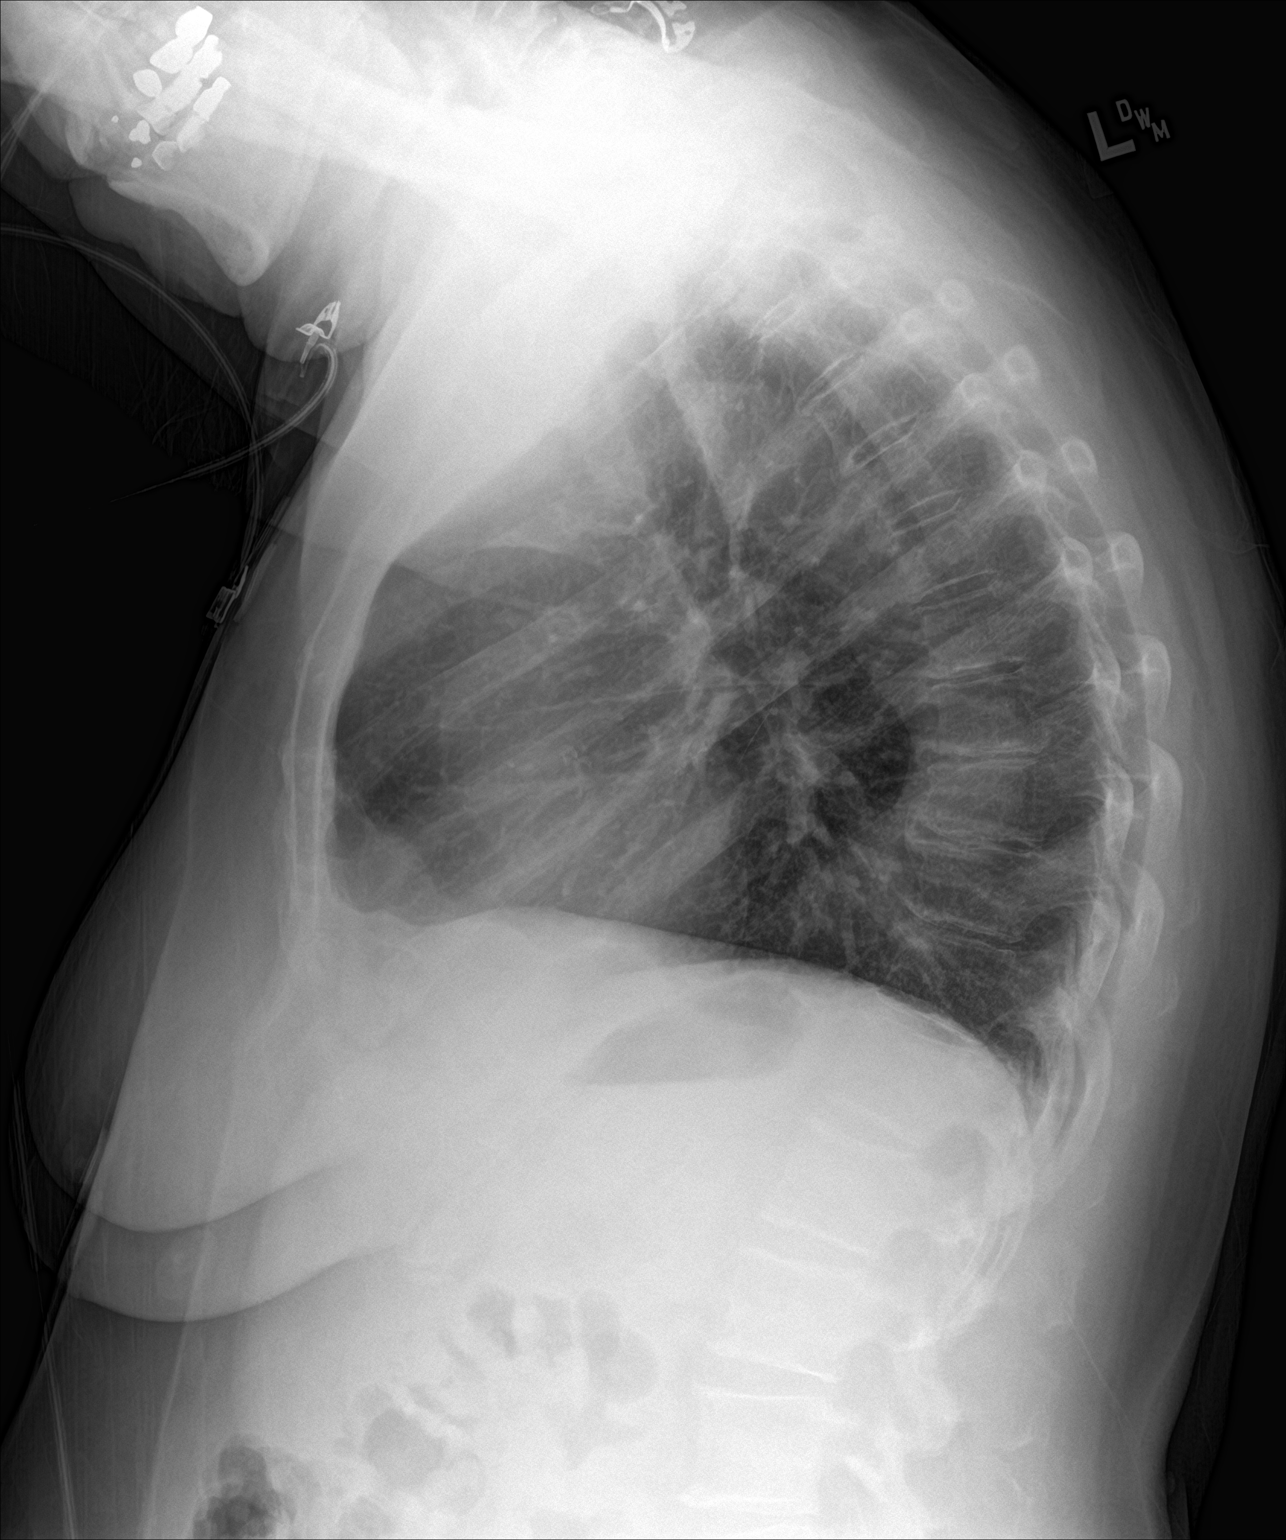

[2 of 2 positions shown; findings below may reference images not displayed]

FINDINGS: The lungs are well-aerated. Mild left basilar atelectasis is noted.
There is no evidence of pleural effusion or pneumothorax.

The heart is normal in size; the mediastinal contour is within
normal limits. No acute osseous abnormalities are seen.
IMPRESSION: Mild left basilar atelectasis noted; lungs otherwise clear.

## 2019-01-03 LAB — HM DIABETES EYE EXAM

## 2019-01-12 ENCOUNTER — Other Ambulatory Visit: Payer: Self-pay | Admitting: Family Medicine

## 2019-01-23 ENCOUNTER — Other Ambulatory Visit: Payer: Self-pay

## 2019-01-23 ENCOUNTER — Ambulatory Visit
Admission: RE | Admit: 2019-01-23 | Discharge: 2019-01-23 | Disposition: A | Discharge status: Home / Self Care | Payer: Medicare Other | Source: Ambulatory Visit | Attending: Family Medicine | Admitting: Family Medicine

## 2019-01-23 DIAGNOSIS — Z1231 Encounter for screening mammogram for malignant neoplasm of breast: Secondary | ICD-10-CM

## 2019-01-29 ENCOUNTER — Other Ambulatory Visit: Payer: Self-pay | Admitting: Family Medicine

## 2019-02-16 ENCOUNTER — Encounter: Payer: Self-pay | Admitting: Family Medicine

## 2019-03-10 ENCOUNTER — Ambulatory Visit: Payer: Medicare Other | Admitting: Cardiovascular Disease

## 2019-03-10 ENCOUNTER — Encounter: Payer: Self-pay | Admitting: Cardiovascular Disease

## 2019-03-10 ENCOUNTER — Other Ambulatory Visit: Payer: Self-pay

## 2019-03-10 DIAGNOSIS — I10 Essential (primary) hypertension: Secondary | ICD-10-CM | POA: Diagnosis not present

## 2019-03-10 MED ORDER — CLOPIDOGREL BISULFATE 75 MG PO TABS: 75.0000 mg | ORAL_TABLET | Freq: Every day | ORAL | 3 refills | Status: DC

## 2019-03-10 NOTE — Assessment & Plan Note (Signed)
History of essential hypertension with blood pressure measured today 151/77 on low-dose lisinopril.  She does have a lot of stress in her life at the current time which may be contributing

## 2019-03-10 NOTE — Assessment & Plan Note (Signed)
History of hyperlipidemia on high-dose atorvastatin with lipid profile performed a year ago revealing cholesterol 100, LDL 46 and HDL of 40

## 2019-03-10 NOTE — Assessment & Plan Note (Signed)
History of CAD status post non-STEMI 09/02/2017 in [place] with stenting of the proximal LAD with right radial approach.  She had no other significant CAD.  Her initial EF was 45 to 50% however more recent echo performed 11/04/2017 showed normalization of her EF.  She remains on aspirin and Brilinta.  She is completely asymptomatic.  I discontinue the Brilinta and begin Plavix 75 mg a day.

## 2019-03-10 NOTE — Addendum Note (Signed)
Addended by: Cain Sieve on: 03/10/2019 03:39 PM   Modules accepted: Orders

## 2019-03-10 NOTE — Patient Instructions (Signed)
Medication Instructions:  Stop taking Brilinta. Start taking 75mg  Plavix.  If you need a refill on your cardiac medications before your next appointment, please call your pharmacy.   Lab work: NONE  Testing/Procedures: NONE  Follow-Up: At [facility], you and your health needs are our priority.  As part of our continuing mission to provide you with exceptional heart care, we have created designated Provider Care Teams.  These Care Teams include your primary Cardiologist (physician) and Advanced Practice Providers (APPs -  Physician Assistants and Nurse Practitioners) who all work together to provide you with the care you need, when you need it. You may see Dr Gwenlyn Found or one of the following Advanced Practice Providers on your designated Care Team:    Kerin Ransom, PA-C  [place], [place]  Coletta Memos, [place]  Your physician wants you to follow-up in: 1 year. You will receive a reminder letter in the mail two months in advance. If you don't receive a letter, please call our office to schedule the follow-up appointment.

## 2019-03-10 NOTE — Progress Notes (Signed)
03/10/2019 Kristina Lee [facility]   Jun 11, 1945  [id number]  Primary Physician Mosie Lukes, MD Primary Cardiologist: Lorretta Harp MD Lupe Carney, [place]  HPI:  Kristina Lee is a 73 y.o.  thin appearing married Caucasian female mother of one biologic child, grandmother to 55 grandchildren who worked as a Production manager. She was referred by [facility] for cardiovascular evaluation evaluation and to be established in my practice because of her recent non-STEMI and intervention.  I last saw her in the office 05/28/2018.  HerRisk factors include remote tobacco abuse having quit on 01/04/1988 after smoking 25 pack years. She has treated hypertension, hyperlipidemia and diabetes which is not treated. Her father did die of a microinfarction at age 42. She had atypical symptoms leading up to a non-STEMI 09/02/2017 she was in [place] and had stenting of her proximal LAD via her right radial approach. She had no other significant CAD. EF was in the 45 to 50% range. She is had no recurrent symptoms.  She remains on aspirin and Brilinta.  She had a 2D echo performed 11/05/2017 which was entirely normal.  Since I saw her 9 months ago she continues to do well.  Fortunately her son is moved back into her house with 4 of her children creating lot of stress at home.  Also her 2 best friends that she has noted since she was in the seventh grade are both dying.  She denies chest pain or shortness of breath.  She remains on aspirin and Brilinta.   Current Meds  Medication Sig  . aspirin EC 81 MG tablet Take 81 mg by mouth daily.  Marland Kitchen atorvastatin (LIPITOR) 80 MG tablet TAKE 1 TABLET BY MOUTH EVERY DAY  . augmented betamethasone dipropionate (DIPROLENE-AF) 0.05 % cream Apply 1 application topically 2 (two) times daily.  . blood glucose meter kit and supplies KIT Dispense based on patient and insurance preference. Use up to four times daily as directed.Dx E11.9. Check BS BID PRN  .  Calcium Carbonate-Vit D-Min (CALCIUM 600+D PLUS MINERALS) 600-400 MG-UNIT TABS Take 1 tablet by mouth daily.   Marland Kitchen dexamethasone (DECADRON) 0.75 MG tablet SMARTSIG:3 Tablet(s) By Mouth Every Other Day PRN  . diclofenac sodium (VOLTAREN) 1 % GEL APPLY 4 G TOPICALLY 4 (FOUR) TIMES DAILY AS NEEDED FOR PAIN  . famotidine (PEPCID) 20 MG tablet TAKE 1 TABLET (20 MG TOTAL) BY MOUTH 2 (TWO) TIMES DAILY AS NEEDED FOR HEARTBURN OR INDIGESTION.  . fexofenadine (ALLEGRA) 180 MG tablet Take 180 mg by mouth 2 (two) times daily.   Marland Kitchen lisinopril (ZESTRIL) 2.5 MG tablet Take 1 tablet (2.5 mg total) by mouth daily.  . minoxidil (ROGAINE) 2 % external solution Apply topically 2 (two) times daily.  Marland Kitchen neomycin-colistin-hydrocortisone-thonzonium (CORTISPORIN-TC) 3.06-16-08-0.5 MG/ML OTIC suspension Place 3 drops into the right ear 3 (three) times daily.  . tacrolimus (PROTOPIC) 0.1 % ointment Apply 1 application topically 2 (two) times daily.  . [DISCONTINUED] BRILINTA 90 MG TABS tablet TAKE 1 TABLET (90 MG TOTAL) BY MOUTH 2 (TWO) TIMES DAILY.     Allergies  Allergen Reactions  . Amoxicillin Anaphylaxis  . Hydroxyzine Itching and Other (See Comments)    Other reaction(s): Other (See Comments) Sedation Sedation Sedation   . Sulfa Antibiotics Swelling    Other reaction(s): Swelling tongue tongue tongue     Social History   Socioeconomic History  . Marital status: Married    Spouse name: Not on file  . Number  of children: Not on file  . Years of education: Not on file  . Highest education level: Not on file  Occupational History  . Not on file  Social Needs  . Financial resource strain: Not on file  . Food insecurity    Worry: Not on file    Inability: Not on file  . Transportation needs    Medical: Not on file    Non-medical: Not on file  Tobacco Use  . Smoking status: Former Smoker    Packs/day: 0.50    Years: 24.00    Pack years: 12.00    Types: Cigarettes    Quit date: 04/17/1987    Years  since quitting: 31.9  . Smokeless tobacco: Never Used  Substance and Sexual Activity  . Alcohol use: Not Currently  . Drug use: Not Currently  . Sexual activity: Yes  Lifestyle  . Physical activity    Days per week: Not on file    Minutes per session: Not on file  . Stress: Not on file  Relationships  . Social Herbalist on phone: Not on file    Gets together: Not on file    Attends religious service: Not on file    Active member of club or organization: Not on file    Attends meetings of clubs or organizations: Not on file    Relationship status: Not on file  . Intimate partner violence    Fear of current or ex partner: Not on file    Emotionally abused: Not on file    Physically abused: Not on file    Forced sexual activity: Not on file  Other Topics Concern  . Not on file  Social History Narrative  . Not on file     Review of Systems: General: negative for chills, fever, night sweats or weight changes.  Cardiovascular: negative for chest pain, dyspnea on exertion, edema, orthopnea, palpitations, paroxysmal nocturnal dyspnea or shortness of breath Dermatological: negative for rash Respiratory: negative for cough or wheezing Urologic: negative for hematuria Abdominal: negative for nausea, vomiting, diarrhea, bright red blood per rectum, melena, or hematemesis Neurologic: negative for visual changes, syncope, or dizziness All other systems reviewed and are otherwise negative except as noted above.    Blood pressure (!) 151/77, pulse 61, temperature (!) 96.9 F (36.1 C), height _0  (1.575 m), weight 134 lb (60.8 kg), SpO2 97 %.  General appearance: alert and no distress Neck: no adenopathy, no carotid bruit, no JVD, supple, symmetrical, trachea midline and thyroid not enlarged, symmetric, no tenderness/mass/nodules Lungs: clear to auscultation bilaterally Heart: regular rate and rhythm, S1, S2 normal, no murmur, click, rub or gallop Extremities: extremities  normal, atraumatic, no cyanosis or edema Pulses: 2+ and symmetric Skin: Skin color, texture, turgor normal. No rashes or lesions Neurologic: Alert and oriented X 3, normal strength and tone. Normal symmetric reflexes. Normal coordination and gait  EKG sinus rhythm at 61 without ST or T wave changes.  Personally reviewed this EKG.  ASSESSMENT AND PLAN:   Essential hypertension History of essential hypertension with blood pressure measured today 151/77 on low-dose lisinopril.  She does have a lot of stress in her life at the current time which may be contributing  Hyperlipidemia associated with type 2 diabetes mellitus ([place]) History of hyperlipidemia on high-dose atorvastatin with lipid profile performed a year ago revealing cholesterol 100, LDL 46 and HDL of 40  CAD (coronary artery disease) History of CAD status post non-STEMI 09/02/2017 in  [place] with stenting of the proximal LAD with right radial approach.  She had no other significant CAD.  Her initial EF was 45 to 50% however more recent echo performed 11/04/2017 showed normalization of her EF.  She remains on aspirin and Brilinta.  She is completely asymptomatic.  I discontinue the Brilinta and begin Plavix 75 mg a day.      Lorretta Harp MD FACP,FACC,FAHA, [facility] 03/10/2019 3:29 PM

## 2019-03-28 ENCOUNTER — Other Ambulatory Visit: Payer: Self-pay | Admitting: Family Medicine

## 2019-04-05 ENCOUNTER — Other Ambulatory Visit: Payer: Self-pay | Admitting: Family Medicine

## 2019-04-14 ENCOUNTER — Telehealth: Payer: Self-pay

## 2019-04-14 NOTE — Telephone Encounter (Signed)
Copied from [place] [id number]. Topic: Appointment Scheduling - Scheduling Inquiry for Clinic >> Apr 13, 2019 12:28 PM Percell Belt A wrote: Reason for CRM: pt called in and she is needs a med fu with Dr Charlett Blake as soon as possible .  It looks like her next I can sch is FEB.  She needs to go over her meds and get refills  Best number [phone]

## 2019-05-01 ENCOUNTER — Other Ambulatory Visit: Payer: Self-pay

## 2019-05-01 ENCOUNTER — Ambulatory Visit (INDEPENDENT_AMBULATORY_CARE_PROVIDER_SITE_OTHER): Payer: Medicare PPO | Admitting: Family Medicine

## 2019-05-01 DIAGNOSIS — J301 Allergic rhinitis due to pollen: Secondary | ICD-10-CM

## 2019-05-01 DIAGNOSIS — M545 Low back pain, unspecified: Secondary | ICD-10-CM

## 2019-05-01 DIAGNOSIS — I10 Essential (primary) hypertension: Secondary | ICD-10-CM

## 2019-05-01 DIAGNOSIS — E1169 Type 2 diabetes mellitus with other specified complication: Secondary | ICD-10-CM

## 2019-05-01 DIAGNOSIS — I2510 Atherosclerotic heart disease of native coronary artery without angina pectoris: Secondary | ICD-10-CM

## 2019-05-01 DIAGNOSIS — K2100 Gastro-esophageal reflux disease with esophagitis, without bleeding: Secondary | ICD-10-CM

## 2019-05-01 DIAGNOSIS — E785 Hyperlipidemia, unspecified: Secondary | ICD-10-CM

## 2019-05-01 DIAGNOSIS — L2084 Intrinsic (allergic) eczema: Secondary | ICD-10-CM

## 2019-05-01 DIAGNOSIS — L500 Allergic urticaria: Secondary | ICD-10-CM

## 2019-05-01 DIAGNOSIS — N183 Chronic kidney disease, stage 3 unspecified: Secondary | ICD-10-CM

## 2019-05-01 MED ORDER — FLUTICASONE PROPIONATE 50 MCG/ACT NA SUSP: 2.0000 | Freq: Every day | NASAL | 1 refills | Status: DC

## 2019-05-01 MED ORDER — FEXOFENADINE HCL 180 MG PO TABS: 180.0000 mg | ORAL_TABLET | Freq: Two times a day (BID) | ORAL | 1 refills | Status: DC

## 2019-05-01 MED ORDER — FAMOTIDINE 20 MG PO TABS: 20.0000 mg | ORAL_TABLET | Freq: Two times a day (BID) | ORAL | 1 refills | Status: DC | PRN

## 2019-05-01 NOTE — Assessment & Plan Note (Signed)
Finally settled down about a month ago, she notes the Betamethasone cream from her allergist, Dr Katrinka Blazing, she has continued Famotidine and uses Allegra off and on, is taking it once a day.

## 2019-05-01 NOTE — Assessment & Plan Note (Signed)
Is doing well, no recent concerning episodes continues to follow with Dr Allyson Sabal, no changes

## 2019-05-01 NOTE — Patient Instructions (Signed)
Encouraged increased hydration and fiber in diet. Daily probiotics. If bowels not moving can use MOM 2 tbls po in 4 oz of warm prune juice by mouth every 2-3 days. If no results then repeat in 4 hours with  Dulcolax suppository pr, may repeat again in 4 more hours as needed. Seek care if symptoms worsen. Consider daily Miralax and/or Dulcolax if symptoms persist.   Try taking Miralax with Benefiber once to twice daily   Multivitamin with minerals daily with Selenium Vitamin D 1000 IU daily Probiotic daily, with Lactobacillus and Bifidophilus Aspirin 81 mg daily  Melatonin 2.5 to 5 mg daily at bedtime  Omron blood pressure cuff upper arm Pulse oximeter Check vitals weekly and as needed

## 2019-05-01 NOTE — Assessment & Plan Note (Signed)
Encouraged moist heat and gentle stretching as tolerated. May try NSAIDs and prescription meds as directed and report if symptoms worsen or seek immediate care 

## 2019-05-01 NOTE — Assessment & Plan Note (Signed)
Is following with allergist and is doing better on Betamethasone

## 2019-05-01 NOTE — Assessment & Plan Note (Signed)
hgba1c acceptable, minimize simple carbs. Increase exercise as tolerated. Continue current meds 

## 2019-05-01 NOTE — Assessment & Plan Note (Signed)
Encouraged to get a blood pressure cuff and monitor report any concerns, no changes to meds. Encouraged heart healthy diet such as the DASH diet and exercise as tolerated.

## 2019-05-03 NOTE — Assessment & Plan Note (Signed)
Encouraged heart healthy diet, increase exercise, avoid trans fats, consider a krill oil cap daily, tolerating Atorvastatin 

## 2019-05-03 NOTE — Assessment & Plan Note (Signed)
Flared some, can take Allegra bid and is given a prescription for Flonase to use as needed

## 2019-05-03 NOTE — Assessment & Plan Note (Signed)
Hydrate and monitor 

## 2019-05-03 NOTE — Progress Notes (Signed)
Virtual Visit via phone Note  I connected with Kristina Lee on 05/01/19 at  9:40 AM EST by a phone enabled telemedicine application and verified that I am speaking with the correct person using two identifiers.  Location: Patient: home Provider: home   I discussed the limitations of evaluation and management by telemedicine and the availability of in person appointments. The patient expressed understanding and agreed to proceed. Magdalene Molly, CMA was able to get the patient set up on a visit, phone after being unable to set up a video visit   Subjective:    Patient ID: Kristina Lee, female    DOB: 19-Aug-1945, 74 y.o.   MRN: [id number]  No chief complaint on file.   HPI Patient is in today for follow up on chronic medical concerns. She is staying home and maintaining quarantine well. No recent febrile illness or hospitalizations. She has been following with an allergist for past year and with betamethasone her rash has finally resolved. They believe now that she has eczema. Is only taking Fexofenadine once daily. Her congestion is flared at the moment. Denies CP/palp/SOB/HA/fevers/GI or GU c/o. Taking meds as prescribed  Past Medical History:  Diagnosis Date  . Diabetes mellitus without complication ([place])   . Heart attack ([place]) 09/07/2017  . Urticaria     Past Surgical History:  Procedure Laterality Date  . ABDOMINAL HYSTERECTOMY  2006   b/l SPO and TAH, uterine cancer  . EYE SURGERY Bilateral 1960  . TUBAL LIGATION      Family History  Problem Relation Age of Onset  . Cancer Mother        liver cancer, breast cancer x 2   . Alcohol abuse Mother        smoker  . Heart disease Father        MI  . Alcohol abuse Father        smoker  . Depression Sister   . Tremor Sister   . Cancer Brother        pancreatic  . Diabetes Maternal Grandmother   . Hearing loss Maternal Grandmother   . Depression Maternal Grandmother   . Allergic rhinitis Neg Hx   . Angioedema  Neg Hx   . Asthma Neg Hx   . Eczema Neg Hx   . Immunodeficiency Neg Hx   . Urticaria Neg Hx     Social History   Socioeconomic History  . Marital status: Married    Spouse name: Not on file  . Number of children: Not on file  . Years of education: Not on file  . Highest education level: Not on file  Occupational History  . Not on file  Tobacco Use  . Smoking status: Former Smoker    Packs/day: 0.50    Years: 24.00    Pack years: 12.00    Types: Cigarettes    Quit date: 04/17/1987    Years since quitting: 32.0  . Smokeless tobacco: Never Used  Substance and Sexual Activity  . Alcohol use: Not Currently  . Drug use: Not Currently  . Sexual activity: Yes  Other Topics Concern  . Not on file  Social History Narrative  . Not on file   Social Determinants of Health   Financial Resource Strain:   . Difficulty of Paying Living Expenses: Not on file  Food Insecurity:   . Worried About Charity fundraiser in the Last Year: Not on file  . Ran Out of Food in the Last  Year: Not on file  Transportation Needs:   . Lack of Transportation (Medical): Not on file  . Lack of Transportation (Non-Medical): Not on file  Physical Activity:   . Days of Exercise per Week: Not on file  . Minutes of Exercise per Session: Not on file  Stress:   . Feeling of Stress : Not on file  Social Connections:   . Frequency of Communication with Friends and Family: Not on file  . Frequency of Social Gatherings with Friends and Family: Not on file  . Attends Religious Services: Not on file  . Active Member of Clubs or Organizations: Not on file  . Attends Archivist Meetings: Not on file  . Marital Status: Not on file  Intimate Partner Violence:   . Fear of Current or Ex-Partner: Not on file  . Emotionally Abused: Not on file  . Physically Abused: Not on file  . Sexually Abused: Not on file    Outpatient Medications Prior to Visit  Medication Sig Dispense Refill  . aspirin EC 81 MG  tablet Take 81 mg by mouth daily.    Marland Kitchen atorvastatin (LIPITOR) 80 MG tablet TAKE 1 TABLET BY MOUTH EVERY DAY 90 tablet 1  . augmented betamethasone dipropionate (DIPROLENE-AF) 0.05 % cream Apply 1 application topically 2 (two) times daily.    . blood glucose meter kit and supplies KIT Dispense based on patient and insurance preference. Use up to four times daily as directed.Dx E11.9. Check BS BID PRN 1 each 0  . Calcium Carbonate-Vit D-Min (CALCIUM 600+D PLUS MINERALS) 600-400 MG-UNIT TABS Take 1 tablet by mouth daily.     . clopidogrel (PLAVIX) 75 MG tablet Take 1 tablet (75 mg total) by mouth daily. 90 tablet 3  . dexamethasone (DECADRON) 0.75 MG tablet SMARTSIG:3 Tablet(s) By Mouth Every Other Day PRN    . diclofenac sodium (VOLTAREN) 1 % GEL APPLY 4 G TOPICALLY 4 (FOUR) TIMES DAILY AS NEEDED FOR PAIN 500 g 1  . lisinopril (ZESTRIL) 2.5 MG tablet TAKE 1 TABLET BY MOUTH EVERY DAY 90 tablet 1  . minoxidil (ROGAINE) 2 % external solution Apply topically 2 (two) times daily. 60 mL 0  . neomycin-colistin-hydrocortisone-thonzonium (CORTISPORIN-TC) 3.06-16-08-0.5 MG/ML OTIC suspension Place 3 drops into the right ear 3 (three) times daily. 10 mL 0  . tacrolimus (PROTOPIC) 0.1 % ointment Apply 1 application topically 2 (two) times daily.    . famotidine (PEPCID) 20 MG tablet TAKE 1 TABLET (20 MG TOTAL) BY MOUTH 2 (TWO) TIMES DAILY AS NEEDED FOR HEARTBURN OR INDIGESTION. 180 tablet 1  . fexofenadine (ALLEGRA) 180 MG tablet Take 180 mg by mouth 2 (two) times daily.      No facility-administered medications prior to visit.    Allergies  Allergen Reactions  . Amoxicillin Anaphylaxis  . Hydroxyzine Itching and Other (See Comments)    Other reaction(s): Other (See Comments) Sedation Sedation Sedation   . Sulfa Antibiotics Swelling    Other reaction(s): Swelling tongue tongue tongue     Review of Systems  Constitutional: Positive for malaise/fatigue. Negative for fever.  HENT: Positive for  congestion.   Eyes: Negative for blurred vision.  Respiratory: Negative for shortness of breath.   Cardiovascular: Negative for chest pain, palpitations and leg swelling.  Gastrointestinal: Negative for abdominal pain, blood in stool and nausea.  Genitourinary: Negative for dysuria and frequency.  Musculoskeletal: Negative for falls.  Skin: Negative for rash.  Neurological: Negative for dizziness, loss of consciousness and headaches.  Endo/Heme/Allergies: Negative  for environmental allergies.  Psychiatric/Behavioral: Negative for depression. The patient is not nervous/anxious.        Objective:    Physical Exam unable to obtain via phone  There were no vitals taken for this visit. Wt Readings from Last 3 Encounters:  03/10/19 134 lb (60.8 kg)  11/10/18 130 lb (59 kg)  08/07/18 130 lb (59 kg)    Diabetic Foot Exam - Simple   No data filed     Lab Results  Component Value Date   WBC 5.6 04/04/2018   HGB 14.3 04/04/2018   HCT 42.1 04/04/2018   PLT 212.0 04/04/2018   GLUCOSE 113 (H) 04/04/2018   CHOL 100 04/04/2018   TRIG 72.0 04/04/2018   HDL 39.80 04/04/2018   LDLCALC 46 04/04/2018   ALT 16 04/04/2018   AST 26 04/04/2018   NA 143 04/04/2018   K 4.0 04/04/2018   CL 106 04/04/2018   CREATININE 0.89 04/04/2018   BUN 16 04/04/2018   CO2 30 04/04/2018   TSH 0.83 04/04/2018   HGBA1C 6.2 04/04/2018    Lab Results  Component Value Date   TSH 0.83 04/04/2018   Lab Results  Component Value Date   WBC 5.6 04/04/2018   HGB 14.3 04/04/2018   HCT 42.1 04/04/2018   MCV 86.2 04/04/2018   PLT 212.0 04/04/2018   Lab Results  Component Value Date   NA 143 04/04/2018   K 4.0 04/04/2018   CO2 30 04/04/2018   GLUCOSE 113 (H) 04/04/2018   BUN 16 04/04/2018   CREATININE 0.89 04/04/2018   BILITOT 1.2 04/04/2018   ALKPHOS 68 04/04/2018   AST 26 04/04/2018   ALT 16 04/04/2018   PROT 7.1 04/04/2018   ALBUMIN 4.1 04/04/2018   CALCIUM 10.0 04/04/2018   ANIONGAP 11  03/12/2018   GFR 66.16 04/04/2018   Lab Results  Component Value Date   CHOL 100 04/04/2018   Lab Results  Component Value Date   HDL 39.80 04/04/2018   Lab Results  Component Value Date   LDLCALC 46 04/04/2018   Lab Results  Component Value Date   TRIG 72.0 04/04/2018   Lab Results  Component Value Date   CHOLHDL 3 04/04/2018   Lab Results  Component Value Date   HGBA1C 6.2 04/04/2018       Assessment & Plan:   Problem List Items Addressed This Visit    Allergic rhinitis    Flared some, can take Allegra bid and is given a prescription for Flonase to use as needed      Chronic kidney disease, stage III (moderate)    Hydrate and monitor      Essential hypertension    Encouraged to get a blood pressure cuff and monitor report any concerns, no changes to meds. Encouraged heart healthy diet such as the DASH diet and exercise as tolerated.       Gastroesophageal reflux disease with esophagitis    Avoid offending foods, start probiotics. Do not eat large meals in late evening and consider raising head of bed. Given rx for Famotidine to use prn      Hyperlipidemia associated with type 2 diabetes mellitus ([place])    Encouraged heart healthy diet, increase exercise, avoid trans fats, consider a krill oil cap daily, tolerating Atorvastatin      CAD (coronary artery disease)    Is doing well, no recent concerning episodes continues to follow with Dr Gwenlyn Found, no changes      Low back pain  Encouraged moist heat and gentle stretching as tolerated. May try NSAIDs and prescription meds as directed and report if symptoms worsen or seek immediate care      Allergic urticaria    Finally settled down about a month ago, she notes the Betamethasone cream from her allergist, Dr Tamala Julian, she has continued Famotidine and uses Allegra off and on, is taking it once a day.      Diabetes mellitus ([facility])    hgba1c acceptable, minimize simple carbs. Increase exercise as tolerated.  Continue current meds      Intrinsic atopic dermatitis    Is following with allergist and is doing better on Betamethasone         I have changed Charlett Nose A. Short's fexofenadine. I am also having her start on fluticasone. Additionally, I am having her maintain her aspirin EC, Calcium 600+D Plus Minerals, diclofenac sodium, minoxidil, neomycin-colistin-hydrocortisone-thonzonium, blood glucose meter kit and supplies, tacrolimus, dexamethasone, augmented betamethasone dipropionate, clopidogrel, lisinopril, atorvastatin, and famotidine.  Meds ordered this encounter  Medications  . fexofenadine (ALLEGRA) 180 MG tablet    Sig: Take 1 tablet (180 mg total) by mouth 2 (two) times daily.    Dispense:  180 tablet    Refill:  1  . fluticasone (FLONASE) 50 MCG/ACT nasal spray    Sig: Place 2 sprays into both nostrils daily.    Dispense:  48 g    Refill:  1  . famotidine (PEPCID) 20 MG tablet    Sig: Take 1 tablet (20 mg total) by mouth 2 (two) times daily as needed for heartburn or indigestion.    Dispense:  180 tablet    Refill:  1      I discussed the assessment and treatment plan with the patient. The patient was provided an opportunity to ask questions and all were answered. The patient agreed with the plan and demonstrated an understanding of the instructions.   The patient was advised to call back or seek an in-person evaluation if the symptoms worsen or if the condition fails to improve as anticipated.  I provided 25 minutes of non-face-to-face time during this encounter.   Penni Homans, MD

## 2019-05-03 NOTE — Assessment & Plan Note (Signed)
Avoid offending foods, start probiotics. Do not eat large meals in late evening and consider raising head of bed. Given rx for Famotidine to use prn

## 2019-05-14 ENCOUNTER — Telehealth: Payer: Self-pay | Admitting: Family Medicine

## 2019-05-14 NOTE — Telephone Encounter (Signed)
Patient called in for advice about a discuss that her and Dr Abner Greenspan had last week. Patient states that Dr Abner Greenspan asked her to buy a diabetic monitor and patient is wondering if Dr Abner Greenspan could write a prescription so her insurance will pay for it. Patient states that she would like a call back [phone], patient states she is confused.

## 2019-05-18 ENCOUNTER — Encounter: Payer: Self-pay | Admitting: Medical

## 2019-05-18 ENCOUNTER — Other Ambulatory Visit: Payer: Self-pay

## 2019-05-18 ENCOUNTER — Ambulatory Visit: Payer: Medicare PPO | Admitting: Medical

## 2019-05-18 VITALS — BP 146/75 | HR 86 | Temp 96.1°F | Resp 16 | Ht 62.0 in | Wt 133.6 lb

## 2019-05-18 DIAGNOSIS — M25511 Pain in right shoulder: Secondary | ICD-10-CM | POA: Diagnosis not present

## 2019-05-18 DIAGNOSIS — M25559 Pain in unspecified hip: Secondary | ICD-10-CM | POA: Diagnosis not present

## 2019-05-18 DIAGNOSIS — M79644 Pain in right finger(s): Secondary | ICD-10-CM

## 2019-05-18 DIAGNOSIS — R739 Hyperglycemia, unspecified: Secondary | ICD-10-CM | POA: Diagnosis not present

## 2019-05-18 DIAGNOSIS — M2550 Pain in unspecified joint: Secondary | ICD-10-CM | POA: Diagnosis not present

## 2019-05-18 DIAGNOSIS — M25521 Pain in right elbow: Secondary | ICD-10-CM | POA: Diagnosis not present

## 2019-05-18 DIAGNOSIS — R030 Elevated blood-pressure reading, without diagnosis of hypertension: Secondary | ICD-10-CM

## 2019-05-18 DIAGNOSIS — M542 Cervicalgia: Secondary | ICD-10-CM

## 2019-05-18 MED ORDER — BLOOD GLUCOSE MONITOR KIT: PACK | 0 refills | Status: DC

## 2019-05-18 MED ORDER — COOL BLOOD GLUCOSE TEST STRIPS VI STRP: ORAL_STRIP | 12 refills | Status: DC

## 2019-05-18 MED ORDER — LANCETS MISC: 3 refills | Status: DC

## 2019-05-18 NOTE — Telephone Encounter (Signed)
Spoke with patient she stated she fell about 2 weeks about tripped over her dog, she states now her arm is hurting its hard for her to lift a cup of coffee etc.   Scheduled patient to see Ramon Dredge today

## 2019-05-18 NOTE — Patient Instructions (Addendum)
For your area of pain will get xray of cspine, rt elbow, rt thumb, rt shoulder and bilateral hips.  Continue with voltaren gel. If pain persists and application impractical then consider nsaid or taper prednisone but will review cmp and a1c first.  For joint pains will get arthritis panel.  For elevated sugar princess faxed rt of your glucometer this morning.  For elevated blood pressure recommend getting otc bp cuff and check bp 3-5 times weekly. Want to see bp less than 140/90 on average.  Follow up in 2-3 weeks or as neeed

## 2019-05-18 NOTE — Progress Notes (Signed)
Subjective:    Patient ID: Kristina Lee, female    DOB: 19-Aug-1945, 74 y.o.   MRN: [id number]  HPI  Pt states 2 weeks ago she got up from couch. He dog up at same time. Her dog knocked her down. She really did not have much pain at that times. But 2 days has some minimal neck pain. Some bilateral upper ext pain. Point so shoulders all the way down to her rt thumb. Left side shoulder pain seems to radiate at most to her left forearm.  She has some rt hip area when she moves intermittently.  Pt did bump he upper lip/tooth area on floor and had left upper tooth pain for one day(then resolved)  Pt had on and of hip pain for 2 years.  Pt notes that her various pains came on 2 days after the fall and not immediately.  Review of Systems  Constitutional: Negative for chills, fatigue and fever.  Respiratory: Negative for chest tightness, shortness of breath and wheezing.   Cardiovascular: Negative for chest pain.  Gastrointestinal: Negative for abdominal pain.  Musculoskeletal:       See hpi.  Neurological: Negative for dizziness, speech difficulty, weakness and light-headedness.  Hematological: Negative for adenopathy. Does not bruise/bleed easily.    Past Medical History:  Diagnosis Date  . Diabetes mellitus without complication ([place])   . Heart attack ([place]) 09/07/2017  . Urticaria      Social History   Socioeconomic History  . Marital status: Married    Spouse name: Not on file  . Number of children: Not on file  . Years of education: Not on file  . Highest education level: Not on file  Occupational History  . Not on file  Tobacco Use  . Smoking status: Former Smoker    Packs/day: 0.50    Years: 24.00    Pack years: 12.00    Types: Cigarettes    Quit date: 04/17/1987    Years since quitting: 32.1  . Smokeless tobacco: Never Used  Substance and Sexual Activity  . Alcohol use: Not Currently  . Drug use: Not Currently  . Sexual activity: Yes  Other Topics Concern   . Not on file  Social History Narrative  . Not on file   Social Determinants of Health   Financial Resource Strain:   . Difficulty of Paying Living Expenses: Not on file  Food Insecurity:   . Worried About Charity fundraiser in the Last Year: Not on file  . Ran Out of Food in the Last Year: Not on file  Transportation Needs:   . Lack of Transportation (Medical): Not on file  . Lack of Transportation (Non-Medical): Not on file  Physical Activity:   . Days of Exercise per Week: Not on file  . Minutes of Exercise per Session: Not on file  Stress:   . Feeling of Stress : Not on file  Social Connections:   . Frequency of Communication with Friends and Family: Not on file  . Frequency of Social Gatherings with Friends and Family: Not on file  . Attends Religious Services: Not on file  . Active Member of Clubs or Organizations: Not on file  . Attends Archivist Meetings: Not on file  . Marital Status: Not on file  Intimate Partner Violence:   . Fear of Current or Ex-Partner: Not on file  . Emotionally Abused: Not on file  . Physically Abused: Not on file  . Sexually Abused: Not  on file    Past Surgical History:  Procedure Laterality Date  . ABDOMINAL HYSTERECTOMY  2006   b/l SPO and TAH, uterine cancer  . EYE SURGERY Bilateral 1960  . TUBAL LIGATION      Family History  Problem Relation Age of Onset  . Cancer Mother        liver cancer, breast cancer x 2   . Alcohol abuse Mother        smoker  . Heart disease Father        MI  . Alcohol abuse Father        smoker  . Depression Sister   . Tremor Sister   . Cancer Brother        pancreatic  . Diabetes Maternal Grandmother   . Hearing loss Maternal Grandmother   . Depression Maternal Grandmother   . Allergic rhinitis Neg Hx   . Angioedema Neg Hx   . Asthma Neg Hx   . Eczema Neg Hx   . Immunodeficiency Neg Hx   . Urticaria Neg Hx     Allergies  Allergen Reactions  . Amoxicillin Anaphylaxis  .  Hydroxyzine Itching and Other (See Comments)    Other reaction(s): Other (See Comments) Sedation Sedation Sedation   . Sulfa Antibiotics Swelling    Other reaction(s): Swelling tongue tongue tongue     Current Outpatient Medications on File Prior to Visit  Medication Sig Dispense Refill  . aspirin EC 81 MG tablet Take 81 mg by mouth daily.    Marland Kitchen atorvastatin (LIPITOR) 80 MG tablet TAKE 1 TABLET BY MOUTH EVERY DAY 90 tablet 1  . augmented betamethasone dipropionate (DIPROLENE-AF) 0.05 % cream Apply 1 application topically 2 (two) times daily.    . blood glucose meter kit and supplies KIT Dispense based on patient and insurance preference. Check blood sugars tid prn Dx; E11.9 1 each 0  . Calcium Carbonate-Vit D-Min (CALCIUM 600+D PLUS MINERALS) 600-400 MG-UNIT TABS Take 1 tablet by mouth daily.     . clopidogrel (PLAVIX) 75 MG tablet Take 1 tablet (75 mg total) by mouth daily. 90 tablet 3  . dexamethasone (DECADRON) 0.75 MG tablet SMARTSIG:3 Tablet(s) By Mouth Every Other Day PRN    . diclofenac sodium (VOLTAREN) 1 % GEL APPLY 4 G TOPICALLY 4 (FOUR) TIMES DAILY AS NEEDED FOR PAIN 500 g 1  . famotidine (PEPCID) 20 MG tablet Take 1 tablet (20 mg total) by mouth 2 (two) times daily as needed for heartburn or indigestion. 180 tablet 1  . fexofenadine (ALLEGRA) 180 MG tablet Take 1 tablet (180 mg total) by mouth 2 (two) times daily. 180 tablet 1  . fluticasone (FLONASE) 50 MCG/ACT nasal spray Place 2 sprays into both nostrils daily. 48 g 1  . glucose blood (COOL BLOOD GLUCOSE TEST STRIPS) test strip Use as instructedDispense based on patient and insurance preference. Check blood sugars tid prn Dx; E11.9 100 each 12  . Lancets MISC Dispense based on patient and insurance preference. Check blood sugars tid prn Dx; E11.9 100 each 3  . lisinopril (ZESTRIL) 2.5 MG tablet TAKE 1 TABLET BY MOUTH EVERY DAY 90 tablet 1  . minoxidil (ROGAINE) 2 % external solution Apply topically 2 (two) times daily. 60  mL 0  . neomycin-colistin-hydrocortisone-thonzonium (CORTISPORIN-TC) 3.06-16-08-0.5 MG/ML OTIC suspension Place 3 drops into the right ear 3 (three) times daily. 10 mL 0  . tacrolimus (PROTOPIC) 0.1 % ointment Apply 1 application topically 2 (two) times daily.     No  current facility-administered medications on file prior to visit.    BP (!) 146/75   Pulse 86   Temp (!) 96.1 F (35.6 C) (Temporal)   Resp 16   Ht 5' 2"  (1.575 m)   Wt 133 lb 9.6 oz (60.6 kg)   SpO2 98%   BMI 24.44 kg/m       Objective:   Physical Exam  General Mental Status- Alert. General Appearance- Not in acute distress.   Skin General: Color- Normal Color. Moisture- Normal Moisture.  Neck Carotid Arteries- Normal color. Moisture- Normal Moisture. No carotid bruits. No JVD. No mid cspine pain on palpation. but left side tenderness.  Shoulders- rt side pain on abuduction but not much on rom.  Rt elbow- lateral epicondyle tenderness to palpation.  Rt forearm- faint tender on rom. No brusing. No deformity.   Rt wrist- good rom. But rt thumb + finklestein test.  Rt wrsit- good rom. No pain. Left thumb no pain on rom and neg finkelstein test.  Chest and Lung Exam Auscultation: Breath Sounds:-Normal.  Cardiovascular Auscultation:Rythm- Regular. Murmurs & Other Heart Sounds:Auscultation of the heart reveals- No Murmurs.  Abdomen Inspection:-Inspeection Normal. Palpation/Percussion:Note:No mass. Palpation and Percussion of the abdomen reveal- Non Tender, Non Distended + BS, no rebound or guarding.    Neurologic Cranial Nerve exam:- CN III-XII intact(No nystagmus), symmetric smile. Drift Test:- No drift. Romberg Exam:- Negative.  Heal to Toe Gait exam:-Normal. Finger to Nose:- Normal/Intact Strength:- 5/5 equal and symmetric strength both upper and lower extremities.  Hips- no direct pain on palpation presently(but notes 2 years on and off pain.) some noticed more since fall.  Left shouder- good  rom. No pain on palpation.      Assessment & Plan:  For your area of pain will get xray of cspine, rt elbow, rt thumb, rt shoulder and bilateral hips.  Continue with voltaren gel. If pain persists and application impractical then consider nsaid or taper prednisone but will review cmp and a1c first.  For joint pains will get arthritis panel.  For elevated sugar princess faxed rt of your glucometer this morning.  For elevated blood pressure recommend getting otc bp cuff and check bp 3-5 times weekly. Want to see bp less than 140/90 on average.  Follow up in 2-3 weeks or as needed  40 minutes spent with pt. 50% of time spent counseling pt on plan going forward and answering questions.  Mackie Pai, PA-C

## 2019-05-19 LAB — COMPREHENSIVE METABOLIC PANEL
ALT: 19 U/L (ref 0–35)
AST: 29 U/L (ref 0–37)
Albumin: 4.4 g/dL (ref 3.5–5.2)
Alkaline Phosphatase: 58 U/L (ref 39–117)
BUN: 20 mg/dL (ref 6–23)
CO2: 29 mEq/L (ref 19–32)
Calcium: 9.8 mg/dL (ref 8.4–10.5)
Chloride: 104 mEq/L (ref 96–112)
Creatinine, Ser: 0.94 mg/dL (ref 0.40–1.20)
GFR: 58.26 mL/min — ABNORMAL LOW (ref >60.00)
Glucose, Bld: 109 mg/dL — ABNORMAL HIGH (ref 70–99)
Potassium: 3.9 mEq/L (ref 3.5–5.1)
Sodium: 141 mEq/L (ref 135–145)
Total Bilirubin: 1.4 mg/dL — ABNORMAL HIGH (ref 0.2–1.2)
Total Protein: 7.3 g/dL (ref 6.0–8.3)

## 2019-05-19 LAB — C-REACTIVE PROTEIN: CRP: <1 mg/dL (ref 0.5–20.0)

## 2019-05-19 LAB — HEMOGLOBIN A1C: Hgb A1c MFr Bld: 6 % (ref 4.6–6.5)

## 2019-05-19 LAB — SEDIMENTATION RATE: Sed Rate: 5 mm/hr (ref 0–30)

## 2019-05-20 ENCOUNTER — Telehealth: Payer: Self-pay | Admitting: Medical

## 2019-05-20 DIAGNOSIS — M2550 Pain in unspecified joint: Secondary | ICD-10-CM

## 2019-05-20 DIAGNOSIS — R768 Other specified abnormal immunological findings in serum: Secondary | ICD-10-CM

## 2019-05-20 LAB — ANA: Anti Nuclear Antibody (ANA): POSITIVE — AB

## 2019-05-20 LAB — ANTI-NUCLEAR AB-TITER (ANA TITER): ANA Titer 1: 1:40 {titer} — ABNORMAL HIGH

## 2019-05-20 LAB — RHEUMATOID FACTOR: Rheumatoid fact SerPl-aCnc: <14 IU/mL (ref <14)

## 2019-05-20 NOTE — Telephone Encounter (Signed)
  Referral to rheumatologist placed. 

## 2019-05-21 ENCOUNTER — Ambulatory Visit (HOSPITAL_BASED_OUTPATIENT_CLINIC_OR_DEPARTMENT_OTHER)
Admission: RE | Admit: 2019-05-21 | Discharge: 2019-05-21 | Disposition: A | Discharge status: Home / Self Care | Payer: Medicare PPO | Source: Ambulatory Visit | Attending: Medical | Admitting: Medical

## 2019-05-21 ENCOUNTER — Other Ambulatory Visit: Payer: Self-pay

## 2019-05-21 ENCOUNTER — Telehealth: Payer: Self-pay | Admitting: Family Medicine

## 2019-05-21 DIAGNOSIS — M25521 Pain in right elbow: Secondary | ICD-10-CM | POA: Diagnosis present

## 2019-05-21 DIAGNOSIS — M542 Cervicalgia: Secondary | ICD-10-CM | POA: Diagnosis present

## 2019-05-21 DIAGNOSIS — M25559 Pain in unspecified hip: Secondary | ICD-10-CM

## 2019-05-21 DIAGNOSIS — M79644 Pain in right finger(s): Secondary | ICD-10-CM | POA: Insufficient documentation

## 2019-05-21 DIAGNOSIS — M25511 Pain in right shoulder: Secondary | ICD-10-CM | POA: Diagnosis present

## 2019-05-21 NOTE — Telephone Encounter (Signed)
Caller name: Relation to pt: Call back number:[phone] Pharmacy:  Reason for call:  Pt is requesting to speak to Dr. Abner Greenspan pt states she has questions about the covid vaccine prior to making an appt

## 2019-05-22 NOTE — Telephone Encounter (Signed)
Spoke with patient message below

## 2019-05-26 ENCOUNTER — Other Ambulatory Visit: Payer: Self-pay

## 2019-05-26 ENCOUNTER — Ambulatory Visit: Payer: Medicare PPO

## 2019-05-26 DIAGNOSIS — R739 Hyperglycemia, unspecified: Secondary | ICD-10-CM

## 2019-05-26 MED ORDER — CLOPIDOGREL BISULFATE 75 MG PO TABS: 75.0000 mg | ORAL_TABLET | Freq: Every day | ORAL | 3 refills | Status: DC

## 2019-05-26 NOTE — Progress Notes (Signed)
Patient here today for glucose teaching. Patient taught and understood. Glucose reading 108 fasting.

## 2019-05-27 MED ORDER — ACCU-CHEK SOFTCLIX LANCETS MISC: 12 refills | Status: DC

## 2019-05-27 NOTE — Addendum Note (Signed)
Addended by: Steve Rattler A on: 05/27/2019 07:14 AM   Modules accepted: Orders

## 2019-06-08 NOTE — Progress Notes (Signed)
Office Visit Note  Patient: Kristina Lee             Date of Birth: 10/15/1945           MRN: [id number]             PCP: Kristina Lukes, MD Referring: Kristina Lee Visit Date: 06/11/2019 Occupation: @GUAROCC @  Subjective:  Pain in multiple joints.   History of Present Illness: Kristina Lee is a 74 y.o. female seen in consultation per request of her PCP.  According to patient on April 22, 2019 she was watching TV and suddenly collided into her dog and fell.  She states she landed up on her left side and then right side.  She states she waited for 3 days and her symptoms got worse.  She has been having pain and discomfort in her cervical spine, lumbar spine, right shoulder, right elbow, right hand and her both hips.  She states after 3 days of waiting she had a televisit with her PCP.  She was given some medications without good response.  On May 21, 2019 she had a visit with her PCP again and had x-rays done.  I reviewed the x-rays today the x-rays were done of her cervical spine, right shoulder, right elbow, right thumb and her bilateral hips which were read unremarkable and negative for fracture.  She had degenerative disc disease in cervical spine by report.  She states she continues to have discomfort in her cervical spine.  She has discomfort in her right shoulder right elbow and her right hand.  She is having difficulty holding objects.  She also has history of degenerative disease of lumbar spine and sciatica.  She states her symptoms have recurred now.  She recently also had some lab work done by her PCP which showed positive ANA for that reason she was referred to me.  Activities of Daily Living:  Patient reports morning stiffness for 24 hours.   Patient Reports nocturnal pain.  Difficulty dressing/grooming: Denies Difficulty climbing stairs: Denies Difficulty getting out of chair: Reports Difficulty using hands for taps, buttons, cutlery, and/or writing:  Reports  Review of Systems  Constitutional: Negative for fatigue, night sweats, weight gain and weight loss.  HENT: Negative for mouth sores, trouble swallowing, trouble swallowing, mouth dryness and nose dryness.   Eyes: Positive for dryness. Negative for pain, redness and visual disturbance.  Respiratory: Negative for cough, shortness of breath and difficulty breathing.   Cardiovascular: Negative for chest pain, palpitations, hypertension, irregular heartbeat and swelling in legs/feet.  Gastrointestinal: Positive for constipation and diarrhea. Negative for blood in stool.  Endocrine: Negative for increased urination.  Genitourinary: Negative for difficulty urinating, painful urination and vaginal dryness.  Musculoskeletal: Positive for arthralgias, joint pain, myalgias, morning stiffness and myalgias. Negative for joint swelling, muscle weakness and muscle tenderness.  Skin: Positive for hair loss. Negative for color change, rash, skin tightness, ulcers and sensitivity to sunlight.  Allergic/Immunologic: Negative for susceptible to infections.  Neurological: Negative for dizziness, numbness, headaches, memory loss, night sweats and weakness.  Hematological: Positive for bruising/bleeding tendency. Negative for swollen glands.  Psychiatric/Behavioral: Positive for sleep disturbance. Negative for depressed mood and confusion. The patient is not nervous/anxious.     PMFS History:  Patient Active Problem List   Diagnosis Date Noted  . Hair loss 08/10/2018  . Hearing loss 08/10/2018  . Otitis externa 08/10/2018  . Varicose veins of right lower extremity with complications 59/29/2446  . Vascular dementia  with behavior disturbance ([place]) 02/24/2018  . Myalgia 02/09/2018  . Seasonal allergic rhinitis due to pollen 01/21/2018  . Cough 12/23/2017  . Allergic urticaria 12/23/2017  . History of myocardial infarction in last year 12/23/2017  . Diabetes mellitus ([facility] [facility]) 12/23/2017  . Rash and  nonspecific skin eruption 12/23/2017  . Intrinsic atopic dermatitis 12/23/2017  . CAD (coronary artery disease) 09/25/2017  . Low back pain 09/25/2017  . Gastroesophageal reflux disease with esophagitis 09/04/2017  . Hyperlipidemia associated with type 2 diabetes mellitus ([place]) 12/31/2016  . Hypokalemia 10/28/2015  . DDD (degenerative disc disease), lumbosacral 09/13/2015  . Diverticulosis of large intestine 09/13/2015  . Seborrheic dermatitis of scalp 09/13/2015  . Chronic kidney disease, stage III (moderate) 05/02/2015  . Allergic rhinitis 07/14/2014  . Essential hypertension 07/14/2014  . History of uterine cancer 07/14/2014    Past Medical History:  Diagnosis Date  . Diabetes mellitus without complication ([place])   . Heart attack ([place]) 09/07/2017  . Urticaria     Family History  Problem Relation Age of Onset  . Cancer Mother        liver cancer, breast cancer x 2   . Alcohol abuse Mother        smoker  . Heart disease Father        MI  . Alcohol abuse Father        smoker  . Depression Sister   . Tremor Sister   . Cancer Brother        pancreatic  . Healthy Son   . Diabetes Maternal Grandmother   . Hearing loss Maternal Grandmother   . Depression Maternal Grandmother   . Allergic rhinitis Neg Hx   . Angioedema Neg Hx   . Asthma Neg Hx   . Eczema Neg Hx   . Immunodeficiency Neg Hx   . Urticaria Neg Hx    Past Surgical History:  Procedure Laterality Date  . ABDOMINAL HYSTERECTOMY  2006   b/l SPO and TAH, uterine cancer  . EYE SURGERY Right 1960  . TUBAL LIGATION     Social History   Social History Narrative  . Not on file   Immunization History  Administered Date(s) Administered  . Influenza, High Dose Seasonal PF 01/18/2015, 02/02/2016, 01/17/2017, 12/31/2017  . Influenza,inj,Quad PF,6+ Mos 02/14/2014  . Influenza-Unspecified 12/31/2017  . Pneumococcal Conjugate-13 05/27/2013, 05/27/2013  . Pneumococcal Polysaccharide-23 04/16/2014, 04/16/2014  . Tdap  07/14/2014, 07/14/2014  . Zoster 04/17/2011, 04/17/2011     Objective: Vital Signs: BP (!) 172/81 (BP Location: Left Arm, Patient Position: Sitting, Cuff Size: Normal)   Pulse (!) 57   Resp 14   Ht 5' 1.5" (1.562 m)   Wt 131 lb (59.4 kg)   BMI 24.35 kg/m    Physical Exam Vitals and nursing note reviewed.  Constitutional:      Appearance: She is well-developed.  HENT:     Head: Normocephalic and atraumatic.  Eyes:     Conjunctiva/sclera: Conjunctivae normal.  Cardiovascular:     Rate and Rhythm: Normal rate and regular rhythm.     Heart sounds: Normal heart sounds.  Pulmonary:     Effort: Pulmonary effort is normal.     Breath sounds: Normal breath sounds.  Abdominal:     General: Bowel sounds are normal.     Palpations: Abdomen is soft.  Musculoskeletal:     Cervical back: Normal range of motion.  Lymphadenopathy:     Cervical: No cervical adenopathy.  Skin:    General: Skin  is warm and dry.     Capillary Refill: Capillary refill takes less than 2 seconds.  Neurological:     Mental Status: She is alert and oriented to person, place, and time.  Psychiatric:        Behavior: Behavior normal.      Musculoskeletal Exam: C-spine was in good range of motion without radiculopathy.  She has some discomfort range of motion of her lumbar spine.  She had good range of motion of bilateral shoulder joints without any point tenderness.  She has some discomfort on palpation of her right arm.  She has tenderness on palpation of her right forearm.  But no synovitis was noted in her elbow joint.  She has some tenderness over her right CMC joint.  No synovitis was noted.  She has PIP and DIP thickening in her hands consistent with osteoarthritis.  She has discomfort range of motion of bilateral hip joints.  Knee joints ankles MTPs with good range of motion with no synovitis.  CDAI Exam: CDAI Score: -- Patient Global: --; Provider Global: -- Swollen: --; Tender: -- Joint Exam 06/11/2019    No joint exam has been documented for this visit   There is currently no information documented on the homunculus. Go to the Rheumatology activity and complete the homunculus joint exam.  Investigation: No additional findings.  Imaging: DG Cervical Spine Complete  Result Date: 05/21/2019 CLINICAL DATA:  Neck pain since a fall on 05/07/2019. EXAM: CERVICAL SPINE - COMPLETE 4+ VIEW COMPARISON:  None. FINDINGS: There is no fracture subluxation. Prevertebral soft tissues are normal. Slight narrowing of the C5-6 disc space. Minimal uncinate spurs to the right at C5-6 without foraminal stenosis. No facet arthritis. Benign chronic calcification in the nuchal ligament at C5. IMPRESSION: No acute abnormality. Minimal degenerative disc disease at C5-6. Electronically Signed   By: Lorriane Shire M.D.   On: 05/21/2019 14:31   DG Shoulder Right  Result Date: 05/21/2019 CLINICAL DATA:  Acute right shoulder pain since a fall on 05/07/2019. EXAM: RIGHT SHOULDER - 2+ VIEW COMPARISON:  None. FINDINGS: There is no evidence of fracture or dislocation. There is no evidence of arthropathy or other focal bone abnormality. Soft tissues are unremarkable. IMPRESSION: Negative. Electronically Signed   By: Lorriane Shire M.D.   On: 05/21/2019 14:27   DG Elbow Complete Right  Result Date: 05/21/2019 CLINICAL DATA:  Right elbow pain since a fall on 05/07/2019 EXAM: RIGHT ELBOW - COMPLETE 3+ VIEW COMPARISON:  None. FINDINGS: There is no evidence of fracture, dislocation, or joint effusion. There is no evidence of arthropathy or other focal bone abnormality. Soft tissues are unremarkable. IMPRESSION: Negative. Electronically Signed   By: Lorriane Shire M.D.   On: 05/21/2019 14:27   DG Finger Thumb Right  Result Date: 05/21/2019 CLINICAL DATA:  Right thumb pain since a fall on 05/07/2019. EXAM: RIGHT THUMB 2+V COMPARISON:  None. FINDINGS: There is no evidence of fracture or dislocation. There is no evidence of arthropathy or  other focal bone abnormality. Soft tissues are unremarkable. IMPRESSION: Negative. Electronically Signed   By: Lorriane Shire M.D.   On: 05/21/2019 14:28   DG HIPS BILAT WITH PELVIS 2V  Result Date: 05/21/2019 CLINICAL DATA:  Bilateral hip pain and bilateral buttock pain since a fall on 05/07/2019. EXAM: DG HIP (WITH OR WITHOUT PELVIS) 2V BILAT COMPARISON:  None. FINDINGS: There is no evidence of hip fracture or dislocation. There is no evidence of arthropathy or other focal bone abnormality. IMPRESSION: Negative.  Electronically Signed   By: Lorriane Shire M.D.   On: 05/21/2019 14:29    Recent Labs: Lab Results  Component Value Date   WBC 5.6 04/04/2018   HGB 14.3 04/04/2018   PLT 212.0 04/04/2018   NA 141 05/18/2019   K 3.9 05/18/2019   CL 104 05/18/2019   CO2 29 05/18/2019   GLUCOSE 109 (H) 05/18/2019   BUN 20 05/18/2019   CREATININE 0.94 05/18/2019   BILITOT 1.4 (H) 05/18/2019   ALKPHOS 58 05/18/2019   AST 29 05/18/2019   ALT 19 05/18/2019   PROT 7.3 05/18/2019   ALBUMIN 4.4 05/18/2019   CALCIUM 9.8 05/18/2019   GFRAA 53 (L) 03/12/2018    Speciality Comments: No specialty comments available.  Procedures:  No procedures performed Allergies: Amoxicillin, Hydroxyzine, and Sulfa antibiotics   Assessment / Plan:     Visit Diagnoses: Positive ANA (antinuclear antibody) -patient has no clinical features of autoimmune disease.  ANA is only 1: 40 homogeneous which is nonspecific and not significant.  05/18/19: ANA 1:40 NH, ESR 5, RF<14, CRP<1  Polyarthralgia-she has been experiencing increased pain and discomfort since her fall.  She has pain and discomfort in her right shoulder and right elbow joint.  I reviewed x-rays which were unremarkable.  She also complains of pain and discomfort in her bilateral hip joints.  She has some limitation with range of motion.  She may have mild osteoarthritis in her hip joints.  DDD (degenerative disc disease), cervical-according to the radiology  report she has some degenerative changes in the cervical spine.  She is having ongoing discomfort in her cervical spine.  DDD (degenerative disc disease), lumbosacral-she has degenerative disease in her lumbar spine and has been experiencing some sciatica symptoms which has been worse since her fall.  I will refer her to orthopedics.  Primary osteoarthritis of both hands-clinical findings are consistent with osteoarthritis.  She states she has been having difficulty lifting objects.  Most likely the changes are posttraumatic and will take time.  Other medical problems are listed as follows:  Seborrheic dermatitis of scalp  Intrinsic atopic dermatitis  Hair loss  Coronary artery disease involving native coronary artery of native heart without angina pectoris  Varicose veins of right lower extremity with complications  Hyperlipidemia associated with type 2 diabetes mellitus ([place])  Vascular dementia with behavior disturbance ([facility])  Allergic urticaria  Stage 3 chronic kidney disease, unspecified whether stage 3a or 3b CKD  History of uterine cancer  History of myocardial infarction in last year  History of diverticulosis  History of gastroesophageal reflux (GERD)  Orders: Orders Placed This Encounter  Procedures  . Ambulatory referral to Orthopedic Surgery   No orders of the defined types were placed in this encounter.   Face-to-face time spent with patient was 50 minutes. Greater than 50% of time was spent in counseling and coordination of care.  Follow-Up Instructions: Return if symptoms worsen or fail to improve, for Osteoarthritis.   Bo Merino, MD  Note - This record has been created using Editor, commissioning.  Chart creation errors have been sought, but may not always  have been located. Such creation errors do not reflect on  the standard of medical care.

## 2019-06-11 ENCOUNTER — Encounter: Payer: Self-pay | Admitting: Rheumatology

## 2019-06-11 ENCOUNTER — Other Ambulatory Visit: Payer: Self-pay

## 2019-06-11 ENCOUNTER — Ambulatory Visit: Payer: Medicare PPO | Admitting: Rheumatology

## 2019-06-11 VITALS — BP 172/81 | HR 57 | Resp 14 | Ht 61.5 in | Wt 131.0 lb

## 2019-06-11 DIAGNOSIS — E785 Hyperlipidemia, unspecified: Secondary | ICD-10-CM

## 2019-06-11 DIAGNOSIS — M5030 Other cervical disc degeneration, unspecified cervical region: Secondary | ICD-10-CM

## 2019-06-11 DIAGNOSIS — L659 Nonscarring hair loss, unspecified: Secondary | ICD-10-CM

## 2019-06-11 DIAGNOSIS — I252 Old myocardial infarction: Secondary | ICD-10-CM

## 2019-06-11 DIAGNOSIS — M5137 Other intervertebral disc degeneration, lumbosacral region: Secondary | ICD-10-CM | POA: Diagnosis not present

## 2019-06-11 DIAGNOSIS — M19041 Primary osteoarthritis, right hand: Secondary | ICD-10-CM

## 2019-06-11 DIAGNOSIS — M2550 Pain in unspecified joint: Secondary | ICD-10-CM

## 2019-06-11 DIAGNOSIS — F0151 Vascular dementia with behavioral disturbance: Secondary | ICD-10-CM

## 2019-06-11 DIAGNOSIS — I2510 Atherosclerotic heart disease of native coronary artery without angina pectoris: Secondary | ICD-10-CM

## 2019-06-11 DIAGNOSIS — M51379 Other intervertebral disc degeneration, lumbosacral region without mention of lumbar back pain or lower extremity pain: Secondary | ICD-10-CM

## 2019-06-11 DIAGNOSIS — R768 Other specified abnormal immunological findings in serum: Secondary | ICD-10-CM | POA: Diagnosis not present

## 2019-06-11 DIAGNOSIS — L500 Allergic urticaria: Secondary | ICD-10-CM

## 2019-06-11 DIAGNOSIS — L2084 Intrinsic (allergic) eczema: Secondary | ICD-10-CM

## 2019-06-11 DIAGNOSIS — M19042 Primary osteoarthritis, left hand: Secondary | ICD-10-CM

## 2019-06-11 DIAGNOSIS — Z8719 Personal history of other diseases of the digestive system: Secondary | ICD-10-CM

## 2019-06-11 DIAGNOSIS — Z8542 Personal history of malignant neoplasm of other parts of uterus: Secondary | ICD-10-CM

## 2019-06-11 DIAGNOSIS — E1169 Type 2 diabetes mellitus with other specified complication: Secondary | ICD-10-CM

## 2019-06-11 DIAGNOSIS — F01518 Vascular dementia, unspecified severity, with other behavioral disturbance: Secondary | ICD-10-CM

## 2019-06-11 DIAGNOSIS — N183 Chronic kidney disease, stage 3 unspecified: Secondary | ICD-10-CM

## 2019-06-11 DIAGNOSIS — L219 Seborrheic dermatitis, unspecified: Secondary | ICD-10-CM

## 2019-06-11 DIAGNOSIS — I83891 Varicose veins of right lower extremities with other complications: Secondary | ICD-10-CM

## 2019-06-11 DIAGNOSIS — R7689 Other specified abnormal immunological findings in serum: Secondary | ICD-10-CM

## 2019-06-12 ENCOUNTER — Other Ambulatory Visit: Payer: Self-pay

## 2019-06-12 ENCOUNTER — Telehealth: Payer: Self-pay

## 2019-06-12 MED ORDER — BETAMETHASONE DIPROPIONATE AUG 0.05 % EX CREA: 1.0000 "application " | TOPICAL_CREAM | Freq: Two times a day (BID) | CUTANEOUS | 0 refills | Status: DC

## 2019-06-12 NOTE — Telephone Encounter (Signed)
Sent in refill/thx dmf 

## 2019-06-12 NOTE — Telephone Encounter (Signed)
Patient called in to see if Dr. Abner Greenspan could send in a prescription for augmented betamethasone dipropionate (DIPROLENE-AF) 0.05 % cream [[id number]]

## 2019-06-18 ENCOUNTER — Ambulatory Visit: Payer: Medicare PPO | Attending: Internal Medicine

## 2019-06-18 DIAGNOSIS — Z23 Encounter for immunization: Secondary | ICD-10-CM

## 2019-06-18 NOTE — Progress Notes (Signed)
   Covid-19 Vaccination Clinic  Name:  Kristina Lee    MRN: [id number] DOB: [birth year]  06/18/2019  Kristina Lee was observed post Covid-19 immunization for 30 minutes based on pre-vaccination screening without incident. She was provided with Vaccine Information Sheet and instruction to access the V-Safe system.   Kristina Lee was instructed to call 911 with any severe reactions post vaccine: Marland Kitchen Difficulty breathing  . Swelling of face and throat  . A fast heartbeat  . A bad rash all over body  . Dizziness and weakness   Immunizations Administered    Name Date Dose VIS Date Route   Pfizer COVID-19 Vaccine 06/18/2019 12:11 PM 0.3 mL 03/27/2019 Intramuscular   Manufacturer: [employer], [employer]   Lot: [id number]   NDC: [address]-6

## 2019-07-02 DIAGNOSIS — H53031 Strabismic amblyopia, right eye: Secondary | ICD-10-CM | POA: Diagnosis not present

## 2019-07-02 DIAGNOSIS — E119 Type 2 diabetes mellitus without complications: Secondary | ICD-10-CM | POA: Diagnosis not present

## 2019-07-02 DIAGNOSIS — H25013 Cortical age-related cataract, bilateral: Secondary | ICD-10-CM | POA: Diagnosis not present

## 2019-07-02 DIAGNOSIS — H11151 Pinguecula, right eye: Secondary | ICD-10-CM | POA: Diagnosis not present

## 2019-07-02 DIAGNOSIS — H5010 Unspecified exotropia: Secondary | ICD-10-CM | POA: Diagnosis not present

## 2019-07-02 DIAGNOSIS — H2513 Age-related nuclear cataract, bilateral: Secondary | ICD-10-CM | POA: Diagnosis not present

## 2019-07-09 ENCOUNTER — Other Ambulatory Visit: Payer: Self-pay

## 2019-07-09 ENCOUNTER — Ambulatory Visit: Payer: Self-pay

## 2019-07-09 ENCOUNTER — Ambulatory Visit: Payer: Medicare PPO | Admitting: Specialist

## 2019-07-09 ENCOUNTER — Telehealth: Payer: Self-pay | Admitting: Family Medicine

## 2019-07-09 ENCOUNTER — Encounter: Payer: Self-pay | Admitting: Specialist

## 2019-07-09 ENCOUNTER — Ambulatory Visit: Payer: Medicare PPO | Admitting: Rheumatology

## 2019-07-09 VITALS — BP 194/90 | HR 61 | Ht 61.5 in | Wt 131.0 lb

## 2019-07-09 DIAGNOSIS — S53401A Unspecified sprain of right elbow, initial encounter: Secondary | ICD-10-CM | POA: Diagnosis not present

## 2019-07-09 DIAGNOSIS — M79601 Pain in right arm: Secondary | ICD-10-CM | POA: Diagnosis not present

## 2019-07-09 DIAGNOSIS — M545 Low back pain, unspecified: Secondary | ICD-10-CM

## 2019-07-09 DIAGNOSIS — M4722 Other spondylosis with radiculopathy, cervical region: Secondary | ICD-10-CM | POA: Diagnosis not present

## 2019-07-09 MED ORDER — TRAMADOL-ACETAMINOPHEN 37.5-325 MG PO TABS: 1.0000 | ORAL_TABLET | Freq: Four times a day (QID) | ORAL | 0 refills | Status: DC | PRN

## 2019-07-09 MED ORDER — GABAPENTIN 100 MG PO CAPS: 100.0000 mg | ORAL_CAPSULE | Freq: Every day | ORAL | 1 refills | Status: DC

## 2019-07-09 NOTE — Telephone Encounter (Signed)
Her chart says she is allergic to Hydroxyzine not Hydralazine. Is she allergic to both? Do we have the wrong one in the chart? For her elevated BP increase her Lisinopril to 5 mg bid disp #60 with 1 rf and recheck bp and get a cmp in 2 weeks

## 2019-07-09 NOTE — Telephone Encounter (Signed)
Patient states that she is allergic to hydralazine.

## 2019-07-09 NOTE — Progress Notes (Signed)
Office Visit Note   Patient: Kristina Lee           Date of Birth: Aug 27, 1945           MRN: [id number] Visit Date: 07/09/2019              Requested by: Bo Merino, MD [address],  [place] [address] PCP: Mosie Lukes, MD   Assessment & Plan: Visit Diagnoses:  1. Low back pain, unspecified back pain laterality, unspecified chronicity, unspecified whether sciatica present   2. Elbow sprain, right, initial encounter   3. Other spondylosis with radiculopathy, cervical region   4. Right arm pain     Plan: Continue to move the right elbow as tolerated. Heat or ice as tolerated for pain. Start gabapentin at night 100 mg. Tramadol or ultracet every 6 hours as need for pain. MRI of the right elbow to assess for occult injury to the right elbow, ligament or bone trauma. EMG/NCV right arm for radial neuropathy vs C6 radiculopathy. Do not take arthritis over the counter meds like motrin, ibuprofen or naproxyn or alleve.  Follow-Up Instructions: Return in about 1 week (around 07/16/2019) for overbook for in 7-12 days.   Orders:  Orders Placed This Encounter  Procedures  . XR Lumbar Spine 2-3 Views  . MR Elbow Right w/ contrast  . Ambulatory referral to Physical Medicine Rehab   Meds ordered this encounter  Medications  . gabapentin (NEURONTIN) 100 MG capsule    Sig: Take 1 capsule (100 mg total) by mouth at bedtime.    Dispense:  30 capsule    Refill:  1  . traMADol-acetaminophen (ULTRACET) 37.5-325 MG tablet    Sig: Take 1 tablet by mouth every 6 (six) hours as needed.    Dispense:  30 tablet    Refill:  0      Procedures: No procedures performed   Clinical Data: No additional findings.   Subjective: Chief Complaint  Patient presents with  . Neck - Pain  . Right Arm - Pain    8 year od female with history of recent fall 04/22/2019 82 days ago. She reports that family was at her home and she went to get up and both her  granddaughter 34 year old and her dog ran into from the side ans she had to fall. She fell straight down and landed on the arms. She  Was seen in the ER on 05/21/2019 and had xrays of her shoulder and right thumb and hips and neck.    Review of Systems  Constitutional: Negative.   HENT: Positive for congestion, hearing loss, postnasal drip, rhinorrhea, sinus pressure and sinus pain. Negative for dental problem, drooling, ear discharge, ear pain, facial swelling, mouth sores, sneezing, sore throat, tinnitus, trouble swallowing and voice change.   Eyes: Negative for photophobia, pain, redness, itching and visual disturbance.  Respiratory: Negative.  Negative for apnea, cough, choking, chest tightness, shortness of breath, wheezing and stridor.   Cardiovascular: Negative.  Negative for chest pain, palpitations and leg swelling.  Gastrointestinal: Negative.  Negative for abdominal distention, abdominal pain, anal bleeding, blood in stool, constipation, diarrhea, nausea, rectal pain and vomiting.  Endocrine: Negative for cold intolerance, heat intolerance, polydipsia, polyphagia and polyuria.  Allergic/Immunologic: Positive for environmental allergies.     Objective: Vital Signs: BP (!) 194/90 (BP Location: Left Arm, Patient Position: Sitting)   Pulse 61   Ht 5' 1.5" (1.562 m)   Wt 131 lb (59.4  kg)   BMI 24.35 kg/m   Physical Exam  Ortho Exam  Specialty Comments:  No specialty comments available.  Imaging: No results found.   PMFS History: Patient Active Problem List   Diagnosis Date Noted  . Hair loss 08/10/2018  . Hearing loss 08/10/2018  . Otitis externa 08/10/2018  . Varicose veins of right lower extremity with complications 05/08/2018  . Vascular dementia with behavior disturbance (HCC) 02/24/2018  . Myalgia 02/09/2018  . Seasonal allergic rhinitis due to pollen 01/21/2018  . Cough 12/23/2017  . Allergic urticaria 12/23/2017  . History of myocardial infarction in last year  12/23/2017  . Diabetes mellitus (HCC) 12/23/2017  . Rash and nonspecific skin eruption 12/23/2017  . Intrinsic atopic dermatitis 12/23/2017  . CAD (coronary artery disease) 09/25/2017  . Low back pain 09/25/2017  . Gastroesophageal reflux disease with esophagitis 09/04/2017  . Hyperlipidemia associated with type 2 diabetes mellitus (HCC) 12/31/2016  . Hypokalemia 10/28/2015  . DDD (degenerative disc disease), lumbosacral 09/13/2015  . Diverticulosis of large intestine 09/13/2015  . Seborrheic dermatitis of scalp 09/13/2015  . Chronic kidney disease, stage III (moderate) 05/02/2015  . Allergic rhinitis 07/14/2014  . Essential hypertension 07/14/2014  . History of uterine cancer 07/14/2014   Past Medical History:  Diagnosis Date  . Diabetes mellitus without complication (HCC)   . Heart attack (HCC) 09/07/2017  . Urticaria     Family History  Problem Relation Age of Onset  . Cancer Mother        liver cancer, breast cancer x 2   . Alcohol abuse Mother        smoker  . Heart disease Father        MI  . Alcohol abuse Father        smoker  . Depression Sister   . Tremor Sister   . Cancer Brother        pancreatic  . Healthy Son   . Diabetes Maternal Grandmother   . Hearing loss Maternal Grandmother   . Depression Maternal Grandmother   . Allergic rhinitis Neg Hx   . Angioedema Neg Hx   . Asthma Neg Hx   . Eczema Neg Hx   . Immunodeficiency Neg Hx   . Urticaria Neg Hx     Past Surgical History:  Procedure Laterality Date  . ABDOMINAL HYSTERECTOMY  2006   b/l SPO and TAH, uterine cancer  . EYE SURGERY Right 1960  . TUBAL LIGATION     Social History   Occupational History  . Not on file  Tobacco Use  . Smoking status: Former Smoker    Packs/day: 0.50    Years: 24.00    Pack years: 12.00    Types: Cigarettes    Quit date: 04/17/1987    Years since quitting: 32.2  . Smokeless tobacco: Never Used  Substance and Sexual Activity  . Alcohol use: Yes    Comment:  occ  . Drug use: Not Currently  . Sexual activity: Yes

## 2019-07-09 NOTE — Telephone Encounter (Signed)
Caller: Kristina Lee Call Back # [phone]  Patient called in to state that her BP is elevated  concern.   BP Readings    05/18/2019, 146/75  07/09/2019, 194/90

## 2019-07-09 NOTE — Patient Instructions (Signed)
Continue to move the right elbow as tolerated. Heat or ice as tolerated for pain. Start gabapentin at night 100 mg. Tramadol or ultracet every 6 hours as need for pain. MRI of the right elbow to assess for occult injury to the right elbow, ligament or bone trauma. EMG/NCV right arm for radial neuropathy vs C6 radiculopathy. Do not take arthritis over the counter meds like motrin, ibuprofen or naproxyn or alleve.

## 2019-07-10 NOTE — Telephone Encounter (Signed)
Left message on machine to call back  

## 2019-07-13 ENCOUNTER — Other Ambulatory Visit: Payer: Self-pay

## 2019-07-13 ENCOUNTER — Ambulatory Visit: Payer: Medicare PPO | Admitting: Family Medicine

## 2019-07-13 VITALS — BP 153/66 | HR 57 | Temp 98.2°F | Resp 12 | Ht 62.0 in | Wt 136.0 lb

## 2019-07-13 DIAGNOSIS — E1169 Type 2 diabetes mellitus with other specified complication: Secondary | ICD-10-CM

## 2019-07-13 DIAGNOSIS — I10 Essential (primary) hypertension: Secondary | ICD-10-CM | POA: Diagnosis not present

## 2019-07-13 DIAGNOSIS — M79601 Pain in right arm: Secondary | ICD-10-CM | POA: Diagnosis not present

## 2019-07-13 DIAGNOSIS — N183 Chronic kidney disease, stage 3 unspecified: Secondary | ICD-10-CM | POA: Diagnosis not present

## 2019-07-13 DIAGNOSIS — E785 Hyperlipidemia, unspecified: Secondary | ICD-10-CM

## 2019-07-13 MED ORDER — LISINOPRIL 5 MG PO TABS: 5.0000 mg | ORAL_TABLET | Freq: Two times a day (BID) | ORAL | 1 refills | Status: DC

## 2019-07-13 NOTE — Telephone Encounter (Signed)
Patient was in office today to discuss.

## 2019-07-13 NOTE — Patient Instructions (Addendum)
Fasting sugars 80-130  After eating sugars 100- 150   Check sugars daily and as needed, but at least twice a week once stable. Once fasting and once after largest meal  [employer] Blood Pressure cuff, upper arm, want BP 100-140/60-90 Pulse oximeter, want oxygen in 90s  Weekly vitals  Take Multivitamin with minerals, selenium Vitamin D 1000-2000 IU daily Probiotic with lactobacillus and bifidophilus Asprin EC 81 mg daily Fish or krill oil caps daily Melatonin 2-5 mg at bedtime  [email or web address]  Change your calcium from calcium carbonate to calcium citrate take 1 tab twice a day  Increase fluids to 60-80 daily   Encouraged increased hydration and fiber in diet. Daily probiotics. If bowels not moving can use MOM 2 tbls po in 4 oz of warm prune juice by mouth every 2-3 days. If no results then repeat in 4 hours with  Dulcolax suppository pr, may repeat again in 4 more hours as needed. Seek care if symptoms worsen. Consider daily Miralax and/or Dulcolax if symptoms persist.   Consider Daily Miralax and Benefiber once to twice daily for constipation  Carbohydrate Counting for Diabetes Mellitus, Adult  Carbohydrate counting is a method of keeping track of how many carbohydrates you eat. Eating carbohydrates naturally increases the amount of sugar (glucose) in the blood. Counting how many carbohydrates you eat helps keep your blood glucose within normal limits, which helps you manage your diabetes (diabetes mellitus). It is important to know how many carbohydrates you can safely have in each meal. This is different for every person. A diet and nutrition specialist (registered dietitian) can help you make a meal plan and calculate how many carbohydrates you should have at each meal and snack. Carbohydrates are found in the following foods:  Grains, such as breads and cereals.  Dried beans and soy products.  Starchy vegetables, such as potatoes, peas, and  corn.  Fruit and fruit juices.  Milk and yogurt.  Sweets and snack foods, such as cake, cookies, candy, chips, and soft drinks. How do I count carbohydrates? There are two ways to count carbohydrates in food. You can use either of the methods or a combination of both. Reading "Nutrition Facts" on packaged food The "Nutrition Facts" list is included on the labels of almost all packaged foods and beverages in the [place] It includes:  The serving size.  Information about nutrients in each serving, including the grams (g) of carbohydrate per serving. To use the "Nutrition Facts":  Decide how many servings you will have.  Multiply the number of servings by the number of carbohydrates per serving.  The resulting number is the total amount of carbohydrates that you will be having. Learning standard serving sizes of other foods When you eat carbohydrate foods that are not packaged or do not include "Nutrition Facts" on the label, you need to measure the servings in order to count the amount of carbohydrates:  Measure the foods that you will eat with a food scale or measuring cup, if needed.  Decide how many standard-size servings you will eat.  Multiply the number of servings by 15. Most carbohydrate-rich foods have about 15 g of carbohydrates per serving. ? For example, if you eat 8 oz (170 g) of strawberries, you will have eaten 2 servings and 30 g of carbohydrates (2 servings x 15 g = 30 g).  For foods that have more than one food mixed, such as soups and casseroles, you must count the carbohydrates in each food that is included.  The following list contains standard serving sizes of common carbohydrate-rich foods. Each of these servings has about 15 g of carbohydrates:   hamburger bun or  English muffin.   oz (15 mL) syrup.   oz (14 g) jelly.  1 slice of bread.  1 six-inch tortilla.  3 oz (85 g) cooked rice or pasta.  4 oz (113 g) cooked dried beans.  4 oz (113 g)  starchy vegetable, such as peas, corn, or potatoes.  4 oz (113 g) hot cereal.  4 oz (113 g) mashed potatoes or  of a large baked potato.  4 oz (113 g) canned or frozen fruit.  4 oz (120 mL) fruit juice.  4-6 crackers.  6 chicken nuggets.  6 oz (170 g) unsweetened dry cereal.  6 oz (170 g) plain fat-free yogurt or yogurt sweetened with artificial sweeteners.  8 oz (240 mL) milk.  8 oz (170 g) fresh fruit or one small piece of fruit.  24 oz (680 g) popped popcorn. Example of carbohydrate counting Sample meal  3 oz (85 g) chicken breast.  6 oz (170 g) brown rice.  4 oz (113 g) corn.  8 oz (240 mL) milk.  8 oz (170 g) strawberries with sugar-free whipped topping. Carbohydrate calculation 1. Identify the foods that contain carbohydrates: ? Rice. ? Corn. ? Milk. ? Strawberries. 2. Calculate how many servings you have of each food: ? 2 servings rice. ? 1 serving corn. ? 1 serving milk. ? 1 serving strawberries. 3. Multiply each number of servings by 15 g: ? 2 servings rice x 15 g = 30 g. ? 1 serving corn x 15 g = 15 g. ? 1 serving milk x 15 g = 15 g. ? 1 serving strawberries x 15 g = 15 g. 4. Add together all of the amounts to find the total grams of carbohydrates eaten: ? 30 g + 15 g + 15 g + 15 g = 75 g of carbohydrates total. Summary  Carbohydrate counting is a method of keeping track of how many carbohydrates you eat.  Eating carbohydrates naturally increases the amount of sugar (glucose) in the blood.  Counting how many carbohydrates you eat helps keep your blood glucose within normal limits, which helps you manage your diabetes.  A diet and nutrition specialist (registered dietitian) can help you make a meal plan and calculate how many carbohydrates you should have at each meal and snack. This information is not intended to replace advice given to you by your health care provider. Make sure you discuss any questions you have with your health care  provider. Document Revised: 10/25/2016 Document Reviewed: 09/14/2015 Elsevier Patient Education  2020 [employer]  Plantar Fasciitis  Plantar fasciitis is a painful foot condition that affects the heel. It occurs when the band of tissue that connects the toes to the heel bone (plantar fascia) becomes irritated. This can happen as the result of exercising too much or doing other repetitive activities (overuse injury). The pain from plantar fasciitis can range from mild irritation to severe pain that makes it difficult to walk or move. The pain is usually worse in the morning after sleeping, or after sitting or lying down for a while. Pain may also be worse after long periods of walking or standing. What are the causes? This condition may be caused by:  Standing for long periods of time.  Wearing shoes that do not have good arch support.  Doing activities that put stress on joints (high-impact  activities), including running, aerobics, and ballet.  Being overweight.  An abnormal way of walking (gait).  Tight muscles in the back of your lower leg (calf).  High arches in your feet.  Starting a new athletic activity. What are the signs or symptoms? The main symptom of this condition is heel pain. Pain may:  Be worse with first steps after a time of rest, especially in the morning after sleeping or after you have been sitting or lying down for a while.  Be worse after long periods of standing still.  Decrease after 30-45 minutes of activity, such as gentle walking. How is this diagnosed? This condition may be diagnosed based on your medical history and your symptoms. Your health care provider may ask questions about your activity level. Your health care provider will do a physical exam to check for:  A tender area on the bottom of your foot.  A high arch in your foot.  Pain when you move your foot.  Difficulty moving your foot. You may have imaging tests to confirm the  diagnosis, such as:  X-rays.  Ultrasound.  MRI. How is this treated? Treatment for plantar fasciitis depends on how severe your condition is. Treatment may include:  Rest, ice, applying pressure (compression), and raising the affected foot (elevation). This may be called RICE therapy. Your health care provider may recommend RICE therapy along with over-the-counter pain medicines to manage your pain.  Exercises to stretch your calves and your plantar fascia.  A splint that holds your foot in a stretched, upward position while you sleep (night splint).  Physical therapy to relieve symptoms and prevent problems in the future.  Injections of steroid medicine (cortisone) to relieve pain and inflammation.  Stimulating your plantar fascia with electrical impulses (extracorporeal shock wave therapy). This is usually the last treatment option before surgery.  Surgery, if other treatments have not worked after 12 months. Follow these instructions at home:  Managing pain, stiffness, and swelling  If directed, put ice on the painful area: ? Put ice in a plastic bag, or use a frozen bottle of water. ? Place a towel between your skin and the bag or bottle. ? Roll the bottom of your foot over the bag or bottle. ? Do this for 20 minutes, 2-3 times a day.  Wear athletic shoes that have air-sole or gel-sole cushions, or try wearing soft shoe inserts that are designed for plantar fasciitis.  Raise (elevate) your foot above the level of your heart while you are sitting or lying down. Activity  Avoid activities that cause pain. Ask your health care provider what activities are safe for you.  Do physical therapy exercises and stretches as told by your health care provider.  Try activities and forms of exercise that are easier on your joints (low-impact). Examples include swimming, water aerobics, and biking. General instructions  Take over-the-counter and prescription medicines only as told by  your health care provider.  Wear a night splint while sleeping, if told by your health care provider. Loosen the splint if your toes tingle, become numb, or turn cold and blue.  Maintain a healthy weight, or work with your health care provider to lose weight as needed.  Keep all follow-up visits as told by your health care provider. This is important. Contact a health care provider if you:  Have symptoms that do not go away after caring for yourself at home.  Have pain that gets worse.  Have pain that affects your ability to  move or do your daily activities. Summary  Plantar fasciitis is a painful foot condition that affects the heel. It occurs when the band of tissue that connects the toes to the heel bone (plantar fascia) becomes irritated.  The main symptom of this condition is heel pain that may be worse after exercising too much or standing still for a long time.  Treatment varies, but it usually starts with rest, ice, compression, and elevation (RICE therapy) and over-the-counter medicines to manage pain. This information is not intended to replace advice given to you by your health care provider. Make sure you discuss any questions you have with your health care provider. Document Revised: 03/15/2017 Document Reviewed: 01/28/2017 Elsevier Patient Education  2020 [employer].

## 2019-07-14 ENCOUNTER — Ambulatory Visit: Payer: Medicare PPO | Attending: Internal Medicine

## 2019-07-14 ENCOUNTER — Telehealth: Payer: Self-pay | Admitting: *Deleted

## 2019-07-14 DIAGNOSIS — Z23 Encounter for immunization: Secondary | ICD-10-CM

## 2019-07-14 NOTE — Progress Notes (Signed)
   Covid-[facility] Vaccination [facility]  Name:  Kristina Lee    MRN: [id number] DOB: 1945/10/07  07/14/2019  Kristina Lee was observed post Covid-19 immunization for 30 minutes based on pre-vaccination screening without incident. She was provided with Vaccine Information Sheet and instruction to access the V-Safe system.   Kristina Lee was instructed to call 911 with any severe reactions post vaccine: Marland Kitchen Difficulty breathing  . Swelling of face and throat  . A fast heartbeat  . A bad rash all over body  . Dizziness and weakness   Immunizations Administered    Name Date Dose VIS Date Route   Pfizer COVID-19 Vaccine 07/14/2019  3:27 PM 0.3 mL 03/27/2019 Intramuscular   Manufacturer: [employer], [employer]   Lot: [id number]   NDC: [address]-6

## 2019-07-14 NOTE — Telephone Encounter (Signed)
Pt is having MRI performed this Weekend Saturday and would like to have some anxiety meds called in for her.

## 2019-07-15 ENCOUNTER — Other Ambulatory Visit: Payer: Self-pay | Admitting: Specialist

## 2019-07-15 DIAGNOSIS — M79601 Pain in right arm: Secondary | ICD-10-CM | POA: Insufficient documentation

## 2019-07-15 MED ORDER — ALPRAZOLAM 0.25 MG PO TABS: ORAL_TABLET | ORAL | 0 refills | Status: DC

## 2019-07-15 NOTE — Telephone Encounter (Signed)
Pt is having MRI performed this Weekend Saturday and would like to have some anxiety meds called in for her.  

## 2019-07-15 NOTE — Progress Notes (Signed)
Subjective:    Patient ID: Kristina Lee, female    DOB: June 30, 1945, 74 y.o.   MRN: [id number]  Chief Complaint  Patient presents with  . Hypertension  . Diabetes    HPI Patient is in today for follow up on chronic medical concerns. She had a fall in January and has had pain in right arm since. It is improving but persistent. Her hives have resolved but she is struggling with dry skin. Her blood pressure has still been running high. 140s to 190s over 70s to 90s. Denies CP/palp/SOB/HA/congestion/fevers/GI or GU c/o. Taking meds as prescribed  Past Medical History:  Diagnosis Date  . Diabetes mellitus without complication ([place])   . Heart attack ([place]) 09/07/2017  . Urticaria     Past Surgical History:  Procedure Laterality Date  . ABDOMINAL HYSTERECTOMY  2006   b/l SPO and TAH, uterine cancer  . EYE SURGERY Right 1960  . TUBAL LIGATION      Family History  Problem Relation Age of Onset  . Cancer Mother        liver cancer, breast cancer x 2   . Alcohol abuse Mother        smoker  . Heart disease Father        MI  . Alcohol abuse Father        smoker  . Depression Sister   . Tremor Sister   . Cancer Brother        pancreatic  . Healthy Son   . Diabetes Maternal Grandmother   . Hearing loss Maternal Grandmother   . Depression Maternal Grandmother   . Allergic rhinitis Neg Hx   . Angioedema Neg Hx   . Asthma Neg Hx   . Eczema Neg Hx   . Immunodeficiency Neg Hx   . Urticaria Neg Hx     Social History   Socioeconomic History  . Marital status: Married    Spouse name: Not on file  . Number of children: Not on file  . Years of education: Not on file  . Highest education level: Not on file  Occupational History  . Not on file  Tobacco Use  . Smoking status: Former Smoker    Packs/day: 0.50    Years: 24.00    Pack years: 12.00    Types: Cigarettes    Quit date: 04/17/1987    Years since quitting: 32.2  . Smokeless tobacco: Never Used  Substance and  Sexual Activity  . Alcohol use: Yes    Comment: occ  . Drug use: Not Currently  . Sexual activity: Yes  Other Topics Concern  . Not on file  Social History Narrative  . Not on file   Social Determinants of Health   Financial Resource Strain:   . Difficulty of Paying Living Expenses:   Food Insecurity:   . Worried About Charity fundraiser in the Last Year:   . Arboriculturist in the Last Year:   Transportation Needs:   . Film/video editor (Medical):   Marland Kitchen Lack of Transportation (Non-Medical):   Physical Activity:   . Days of Exercise per Week:   . Minutes of Exercise per Session:   Stress:   . Feeling of Stress :   Social Connections:   . Frequency of Communication with Friends and Family:   . Frequency of Social Gatherings with Friends and Family:   . Attends Religious Services:   . Active Member of Clubs or Organizations:   .  Attends Archivist Meetings:   Marland Kitchen Marital Status:   Intimate Partner Violence:   . Fear of Current or Ex-Partner:   . Emotionally Abused:   Marland Kitchen Physically Abused:   . Sexually Abused:     Outpatient Medications Prior to Visit  Medication Sig Dispense Refill  . Accu-Chek Softclix Lancets lancets Use as instructed 100 each 12  . Acetaminophen (TYLENOL PO) Take by mouth as needed.    Marland Kitchen aspirin EC 81 MG tablet Take 81 mg by mouth daily.    Marland Kitchen atorvastatin (LIPITOR) 80 MG tablet TAKE 1 TABLET BY MOUTH EVERY DAY 90 tablet 1  . augmented betamethasone dipropionate (DIPROLENE-AF) 0.05 % cream Apply topically daily.    . blood glucose meter kit and supplies KIT Dispense based on patient and insurance preference. Check blood sugars tid prn Dx; E11.9 1 each 0  . Calcium Carbonate-Vit D-Min (CALCIUM 600+D PLUS MINERALS) 600-400 MG-UNIT TABS Take 2 tablets by mouth daily.     . clopidogrel (PLAVIX) 75 MG tablet Take 1 tablet (75 mg total) by mouth daily. 90 tablet 3  . diclofenac sodium (VOLTAREN) 1 % GEL APPLY 4 G TOPICALLY 4 (FOUR) TIMES DAILY AS  NEEDED FOR PAIN 500 g 1  . famotidine (PEPCID) 20 MG tablet Take 1 tablet (20 mg total) by mouth 2 (two) times daily as needed for heartburn or indigestion. 180 tablet 1  . fexofenadine (ALLEGRA) 180 MG tablet Take 1 tablet (180 mg total) by mouth 2 (two) times daily. (Patient taking differently: Take 180 mg by mouth as needed. ) 180 tablet 1  . fluticasone (FLONASE) 50 MCG/ACT nasal spray Place 2 sprays into both nostrils daily. (Patient taking differently: Place 2 sprays into both nostrils as needed. ) 48 g 1  . gabapentin (NEURONTIN) 100 MG capsule Take 1 capsule (100 mg total) by mouth at bedtime. 30 capsule 1  . glucose blood (COOL BLOOD GLUCOSE TEST STRIPS) test strip Use as instructedDispense based on patient and insurance preference. Check blood sugars tid prn Dx; E11.9 100 each 12  . Lancets MISC Dispense based on patient and insurance preference. Check blood sugars tid prn Dx; E11.9 100 each 3  . OVER THE COUNTER MEDICATION as needed.    . polyethylene glycol (MIRALAX / GLYCOLAX) 17 g packet Take 17 g by mouth as needed.    . traMADol-acetaminophen (ULTRACET) 37.5-325 MG tablet Take 1 tablet by mouth every 6 (six) hours as needed. 30 tablet 0  . lisinopril (ZESTRIL) 2.5 MG tablet TAKE 1 TABLET BY MOUTH EVERY DAY 90 tablet 1  . augmented betamethasone dipropionate (DIPROLENE-AF) 0.05 % cream Apply 1 application topically 2 (two) times daily. 30 g 0  . dexamethasone (DECADRON) 0.75 MG tablet SMARTSIG:3 Tablet(s) By Mouth Every Other Day PRN    . minoxidil (ROGAINE) 2 % external solution Apply topically 2 (two) times daily. 60 mL 0  . neomycin-colistin-hydrocortisone-thonzonium (CORTISPORIN-TC) 3.06-16-08-0.5 MG/ML OTIC suspension Place 3 drops into the right ear 3 (three) times daily. 10 mL 0  . tacrolimus (PROTOPIC) 0.1 % ointment Apply 1 application topically 2 (two) times daily.     No facility-administered medications prior to visit.    Allergies  Allergen Reactions  . Amoxicillin  Anaphylaxis  . Hydroxyzine Anaphylaxis  . Sulfa Antibiotics Swelling    Other reaction(s): Swelling tongue tongue tongue     Review of Systems  Constitutional: Negative for fever and malaise/fatigue.  HENT: Negative for congestion.   Eyes: Negative for blurred vision.  Respiratory:  Negative for shortness of breath.   Cardiovascular: Negative for chest pain, palpitations and leg swelling.  Gastrointestinal: Negative for abdominal pain, blood in stool and nausea.  Genitourinary: Negative for dysuria and frequency.  Musculoskeletal: Positive for joint pain. Negative for falls.  Skin: Negative for rash.  Neurological: Negative for dizziness, loss of consciousness and headaches.  Endo/Heme/Allergies: Negative for environmental allergies.  Psychiatric/Behavioral: Negative for depression. The patient is not nervous/anxious.        Objective:    Physical Exam Vitals and nursing note reviewed.  Constitutional:      General: She is not in acute distress.    Appearance: She is well-developed.  HENT:     Head: Normocephalic and atraumatic.     Nose: Nose normal.  Eyes:     General:        Right eye: No discharge.        Left eye: No discharge.  Cardiovascular:     Rate and Rhythm: Normal rate and regular rhythm.     Heart sounds: No murmur.  Pulmonary:     Effort: Pulmonary effort is normal.     Breath sounds: Normal breath sounds.  Abdominal:     General: Bowel sounds are normal.     Palpations: Abdomen is soft.     Tenderness: There is no abdominal tenderness.  Musculoskeletal:     Cervical back: Normal range of motion and neck supple.  Skin:    General: Skin is warm and dry.  Neurological:     Mental Status: She is alert and oriented to person, place, and time.     BP (!) 153/66 (BP Location: Left Arm, Cuff Size: Normal)   Pulse (!) 57   Temp 98.2 F (36.8 C) (Oral)   Resp 12   Ht 5' 2"  (1.575 m)   Wt 136 lb (61.7 kg)   SpO2 99%   BMI 24.87 kg/m  Wt  Readings from Last 3 Encounters:  07/13/19 136 lb (61.7 kg)  07/09/19 131 lb (59.4 kg)  06/11/19 131 lb (59.4 kg)    Diabetic Foot Exam - Simple   No data filed     Lab Results  Component Value Date   WBC 5.6 04/04/2018   HGB 14.3 04/04/2018   HCT 42.1 04/04/2018   PLT 212.0 04/04/2018   GLUCOSE 109 (H) 05/18/2019   CHOL 100 04/04/2018   TRIG 72.0 04/04/2018   HDL 39.80 04/04/2018   LDLCALC 46 04/04/2018   ALT 19 05/18/2019   AST 29 05/18/2019   NA 141 05/18/2019   K 3.9 05/18/2019   CL 104 05/18/2019   CREATININE 0.94 05/18/2019   BUN 20 05/18/2019   CO2 29 05/18/2019   TSH 0.83 04/04/2018   HGBA1C 6.0 05/18/2019    Lab Results  Component Value Date   TSH 0.83 04/04/2018   Lab Results  Component Value Date   WBC 5.6 04/04/2018   HGB 14.3 04/04/2018   HCT 42.1 04/04/2018   MCV 86.2 04/04/2018   PLT 212.0 04/04/2018   Lab Results  Component Value Date   NA 141 05/18/2019   K 3.9 05/18/2019   CO2 29 05/18/2019   GLUCOSE 109 (H) 05/18/2019   BUN 20 05/18/2019   CREATININE 0.94 05/18/2019   BILITOT 1.4 (H) 05/18/2019   ALKPHOS 58 05/18/2019   AST 29 05/18/2019   ALT 19 05/18/2019   PROT 7.3 05/18/2019   ALBUMIN 4.4 05/18/2019   CALCIUM 9.8 05/18/2019   ANIONGAP 11 03/12/2018  GFR 58.26 (L) 05/18/2019   Lab Results  Component Value Date   CHOL 100 04/04/2018   Lab Results  Component Value Date   HDL 39.80 04/04/2018   Lab Results  Component Value Date   LDLCALC 46 04/04/2018   Lab Results  Component Value Date   TRIG 72.0 04/04/2018   Lab Results  Component Value Date   CHOLHDL 3 04/04/2018   Lab Results  Component Value Date   HGBA1C 6.0 05/18/2019       Assessment & Plan:   Problem List Items Addressed This Visit    Chronic kidney disease, stage III (moderate)    Hydrate and monitor      Essential hypertension    Increase Lisinopril 5 mg po bid      Relevant Medications   lisinopril (ZESTRIL) 5 MG tablet   Other  Relevant Orders   CBC   Comprehensive metabolic panel   TSH   Hyperlipidemia associated with type 2 diabetes mellitus ([place]) - Primary   Relevant Medications   lisinopril (ZESTRIL) 5 MG tablet   Other Relevant Orders   Lipid panel   Diabetes mellitus ([place])    hgba1c acceptable, minimize simple carbs. Increase exercise as tolerated.       Relevant Medications   lisinopril (ZESTRIL) 5 MG tablet   Right arm pain    She had a pain after a fall in January. Encouraged moist heat and gentle stretching as tolerated. May try NSAIDs and prescription meds as directed and report if symptoms worsen or seek immediate care. Try topical lidocaine. Report if ready for a referral         I have discontinued Dangela A. Skelly's minoxidil, neomycin-colistin-hydrocortisone-thonzonium, tacrolimus, dexamethasone, and lisinopril. I am also having her start on lisinopril. Additionally, I am having her maintain her aspirin EC, Calcium 600+D Plus Minerals, diclofenac sodium, atorvastatin, fexofenadine, fluticasone, famotidine, blood glucose meter kit and supplies, Cool Blood Glucose Test Strips, Lancets, clopidogrel, Accu-Chek Softclix Lancets, Acetaminophen (TYLENOL PO), OVER THE COUNTER MEDICATION, polyethylene glycol, gabapentin, traMADol-acetaminophen, and augmented betamethasone dipropionate.  Meds ordered this encounter  Medications  . lisinopril (ZESTRIL) 5 MG tablet    Sig: Take 1 tablet (5 mg total) by mouth 2 (two) times daily.    Dispense:  180 tablet    Refill:  1     Penni Homans, MD

## 2019-07-15 NOTE — Assessment & Plan Note (Signed)
Hydrate and monitor 

## 2019-07-15 NOTE — Assessment & Plan Note (Signed)
hgba1c acceptable, minimize simple carbs. Increase exercise as tolerated.  

## 2019-07-15 NOTE — Telephone Encounter (Signed)
I called and advised that her meds were sent to her pharmacy

## 2019-07-15 NOTE — Telephone Encounter (Signed)
Rx for xanax sent to her pharmacy with instructions for its use.jen

## 2019-07-15 NOTE — Assessment & Plan Note (Signed)
She had a pain after a fall in January. Encouraged moist heat and gentle stretching as tolerated. May try NSAIDs and prescription meds as directed and report if symptoms worsen or seek immediate care. Try topical lidocaine. Report if ready for a referral

## 2019-07-15 NOTE — Assessment & Plan Note (Signed)
Increase Lisinopril 5 mg po bid

## 2019-07-16 ENCOUNTER — Ambulatory Visit: Payer: Medicare PPO | Admitting: Specialist

## 2019-07-18 ENCOUNTER — Ambulatory Visit (HOSPITAL_BASED_OUTPATIENT_CLINIC_OR_DEPARTMENT_OTHER)
Admission: RE | Admit: 2019-07-18 | Discharge: 2019-07-18 | Disposition: A | Discharge status: Home / Self Care | Payer: Medicare PPO | Source: Ambulatory Visit | Attending: Specialist | Admitting: Specialist

## 2019-07-18 ENCOUNTER — Other Ambulatory Visit: Payer: Self-pay

## 2019-07-18 DIAGNOSIS — M4722 Other spondylosis with radiculopathy, cervical region: Secondary | ICD-10-CM

## 2019-07-18 DIAGNOSIS — M79601 Pain in right arm: Secondary | ICD-10-CM | POA: Diagnosis not present

## 2019-07-18 DIAGNOSIS — S56511A Strain of other extensor muscle, fascia and tendon at forearm level, right arm, initial encounter: Secondary | ICD-10-CM | POA: Diagnosis not present

## 2019-07-18 DIAGNOSIS — S53401A Unspecified sprain of right elbow, initial encounter: Secondary | ICD-10-CM | POA: Diagnosis not present

## 2019-07-21 ENCOUNTER — Other Ambulatory Visit: Payer: Self-pay | Admitting: Family Medicine

## 2019-07-21 MED ORDER — LISINOPRIL 5 MG PO TABS: 5.0000 mg | ORAL_TABLET | Freq: Two times a day (BID) | ORAL | 1 refills | Status: DC

## 2019-07-23 ENCOUNTER — Ambulatory Visit: Payer: Medicare PPO | Admitting: Specialist

## 2019-07-23 ENCOUNTER — Encounter: Payer: Self-pay | Admitting: Specialist

## 2019-07-23 ENCOUNTER — Other Ambulatory Visit: Payer: Self-pay

## 2019-07-23 ENCOUNTER — Telehealth: Payer: Self-pay

## 2019-07-23 VITALS — BP 159/77 | HR 61 | Ht 61.5 in | Wt 131.0 lb

## 2019-07-23 DIAGNOSIS — R202 Paresthesia of skin: Secondary | ICD-10-CM | POA: Diagnosis not present

## 2019-07-23 DIAGNOSIS — M7711 Lateral epicondylitis, right elbow: Secondary | ICD-10-CM

## 2019-07-23 DIAGNOSIS — R200 Anesthesia of skin: Secondary | ICD-10-CM

## 2019-07-23 DIAGNOSIS — G8929 Other chronic pain: Secondary | ICD-10-CM

## 2019-07-23 DIAGNOSIS — M25512 Pain in left shoulder: Secondary | ICD-10-CM

## 2019-07-23 DIAGNOSIS — M25511 Pain in right shoulder: Secondary | ICD-10-CM | POA: Diagnosis not present

## 2019-07-23 DIAGNOSIS — M542 Cervicalgia: Secondary | ICD-10-CM | POA: Diagnosis not present

## 2019-07-23 DIAGNOSIS — S53401A Unspecified sprain of right elbow, initial encounter: Secondary | ICD-10-CM

## 2019-07-23 MED ORDER — METHYLPREDNISOLONE 4 MG PO TABS: ORAL_TABLET | ORAL | 0 refills | Status: DC

## 2019-07-23 NOTE — Telephone Encounter (Signed)
Pt has just been prescribed methylPREDNISolone.  Pt is wondering should she take this med?   Pt's seasonal allergies have started and she wants to know if she can take Allegra or Claritin with this medication?   Pt's BP today was 159/77 when she saw Dr. Otelia Sergeant.

## 2019-07-23 NOTE — Progress Notes (Signed)
Office Visit Note   Patient: Kristina Lee           Date of Birth: 11/11/1945           MRN: [id number] Visit Date: 07/23/2019              Requested by: Mosie Lukes, MD [address] [place],  [place] [address] PCP: Mosie Lukes, MD   Assessment & Plan: Visit Diagnoses:  1. Chronic pain of both shoulders   2. Elbow sprain, right, initial encounter   3. Lateral epicondylitis, right elbow   4. Cervicalgia   5. Numbness and tingling in both hands     Plan: Avoid overhead lifting and overhead use of the arms. Do not lift greater than 5 lbs. Adjust head rest in vehicle to prevent hyperextension if rear ended. Take extra precautions to avoid falling. Physical therapy for right elbow and bilateral shoulder concerns of tendonitis and bursitis. Medrol low dose 4mg  a day for 2 weeks then 1/2 tablet a day for 2 weeks. EMG/NCV of the arms to assess for local nerve changes.  Follow-Up Instructions: Return in about 3 weeks (around 08/13/2019).   Orders:  Orders Placed This Encounter  Procedures  . Ambulatory referral to Physical Therapy  . Ambulatory referral to Physical Medicine Rehab   Meds ordered this encounter  Medications  . methylPREDNISolone (MEDROL) 4 MG tablet    Sig: Take 1 tablet (4 mg total) by mouth daily for 14 days, THEN 0.5 tablets (2 mg total) daily for 14 days.    Dispense:  21 tablet    Refill:  0      Procedures: No procedures performed   Clinical Data: Findings:  CLINICAL DATA:  Radial sided elbow pain.  EXAM: MRI OF THE RIGHT ELBOW WITHOUT CONTRAST  TECHNIQUE: Multiplanar, multisequence MR imaging of the elbow was performed. No intravenous contrast was administered.  COMPARISON:  05/21/2019  FINDINGS: TENDONS  Common forearm flexor origin: Intact. No tendinopathy or tear.  Common forearm extensor origin: Tendinopathy with partial-thickness tearing of the deep attachment fibers.  Biceps: Intact. The  brachialis tendon is intact.  Triceps: Intact.  LIGAMENTS  Medial stabilizers: Intact  Lateral stabilizers:  Intact  Cartilage: Mild degenerative chondrosis but no cartilage defects or osteochondral lesion.  Joint: Mild degenerative changes but no joint effusion or loose bodies.  Cubital tunnel: Normal appearance of the ulnar nerve.  Bones: No acute bony findings.  IMPRESSION: 1. Tendinopathy with partial-thickness tearing of the deep attachment fibers of the common extensor tendon. 2. Intact radial and ulnar collateral ligaments. 3. Mild elbow joint degenerative changes but no joint effusion or loose bodies.   Electronically Signed   By: Marijo Sanes M.D.   On: 07/18/2019 15:17     Subjective: Chief Complaint  Patient presents with  . Right Elbow - Follow-up    MRI review    74 year old female with history of fall with right elbow pain, she has had stiffness and pain into the right lateral forearm. She is having pain in the left shoulder with going to sit in a chair. Over the last 2 days she started having pain in the bilateral shoulders. Having pain into the 2 fingers of the right hand. She is concerned about the allergy season.   Review of Systems  Constitutional: Negative.  Negative for activity change, appetite change, chills, diaphoresis, fatigue, fever and unexpected weight change.  HENT: Positive for congestion, postnasal drip, rhinorrhea,  sinus pressure, sinus pain and sore throat. Negative for dental problem, drooling, ear discharge, ear pain, facial swelling, hearing loss, mouth sores, nosebleeds, sneezing, tinnitus, trouble swallowing and voice change.   Eyes: Positive for redness and itching. Negative for photophobia, pain, discharge and visual disturbance.  Respiratory: Negative for apnea, cough, choking, chest tightness, shortness of breath, wheezing and stridor.   Cardiovascular: Negative.  Negative for chest pain, palpitations and leg  swelling.  Gastrointestinal: Negative.  Negative for abdominal distention, abdominal pain, anal bleeding, blood in stool, constipation, diarrhea, nausea, rectal pain and vomiting.  Endocrine: Negative.  Negative for cold intolerance, heat intolerance, polydipsia, polyphagia and polyuria.  Genitourinary: Negative.  Negative for difficulty urinating, dyspareunia, dysuria, enuresis, flank pain, frequency, hematuria, pelvic pain and urgency.  Musculoskeletal: Positive for neck pain and neck stiffness. Negative for arthralgias, back pain, gait problem, joint swelling and myalgias.  Skin: Negative.  Negative for color change, pallor and rash.  Allergic/Immunologic: Negative.  Negative for environmental allergies, food allergies and immunocompromised state.  Neurological: Positive for numbness. Negative for dizziness, tremors, seizures, syncope, facial asymmetry, speech difficulty, weakness, light-headedness and headaches.  Hematological: Negative.  Negative for adenopathy. Does not bruise/bleed easily.  Psychiatric/Behavioral: Negative for agitation, behavioral problems, confusion, decreased concentration, dysphoric mood, hallucinations, self-injury, sleep disturbance and suicidal ideas. The patient is not nervous/anxious and is not hyperactive.      Objective: Vital Signs: BP (!) 159/77 (BP Location: Left Arm, Patient Position: Sitting)   Pulse 61   Ht 5' 1.5" (1.562 m)   Wt 131 lb (59.4 kg)   BMI 24.35 kg/m   Physical Exam Constitutional:      Appearance: She is well-developed.  HENT:     Head: Normocephalic and atraumatic.  Eyes:     Pupils: Pupils are equal, round, and reactive to light.  Pulmonary:     Effort: Pulmonary effort is normal.     Breath sounds: Normal breath sounds.  Abdominal:     General: Bowel sounds are normal.     Palpations: Abdomen is soft.  Musculoskeletal:     Cervical back: Normal range of motion and neck supple.  Skin:    General: Skin is warm and dry.    Neurological:     Mental Status: She is alert and oriented to person, place, and time.  Psychiatric:        Behavior: Behavior normal.        Thought Content: Thought content normal.        Judgment: Judgment normal.     Back Exam   Tenderness  The patient is experiencing tenderness in the cervical and lumbar.  Range of Motion  Extension:  80 abnormal  Flexion:  80 abnormal  Lateral bend right: abnormal  Lateral bend left: abnormal   Muscle Strength  Right Quadriceps:  5/5  Left Quadriceps:  5/5  Right Hamstrings:  5/5   Reflexes  Patellar: 2/4 Achilles: 2/4 Biceps: 2/4 Babinski's sign: normal   Other  Toe walk: normal Heel walk: normal Sensation: normal      Specialty Comments:  No specialty comments available.  Imaging: No results found.   PMFS History: Patient Active Problem List   Diagnosis Date Noted  . Right arm pain 07/15/2019  . Hair loss 08/10/2018  . Hearing loss 08/10/2018  . Otitis externa 08/10/2018  . Varicose veins of right lower extremity with complications 05/08/2018  . Vascular dementia with behavior disturbance ([facility]) 02/24/2018  . Myalgia 02/09/2018  . Seasonal allergic rhinitis due  to pollen 01/21/2018  . Cough 12/23/2017  . Allergic urticaria 12/23/2017  . History of myocardial infarction in last year 12/23/2017  . Diabetes mellitus ([facility]) 12/23/2017  . Rash and nonspecific skin eruption 12/23/2017  . Intrinsic atopic dermatitis 12/23/2017  . CAD (coronary artery disease) 09/25/2017  . Low back pain 09/25/2017  . Gastroesophageal reflux disease with esophagitis 09/04/2017  . Hyperlipidemia associated with type 2 diabetes mellitus ([facility]) 12/31/2016  . Hypokalemia 10/28/2015  . DDD (degenerative disc disease), lumbosacral 09/13/2015  . Diverticulosis of large intestine 09/13/2015  . Seborrheic dermatitis of scalp 09/13/2015  . Chronic kidney disease, stage III (moderate) 05/02/2015  . Allergic rhinitis 07/14/2014  .  Essential hypertension 07/14/2014  . History of uterine cancer 07/14/2014   Past Medical History:  Diagnosis Date  . Diabetes mellitus without complication ([facility])   . Heart attack ([facility]) 09/07/2017  . Urticaria     Family History  Problem Relation Age of Onset  . Cancer Mother        liver cancer, breast cancer x 2   . Alcohol abuse Mother        smoker  . Heart disease Father        MI  . Alcohol abuse Father        smoker  . Depression Sister   . Tremor Sister   . Cancer Brother        pancreatic  . Healthy Son   . Diabetes Maternal Grandmother   . Hearing loss Maternal Grandmother   . Depression Maternal Grandmother   . Allergic rhinitis Neg Hx   . Angioedema Neg Hx   . Asthma Neg Hx   . Eczema Neg Hx   . Immunodeficiency Neg Hx   . Urticaria Neg Hx     Past Surgical History:  Procedure Laterality Date  . ABDOMINAL HYSTERECTOMY  2006   b/l SPO and TAH, uterine cancer  . EYE SURGERY Right 1960  . TUBAL LIGATION     Social History   Occupational History  . Not on file  Tobacco Use  . Smoking status: Former Smoker    Packs/day: 0.50    Years: 24.00    Pack years: 12.00    Types: Cigarettes    Quit date: 04/17/1987    Years since quitting: 32.2  . Smokeless tobacco: Never Used  Substance and Sexual Activity  . Alcohol use: Yes    Comment: occ  . Drug use: Not Currently  . Sexual activity: Yes

## 2019-07-23 NOTE — Patient Instructions (Addendum)
Avoid overhead lifting and overhead use of the arms. Do not lift greater than 5 lbs. Adjust head rest in vehicle to prevent hyperextension if rear ended. Take extra precautions to avoid falling. Physical therapy for right elbow and bilateral shoulder concerns of tendonitis and bursitis. Medrol low dose 4mg  a day for 2 weeks then 1/2 tablet a day for 2 weeks. EMG/NCV of the arms to assess for local nerve changes.

## 2019-07-24 NOTE — Telephone Encounter (Signed)
I would prefer her systolic BP be below 145 before she takes prednisone. She can take the Loratadine twice a day and then if she still needs to she can take the steroid

## 2019-07-24 NOTE — Telephone Encounter (Signed)
Patient notified. She will try and get a bp machine so she can check blood pressure.  Advised her that her bp could be lower while at home so to get one as soon as she can to hold back from taking the medication that she she needs.

## 2019-07-29 ENCOUNTER — Ambulatory Visit: Payer: Medicare PPO | Admitting: Rheumatology

## 2019-08-03 ENCOUNTER — Encounter: Payer: Self-pay | Admitting: Physical Therapy

## 2019-08-03 ENCOUNTER — Other Ambulatory Visit: Payer: Self-pay

## 2019-08-03 ENCOUNTER — Ambulatory Visit: Payer: Medicare PPO | Attending: Specialist | Admitting: Physical Therapy

## 2019-08-03 DIAGNOSIS — M25511 Pain in right shoulder: Secondary | ICD-10-CM | POA: Diagnosis not present

## 2019-08-03 DIAGNOSIS — M25611 Stiffness of right shoulder, not elsewhere classified: Secondary | ICD-10-CM | POA: Insufficient documentation

## 2019-08-03 DIAGNOSIS — M25612 Stiffness of left shoulder, not elsewhere classified: Secondary | ICD-10-CM | POA: Insufficient documentation

## 2019-08-03 DIAGNOSIS — R293 Abnormal posture: Secondary | ICD-10-CM | POA: Insufficient documentation

## 2019-08-03 DIAGNOSIS — M25512 Pain in left shoulder: Secondary | ICD-10-CM | POA: Insufficient documentation

## 2019-08-03 DIAGNOSIS — R42 Dizziness and giddiness: Secondary | ICD-10-CM | POA: Insufficient documentation

## 2019-08-03 DIAGNOSIS — M25521 Pain in right elbow: Secondary | ICD-10-CM | POA: Insufficient documentation

## 2019-08-03 DIAGNOSIS — G8929 Other chronic pain: Secondary | ICD-10-CM | POA: Insufficient documentation
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
## 2019-08-04 DIAGNOSIS — L820 Inflamed seborrheic keratosis: Secondary | ICD-10-CM | POA: Diagnosis not present

## 2019-08-04 DIAGNOSIS — I781 Nevus, non-neoplastic: Secondary | ICD-10-CM | POA: Diagnosis not present

## 2019-08-04 DIAGNOSIS — D485 Neoplasm of uncertain behavior of skin: Secondary | ICD-10-CM | POA: Diagnosis not present

## 2019-08-04 DIAGNOSIS — L738 Other specified follicular disorders: Secondary | ICD-10-CM | POA: Diagnosis not present

## 2019-08-04 DIAGNOSIS — L218 Other seborrheic dermatitis: Secondary | ICD-10-CM | POA: Diagnosis not present

## 2019-08-04 DIAGNOSIS — L2089 Other atopic dermatitis: Secondary | ICD-10-CM | POA: Diagnosis not present

## 2019-08-07 ENCOUNTER — Ambulatory Visit (INDEPENDENT_AMBULATORY_CARE_PROVIDER_SITE_OTHER): Payer: Medicare PPO | Admitting: Physical Medicine and Rehabilitation

## 2019-08-07 ENCOUNTER — Encounter: Payer: Self-pay | Admitting: Physical Medicine and Rehabilitation

## 2019-08-07 ENCOUNTER — Other Ambulatory Visit: Payer: Self-pay

## 2019-08-07 DIAGNOSIS — R202 Paresthesia of skin: Secondary | ICD-10-CM

## 2019-08-07 NOTE — Progress Notes (Signed)
.  Numeric Pain Rating Scale and Functional Assessment Average Pain 10   In the last MONTH (on 0-10 scale) has pain interfered with the following?  1. General activity like being  able to carry out your everyday physical activities such as walking, climbing stairs, carrying groceries, or moving a chair?  Rating(10)     

## 2019-08-10 ENCOUNTER — Encounter: Payer: Self-pay | Admitting: Physical Therapy

## 2019-08-10 ENCOUNTER — Other Ambulatory Visit: Payer: Self-pay

## 2019-08-10 ENCOUNTER — Ambulatory Visit: Payer: Medicare PPO | Admitting: Physical Therapy

## 2019-08-10 DIAGNOSIS — R293 Abnormal posture: Secondary | ICD-10-CM

## 2019-08-10 DIAGNOSIS — M25521 Pain in right elbow: Secondary | ICD-10-CM

## 2019-08-10 DIAGNOSIS — M25612 Stiffness of left shoulder, not elsewhere classified: Secondary | ICD-10-CM | POA: Diagnosis not present

## 2019-08-10 DIAGNOSIS — G8929 Other chronic pain: Secondary | ICD-10-CM

## 2019-08-10 DIAGNOSIS — M25611 Stiffness of right shoulder, not elsewhere classified: Secondary | ICD-10-CM | POA: Diagnosis not present

## 2019-08-10 DIAGNOSIS — M25512 Pain in left shoulder: Secondary | ICD-10-CM

## 2019-08-10 DIAGNOSIS — M25511 Pain in right shoulder: Secondary | ICD-10-CM | POA: Diagnosis not present

## 2019-08-10 DIAGNOSIS — R42 Dizziness and giddiness: Secondary | ICD-10-CM | POA: Diagnosis not present

## 2019-08-10 NOTE — Procedures (Signed)
EMG & NCV Findings: Evaluation of the right median motor nerve showed decreased conduction velocity (Elbow-Wrist, 46 m/s).  The left median (across palm) sensory and the right median (across palm) sensory nerves showed prolonged distal peak latency (Wrist, L4.6, R4.6 ms) and prolonged distal peak latency (Palm, L2.6, R2.6 ms).  The left ulnar sensory and the right ulnar sensory nerves showed prolonged distal peak latency (L4.2, R4.0 ms) and decreased conduction velocity (Wrist-5th Digit, L33, R35 m/s).  All remaining nerves (as indicated in the following tables) were within normal limits.  All left vs. right side differences were within normal limits.    All examined muscles (as indicated in the following table) showed no evidence of electrical instability.    Impression: The above electrodiagnostic study is ABNORMAL and reveals evidence of:  1.  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   2.  A mild left median nerve entrapment at the wrist (carpal tunnel syndrome) affecting sensory components.   *There is likely some component of temperature artifact.  There is no significant electrodiagnostic evidence of any other focal nerve entrapment, brachial plexopathy or cervical radiculopathy.     Recommendations: 1.  Follow-up with referring physician. 2.  Continue current management of symptoms. 3.  Continue use of resting splint at night-time and as needed during the day.  ___________________________ Wonda Olds Board Certified, American Board of Physical Medicine and Rehabilitation    Nerve Conduction Studies Anti Sensory Summary Table   Stim Site NR Peak (ms) Norm Peak (ms) P-T Amp (V) Norm P-T Amp Site1 Site2 Delta-P (ms) Dist (cm) Vel (m/s) Norm Vel (m/s)  Left Median Acr Palm Anti Sensory (2nd Digit)  29.1C  Wrist    *4.6 <3.6 25.6 >10 Wrist Palm 2.0 0.0    Palm    *2.6 <2.0 17.9           Right Median Acr Palm Anti Sensory (2nd Digit)  28.8C  Wrist    *4.6 <3.6 29.8 >10 Wrist Palm 2.0 0.0    Palm    *2.6 <2.0 32.8         Left Radial Anti Sensory (Base 1st Digit)  28.8C  Wrist    2.3 <3.1 14.7  Wrist Base 1st Digit 2.3 0.0    Right Radial Anti Sensory (Base 1st Digit)  28.4C  Wrist    2.4 <3.1 20.9  Wrist Base 1st Digit 2.4 0.0    Left Ulnar Anti Sensory (5th Digit)  29.2C  Wrist    *4.2 <3.7 17.6 >15.0 Wrist 5th Digit 4.2 14.0 *33 >38  Right Ulnar Anti Sensory (5th Digit)  28.7C  Wrist    *4.0 <3.7 21.6 >15.0 Wrist 5th Digit 4.0 14.0 *35 >38   Motor Summary Table   Stim Site NR Onset (ms) Norm Onset (ms) O-P Amp (mV) Norm O-P Amp Site1 Site2 Delta-0 (ms) Dist (cm) Vel (m/s) Norm Vel (m/s)  Left Median Motor (Abd Poll Brev)  28.9C  Wrist    3.8 <4.2 9.5 >5 Elbow Wrist 3.5 18.0 51 >50  Elbow    7.3  4.7         Right Median Motor (Abd Poll Brev)  28.3C  Wrist    3.8 <4.2 9.9 >5 Elbow Wrist 3.9 18.0 *46 >50  Elbow    7.7  4.1         Left Ulnar Motor (Abd Dig Min)  29C  Wrist  3.5 <4.2 6.9 >3 B Elbow Wrist 3.1 17.5 56 >53  B Elbow    6.6  7.4  A Elbow B Elbow 1.9 10.0 53 >53  A Elbow    8.5  6.7         Right Ulnar Motor (Abd Dig Min)  28.3C  Wrist    3.4 <4.2 9.1 >3 B Elbow Wrist 3.2 17.0 53 >53  B Elbow    6.6  8.6  A Elbow B Elbow 1.4 9.0 64 >53  A Elbow    8.0  5.7          EMG   Side Muscle Nerve Root Ins Act Fibs Psw Amp Dur Poly Recrt Int Dennie Bible Comment  Right Abd Poll Brev Median C8-T1 Nml Nml Nml Nml Nml 0 Nml Nml   Right 1stDorInt Ulnar C8-T1 Nml Nml Nml Nml Nml 0 Nml Nml   Right PronatorTeres Median C6-7 Nml Nml Nml Nml Nml 0 Nml Nml   Right Biceps Musculocut C5-6 Nml Nml Nml Nml Nml 0 Nml Nml   Right Deltoid Axillary C5-6 Nml Nml Nml Nml Nml 0 Nml Nml     Nerve Conduction Studies Anti Sensory Left/Right Comparison   Stim Site L Lat (ms) R Lat (ms) L-R Lat (ms) L Amp (V) R Amp (V) L-R Amp (%) Site1 Site2 L Vel (m/s) R Vel (m/s) L-R Vel  (m/s)  Median Acr Palm Anti Sensory (2nd Digit)  29.1C  Wrist *4.6 *4.6 0.0 25.6 29.8 14.1 Wrist Palm     Palm *2.6 *2.6 0.0 17.9 32.8 45.4       Radial Anti Sensory (Base 1st Digit)  28.8C  Wrist 2.3 2.4 0.1 14.7 20.9 29.7 Wrist Base 1st Digit     Ulnar Anti Sensory (5th Digit)  29.2C  Wrist *4.2 *4.0 0.2 17.6 21.6 18.5 Wrist 5th Digit *33 *35 2   Motor Left/Right Comparison   Stim Site L Lat (ms) R Lat (ms) L-R Lat (ms) L Amp (mV) R Amp (mV) L-R Amp (%) Site1 Site2 L Vel (m/s) R Vel (m/s) L-R Vel (m/s)  Median Motor (Abd Poll Brev)  28.9C  Wrist 3.8 3.8 0.0 9.5 9.9 4.0 Elbow Wrist 51 *46 5  Elbow 7.3 7.7 0.4 4.7 4.1 12.8       Ulnar Motor (Abd Dig Min)  29C  Wrist 3.5 3.4 0.1 6.9 9.1 24.2 B Elbow Wrist 56 53 3  B Elbow 6.6 6.6 0.0 7.4 8.6 14.0 A Elbow B Elbow 53 64 11  A Elbow 8.5 8.0 0.5 6.7 5.7 14.9          Waveforms:

## 2019-08-10 NOTE — Therapy (Signed)
[facility] Outpatient Rehabilitation [facility] [address]  [address] [place], [place], [address] Phone: [phone]   Fax:  [phone]  Physical Therapy Treatment  Patient Details  Name: Kristina Lee MRN: [id number] Date of Birth: May 11, 1945 Referring Provider (PT): Vira Browns, MD   Encounter Date: 08/10/2019  PT End of Session - 08/10/19 1202    Visit Number  2    Number of Visits  13    Date for PT Re-Evaluation  09/14/19    Authorization Type  [employer]    PT Start Time  1105    PT Stop Time  1158    PT Time Calculation (min)  53 min    Activity Tolerance  Patient tolerated treatment well    Behavior During Therapy  [facility] for tasks assessed/performed       Past Medical History:  Diagnosis Date  . Diabetes mellitus without complication (HCC)   . Heart attack (HCC) 09/07/2017  . Urticaria     Past Surgical History:  Procedure Laterality Date  . ABDOMINAL HYSTERECTOMY  2006   b/l SPO and TAH, uterine cancer  . EYE SURGERY Right 1960  . TUBAL LIGATION      There were no vitals filed for this visit.  Subjective Assessment - 08/10/19 1106    Subjective  Has had more achiness all over lately. Had her nerve test last week.    Pertinent History  MI 2019, DM, vascular dementia with behavior disturbance, CAD, GERD, HLD, hypokalemia, lumbosacral DDD, CKD III, HTN, hx of uterine CA    Diagnostic tests  07/18/19 R elbow MRI: Tendinopathy with partial-thickness tearing of the deep attachment fibers of the common extensor tendon. Intact radial and ulnar collateral ligaments. Mild elbow joint degenerative changes    Patient Stated Goals  get rid of pain    Currently in Pain?  Yes    Pain Score  2     Pain Location  Shoulder    Pain Orientation  Right    Pain Descriptors / Indicators  Aching    Pain Type  Acute pain    Pain Radiating Towards  down the forearm         [facility] PT Assessment - 08/10/19 0001      Assessment   Medical Diagnosis   Chronic pain of B shoulders, R elbow sprain, R elbow lateral epicondylitis     Referring Provider (PT)  Vira Browns, MD    Onset Date/Surgical Date  04/22/19         Vestibular Assessment - 08/10/19 0001      Positional Testing   Dix-Hallpike  Dix-Hallpike Right;Dix-Hallpike Left    Sidelying Test  Sidelying Right;Sidelying Left    Horizontal Canal Testing  Horizontal Canal Right;Horizontal Canal Left      Dix-Hallpike Right   Dix-Hallpike Right Symptoms  No nystagmus   dizziness upon sitting ~20 sec     Dix-Hallpike Left   Dix-Hallpike Left Duration  5    Dix-Hallpike Left Symptoms  Upbeat, left rotatory nystagmus;No nystagmus   dizziness and nystagmus upon sitting ~20 sec     Sidelying Right   Sidelying Right Duration  --    Sidelying Right Symptoms  Upbeat, left rotatory nystagmus;No nystagmus      Sidelying Left   Sidelying Left Symptoms  No nystagmus   10 sec dizziness upon sitting      Horizontal Canal Right   Horizontal Canal Right Duration  0    Horizontal  Canal Right Symptoms  --   c/o dizziness     Horizontal Canal Left   Horizontal Canal Left Duration  15    Horizontal Canal Left Symptoms  Normal   R upbeating tortional nystagmus & dizziness              [facility] Adult PT Treatment/Exercise - 08/10/19 0001      Exercises   Exercises  Shoulder;Elbow      Shoulder Exercises: Sidelying   External Rotation  Strengthening;Right;10 reps;Weights    External Rotation Weight (lbs)  1#    ABduction  AROM;Right;10 reps    ABduction Limitations  cues to decrease speed during concentric/eccentric      Shoulder Exercises: Pulleys   Flexion  3 minutes    Flexion Limitations  to tolerance    Scaption  3 minutes    Scaption Limitations  to tolerance      Vestibular Treatment/Exercise - 08/10/19 0001      Vestibular Treatment/Exercise   Vestibular Treatment Provided  Canalith Repositioning    Canalith Repositioning  Epley Manuever Left       EPLEY  MANUEVER LEFT   Number of Reps   1    Overall Response   Improved Symptoms     RESPONSE DETAILS LEFT  no dizziness upon sitting            PT Education - 08/10/19 1201    Education Details  edu on BPPV and PT treatment; edu on post-epley instructions    Person(s) Educated  Patient    Methods  Explanation;Demonstration;Tactile cues;Verbal cues;Handout    Comprehension  Verbalized understanding;Returned demonstration       PT Short Term Goals - 08/10/19 1213      PT SHORT TERM GOAL #1   Title  Patient to be independent with initial HEP.    Time  3    Period  Weeks    Status  On-going    Target Date  08/24/19        PT Long Term Goals - 08/10/19 1213      PT LONG TERM GOAL #1   Title  Patient to be independent with advanced HEP.    Time  6    Period  Weeks    Status  On-going      PT LONG TERM GOAL #2   Title  Patient to demonstrate [facility] and pain-free B shoulder and R elbow AROM.    Time  6    Period  Weeks    Status  On-going      PT LONG TERM GOAL #3   Title  Patient to demonstrate B UE strength >=4+/5 and symmetrical full handed & 3 point pinch grip strength.    Time  6    Period  Weeks    Status  On-going      PT LONG TERM GOAL #4   Title  Patient to report 80% improvement in ability to perform overhead lifting of >5lbs without pain.    Time  6    Period  Weeks    Status  On-going      PT LONG TERM GOAL #5   Title  Patient to perform R UE reaching behind back to don/doff jacket with 0/10 pain.    Time  6    Period  Weeks    Status  On-going            Plan - 08/10/19 1202    Clinical Impression Statement  Patient reporting increased pain in the R shoulder and elbow over the past couple of days without known trigger. Denies considerable pain when performing HEP. While performing sidelying shoulder ER in L sidelying, patient reported spontaneous onset of dizziness. This lasted ~10 seconds before resolution.  Patient denied N/T or  dysarthria/dysphagia. This dizziness returned as patient sat back up from mat and took ~20 sec to resolve. D/t dizziness preventing treatment of shoulder and elbow pain, assessed patient for BPPV. Patient demonstrating very short duration of R upbeating torsional nystagmus in R sidelying test, but more intense dizziness was reported upon sitting from L sidelying. R and L [employer] were tested, which were both negative for nystagmus. However, patient again with intense c/o dizziness upon sitting and visible L upbeating torsional nystagmus upon sitting from L [employer]. Horizonal canal testing also performed, with patient reporting dizziness with R head turn and evident R up beating torsional nystagmus. D/t inconsistency of patient's symptoms and seemingly positional nature of dizziness, performed L Epley for hopeful improvement in patient's symptoms. Patient denied dizziness upon sitting from Epley maneuver and without complaints at end of session. Patient reported understanding of post-epley instructions. Plan to reassess patient's symptoms next session if dizziness returns. Will notify patient's PCP of new onset of dizziness.    Comorbidities  MI 2019, DM, vascular dementia with behavior disturbance, CAD, GERD, HLD, hypokalemia, lumbosacral DDD, CKD III, HTN, hx of uterine CA    PT Treatment/Interventions  ADLs/Self Care Home Management;Cryotherapy;Electrical Stimulation;Iontophoresis 4mg /ml Dexamethasone;Moist Heat;Balance training;Therapeutic exercise;Therapeutic activities;Functional mobility training;Ultrasound;Neuromuscular re-education;Stair training;Gait training;Cognitive remediation;Patient/family education;Manual techniques;Vasopneumatic Device;Taping;Energy conservation;Dry needling;Passive range of motion;Canalith Repostioning    PT Next Visit Plan  reassess HEP and dizziness; gentle wrist extensor stretching and strengthening, gentle grip strengthening to tolerance, periscapular strengthening     Consulted and Agree with Plan of Care  Patient       Patient will benefit from skilled therapeutic intervention in order to improve the following deficits and impairments:  Increased edema, Decreased activity tolerance, Decreased strength, Increased fascial restricitons, Impaired UE functional use, Pain, Increased muscle spasms, Improper body mechanics, Decreased range of motion, Impaired flexibility, Postural dysfunction  Visit Diagnosis: Chronic right shoulder pain  Chronic left shoulder pain  Stiffness of right shoulder, not elsewhere classified  Stiffness of left shoulder, not elsewhere classified  Pain in right elbow  Abnormal posture  Dizziness and giddiness     Problem List Patient Active Problem List   Diagnosis Date Noted  . Right arm pain 07/15/2019  . Hair loss 08/10/2018  . Hearing loss 08/10/2018  . Otitis externa 08/10/2018  . Varicose veins of right lower extremity with complications 05/08/2018  . Vascular dementia with behavior disturbance (HCC) 02/24/2018  . Myalgia 02/09/2018  . Seasonal allergic rhinitis due to pollen 01/21/2018  . Cough 12/23/2017  . Allergic urticaria 12/23/2017  . History of myocardial infarction in last year 12/23/2017  . Diabetes mellitus (HCC) 12/23/2017  . Rash and nonspecific skin eruption 12/23/2017  . Intrinsic atopic dermatitis 12/23/2017  . CAD (coronary artery disease) 09/25/2017  . Low back pain 09/25/2017  . Gastroesophageal reflux disease with esophagitis 09/04/2017  . Hyperlipidemia associated with type 2 diabetes mellitus (HCC) 12/31/2016  . Hypokalemia 10/28/2015  . DDD (degenerative disc disease), lumbosacral 09/13/2015  . Diverticulosis of large intestine 09/13/2015  . Seborrheic dermatitis of scalp 09/13/2015  . Chronic kidney disease, stage III (moderate) 05/02/2015  . Allergic rhinitis 07/14/2014  . Essential hypertension 07/14/2014  . History of uterine cancer 07/14/2014  Anette Guarneri,  PT, DPT 08/10/19 12:16 PM   [facility] Outpatient Rehabilitation [facility] [address]  [address] [place], [place], [address] Phone: [phone]   Fax:  [phone]  Name: Kristina Lee MRN: [id number] Date of Birth: 01/20/1946

## 2019-08-10 NOTE — Progress Notes (Addendum)
Kristina Lee - 74 y.o. female MRN [id number]  Date of birth: [birth year]  Office Visit Note: Visit Date: 08/07/2019 PCP: Bradd Canary, MD Referred by: Bradd Canary, MD  Subjective: Chief Complaint  Patient presents with  . Left Shoulder - Pain  . Right Arm - Pain, Numbness, Tingling  . Right Hand - Pain, Numbness, Tingling   HPI: Kristina Lee is a 74 y.o. female who comes in today Electrodiagnostic study of both upper limbs at the request of Dr. Vira Browns.  Patient is right-hand dominant with pain numbness and tingling in the right arm and right hand particularly the thumb and middle finger.  She gets a lot of pain in the left shoulder but not much down the arm on the left.  Only occasional symptoms in the hand at all on the left.  She reports symptoms started around June 2020.  She reports that lifting anything makes it worse and nothing seems to help.  She has not had prior electrodiagnostic study.  She does have a history of non-insulin-dependent diabetes.  Last hemoglobin A1c was 6.0.  ROS Otherwise per HPI.  Assessment & Plan: Visit Diagnoses:  1. Paresthesia of skin     Plan: Impression: The above electrodiagnostic study is ABNORMAL and reveals evidence of:  1.  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   2.  A mild left median nerve entrapment at the wrist (carpal tunnel syndrome) affecting sensory components.   *There is likely some component of temperature artifact.  There is no significant electrodiagnostic evidence of any other focal nerve entrapment, brachial plexopathy or cervical radiculopathy.     Recommendations: 1.  Follow-up with referring physician. 2.  Continue current management of symptoms. 3.  Continue use of resting splint at night-time and as needed during the day.   Meds & Orders: No orders of the defined types were placed in this encounter.     Orders Placed This Encounter  Procedures  . NCV with EMG (electromyography)    Follow-up: Return for Vira Browns, MD.   Procedures: No procedures performed  EMG & NCV Findings: Evaluation of the right median motor nerve showed decreased conduction velocity (Elbow-Wrist, 46 m/s).  The left median (across palm) sensory and the right median (across palm) sensory nerves showed prolonged distal peak latency (Wrist, L4.6, R4.6 ms) and prolonged distal peak latency (Palm, L2.6, R2.6 ms).  The left ulnar sensory and the right ulnar sensory nerves showed prolonged distal peak latency (L4.2, R4.0 ms) and decreased conduction velocity (Wrist-5th Digit, L33, R35 m/s).  All remaining nerves (as indicated in the following tables) were within normal limits.  All left vs. right side differences were within normal limits.    All examined muscles (as indicated in the following table) showed no evidence of electrical instability.    Impression: The above electrodiagnostic study is ABNORMAL and reveals evidence of:  1.  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   2.  A mild left median nerve entrapment at the wrist (carpal tunnel syndrome) affecting sensory components.   *There is likely some component of temperature artifact.  There is no significant electrodiagnostic evidence of any other focal nerve entrapment, brachial plexopathy or cervical radiculopathy.     Recommendations: 1.  Follow-up with referring physician. 2.  Continue current management of symptoms. 3.  Continue use  of resting splint at night-time and as needed during the day.  ___________________________ Elease Hashimoto Board Certified, American Board of Physical Medicine and Rehabilitation    Nerve Conduction Studies Anti Sensory Summary Table   Stim Site NR Peak (ms) Norm Peak (ms) P-T Amp (V) Norm P-T Amp Site1 Site2 Delta-P (ms) Dist (cm)  Vel (m/s) Norm Vel (m/s)  Left Median Acr Palm Anti Sensory (2nd Digit)  29.1C  Wrist    *4.6 <3.6 25.6 >10 Wrist Palm 2.0 0.0    Palm    *2.6 <2.0 17.9         Right Median Acr Palm Anti Sensory (2nd Digit)  28.8C  Wrist    *4.6 <3.6 29.8 >10 Wrist Palm 2.0 0.0    Palm    *2.6 <2.0 32.8         Left Radial Anti Sensory (Base 1st Digit)  28.8C  Wrist    2.3 <3.1 14.7  Wrist Base 1st Digit 2.3 0.0    Right Radial Anti Sensory (Base 1st Digit)  28.4C  Wrist    2.4 <3.1 20.9  Wrist Base 1st Digit 2.4 0.0    Left Ulnar Anti Sensory (5th Digit)  29.2C  Wrist    *4.2 <3.7 17.6 >15.0 Wrist 5th Digit 4.2 14.0 *33 >38  Right Ulnar Anti Sensory (5th Digit)  28.7C  Wrist    *4.0 <3.7 21.6 >15.0 Wrist 5th Digit 4.0 14.0 *35 >38   Motor Summary Table   Stim Site NR Onset (ms) Norm Onset (ms) O-P Amp (mV) Norm O-P Amp Site1 Site2 Delta-0 (ms) Dist (cm) Vel (m/s) Norm Vel (m/s)  Left Median Motor (Abd Poll Brev)  28.9C  Wrist    3.8 <4.2 9.5 >5 Elbow Wrist 3.5 18.0 51 >50  Elbow    7.3  4.7         Right Median Motor (Abd Poll Brev)  28.3C  Wrist    3.8 <4.2 9.9 >5 Elbow Wrist 3.9 18.0 *46 >50  Elbow    7.7  4.1         Left Ulnar Motor (Abd Dig Min)  29C  Wrist    3.5 <4.2 6.9 >3 B Elbow Wrist 3.1 17.5 56 >53  B Elbow    6.6  7.4  A Elbow B Elbow 1.9 10.0 53 >53  A Elbow    8.5  6.7         Right Ulnar Motor (Abd Dig Min)  28.3C  Wrist    3.4 <4.2 9.1 >3 B Elbow Wrist 3.2 17.0 53 >53  B Elbow    6.6  8.6  A Elbow B Elbow 1.4 9.0 64 >53  A Elbow    8.0  5.7          EMG   Side Muscle Nerve Root Ins Act Fibs Psw Amp Dur Poly Recrt Int Dennie Bible Comment  Right Abd Poll Brev Median C8-T1 Nml Nml Nml Nml Nml 0 Nml Nml   Right 1stDorInt Ulnar C8-T1 Nml Nml Nml Nml Nml 0 Nml Nml   Right PronatorTeres Median C6-7 Nml Nml Nml Nml Nml 0 Nml Nml   Right Biceps Musculocut C5-6 Nml Nml Nml Nml Nml 0 Nml Nml   Right Deltoid Axillary C5-6 Nml Nml Nml Nml Nml 0 Nml Nml     Nerve Conduction  Studies Anti Sensory Left/Right Comparison   Stim Site L Lat (ms) R Lat (ms) L-R Lat (ms) L Amp (V) R Amp (V) L-R  Amp (%) Site1 Site2 L Vel (m/s) R Vel (m/s) L-R Vel (m/s)  Median Acr Palm Anti Sensory (2nd Digit)  29.1C  Wrist *4.6 *4.6 0.0 25.6 29.8 14.1 Wrist Palm     Palm *2.6 *2.6 0.0 17.9 32.8 45.4       Radial Anti Sensory (Base 1st Digit)  28.8C  Wrist 2.3 2.4 0.1 14.7 20.9 29.7 Wrist Base 1st Digit     Ulnar Anti Sensory (5th Digit)  29.2C  Wrist *4.2 *4.0 0.2 17.6 21.6 18.5 Wrist 5th Digit *33 *35 2   Motor Left/Right Comparison   Stim Site L Lat (ms) R Lat (ms) L-R Lat (ms) L Amp (mV) R Amp (mV) L-R Amp (%) Site1 Site2 L Vel (m/s) R Vel (m/s) L-R Vel (m/s)  Median Motor (Abd Poll Brev)  28.9C  Wrist 3.8 3.8 0.0 9.5 9.9 4.0 Elbow Wrist 51 *46 5  Elbow 7.3 7.7 0.4 4.7 4.1 12.8       Ulnar Motor (Abd Dig Min)  29C  Wrist 3.5 3.4 0.1 6.9 9.1 24.2 B Elbow Wrist 56 53 3  B Elbow 6.6 6.6 0.0 7.4 8.6 14.0 A Elbow B Elbow 53 64 11  A Elbow 8.5 8.0 0.5 6.7 5.7 14.9          Waveforms:                      Clinical History: No specialty comments available.   She reports that she quit smoking about 32 years ago. Her smoking use included cigarettes. She has a 12.00 pack-year smoking history. She has never used smokeless tobacco.  Recent Labs    05/18/19 1538  HGBA1C 6.0    Objective:  VS:  HT:    WT:   BMI:     BP:   HR: bpm  TEMP: ( )  RESP:  Physical Exam Musculoskeletal:        General: No swelling, tenderness or deformity.     Comments: Inspection reveals no atrophy of the bilateral APB or FDI or hand intrinsics. There is no swelling, color changes, allodynia or dystrophic changes. There is 5 out of 5 strength in the bilateral wrist extension, finger abduction and long finger flexion. There is intact sensation to light touch in all dermatomal and peripheral nerve distributions. There is a negative Froment's test bilaterally. There is a negative Tinel's  test at the bilateral wrist and elbow. There is a negative Phalen's test bilaterally. There is a negative Hoffmann's test bilaterally.  Skin:    General: Skin is warm and dry.     Findings: No erythema or rash.  Neurological:     General: No focal deficit present.     Mental Status: She is alert and oriented to person, place, and time.     Motor: No weakness or abnormal muscle tone.     Coordination: Coordination normal.  Psychiatric:        Mood and Affect: Mood normal.        Behavior: Behavior normal.     Ortho Exam  Imaging: No results found.  Past Medical/Family/Surgical/Social History: Medications & Allergies reviewed per EMR, new medications updated. Patient Active Problem List   Diagnosis Date Noted  . Right arm pain 07/15/2019  . Hair loss 08/10/2018  . Hearing loss 08/10/2018  . Otitis externa 08/10/2018  . Varicose veins of right lower extremity with complications 05/08/2018  . Vascular dementia with behavior disturbance (HCC) 02/24/2018  . Myalgia 02/09/2018  .  Seasonal allergic rhinitis due to pollen 01/21/2018  . Cough 12/23/2017  . Allergic urticaria 12/23/2017  . History of myocardial infarction in last year 12/23/2017  . Diabetes mellitus ([place]) 12/23/2017  . Rash and nonspecific skin eruption 12/23/2017  . Intrinsic atopic dermatitis 12/23/2017  . CAD (coronary artery disease) 09/25/2017  . Low back pain 09/25/2017  . Gastroesophageal reflux disease with esophagitis 09/04/2017  . Hyperlipidemia associated with type 2 diabetes mellitus ([place]) 12/31/2016  . Hypokalemia 10/28/2015  . DDD (degenerative disc disease), lumbosacral 09/13/2015  . Diverticulosis of large intestine 09/13/2015  . Seborrheic dermatitis of scalp 09/13/2015  . Chronic kidney disease, stage III (moderate) 05/02/2015  . Allergic rhinitis 07/14/2014  . Essential hypertension 07/14/2014  . History of uterine cancer 07/14/2014   Past Medical History:  Diagnosis Date  . Diabetes  mellitus without complication ([place])   . Heart attack ([place]) 09/07/2017  . Urticaria    Family History  Problem Relation Age of Onset  . Cancer Mother        liver cancer, breast cancer x 2   . Alcohol abuse Mother        smoker  . Heart disease Father        MI  . Alcohol abuse Father        smoker  . Depression Sister   . Tremor Sister   . Cancer Brother        pancreatic  . Healthy Son   . Diabetes Maternal Grandmother   . Hearing loss Maternal Grandmother   . Depression Maternal Grandmother   . Allergic rhinitis Neg Hx   . Angioedema Neg Hx   . Asthma Neg Hx   . Eczema Neg Hx   . Immunodeficiency Neg Hx   . Urticaria Neg Hx    Past Surgical History:  Procedure Laterality Date  . ABDOMINAL HYSTERECTOMY  2006   b/l SPO and TAH, uterine cancer  . EYE SURGERY Right 1960  . TUBAL LIGATION     Social History   Occupational History  . Not on file  Tobacco Use  . Smoking status: Former Smoker    Packs/day: 0.50    Years: 24.00    Pack years: 12.00    Types: Cigarettes    Quit date: 04/17/1987    Years since quitting: 32.3  . Smokeless tobacco: Never Used  Substance and Sexual Activity  . Alcohol use: Yes    Comment: occ  . Drug use: Not Currently  . Sexual activity: Yes

## 2019-08-13 ENCOUNTER — Other Ambulatory Visit: Payer: Self-pay

## 2019-08-13 ENCOUNTER — Ambulatory Visit: Payer: Medicare PPO | Admitting: Physical Therapy

## 2019-08-13 ENCOUNTER — Encounter: Payer: Self-pay | Admitting: Physical Therapy

## 2019-08-13 DIAGNOSIS — R293 Abnormal posture: Secondary | ICD-10-CM

## 2019-08-13 DIAGNOSIS — M25511 Pain in right shoulder: Secondary | ICD-10-CM | POA: Diagnosis not present

## 2019-08-13 DIAGNOSIS — M25611 Stiffness of right shoulder, not elsewhere classified: Secondary | ICD-10-CM | POA: Diagnosis not present

## 2019-08-13 DIAGNOSIS — G8929 Other chronic pain: Secondary | ICD-10-CM

## 2019-08-13 DIAGNOSIS — M25521 Pain in right elbow: Secondary | ICD-10-CM

## 2019-08-13 DIAGNOSIS — M25512 Pain in left shoulder: Secondary | ICD-10-CM | POA: Diagnosis not present

## 2019-08-13 DIAGNOSIS — R42 Dizziness and giddiness: Secondary | ICD-10-CM

## 2019-08-13 DIAGNOSIS — M25612 Stiffness of left shoulder, not elsewhere classified: Secondary | ICD-10-CM

## 2019-08-13 NOTE — Therapy (Signed)
[facility] [address]  [place], [place], [address] Phone: [phone]   Fax:  [phone]  Physical Therapy Treatment  Patient Details  Name: Kristina Lee MRN: [id number] Date of Birth: 19-Aug-1945 Referring Provider (PT): Basil Dess, MD   Encounter Date: 08/13/2019  PT End of Session - 08/13/19 1146    Visit Number  3    Number of Visits  13    Date for PT Re-Evaluation  09/14/19    Authorization Type  [employer]    PT Start Time  1104    PT Stop Time  1152   ice pack   PT Time Calculation (min)  48 min    Activity Tolerance  Patient tolerated treatment well    Behavior During Therapy  [facility] for tasks assessed/performed       Past Medical History:  Diagnosis Date  . Diabetes mellitus without complication ([place])   . Heart attack ([place]) 09/07/2017  . Urticaria     Past Surgical History:  Procedure Laterality Date  . ABDOMINAL HYSTERECTOMY  2006   b/l SPO and TAH, uterine cancer  . EYE SURGERY Right 1960  . TUBAL LIGATION      There were no vitals filed for this visit.  Subjective Assessment - 08/13/19 1103    Subjective  Has continued to sleep with 3 pillows and avoiding L side while sleeping. Has not had dizziness. Has been having achiness in her body.    Pertinent History  MI 2019, DM, vascular dementia with behavior disturbance, CAD, GERD, HLD, hypokalemia, lumbosacral DDD, CKD III, HTN, hx of uterine CA    Diagnostic tests  07/18/19 R elbow MRI: Tendinopathy with partial-thickness tearing of the deep attachment fibers of the common extensor tendon. Intact radial and ulnar collateral ligaments. Mild elbow joint degenerative changes    Patient Stated Goals  get rid of pain    Currently in Pain?  Yes    Pain Score  1     Pain Location  Shoulder    Pain Orientation  Right    Pain Descriptors / Indicators  Aching    Pain Type  Acute pain    Pain Score  1    Pain Location  Elbow    Pain  Orientation  Right    Pain Descriptors / Indicators  Aching    Pain Type  Acute pain                       [facility] Adult PT Treatment/Exercise - 08/13/19 0001      Exercises   Exercises  Wrist      Elbow Exercises   Elbow Flexion  AROM;Strengthening;Right;10 reps;Seated    Elbow Flexion Limitations  10x AROM; 10x 1#   mild pain in olecranon     Shoulder Exercises: Seated   External Rotation  AAROM;Right;10 reps    External Rotation Limitations  with wand & towel roll under elbow; to tolerance    Internal Rotation  AAROM;Right;10 reps    Internal Rotation Limitations  with wand; to tolerance    Flexion  AAROM;Right;10 reps    Flexion Limitations  with wand; to tolerance    Abduction  AAROM;Right;10 reps    ABduction Limitations  with wand; to tolerance   manual cues for proper alignment     Shoulder Exercises: Sidelying   External Rotation  Strengthening;Right;10 reps;Weights    External Rotation Weight (lbs)  1#  External Rotation Limitations  cues for scap retraction    ABduction  Strengthening;Right;10 reps    ABduction Weight (lbs)  1#    ABduction Limitations  thumb up      Shoulder Exercises: Pulleys   Flexion  3 minutes    Flexion Limitations  to tolerance    Scaption  3 minutes    Scaption Limitations  to tolerance      Wrist Exercises   Other wrist exercises  R wrist flexion & extension stretch 2x30" each to tolerance    Forearm Supination  Strengthening;Right;10 reps;Seated    Bar Weights/Barbell (Forearm Supination)  1 lb    Forearm Supination Limitations  held at end of weight      Modalities   Modalities  Cryotherapy      Cryotherapy   Number Minutes Cryotherapy  10 Minutes   2 pillow cases   Cryotherapy Location  --   R elbow   Type of Cryotherapy  Ice pack             PT Education - 08/13/19 1145    Education Details  advisde patient to return to normal sleeping positioning to assess for dizziness    Person(s) Educated   Patient    Methods  Explanation    Comprehension  Verbalized understanding       PT Short Term Goals - 08/10/19 1213      PT SHORT TERM GOAL #1   Title  Patient to be independent with initial HEP.    Time  3    Period  Weeks    Status  On-going    Target Date  08/24/19        PT Long Term Goals - 08/10/19 1213      PT LONG TERM GOAL #1   Title  Patient to be independent with advanced HEP.    Time  6    Period  Weeks    Status  On-going      PT LONG TERM GOAL #2   Title  Patient to demonstrate [facility] and pain-free B shoulder and R elbow AROM.    Time  6    Period  Weeks    Status  On-going      PT LONG TERM GOAL #3   Title  Patient to demonstrate B UE strength >=4+/5 and symmetrical full handed & 3 point pinch grip strength.    Time  6    Period  Weeks    Status  On-going      PT LONG TERM GOAL #4   Title  Patient to report 80% improvement in ability to perform overhead lifting of >5lbs without pain.    Time  6    Period  Weeks    Status  On-going      PT LONG TERM GOAL #5   Title  Patient to perform R UE reaching behind back to don/doff jacket with 0/10 pain.    Time  6    Period  Weeks    Status  On-going            Plan - 08/13/19 1147    Clinical Impression Statement  Patient reporting no dizziness since last session, however has not returned to normal sleeping positions. Reporting improvement in R elbow and shoulder pain. Patient was able to tolerate R shoulder AAROM to tolerance. Required heavy manual cueing for proper positioning and alignment. Able to perform light weighted elbow flexion with mild pain in olecranon. Patient  performed exercises in L sidelying without complaints of dizziness and able to transfer up to sitting without complaints as well. Increased weighted resistance in sidelying for shoulder strengthening without pain. Ended session with ice pack to R elbow for pain relief. Patient reported mild R buttock pain upon transferring from supine,  but no further complaints at end of session.    Comorbidities  MI 2019, DM, vascular dementia with behavior disturbance, CAD, GERD, HLD, hypokalemia, lumbosacral DDD, CKD III, HTN, hx of uterine CA    PT Treatment/Interventions  ADLs/Self Care Home Management;Cryotherapy;Electrical Stimulation;Iontophoresis 4mg /ml Dexamethasone;Moist Heat;Balance training;Therapeutic exercise;Therapeutic activities;Functional mobility training;Ultrasound;Neuromuscular re-education;Stair training;Gait training;Cognitive remediation;Patient/family education;Manual techniques;Vasopneumatic Device;Taping;Energy conservation;Dry needling;Passive range of motion;Canalith Repostioning    PT Next Visit Plan  reassess HEP and dizziness; gentle wrist extensor stretching and strengthening, gentle grip strengthening to tolerance, periscapular strengthening    Consulted and Agree with Plan of Care  Patient       Patient will benefit from skilled therapeutic intervention in order to improve the following deficits and impairments:  Increased edema, Decreased activity tolerance, Decreased strength, Increased fascial restricitons, Impaired UE functional use, Pain, Increased muscle spasms, Improper body mechanics, Decreased range of motion, Impaired flexibility, Postural dysfunction  Visit Diagnosis: Chronic right shoulder pain  Chronic left shoulder pain  Stiffness of right shoulder, not elsewhere classified  Stiffness of left shoulder, not elsewhere classified  Pain in right elbow  Abnormal posture  Dizziness and giddiness     Problem List Patient Active Problem List   Diagnosis Date Noted  . Right arm pain 07/15/2019  . Hair loss 08/10/2018  . Hearing loss 08/10/2018  . Otitis externa 08/10/2018  . Varicose veins of right lower extremity with complications 05/08/2018  . Vascular dementia with behavior disturbance ([facility]) 02/24/2018  . Myalgia 02/09/2018  . Seasonal allergic rhinitis due to pollen 01/21/2018  .  Cough 12/23/2017  . Allergic urticaria 12/23/2017  . History of myocardial infarction in last year 12/23/2017  . Diabetes mellitus ([facility]) 12/23/2017  . Rash and nonspecific skin eruption 12/23/2017  . Intrinsic atopic dermatitis 12/23/2017  . CAD (coronary artery disease) 09/25/2017  . Low back pain 09/25/2017  . Gastroesophageal reflux disease with esophagitis 09/04/2017  . Hyperlipidemia associated with type 2 diabetes mellitus ([facility]) 12/31/2016  . Hypokalemia 10/28/2015  . DDD (degenerative disc disease), lumbosacral 09/13/2015  . Diverticulosis of large intestine 09/13/2015  . Seborrheic dermatitis of scalp 09/13/2015  . Chronic kidney disease, stage III (moderate) 05/02/2015  . Allergic rhinitis 07/14/2014  . Essential hypertension 07/14/2014  . History of uterine cancer 07/14/2014     07/16/2014, PT, DPT 08/13/19 11:57 AM   [facility] [address]  [address] [place], [place], [place] Phone: [phone]   Fax:  [phone]  Name: Lezley Bedgood MRN: Halford Decamp Date of Birth: 1945-12-21

## 2019-08-17 ENCOUNTER — Encounter: Payer: Self-pay | Admitting: Physical Therapy

## 2019-08-17 ENCOUNTER — Ambulatory Visit: Payer: Medicare PPO | Attending: Specialist | Admitting: Physical Therapy

## 2019-08-17 ENCOUNTER — Other Ambulatory Visit: Payer: Self-pay

## 2019-08-17 DIAGNOSIS — M25511 Pain in right shoulder: Secondary | ICD-10-CM | POA: Insufficient documentation

## 2019-08-17 DIAGNOSIS — R293 Abnormal posture: Secondary | ICD-10-CM | POA: Diagnosis not present

## 2019-08-17 DIAGNOSIS — M25512 Pain in left shoulder: Secondary | ICD-10-CM | POA: Diagnosis not present

## 2019-08-17 DIAGNOSIS — M25611 Stiffness of right shoulder, not elsewhere classified: Secondary | ICD-10-CM | POA: Insufficient documentation

## 2019-08-17 DIAGNOSIS — R42 Dizziness and giddiness: Secondary | ICD-10-CM | POA: Diagnosis not present

## 2019-08-17 DIAGNOSIS — G8929 Other chronic pain: Secondary | ICD-10-CM | POA: Insufficient documentation

## 2019-08-17 DIAGNOSIS — M25521 Pain in right elbow: Secondary | ICD-10-CM | POA: Diagnosis not present

## 2019-08-17 DIAGNOSIS — M25612 Stiffness of left shoulder, not elsewhere classified: Secondary | ICD-10-CM | POA: Insufficient documentation

## 2019-08-17 NOTE — Therapy (Signed)
[facility] [address]  [place], [place], [address] Phone: [phone]   Fax:  [phone]  Physical Therapy Treatment  Patient Details  Name: Kristina Lee MRN: [id number] Date of Birth: Dec 09, 1945 Referring Provider (PT): Basil Dess, MD   Encounter Date: 08/17/2019  PT End of Session - 08/17/19 1151    Visit Number  4    Number of Visits  13    Date for PT Re-Evaluation  09/14/19    Authorization Type  [employer]    PT Start Time  1100    PT Stop Time  1146    PT Time Calculation (min)  46 min    Activity Tolerance  Patient tolerated treatment well;Patient limited by pain    Behavior During Therapy  [facility] for tasks assessed/performed       Past Medical History:  Diagnosis Date  . Diabetes mellitus without complication ([place])   . Heart attack ([place]) 09/07/2017  . Urticaria     Past Surgical History:  Procedure Laterality Date  . ABDOMINAL HYSTERECTOMY  2006   b/l SPO and TAH, uterine cancer  . EYE SURGERY Right 1960  . TUBAL LIGATION      There were no vitals filed for this visit.  Subjective Assessment - 08/17/19 1100    Subjective  Has a HA this AM. Feeling better than a few weeks ago. Will be seeing Dr. Louanne Skye soon to review her nerve test. Has returned to sleeping normally and able to perform bed mobility without dizziness.    Pertinent History  MI 2019, DM, vascular dementia with behavior disturbance, CAD, GERD, HLD, hypokalemia, lumbosacral DDD, CKD III, HTN, hx of uterine CA    Diagnostic tests  07/18/19 R elbow MRI: Tendinopathy with partial-thickness tearing of the deep attachment fibers of the common extensor tendon. Intact radial and ulnar collateral ligaments. Mild elbow joint degenerative changes    Patient Stated Goals  get rid of pain    Currently in Pain?  Yes    Pain Score  4    Pain Location  Elbow    Pain Orientation  Right    Pain Descriptors / Indicators  Shooting    Pain  Type  Acute pain    Pain Radiating Towards  R forearm         [facility] PT Assessment - 08/17/19 0001      AROM   Right Shoulder Flexion  140 Degrees    Right Shoulder ABduction  153 Degrees    Right Shoulder Internal Rotation  --   FIR T8   Right Shoulder External Rotation  --   FER T1   Left Shoulder Flexion  138 Degrees    Left Shoulder ABduction  139 Degrees    Left Shoulder Internal Rotation  --   FIR T10   Left Shoulder External Rotation  --   FER T1   Right Elbow Flexion  148    Right Elbow Extension  18   pain     Strength   Right Shoulder Flexion  4/5    Right Shoulder ABduction  4+/5    Right Shoulder Internal Rotation  4/5    Right Shoulder External Rotation  4/5    Left Shoulder Flexion  4+/5    Left Shoulder ABduction  4+/5    Left Shoulder Internal Rotation  4+/5    Left Shoulder External Rotation  4+/5    Right Elbow Flexion  4+/5  Right Elbow Extension  4/5    Right Hand Grip (lbs)  3   3, 3, 3   Right Hand 3 Point Pinch  5 lbs   3, 5, 7                  [facility] Adult PT Treatment/Exercise - 08/17/19 0001      Self-Care   Self-Care  Other Self-Care Comments    Other Self-Care Comments   review of R grip strengthening using yellow putty- full handed grip, 3 point pinch, tip pinch- couple reps of each      Shoulder Exercises: Pulleys   Flexion  3 minutes    Flexion Limitations  to tolerance    Scaption  3 minutes    Scaption Limitations  to tolerance      Wrist Exercises   Other wrist exercises  R wrist flexion stretch 2x30"    Other wrist exercises  R eccentric wrist flexion with 1# 2x10   concentric phase PROM            PT Education - 08/17/19 1159    Education Details  update to HEP; edu on KT tape benefits    Person(s) Educated  Patient    Methods  Explanation;Demonstration;Tactile cues;Handout;Verbal cues    Comprehension  Verbalized understanding;Returned demonstration       PT Short Term Goals - 08/17/19 1107       PT SHORT TERM GOAL #1   Title  Patient to be independent with initial HEP.    Time  3    Period  Weeks    Status  Achieved    Target Date  08/24/19        PT Long Term Goals - 08/17/19 1108      PT LONG TERM GOAL #1   Title  Patient to be independent with advanced HEP.    Time  6    Period  Weeks    Status  Partially Met   met for current     PT LONG TERM GOAL #2   Title  Patient to demonstrate [facility] and pain-free B shoulder and R elbow AROM.    Time  6    Period  Weeks    Status  On-going      PT LONG TERM GOAL #3   Title  Patient to demonstrate B UE strength >=4+/5 and symmetrical full handed & 3 point pinch grip strength.    Time  6    Period  Weeks    Status  On-going      PT LONG TERM GOAL #4   Title  Patient to report 80% improvement in ability to perform overhead lifting of >5lbs without pain.    Time  6    Period  Weeks    Status  On-going   avoiding lifting d/t pain     PT LONG TERM GOAL #5   Title  Patient to perform R UE reaching behind back to don/doff jacket with 0/10 pain.    Time  6    Period  Weeks    Status  On-going   reporting 2/10 pain while simulating this activity           Plan - 08/17/19 1151    Clinical Impression Statement  Patient reports slight improvement with therapy d/t pain levels in R UE, however still having pain and difficulty when picking up objects. AROM has improved in R shoulder flexion, abduction, and ER as well as  R elbow flexion AROM. Strength testing revealed improvement in L shoulder flexion, R elbow flexion, and grip strength. Patient still reporting 2/10 pain while reaching behind the back with R UE in order to simulate donning/doffing a jacket. Reviewed grip strengthening activities using theraputty resistance with patient reporting good understanding. Updated these exercises into HEP. Worked on light eccentric wrist extensor strengthening with patient reporting forearm muscle fatigue and delayed R elbow pain after  completing this exercise. Patient declined modalities at end of session. Patient is progressing slowly towards goals. No longer limited by dizziness with ther-ex.    Comorbidities  MI 2019, DM, vascular dementia with behavior disturbance, CAD, GERD, HLD, hypokalemia, lumbosacral DDD, CKD III, HTN, hx of uterine CA    PT Treatment/Interventions  ADLs/Self Care Home Management;Cryotherapy;Electrical Stimulation;Iontophoresis 8m/ml Dexamethasone;Moist Heat;Balance training;Therapeutic exercise;Therapeutic activities;Functional mobility training;Ultrasound;Neuromuscular re-education;Stair training;Gait training;Cognitive remediation;Patient/family education;Manual techniques;Vasopneumatic Device;Taping;Energy conservation;Dry needling;Passive range of motion;Canalith Repostioning    PT Next Visit Plan  gentle wrist extensor stretching and strengthening, gentle grip strengthening to tolerance, periscapular strengthening    Consulted and Agree with Plan of Care  Patient       Patient will benefit from skilled therapeutic intervention in order to improve the following deficits and impairments:  Increased edema, Decreased activity tolerance, Decreased strength, Increased fascial restricitons, Impaired UE functional use, Pain, Increased muscle spasms, Improper body mechanics, Decreased range of motion, Impaired flexibility, Postural dysfunction  Visit Diagnosis: Chronic right shoulder pain  Chronic left shoulder pain  Stiffness of right shoulder, not elsewhere classified  Stiffness of left shoulder, not elsewhere classified  Pain in right elbow  Abnormal posture  Dizziness and giddiness     Problem List Patient Active Problem List   Diagnosis Date Noted  . Right arm pain 07/15/2019  . Hair loss 08/10/2018  . Hearing loss 08/10/2018  . Otitis externa 08/10/2018  . Varicose veins of right lower extremity with complications 010/21/1173 . Vascular dementia with behavior disturbance ([place]  02/24/2018  . Myalgia 02/09/2018  . Seasonal allergic rhinitis due to pollen 01/21/2018  . Cough 12/23/2017  . Allergic urticaria 12/23/2017  . History of myocardial infarction in last year 12/23/2017  . Diabetes [place] ([place] 12/23/2017  . Rash and nonspecific skin eruption 12/23/2017  . Intrinsic atopic dermatitis 12/23/2017  . CAD (coronary artery disease) 09/25/2017  . Low back pain 09/25/2017  . Gastroesophageal reflux disease with esophagitis 09/04/2017  . Hyperlipidemia associated with type 2 diabetes mellitus ([place] 12/31/2016  . Hypokalemia 10/28/2015  . DDD (degenerative disc disease), lumbosacral 09/13/2015  . Diverticulosis of large intestine 09/13/2015  . Seborrheic dermatitis of scalp 09/13/2015  . Chronic kidney disease, stage III (moderate) 05/02/2015  . Allergic rhinitis 07/14/2014  . Essential hypertension 07/14/2014  . History of uterine cancer 07/14/2014     YJanene Harvey PT, DPT 08/17/19 12:00 PM   [facility] [place] [address]Phone: [phone]  Fax:  [phone] Name: JLatrena BenegasMRN: [id number] of Birth: 505/17/1947

## 2019-08-17 NOTE — Patient Instructions (Addendum)
   Kinesiology tape  What is kinesiology tape?  There are many brands of kinesiology tape. KTape, Rock Tape, Body Sport, Dynamic tape, to name a few.  It is an elasticized tape designed to support the body's natural healing process. This tape provides stability and support to muscles and joints without restricting motion.  It can also help decrease swelling in the area of application.  How does it work?  The tape microscopically lifts and decompresses the skin to allow for drainage of lymph (swelling) to flow away from area, reducing inflammation. The tape has the ability to help re-educate the neuromuscular system by targeting specific receptors in the skin. The presence of the tape increases the body's awareness of posture and body mechanics.  Do not use with:  . Open wounds . Skin lesions . Adhesive allergies  In some rare cases, mild/moderate skin irritation can occur. This can include redness, itchiness, or hives. If this occurs, immediately remove tape and consult your primary care physician if symptoms are severe or do not resolve within 2 days.  Safe removal of the tape:  To remove tape safely, hold nearby skin with one hand and gentle roll tape down with other hand. You can apply oil or conditioner to tape while in shower prior to removal to loosen adhesive. DO NOT swiftly rip tape off like a band-aid, as this could cause skin tears and additional skin irritation.     For questions, please contact your therapist at:  [facility] [place] [address]  [address] [place], [place], [address] Phone: [phone]   Fax:  [phone]     

## 2019-08-19 ENCOUNTER — Encounter: Payer: Self-pay | Admitting: Physical Therapy

## 2019-08-19 ENCOUNTER — Other Ambulatory Visit: Payer: Self-pay

## 2019-08-19 ENCOUNTER — Ambulatory Visit: Payer: Medicare PPO | Admitting: Physical Therapy

## 2019-08-19 DIAGNOSIS — M25612 Stiffness of left shoulder, not elsewhere classified: Secondary | ICD-10-CM | POA: Diagnosis not present

## 2019-08-19 DIAGNOSIS — R42 Dizziness and giddiness: Secondary | ICD-10-CM | POA: Diagnosis not present

## 2019-08-19 DIAGNOSIS — R293 Abnormal posture: Secondary | ICD-10-CM

## 2019-08-19 DIAGNOSIS — G8929 Other chronic pain: Secondary | ICD-10-CM | POA: Diagnosis not present

## 2019-08-19 DIAGNOSIS — M25511 Pain in right shoulder: Secondary | ICD-10-CM | POA: Diagnosis not present

## 2019-08-19 DIAGNOSIS — M25611 Stiffness of right shoulder, not elsewhere classified: Secondary | ICD-10-CM

## 2019-08-19 DIAGNOSIS — M25521 Pain in right elbow: Secondary | ICD-10-CM

## 2019-08-19 DIAGNOSIS — M25512 Pain in left shoulder: Secondary | ICD-10-CM

## 2019-08-19 NOTE — Therapy (Signed)
[facility] [address]  [place], [place], [address] Phone: [phone]   Fax:  [phone]  Physical Therapy Treatment  Patient Details  Name: Kristina Lee MRN: [id number] Date of Birth: 11-21-1945 Referring Provider (PT): Basil Dess, MD   Encounter Date: 08/19/2019  PT End of Session - 08/19/19 1059    Visit Number  5    Number of Visits  13    Date for PT Re-Evaluation  09/14/19    Authorization Type  [employer]    PT Start Time  3710   pt late   PT Stop Time  1109    PT Time Calculation (min)  48 min    Activity Tolerance  Patient tolerated treatment well;Patient limited by pain    Behavior During Therapy  [facility] for tasks assessed/performed       Past Medical History:  Diagnosis Date  . Diabetes mellitus without complication ([place])   . Heart attack ([place]) 09/07/2017  . Urticaria     Past Surgical History:  Procedure Laterality Date  . ABDOMINAL HYSTERECTOMY  2006   b/l SPO and TAH, uterine cancer  . EYE SURGERY Right 1960  . TUBAL LIGATION      There were no vitals filed for this visit.  Subjective Assessment - 08/19/19 1020    Subjective  Has been having increased pain in the R forearm for the past 2 days. Feels like maybe she has been squeezing too hard with the putty exercises.    Pertinent History  MI 2019, DM, vascular dementia with behavior disturbance, CAD, GERD, HLD, hypokalemia, lumbosacral DDD, CKD III, HTN, hx of uterine CA    Diagnostic tests  07/18/19 R elbow MRI: Tendinopathy with partial-thickness tearing of the deep attachment fibers of the common extensor tendon. Intact radial and ulnar collateral ligaments. Mild elbow joint degenerative changes    Patient Stated Goals  get rid of pain    Currently in Pain?  No/denies                       [facility] Adult PT Treatment/Exercise - 08/19/19 0001      Exercises   Exercises  Hand      Elbow Exercises   Other  elbow exercises  R elbow extension stretch with hand supinated holding on back of mat table 2x20" to tolerance      Shoulder Exercises: Standing   Extension  Strengthening;Both;10 reps;Theraband    Theraband Level (Shoulder Extension)  Level 2 (Red)    Extension Limitations  cues for form   slightly increased difficulty   Row  Strengthening;Both;10 reps;Theraband    Theraband Level (Shoulder Row)  Level 2 (Red)    Row Limitations  2x10; cues to encourage scap retraction       Shoulder Exercises: Pulleys   Flexion  3 minutes    Flexion Limitations  to tolerance    Scaption  3 minutes    Scaption Limitations  to tolerance      Shoulder Exercises: Stretch   Corner Stretch  2 reps;30 seconds    Corner Stretch Limitations  R shoulder mid pec stretch in doorway      Hand Exercises   Other Hand Exercises  yellow putty exercises: R full handed grip 5x with full putty, 5x with 1/2 putty, 10x key pinch, 10x 3 pt pinch      Wrist Exercises   Other wrist exercises  R wrist flexion  stretch 2x30"      Modalities   Modalities  Moist Heat      Moist Heat Therapy   Number Minutes Moist Heat  10 Minutes    Moist Heat Location  Elbow   R            PT Education - 08/19/19 1058    Education Details  review of HEP    Person(s) Educated  Patient    Methods  Explanation;Demonstration;Tactile cues;Verbal cues    Comprehension  Verbalized understanding;Returned demonstration       PT Short Term Goals - 08/17/19 1107      PT SHORT TERM GOAL #1   Title  Patient to be independent with initial HEP.    Time  3    Period  Weeks    Status  Achieved    Target Date  08/24/19        PT Long Term Goals - 08/17/19 1108      PT LONG TERM GOAL #1   Title  Patient to be independent with advanced HEP.    Time  6    Period  Weeks    Status  Partially Met   met for current     PT LONG TERM GOAL #2   Title  Patient to demonstrate [facility] and pain-free B shoulder and R elbow AROM.    Time  6     Period  Weeks    Status  On-going      PT LONG TERM GOAL #3   Title  Patient to demonstrate B UE strength >=4+/5 and symmetrical full handed & 3 point pinch grip strength.    Time  6    Period  Weeks    Status  On-going      PT LONG TERM GOAL #4   Title  Patient to report 80% improvement in ability to perform overhead lifting of >5lbs without pain.    Time  6    Period  Weeks    Status  On-going   avoiding lifting d/t pain     PT LONG TERM GOAL #5   Title  Patient to perform R UE reaching behind back to don/doff jacket with 0/10 pain.    Time  6    Period  Weeks    Status  On-going   reporting 2/10 pain while simulating this activity           Plan - 08/19/19 1104    Clinical Impression Statement  Patient reporting increased pain in R forearm for the past 2 days. Patient reporting that she may be pushing too hard with putty exercises. Reviewed these exercises for proper form and offered modifications in order to improve patient's tolerance. Allowed for wrist extensor stretching intermittently between gripping exercises as patient reporting intermittent lateral epicondyle pain. Worked on pec stretching and periscapular strengthening with light resistance to help correct rounded shoulder posturing. Patient was able to tolerate these activities well today. Ended session with moist heat to R elbow for pain relief as patient reporting poor tolerance for ice. No further complaints at end of session.    Comorbidities  MI 2019, DM, vascular dementia with behavior disturbance, CAD, GERD, HLD, hypokalemia, lumbosacral DDD, CKD III, HTN, hx of uterine CA    PT Treatment/Interventions  ADLs/Self Care Home Management;Cryotherapy;Electrical Stimulation;Iontophoresis 63m/ml Dexamethasone;Moist Heat;Balance training;Therapeutic exercise;Therapeutic activities;Functional mobility training;Ultrasound;Neuromuscular re-education;Stair training;Gait training;Cognitive remediation;Patient/family  education;Manual techniques;Vasopneumatic Device;Taping;Energy conservation;Dry needling;Passive range of motion;Canalith Repostioning    PT Next Visit Plan  gentle wrist extensor stretching and strengthening, gentle grip strengthening to tolerance, periscapular strengthening    Consulted and Agree with Plan of Care  Patient       Patient will benefit from skilled therapeutic intervention in order to improve the following deficits and impairments:  Increased edema, Decreased activity tolerance, Decreased strength, Increased fascial restricitons, Impaired UE functional use, Pain, Increased muscle spasms, Improper body mechanics, Decreased range of motion, Impaired flexibility, Postural dysfunction  Visit Diagnosis: Chronic right shoulder pain  Chronic left shoulder pain  Stiffness of right shoulder, not elsewhere classified  Stiffness of left shoulder, not elsewhere classified  Pain in right elbow  Abnormal posture     Problem List Patient Active Problem List   Diagnosis Date Noted  . Right arm pain 07/15/2019  . Hair loss 08/10/2018  . Hearing loss 08/10/2018  . Otitis externa 08/10/2018  . Varicose veins of right lower extremity with complications 81/27/5170  . Vascular dementia with behavior disturbance ([place]) 02/24/2018  . Myalgia 02/09/2018  . Seasonal allergic rhinitis due to pollen 01/21/2018  . Cough 12/23/2017  . Allergic urticaria 12/23/2017  . History of myocardial infarction in last year 12/23/2017  . Diabetes mellitus ([place]) 12/23/2017  . Rash and nonspecific skin eruption 12/23/2017  . Intrinsic atopic dermatitis 12/23/2017  . CAD (coronary artery disease) 09/25/2017  . Low back pain 09/25/2017  . Gastroesophageal reflux disease with esophagitis 09/04/2017  . Hyperlipidemia associated with type 2 diabetes mellitus ([place]) 12/31/2016  . Hypokalemia 10/28/2015  . DDD (degenerative disc disease), lumbosacral 09/13/2015  . Diverticulosis of large intestine  09/13/2015  . Seborrheic dermatitis of scalp 09/13/2015  . Chronic kidney disease, stage III (moderate) 05/02/2015  . Allergic rhinitis 07/14/2014  . Essential hypertension 07/14/2014  . History of uterine cancer 07/14/2014     Janene Harvey, PT, DPT 08/19/19 11:52 AM   [facility] [address]  [place], [place], [address] Phone: [phone]   Fax:  [phone]  Name: Kristina Lee MRN: [id number] Date of Birth: [birth year]

## 2019-08-20 ENCOUNTER — Encounter: Payer: Self-pay | Admitting: Specialist

## 2019-08-20 ENCOUNTER — Ambulatory Visit (INDEPENDENT_AMBULATORY_CARE_PROVIDER_SITE_OTHER): Payer: Medicare PPO | Admitting: Specialist

## 2019-08-20 ENCOUNTER — Encounter: Payer: Medicare PPO | Admitting: Physical Therapy

## 2019-08-20 VITALS — BP 160/70 | HR 59 | Ht 61.5 in | Wt 131.0 lb

## 2019-08-20 DIAGNOSIS — M7711 Lateral epicondylitis, right elbow: Secondary | ICD-10-CM

## 2019-08-20 DIAGNOSIS — R202 Paresthesia of skin: Secondary | ICD-10-CM

## 2019-08-20 DIAGNOSIS — G5601 Carpal tunnel syndrome, right upper limb: Secondary | ICD-10-CM | POA: Diagnosis not present

## 2019-08-20 DIAGNOSIS — M542 Cervicalgia: Secondary | ICD-10-CM

## 2019-08-20 DIAGNOSIS — R200 Anesthesia of skin: Secondary | ICD-10-CM

## 2019-08-20 NOTE — Progress Notes (Signed)
Office Visit Note   Patient: Kristina Lee           Date of Birth: 03/21/1946           MRN: [id number] Visit Date: 08/20/2019              Requested by: Bradd Canary, MD [address] [place],  [place] [address] PCP: Bradd Canary, MD   Assessment & Plan: Visit Diagnoses:  1. Carpal tunnel syndrome, right upper limb   2. Lateral epicondylitis, right elbow   3. Numbness and tingling in both hands   4. Cervicalgia     Plan: Carpal tunnel syndrome is a condition that causes pain in your hand and arm. The carpal tunnel is a narrow area located on the palm side of your wrist. Repeated wrist motion or certain diseases may cause swelling within the tunnel. This swelling pinches the main nerve in the wrist (median nerve). What are the causes? This condition may be caused by:  Repeated wrist motions.  Wrist injuries.  Arthritis.  A cyst or tumor in the carpal tunnel.  Fluid buildup during pregnancy. Sometimes the cause of this condition is not known. What increases the risk? This condition is more likely to develop in:  People who have jobs that cause them to repeatedly move their wrists in the same motion, such as Health visitor.  Women.  People with certain conditions, such as: ? Diabetes. ? Obesity. ? An underactive thyroid (hypothyroidism). ? Kidney failure. What are the signs or symptoms? Symptoms of this condition include:  A tingling feeling in your fingers, especially in your thumb, index, and middle fingers.  Tingling or numbness in your hand.  An aching feeling in your entire arm, especially when your wrist and elbow are bent for long periods of time.  Wrist pain that goes up your arm to your shoulder.  Pain that goes down into your palm or fingers.  A weak feeling in your hands. You may have trouble grabbing and holding items. Your symptoms may feel worse during the night. How is this diagnosed? This condition is  diagnosed with a medical history and physical exam. You may also have tests, including:  An electromyogram (EMG). This test measures electrical signals sent by your nerves into the muscles.  X-rays. How is this treated? Treatment for this condition includes:  Lifestyle changes. It is important to stop doing or modify the activity that caused your condition.  Physical or occupational therapy.  Medicines for pain and inflammation. This may include medicine that is injected into your wrist.  A wrist splint.  Surgery. Follow these instructions at home: If you have a splint:   Wear it as told by your health care provider. Remove it only as told by your health care provider.  Loosen the splint if your fingers become numb and tingle, or if they turn cold and blue.  Keep the splint clean and dry. General instructions   Take over-the-counter and prescription medicines only as told by your health care provider.  Rest your wrist from any activity that may be causing your pain. If your condition is work related, talk to your employer about changes that can be made, such as getting a wrist pad to use while typing.  If directed, apply ice to the painful area: ? Put ice in a plastic bag. ? Place a towel between your skin and the bag. ? Leave the ice on for 20 minutes,  2-3 times per day.  Keep all follow-up visits as told by your health care provider. This is important.  Do any exercises as told by your health care provider, physical therapist, or occupational therapist. Contact a health care provider if:  You have new symptoms.  Your pain is not controlled with medicines.  Your symptoms get worse. This information is not intended to replace advice given to you by your health care provider. Make sure you discuss any questions you have with your health care provider. Document Released: 03/30/2000 Document Revised: 08/11/2015 Document Reviewed: 12/12/2016 Elsevier Interactive Patient  Education  2017 [employer] Right elbow PT for lateral epicondylitis treatment including iontophoresis and stretching exercises.  Cervical spondylosis with ROM and McKenzie exercises.   Follow-Up Instructions: Return in about 4 weeks (around 09/17/2019).   Orders:  No orders of the defined types were placed in this encounter.  No orders of the defined types were placed in this encounter.     Procedures: No procedures performed   Clinical Data: Findings:  CLINICAL DATA:  Radial sided elbow pain.  EXAM: MRI OF THE RIGHT ELBOW WITHOUT CONTRAST  TECHNIQUE: Multiplanar, multisequence MR imaging of the elbow was performed. No intravenous contrast was administered.  COMPARISON:  05/21/2019  FINDINGS: TENDONS  Common forearm flexor origin: Intact. No tendinopathy or tear.  Common forearm extensor origin: Tendinopathy with partial-thickness tearing of the deep attachment fibers.  Biceps: Intact. The brachialis tendon is intact.  Triceps: Intact.  LIGAMENTS  Medial stabilizers: Intact  Lateral stabilizers:  Intact  Cartilage: Mild degenerative chondrosis but no cartilage defects or osteochondral lesion.  Joint: Mild degenerative changes but no joint effusion or loose bodies.  Cubital tunnel: Normal appearance of the ulnar nerve.  Bones: No acute bony findings.  IMPRESSION: 1. Tendinopathy with partial-thickness tearing of the deep attachment fibers of the common extensor tendon. 2. Intact radial and ulnar collateral ligaments. 3. Mild elbow joint degenerative changes but no joint effusion or loose bodies.   Electronically Signed   By: Marijo Sanes M.D.   On: 07/18/2019 15:17  Result History  MR Elbow Right w/o contrast (Order #[id number]) on 07/18/2019 - Order Result History Rep    Procedure Orders  NCV with EMG (electromyography) ([id number]) ordered by Magnus Sinning, MD at 08/07/19 0906     Pre-procedure Diagnoses   Paresthesia of skin (R20.2)           Signed                    Expand widget buttonCollapse widget button    Show:Clear all   ManualTemplateCopied  Added by:     Magnus Sinning, MD   Hover for detailscustomization button                EMG & NCV Findings:  Evaluation of the right median motor nerve showed decreased conduction velocity (Elbow-Wrist, 46 m/s).  The left median (across palm) sensory and the right median (across palm) sensory nerves showed prolonged distal peak latency (Wrist, L4.6, R4.6 ms) and prolonged distal peak latency (Palm, L2.6, R2.6 ms).  The left ulnar sensory and the right ulnar sensory nerves showed prolonged distal peak latency (L4.2, R4.0 ms) and decreased conduction velocity (Wrist-5th Digit, L33, R35 m/s).  All remaining nerves (as indicated in the following tables) were within normal limits.  All left vs. right side differences were within normal limits.       All examined muscles (as indicated in the following table) showed no  evidence of electrical instability.       Impression:  The above electrodiagnostic study is ABNORMAL and reveals evidence of:     1.  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      2.  A mild left median nerve entrapment at the wrist (carpal tunnel syndrome) affecting sensory components.      *There is likely some component of temperature artifact.     There is no significant electrodiagnostic evidence of any other focal nerve entrapment, brachial plexopathy or cervical radiculopathy.         Recommendations:  1.  Follow-up with referring physician.  2.  Continue current management of symptoms.  3.  Continue use of resting splint at night-time and as needed during the day.     ___________________________  Elease Hashimoto  Board Certified, American Board of Physical  Medicine and Rehabilitation          Nerve Conduction Studies  Anti Sensory Summary Table       Stim Site   NR   Peak (ms)   Norm Peak (ms)   P-T Amp (V)   Norm P-T Amp   Site1   Site2   Delta-P (ms)   Dist (cm)   Vel (m/s)   Norm Vel (m/s)    Left Median Acr Palm Anti Sensory (2nd Digit)  29.1C    Wrist        *4.6   <3.6   25.6   >10   Wrist   Palm   2.0   0.0            Palm        *2.6   <2.0   17.9                                Right Median Acr Palm Anti Sensory (2nd Digit)  28.8C    Wrist        *4.6   <3.6   29.8   >10   Wrist   Palm   2.0   0.0            Palm        *2.6   <2.0   32.8                                Left Radial Anti Sensory (Base 1st Digit)  28.8C    Wrist        2.3   <3.1   14.7       Wrist   Base 1st Digit   2.3   0.0            Right Radial Anti Sensory (Base 1st Digit)  28.4C    Wrist        2.4   <3.1   20.9       Wrist   Base 1st Digit   2.4   0.0            Left Ulnar Anti Sensory (5th Digit)  29.2C    Wrist        *4.2   <3.7   17.6   >15.0   Wrist   5th Digit   4.2   14.0   *33   >38    Right  Ulnar Anti Sensory (5th Digit)  28.7C    Wrist        *4.0   <3.7   21.6   >15.0   Wrist   5th Digit   4.0   14.0   *35   >38       Motor Summary Table       Stim Site   NR   Onset (ms)   Norm Onset (ms)   O-P Amp (mV)   Norm O-P Amp   Site1   Site2   Delta-0 (ms)   Dist (cm)   Vel (m/s)   Norm Vel (m/s)    Left Median Motor (Abd Poll Brev)  28.9C    Wrist        3.8   <4.2   9.5   >5   Elbow   Wrist   3.5   18.0   51   >50    Elbow        7.3       4.7                                Right Median Motor (Abd Poll Brev)  28.3C    Wrist         3.8   <4.2   9.9   >5   Elbow   Wrist   3.9   18.0   *46   >50    Elbow        7.7       4.1                                Left Ulnar Motor (Abd Dig Min)  29C    Wrist        3.5   <4.2   6.9   >3   B Elbow   Wrist   3.1   17.5   56   >53    B Elbow        6.6       7.4       A Elbow   B Elbow   1.9   10.0   53   >53    A Elbow        8.5       6.7                                Right Ulnar Motor (Abd Dig Min)  28.3C    Wrist        3.4   <4.2   9.1   >3   B Elbow   Wrist   3.2   17.0   53   >53    B Elbow        6.6       8.6       A Elbow   B Elbow   1.4   9.0   64   >53    A Elbow        8.0       5.7                                   EMG  Side   Muscle   Nerve   Root   Ins Act   Fibs   Psw   Amp   Dur   Poly   Recrt   Int Dennie Bible   Comment    Right   Abd Poll Brev   Median   C8-T1   Nml   Nml   Nml   Nml   Nml   0   Nml   Nml        Right   1stDorInt   Ulnar   C8-T1   Nml   Nml   Nml   Nml   Nml   0   Nml   Nml        Right   PronatorTeres   Median   C6-7   Nml   Nml   Nml   Nml   Nml   0   Nml   Nml        Right   Biceps   Musculocut   C5-6   Nml   Nml   Nml   Nml   Nml   0   Nml   Nml        Right   Deltoid   Axillary   C5-6   Nml   Nml   Nml   Nml   Nml   0   Nml   Nml              Nerve Conduction Studies  Anti Sensory Left/Right Comparison       Stim Site   L Lat (ms)   R Lat (ms)   L-R Lat (ms)   L Amp (V)   R Amp (V)   L-R Amp (%)   Site1   Site2   L Vel (m/s)   R Vel (m/s)   L-R Vel (m/s)    Median Acr Palm Anti Sensory (2nd Digit)  29.1C    Wrist   *4.6   *4.6   0.0   25.6   29.8    14.1   Wrist   Palm                Palm   *2.6   *2.6   0.0   17.9   32.8   45.4                        Radial Anti Sensory (Base 1st Digit)  28.8C    Wrist   2.3   2.4   0.1   14.7   20.9   29.7   Wrist   Base 1st Digit                Ulnar Anti Sensory (5th Digit)  29.2C    Wrist   *4.2   *4.0   0.2   17.6   21.6   18.5   Wrist   5th Digit   *33   *35   2       Motor Left/Right Comparison       Stim Site   L Lat (ms)   R Lat (ms)   L-R Lat (ms)   L Amp (mV)   R Amp (mV)   L-R Amp (%)   Site1   Site2   L Vel (m/s)   R Vel (m/s)   L-R Vel (m/s)    Median Motor (Abd Poll Brev)  28.9C    Wrist  3.8   3.8   0.0   9.5   9.9   4.0   Elbow   Wrist   51   *46   5    Elbow   7.3   7.7   0.4   4.7   4.1   12.8                        Ulnar Motor (Abd Dig Min)  29C    Wrist   3.5   3.4   0.1   6.9   9.1   24.2   B Elbow   Wrist   56   53   3    B Elbow   6.6   6.6   0.0   7.4   8.6   14.0   A Elbow   B Elbow   53   64   11    A Elbow   8.5   8.0   0.5   6.7   5.7   14.9                                Waveforms:  untitled image untitled image untitled image      untitled image untitled image untitled image      untitled image untitled image untitled image      untitled image              Electronically signed by Tyrell Antonio, MD at 08/11/2019  5:24 AM             Procedure visit on 08/07/2019               Detailed Report             Note shared with patient      Subjective: Chief Complaint  Patient presents with  . Neck - Pain    74 year old female with presistent pain into the right arm at the right elbow and right dorsal radial wrist and has pain into the right lateral distal arm above the  elbow. Icing the are made the pain worse. She report the pain is up and done the right forearm and shoulder.  There is also pain in the right lateral elbow with MRI showing lateral epicondylitis changes.    Review of Systems  Constitutional: Negative.   HENT: Negative.   Eyes: Negative.   Respiratory: Negative.   Cardiovascular: Negative.   Gastrointestinal: Negative.   Endocrine: Negative.   Genitourinary: Negative.   Allergic/Immunologic: Negative.   Neurological: Negative.   Hematological: Negative.   Psychiatric/Behavioral: Negative.      Objective: Vital Signs: BP (!) 160/70 (BP Location: Left Arm, Patient Position: Sitting)   Pulse (!) 59   Ht 5' 1.5" (1.562 m)   Wt 131 lb (59.4 kg)   BMI 24.35 kg/m   Physical Exam Constitutional:      Appearance: She is well-developed.  HENT:     Head: Normocephalic and atraumatic.  Eyes:     Pupils: Pupils are equal, round, and reactive to light.  Pulmonary:     Effort: Pulmonary effort is normal.     Breath sounds: Normal breath sounds.  Abdominal:     General: Bowel sounds are normal.     Palpations: Abdomen is soft.  Musculoskeletal:        General: Normal range of motion.  Cervical back: Normal range of motion and neck supple.  Skin:    General: Skin is warm and dry.  Neurological:     Mental Status: She is alert and oriented to person, place, and time.  Psychiatric:        Behavior: Behavior normal.        Thought Content: Thought content normal.        Judgment: Judgment normal.     Right Elbow Exam   Tenderness  The patient is experiencing tenderness in the lateral epicondyle and radial head.   Comments:  Tender right lateral epicondyle and just distal to the common extensor tendon attachment.      Specialty Comments:  No specialty comments available.  Imaging: No results found.   PMFS History: Patient Active Problem List   Diagnosis Date Noted  . Right arm pain 07/15/2019  . Hair loss  08/10/2018  . Hearing loss 08/10/2018  . Otitis externa 08/10/2018  . Varicose veins of right lower extremity with complications 05/08/2018  . Vascular dementia with behavior disturbance (HCC) 02/24/2018  . Myalgia 02/09/2018  . Seasonal allergic rhinitis due to pollen 01/21/2018  . Cough 12/23/2017  . Allergic urticaria 12/23/2017  . History of myocardial infarction in last year 12/23/2017  . Diabetes mellitus (HCC) 12/23/2017  . Rash and nonspecific skin eruption 12/23/2017  . Intrinsic atopic dermatitis 12/23/2017  . CAD (coronary artery disease) 09/25/2017  . Low back pain 09/25/2017  . Gastroesophageal reflux disease with esophagitis 09/04/2017  . Hyperlipidemia associated with type 2 diabetes mellitus (HCC) 12/31/2016  . Hypokalemia 10/28/2015  . DDD (degenerative disc disease), lumbosacral 09/13/2015  . Diverticulosis of large intestine 09/13/2015  . Seborrheic dermatitis of scalp 09/13/2015  . Chronic kidney disease, stage III (moderate) 05/02/2015  . Allergic rhinitis 07/14/2014  . Essential hypertension 07/14/2014  . History of uterine cancer 07/14/2014   Past Medical History:  Diagnosis Date  . Diabetes mellitus without complication (HCC)   . Heart attack (HCC) 09/07/2017  . Urticaria     Family History  Problem Relation Age of Onset  . Cancer Mother        liver cancer, breast cancer x 2   . Alcohol abuse Mother        smoker  . Heart disease Father        MI  . Alcohol abuse Father        smoker  . Depression Sister   . Tremor Sister   . Cancer Brother        pancreatic  . Healthy Son   . Diabetes Maternal Grandmother   . Hearing loss Maternal Grandmother   . Depression Maternal Grandmother   . Allergic rhinitis Neg Hx   . Angioedema Neg Hx   . Asthma Neg Hx   . Eczema Neg Hx   . Immunodeficiency Neg Hx   . Urticaria Neg Hx     Past Surgical History:  Procedure Laterality Date  . ABDOMINAL HYSTERECTOMY  2006   b/l SPO and TAH, uterine cancer   . EYE SURGERY Right 1960  . TUBAL LIGATION     Social History   Occupational History  . Not on file  Tobacco Use  . Smoking status: Former Smoker    Packs/day: 0.50    Years: 24.00    Pack years: 12.00    Types: Cigarettes    Quit date: 04/17/1987    Years since quitting: 32.3  . Smokeless tobacco: Never Used  Substance and Sexual  Activity  . Alcohol use: Yes    Comment: occ  . Drug use: Not Currently  . Sexual activity: Yes

## 2019-08-20 NOTE — Patient Instructions (Addendum)
Carpal Tunnel Syndrome  Carpal tunnel syndrome is a condition that causes pain in your hand and arm. The carpal tunnel is a narrow area located on the palm side of your wrist. Repeated wrist motion or certain diseases may cause swelling within the tunnel. This swelling pinches the main nerve in the wrist (median nerve). What are the causes? This condition may be caused by:  Repeated wrist motions.  Wrist injuries.  Arthritis.  A cyst or tumor in the carpal tunnel.  Fluid buildup during pregnancy. Sometimes the cause of this condition is not known. What increases the risk? This condition is more likely to develop in:  People who have jobs that cause them to repeatedly move their wrists in the same motion, such as Art gallery manager.  Women.  People with certain conditions, such as: ? Diabetes. ? Obesity. ? An underactive thyroid (hypothyroidism). ? Kidney failure. What are the signs or symptoms? Symptoms of this condition include:  A tingling feeling in your fingers, especially in your thumb, index, and middle fingers.  Tingling or numbness in your hand.  An aching feeling in your entire arm, especially when your wrist and elbow are bent for long periods of time.  Wrist pain that goes up your arm to your shoulder.  Pain that goes down into your palm or fingers.  A weak feeling in your hands. You may have trouble grabbing and holding items. Your symptoms may feel worse during the night. How is this diagnosed? This condition is diagnosed with a medical history and physical exam. You may also have tests, including:  An electromyogram (EMG). This test measures electrical signals sent by your nerves into the muscles.  X-rays. How is this treated? Treatment for this condition includes:  Lifestyle changes. It is important to stop doing or modify the activity that caused your condition.  Physical or occupational therapy.  Medicines for pain and inflammation. This may  include medicine that is injected into your wrist.  A wrist splint.  Surgery. Follow these instructions at home: If you have a splint:   Wear it as told by your health care provider. Remove it only as told by your health care provider.  Loosen the splint if your fingers become numb and tingle, or if they turn cold and blue.  Keep the splint clean and dry. General instructions   Take over-the-counter and prescription medicines only as told by your health care provider.  Rest your wrist from any activity that may be causing your pain. If your condition is work related, talk to your employer about changes that can be made, such as getting a wrist pad to use while typing.  If directed, apply ice to the painful area: ? Put ice in a plastic bag. ? Place a towel between your skin and the bag. ? Leave the ice on for 20 minutes, 2-3 times per day.  Keep all follow-up visits as told by your health care provider. This is important.  Do any exercises as told by your health care provider, physical therapist, or occupational therapist. Contact a health care provider if:  You have new symptoms.  Your pain is not controlled with medicines.  Your symptoms get worse. This information is not intended to replace advice given to you by your health care provider. Make sure you discuss any questions you have with your health care provider. Document Released: 03/30/2000 Document Revised: 08/11/2015 Document Reviewed: 12/12/2016 Elsevier Interactive Patient Education  2017 [employer]. Right elbow PT for lateral  epicondylitis treatment including iontophoresis and stretching exercises.  Cervical spondylosis with ROM and McKenzie exercises.  Use wrist splints full time except bathing and when you are wanting to take a break for 2 weeks then use at night for 2 weeks then discontinue.

## 2019-08-24 ENCOUNTER — Ambulatory Visit: Payer: Medicare PPO | Admitting: Rheumatology

## 2019-08-24 ENCOUNTER — Other Ambulatory Visit: Payer: Self-pay

## 2019-08-24 ENCOUNTER — Ambulatory Visit: Payer: Medicare PPO

## 2019-08-24 DIAGNOSIS — G8929 Other chronic pain: Secondary | ICD-10-CM

## 2019-08-24 DIAGNOSIS — M25511 Pain in right shoulder: Secondary | ICD-10-CM | POA: Diagnosis not present

## 2019-08-24 DIAGNOSIS — M25611 Stiffness of right shoulder, not elsewhere classified: Secondary | ICD-10-CM | POA: Diagnosis not present

## 2019-08-24 DIAGNOSIS — R42 Dizziness and giddiness: Secondary | ICD-10-CM | POA: Diagnosis not present

## 2019-08-24 DIAGNOSIS — M25612 Stiffness of left shoulder, not elsewhere classified: Secondary | ICD-10-CM

## 2019-08-24 DIAGNOSIS — R293 Abnormal posture: Secondary | ICD-10-CM

## 2019-08-24 DIAGNOSIS — M25521 Pain in right elbow: Secondary | ICD-10-CM | POA: Diagnosis not present

## 2019-08-24 DIAGNOSIS — M25512 Pain in left shoulder: Secondary | ICD-10-CM

## 2019-08-24 NOTE — Therapy (Signed)
[facility] [address]  [place], [place], [address] Phone: [phone]   Fax:  [phone]  Physical Therapy Treatment  Patient Details  Name: Kristina Lee MRN: [id number] Date of Birth: 04-14-1946 Referring Provider (PT): Basil Dess, MD   Encounter Date: 08/24/2019  PT End of Session - 08/24/19 1128    Visit Number  6    Number of Visits  13    Date for PT Re-Evaluation  09/14/19    Authorization Type  [employer]    PT Start Time  1115    PT Stop Time  1153    PT Time Calculation (min)  38 min    Activity Tolerance  Patient tolerated treatment well;Patient limited by pain    Behavior During Therapy  [facility] for tasks assessed/performed       Past Medical History:  Diagnosis Date  . Diabetes mellitus without complication ([place])   . Heart attack ([place]) 09/07/2017  . Urticaria     Past Surgical History:  Procedure Laterality Date  . ABDOMINAL HYSTERECTOMY  2006   b/l SPO and TAH, uterine cancer  . EYE SURGERY Right 1960  . TUBAL LIGATION      There were no vitals filed for this visit.  Subjective Assessment - 08/24/19 1118    Subjective  Pt. doing ok.    Pertinent History  MI 2019, DM, vascular dementia with behavior disturbance, CAD, GERD, HLD, hypokalemia, lumbosacral DDD, CKD III, HTN, hx of uterine CA    Diagnostic tests  07/18/19 R elbow MRI: Tendinopathy with partial-thickness tearing of the deep attachment fibers of the common extensor tendon. Intact radial and ulnar collateral ligaments. Mild elbow joint degenerative changes    Patient Stated Goals  get rid of pain    Currently in Pain?  Yes    Pain Score  1     Pain Location  Shoulder    Pain Orientation  Left    Pain Descriptors / Indicators  Aching    Pain Type  Acute pain    Multiple Pain Sites  Yes    Pain Location  Elbow    Pain Orientation  Right                       [facility] Adult PT Treatment/Exercise -  08/24/19 0001      Shoulder Exercises: Seated   Extension  Both;10 reps;Theraband;Strengthening    Theraband Level (Shoulder Extension)  Level 1 (Yellow)    Extension Limitations  heavy cueing for scapular retraction     Retraction  Both;10 reps    Retraction Limitations  5" hold    Improved ROM with cueing to "puff chest out"   Row  Both;12 reps;Theraband;Strengthening    Theraband Level (Shoulder Row)  Level 1 (Yellow)    Row Limitations  cues for scapular retraction       Shoulder Exercises: Pulleys   Flexion  3 minutes    Flexion Limitations  to tolerance    Scaption  3 minutes    Scaption Limitations  to tolerance      Shoulder Exercises: Stretch   Corner Stretch  2 reps;30 seconds    Corner Stretch Limitations  in corner of room       Wrist Exercises   Other wrist exercises  R wrist flexion, extension stretch x 20 sec each       Manual Therapy   Manual Therapy  Soft tissue  mobilization    Manual therapy comments  seated    Soft tissue mobilization  STM to B upper shoulders focusing on UT             PT Education - 08/24/19 1259    Education Details  HEP update;  corner chest stretch, yellow TB row    Person(s) Educated  Patient    Methods  Explanation;Demonstration;Verbal cues;Handout    Comprehension  Verbalized understanding;Returned demonstration;Verbal cues required       PT Short Term Goals - 08/17/19 1107      PT SHORT TERM GOAL #1   Title  Patient to be independent with initial HEP.    Time  3    Period  Weeks    Status  Achieved    Target Date  08/24/19        PT Long Term Goals - 08/17/19 1108      PT LONG TERM GOAL #1   Title  Patient to be independent with advanced HEP.    Time  6    Period  Weeks    Status  Partially Met   met for current     PT LONG TERM GOAL #2   Title  Patient to demonstrate [facility] and pain-free B shoulder and R elbow AROM.    Time  6    Period  Weeks    Status  On-going      PT LONG TERM GOAL #3   Title   Patient to demonstrate B UE strength >=4+/5 and symmetrical full handed & 3 point pinch grip strength.    Time  6    Period  Weeks    Status  On-going      PT LONG TERM GOAL #4   Title  Patient to report 80% improvement in ability to perform overhead lifting of >5lbs without pain.    Time  6    Period  Weeks    Status  On-going   avoiding lifting d/t pain     PT LONG TERM GOAL #5   Title  Patient to perform R UE reaching behind back to don/doff jacket with 0/10 pain.    Time  6    Period  Weeks    Status  On-going   reporting 2/10 pain while simulating this activity           Plan - 08/24/19 1131    Clinical Impression Statement  Kristina Lee reporting some improvement in R elbow/UE functional over this past week.  Saw MD for f/u and received a R wrist brace.  Has been wearing wrist brace daily.  Session focused on postural flexibility activities (corner chest stretch) and scapular strengthening which was tolerated without additional pain thus HEP updated (see pt. education section).  Kristina Lee with intermittent complaint of shoulder and elbow pain which did not seem to limit therex.  Performed gentle stretching to R wrist to improve mobility.    Comorbidities  MI 2019, DM, vascular dementia with behavior disturbance, CAD, GERD, HLD, hypokalemia, lumbosacral DDD, CKD III, HTN, hx of uterine CA    Rehab Potential  Good    PT Frequency  2x / week    PT Treatment/Interventions  ADLs/Self Care Home Management;Cryotherapy;Electrical Stimulation;Iontophoresis 57m/ml Dexamethasone;Moist Heat;Balance training;Therapeutic exercise;Therapeutic activities;Functional mobility training;Ultrasound;Neuromuscular re-education;Stair training;Gait training;Cognitive remediation;Patient/family education;Manual techniques;Vasopneumatic Device;Taping;Energy conservation;Dry needling;Passive range of motion;Canalith Repostioning    PT Next Visit Plan  gentle wrist extensor stretching and strengthening, gentle grip  strengthening to tolerance, periscapular strengthening  Consulted and Agree with Plan of Care  Patient       Patient will benefit from skilled therapeutic intervention in order to improve the following deficits and impairments:  Increased edema, Decreased activity tolerance, Decreased strength, Increased fascial restricitons, Impaired UE functional use, Pain, Increased muscle spasms, Improper body mechanics, Decreased range of motion, Impaired flexibility, Postural dysfunction  Visit Diagnosis: Chronic right shoulder pain  Chronic left shoulder pain  Stiffness of right shoulder, not elsewhere classified  Stiffness of left shoulder, not elsewhere classified  Pain in right elbow  Abnormal posture     Problem List Patient Active Problem List   Diagnosis Date Noted  . Right arm pain 07/15/2019  . Hair loss 08/10/2018  . Hearing loss 08/10/2018  . Otitis externa 08/10/2018  . Varicose veins of right lower extremity with complications 38/88/7579  . Vascular dementia with behavior disturbance ([place]) 02/24/2018  . Myalgia 02/09/2018  . Seasonal allergic rhinitis due to pollen 01/21/2018  . Cough 12/23/2017  . Allergic urticaria 12/23/2017  . History of myocardial infarction in last year 12/23/2017  . Diabetes mellitus ([place]) 12/23/2017  . Rash and nonspecific skin eruption 12/23/2017  . Intrinsic atopic dermatitis 12/23/2017  . CAD (coronary artery disease) 09/25/2017  . Low back pain 09/25/2017  . Gastroesophageal reflux disease with esophagitis 09/04/2017  . Hyperlipidemia associated with type 2 diabetes mellitus ([place]) 12/31/2016  . Hypokalemia 10/28/2015  . DDD (degenerative disc disease), lumbosacral 09/13/2015  . Diverticulosis of large intestine 09/13/2015  . Seborrheic dermatitis of scalp 09/13/2015  . Chronic kidney disease, stage III (moderate) 05/02/2015  . Allergic rhinitis 07/14/2014  . Essential hypertension 07/14/2014  . History of uterine cancer 07/14/2014     Kristina Lee, Kristina Lee 08/24/19 1:02 PM    [facility] [address]  [place], [place], [address] Phone: [phone]   Fax:  [phone]  Name: Kristina Lee MRN: [id number] Date of Birth: September 21, 1945

## 2019-08-27 ENCOUNTER — Encounter: Payer: Self-pay | Admitting: Physical Therapy

## 2019-08-27 ENCOUNTER — Other Ambulatory Visit: Payer: Self-pay

## 2019-08-27 ENCOUNTER — Ambulatory Visit: Payer: Medicare PPO | Admitting: Physical Therapy

## 2019-08-27 DIAGNOSIS — R293 Abnormal posture: Secondary | ICD-10-CM | POA: Diagnosis not present

## 2019-08-27 DIAGNOSIS — R42 Dizziness and giddiness: Secondary | ICD-10-CM | POA: Diagnosis not present

## 2019-08-27 DIAGNOSIS — M25511 Pain in right shoulder: Secondary | ICD-10-CM | POA: Diagnosis not present

## 2019-08-27 DIAGNOSIS — G8929 Other chronic pain: Secondary | ICD-10-CM

## 2019-08-27 DIAGNOSIS — M25521 Pain in right elbow: Secondary | ICD-10-CM | POA: Diagnosis not present

## 2019-08-27 DIAGNOSIS — M25612 Stiffness of left shoulder, not elsewhere classified: Secondary | ICD-10-CM

## 2019-08-27 DIAGNOSIS — M25512 Pain in left shoulder: Secondary | ICD-10-CM

## 2019-08-27 DIAGNOSIS — M25611 Stiffness of right shoulder, not elsewhere classified: Secondary | ICD-10-CM | POA: Diagnosis not present

## 2019-08-27 NOTE — Therapy (Signed)
[facility] [address]  [place], [place], [address] Phone: [phone]   Fax:  [phone]  Physical Therapy Treatment  Patient Details  Name: Kristina Lee MRN: [id number] Date of Birth: Oct 09, 1945 Referring Provider (PT): Basil Dess, MD   Encounter Date: 08/27/2019  PT End of Session - 08/27/19 1215    Visit Number  7    Number of Visits  13    Date for PT Re-Evaluation  09/14/19    Authorization Type  [employer]    PT Start Time  7564   pt late   PT Stop Time  1059    PT Time Calculation (min)  38 min    Activity Tolerance  Patient tolerated treatment well;Patient limited by pain    Behavior During Therapy  [facility] for tasks assessed/performed       Past Medical History:  Diagnosis Date  . Diabetes mellitus without complication ([place])   . Heart attack ([place]) 09/07/2017  . Urticaria     Past Surgical History:  Procedure Laterality Date  . ABDOMINAL HYSTERECTOMY  2006   b/l SPO and TAH, uterine cancer  . EYE SURGERY Right 1960  . TUBAL LIGATION      There were no vitals filed for this visit.  Subjective Assessment - 08/27/19 1021    Subjective  Has been "as consistent as one can be" with her wrist splint. Feels that since coming to therapy, her "pain is much reduced."    Pertinent History  MI 2019, DM, vascular dementia with behavior disturbance, CAD, GERD, HLD, hypokalemia, lumbosacral DDD, CKD III, HTN, hx of uterine CA    Diagnostic tests  07/18/19 R elbow MRI: Tendinopathy with partial-thickness tearing of the deep attachment fibers of the common extensor tendon. Intact radial and ulnar collateral ligaments. Mild elbow joint degenerative changes    Patient Stated Goals  get rid of pain    Currently in Pain?  No/denies                        [facility] Adult PT Treatment/Exercise - 08/27/19 0001      Elbow Exercises   Elbow Flexion  Strengthening;Right;10  reps;Theraband;Standing    Theraband Level (Elbow Flexion)  Level 2 (Red)    Elbow Flexion Limitations  2x10; cues for elbow flexion rather than rowing motion   difficulty maintaining supination   Elbow Extension  Strengthening;Right;10 reps;Standing    Theraband Level (Elbow Extension)  Level 1 (Yellow)    Elbow Extension Limitations  2x10; manual cues to maintain elbow at side   unable to tolerate red TB     Shoulder Exercises: ROM/Strengthening   UBE (Upper Arm Bike)  L1 x 3 min forward & back      Shoulder Exercises: Stretch   Corner Stretch  2 reps;30 seconds    Corner Stretch Limitations  in corner of room    cues for correction of form   Other Shoulder Stretches  R biceps stretch against wall 2x30"      Wrist Exercises   Wrist Flexion  Strengthening;Right;10 reps;Seated;Bar weights/barbell    Bar Weights/Barbell (Wrist Flexion)  1 lb    Wrist Flexion Limitations  AROM wrist flexion and eccentric extension    Wrist Extension  Strengthening;Right;10 reps;Seated;Bar weights/barbell    Bar Weights/Barbell (Wrist Extension)  1 lb    Wrist Extension Limitations  wrist extension PROM, eccentric wrist flexion AROM 3"  PT Education - 08/27/19 1058    Education Details  update to HEP    Person(s) Educated  Patient    Methods  Explanation;Demonstration;Tactile cues;Verbal cues;Handout    Comprehension  Verbalized understanding;Returned demonstration       PT Short Term Goals - 08/17/19 1107      PT SHORT TERM GOAL #1   Title  Patient to be independent with initial HEP.    Time  3    Period  Weeks    Status  Achieved    Target Date  08/24/19        PT Long Term Goals - 08/17/19 1108      PT LONG TERM GOAL #1   Title  Patient to be independent with advanced HEP.    Time  6    Period  Weeks    Status  Partially Met   met for current     PT LONG TERM GOAL #2   Title  Patient to demonstrate [facility] and pain-free B shoulder and R elbow AROM.    Time  6     Period  Weeks    Status  On-going      PT LONG TERM GOAL #3   Title  Patient to demonstrate B UE strength >=4+/5 and symmetrical full handed & 3 point pinch grip strength.    Time  6    Period  Weeks    Status  On-going      PT LONG TERM GOAL #4   Title  Patient to report 80% improvement in ability to perform overhead lifting of >5lbs without pain.    Time  6    Period  Weeks    Status  On-going   avoiding lifting d/t pain     PT LONG TERM GOAL #5   Title  Patient to perform R UE reaching behind back to don/doff jacket with 0/10 pain.    Time  6    Period  Weeks    Status  On-going   reporting 2/10 pain while simulating this activity           Plan - 08/27/19 1215    Clinical Impression Statement  Patient reports trying to be consistent with wrist splint use. Notes good improvement in pain levels since starting therapy. Reviewed pec stretching for proper form and initiated gentle biceps stretch for hopeful improvement in elbow extension ROM. Worked on resisted elbow flexion with patient demonstrating weakness and shaking, and reporting muscle fatigue in biceps. Difficulty also reported with resisted elbow extension, but was better tolerated with yellow vs. red theraband. Patient denied pain with resisted wrist flexion and eccentric extension today, thus this was updated into HEP. Patient reported understanding and without complaints at end of session. Patient progressing well towards goals.    Comorbidities  MI 2019, DM, vascular dementia with behavior disturbance, CAD, GERD, HLD, hypokalemia, lumbosacral DDD, CKD III, HTN, hx of uterine CA    Rehab Potential  Good    PT Frequency  2x / week    PT Treatment/Interventions  ADLs/Self Care Home Management;Cryotherapy;Electrical Stimulation;Iontophoresis 49m/ml Dexamethasone;Moist Heat;Balance training;Therapeutic exercise;Therapeutic activities;Functional mobility training;Ultrasound;Neuromuscular re-education;Stair training;Gait  training;Cognitive remediation;Patient/family education;Manual techniques;Vasopneumatic Device;Taping;Energy conservation;Dry needling;Passive range of motion;Canalith Repostioning    PT Next Visit Plan  gentle wrist extensor stretching and strengthening, gentle grip strengthening to tolerance, periscapular strengthening    Consulted and Agree with Plan of Care  Patient       Patient will benefit from skilled therapeutic intervention in  order to improve the following deficits and impairments:  Increased edema, Decreased activity tolerance, Decreased strength, Increased fascial restricitons, Impaired UE functional use, Pain, Increased muscle spasms, Improper body mechanics, Decreased range of motion, Impaired flexibility, Postural dysfunction  Visit Diagnosis: Chronic right shoulder pain  Chronic left shoulder pain  Stiffness of right shoulder, not elsewhere classified  Stiffness of left shoulder, not elsewhere classified  Pain in right elbow  Abnormal posture  Dizziness and giddiness     Problem List Patient Active Problem List   Diagnosis Date Noted  . Right arm pain 07/15/2019  . Hair loss 08/10/2018  . Hearing loss 08/10/2018  . Otitis externa 08/10/2018  . Varicose veins of right lower extremity with complications 62/56/3893  . Vascular dementia with behavior disturbance ([place]) 02/24/2018  . Myalgia 02/09/2018  . Seasonal allergic rhinitis due to pollen 01/21/2018  . Cough 12/23/2017  . Allergic urticaria 12/23/2017  . History of myocardial infarction in last year 12/23/2017  . Diabetes mellitus ([place]) 12/23/2017  . Rash and nonspecific skin eruption 12/23/2017  . Intrinsic atopic dermatitis 12/23/2017  . CAD (coronary artery disease) 09/25/2017  . Low back pain 09/25/2017  . Gastroesophageal reflux disease with esophagitis 09/04/2017  . Hyperlipidemia associated with type 2 diabetes mellitus ([place]) 12/31/2016  . Hypokalemia 10/28/2015  . DDD (degenerative disc  disease), lumbosacral 09/13/2015  . Diverticulosis of large intestine 09/13/2015  . Seborrheic dermatitis of scalp 09/13/2015  . Chronic kidney disease, stage III (moderate) 05/02/2015  . Allergic rhinitis 07/14/2014  . Essential hypertension 07/14/2014  . History of uterine cancer 07/14/2014      Janene Harvey, PT, DPT 08/27/19 12:17 PM   [facility] [address]  [place], [place], [address] Phone: [phone]   Fax:  [phone]  Name: Kristina Lee MRN: [id number] Date of Birth: 1945/10/10

## 2019-08-31 ENCOUNTER — Other Ambulatory Visit: Payer: Self-pay

## 2019-08-31 ENCOUNTER — Ambulatory Visit: Payer: Medicare PPO

## 2019-08-31 DIAGNOSIS — M25611 Stiffness of right shoulder, not elsewhere classified: Secondary | ICD-10-CM

## 2019-08-31 DIAGNOSIS — M25512 Pain in left shoulder: Secondary | ICD-10-CM

## 2019-08-31 DIAGNOSIS — R293 Abnormal posture: Secondary | ICD-10-CM

## 2019-08-31 DIAGNOSIS — G8929 Other chronic pain: Secondary | ICD-10-CM

## 2019-08-31 DIAGNOSIS — M25612 Stiffness of left shoulder, not elsewhere classified: Secondary | ICD-10-CM

## 2019-08-31 DIAGNOSIS — M25511 Pain in right shoulder: Secondary | ICD-10-CM | POA: Diagnosis not present

## 2019-08-31 DIAGNOSIS — M25521 Pain in right elbow: Secondary | ICD-10-CM

## 2019-08-31 DIAGNOSIS — R42 Dizziness and giddiness: Secondary | ICD-10-CM

## 2019-08-31 NOTE — Therapy (Signed)
[facility] [address]  [place], [place], [address] Phone: [phone]   Fax:  [phone]  Physical Therapy Treatment  Patient Details  Name: Kristina Lee MRN: [id number] Date of Birth: 05-01-1945 Referring Provider (PT): Basil Dess, MD   Encounter Date: 08/31/2019  PT End of Session - 08/31/19 1112    Visit Number  8    Number of Visits  13    Date for PT Re-Evaluation  09/14/19    Authorization Type  [employer]    PT Start Time  1104    PT Stop Time  1144    PT Time Calculation (min)  40 min    Activity Tolerance  Patient tolerated treatment well;Patient limited by pain    Behavior During Therapy  [facility] for tasks assessed/performed       Past Medical History:  Diagnosis Date  . Diabetes mellitus without complication ([place])   . Heart attack ([place]) 09/07/2017  . Urticaria     Past Surgical History:  Procedure Laterality Date  . ABDOMINAL HYSTERECTOMY  2006   b/l SPO and TAH, uterine cancer  . EYE SURGERY Right 1960  . TUBAL LIGATION      There were no vitals filed for this visit.  Subjective Assessment - 08/31/19 1110    Subjective  Noting improved pain levels over this past weekend.    Pertinent History  MI 2019, DM, vascular dementia with behavior disturbance, CAD, GERD, HLD, hypokalemia, lumbosacral DDD, CKD III, HTN, hx of uterine CA    Diagnostic tests  07/18/19 R elbow MRI: Tendinopathy with partial-thickness tearing of the deep attachment fibers of the common extensor tendon. Intact radial and ulnar collateral ligaments. Mild elbow joint degenerative changes    Patient Stated Goals  get rid of pain    Currently in Pain?  No/denies    Pain Score  0-No pain    Pain Location  Shoulder    Pain Score  0   pain up to 6/10   Pain Location  Elbow    Pain Orientation  Lateral    Pain Descriptors / Indicators  Shooting    Pain Type  Acute pain                         [facility] Adult PT Treatment/Exercise - 08/31/19 0001      Elbow Exercises   Elbow Flexion  Right;15 reps;Seated;Bar weights/barbell    Bar Weights/Barbell (Elbow Flexion)  1 lb    Elbow Extension  Right;15 reps;Bar weights/barbell    Bar Weights/Barbell (Elbow Extension)  1 lb    Elbow Extension Limitations  prone    therapist anchoring elbow in position      Shoulder Exercises: Supine   Horizontal ABduction  Both;10 reps;Theraband;Strengthening    Theraband Level (Shoulder Horizontal ABduction)  Level 1 (Yellow)    Horizontal ABduction Limitations  Hooklying       Shoulder Exercises: Sidelying   External Rotation  Right;12 reps;AROM    External Rotation Weight (lbs)  1#      Shoulder Exercises: ROM/Strengthening   Nustep  Lvl 3, 6 min (UE/LE)      Wrist Exercises   Wrist Flexion  Right;15 reps;Bar weights/barbell;Seated;Strengthening    Bar Weights/Barbell (Wrist Flexion)  1 lb    Wrist Extension  Right;15 reps;Seated;Bar weights/barbell;Strengthening    Bar Weights/Barbell (Wrist Extension)  1 lb  PT Short Term Goals - 08/17/19 1107      PT SHORT TERM GOAL #1   Title  Patient to be independent with initial HEP.    Time  3    Period  Weeks    Status  Achieved    Target Date  08/24/19        PT Long Term Goals - 08/17/19 1108      PT LONG TERM GOAL #1   Title  Patient to be independent with advanced HEP.    Time  6    Period  Weeks    Status  Partially Met   met for current     PT LONG TERM GOAL #2   Title  Patient to demonstrate [facility] and pain-free B shoulder and R elbow AROM.    Time  6    Period  Weeks    Status  On-going      PT LONG TERM GOAL #3   Title  Patient to demonstrate B UE strength >=4+/5 and symmetrical full handed & 3 point pinch grip strength.    Time  6    Period  Weeks    Status  On-going      PT LONG TERM GOAL #4   Title  Patient to report 80% improvement in ability to perform  overhead lifting of >5lbs without pain.    Time  6    Period  Weeks    Status  On-going   avoiding lifting d/t pain     PT LONG TERM GOAL #5   Title  Patient to perform R UE reaching behind back to don/doff jacket with 0/10 pain.    Time  6    Period  Weeks    Status  On-going   reporting 2/10 pain while simulating this activity           Plan - 08/31/19 1113    Clinical Impression Statement  Kristina Lee tolerated all periscapular, elbow, and wrist strengthening today well without increased pain.  Does feel that her R elbow/wrist pain improved over weekend and reports she is no longer keeping grandchildren.  Progressed reps with wrist extension strengthening with cueing focused on slow eccentric loading.  Ended visit with pt. reporting she was pain free and "I could feel it working".    Comorbidities  MI 2019, DM, vascular dementia with behavior disturbance, CAD, GERD, HLD, hypokalemia, lumbosacral DDD, CKD III, HTN, hx of uterine CA    Rehab Potential  Good    PT Frequency  2x / week    PT Treatment/Interventions  ADLs/Self Care Home Management;Cryotherapy;Electrical Stimulation;Iontophoresis 71m/ml Dexamethasone;Moist Heat;Balance training;Therapeutic exercise;Therapeutic activities;Functional mobility training;Ultrasound;Neuromuscular re-education;Stair training;Gait training;Cognitive remediation;Patient/family education;Manual techniques;Vasopneumatic Device;Taping;Energy conservation;Dry needling;Passive range of motion;Canalith Repostioning    PT Next Visit Plan  gentle wrist extensor stretching and strengthening, gentle grip strengthening to tolerance, periscapular strengthening    Consulted and Agree with Plan of Care  Patient       Patient will benefit from skilled therapeutic intervention in order to improve the following deficits and impairments:  Increased edema, Decreased activity tolerance, Decreased strength, Increased fascial restricitons, Impaired UE functional use, Pain,  Increased muscle spasms, Improper body mechanics, Decreased range of motion, Impaired flexibility, Postural dysfunction  Visit Diagnosis: Chronic right shoulder pain  Chronic left shoulder pain  Stiffness of right shoulder, not elsewhere classified  Stiffness of left shoulder, not elsewhere classified  Pain in right elbow  Abnormal posture  Dizziness and giddiness     Problem  List Patient Active Problem List   Diagnosis Date Noted  . Right arm pain 07/15/2019  . Hair loss 08/10/2018  . Hearing loss 08/10/2018  . Otitis externa 08/10/2018  . Varicose veins of right lower extremity with complications 79/44/4619  . Vascular dementia with behavior disturbance ([place]) 02/24/2018  . Myalgia 02/09/2018  . Seasonal allergic rhinitis due to pollen 01/21/2018  . Cough 12/23/2017  . Allergic urticaria 12/23/2017  . History of myocardial infarction in last year 12/23/2017  . Diabetes mellitus ([place]) 12/23/2017  . Rash and nonspecific skin eruption 12/23/2017  . Intrinsic atopic dermatitis 12/23/2017  . CAD (coronary artery disease) 09/25/2017  . Low back pain 09/25/2017  . Gastroesophageal reflux disease with esophagitis 09/04/2017  . Hyperlipidemia associated with type 2 diabetes mellitus ([place]) 12/31/2016  . Hypokalemia 10/28/2015  . DDD (degenerative disc disease), lumbosacral 09/13/2015  . Diverticulosis of large intestine 09/13/2015  . Seborrheic dermatitis of scalp 09/13/2015  . Chronic kidney disease, stage III (moderate) 05/02/2015  . Allergic rhinitis 07/14/2014  . Essential hypertension 07/14/2014  . History of uterine cancer 07/14/2014    Bess Harvest, PTA 08/31/19 12:06 PM   [facility] [address]  [place], [place], [address] Phone: [phone]   Fax:  [phone]  Name: Kristina Lee MRN: [id number] Date of Birth: 1946/04/13

## 2019-09-03 ENCOUNTER — Encounter: Payer: Self-pay | Admitting: Physical Therapy

## 2019-09-03 ENCOUNTER — Other Ambulatory Visit: Payer: Self-pay

## 2019-09-03 ENCOUNTER — Ambulatory Visit: Payer: Medicare PPO | Admitting: Physical Therapy

## 2019-09-03 DIAGNOSIS — R293 Abnormal posture: Secondary | ICD-10-CM | POA: Diagnosis not present

## 2019-09-03 DIAGNOSIS — G8929 Other chronic pain: Secondary | ICD-10-CM | POA: Diagnosis not present

## 2019-09-03 DIAGNOSIS — M25521 Pain in right elbow: Secondary | ICD-10-CM

## 2019-09-03 DIAGNOSIS — M25611 Stiffness of right shoulder, not elsewhere classified: Secondary | ICD-10-CM

## 2019-09-03 DIAGNOSIS — R42 Dizziness and giddiness: Secondary | ICD-10-CM

## 2019-09-03 DIAGNOSIS — M25612 Stiffness of left shoulder, not elsewhere classified: Secondary | ICD-10-CM | POA: Diagnosis not present

## 2019-09-03 DIAGNOSIS — M25512 Pain in left shoulder: Secondary | ICD-10-CM

## 2019-09-03 DIAGNOSIS — M25511 Pain in right shoulder: Secondary | ICD-10-CM | POA: Diagnosis not present

## 2019-09-03 NOTE — Therapy (Addendum)
[facility] [address]  [place], [place], [address] Phone: [phone]   Fax:  [phone]  Physical Therapy Progress Note  Patient Details  Name: Kristina Lee MRN: [id number] Date of Birth: 08/22/1945 Referring Provider (PT): Basil Dess, MD    Progress Note Reporting Period 08/03/19 to 09/03/19  See note below for Objective Data and Assessment of Progress/Goals.     Encounter Date: 09/03/2019  PT End of Session - 09/03/19 1151    Visit Number  9    Number of Visits  13    Date for PT Re-Evaluation  09/14/19    Authorization Type  [employer]    PT Start Time  1103    PT Stop Time  1146    PT Time Calculation (min)  43 min    Activity Tolerance  Patient tolerated treatment well;Patient limited by pain    Behavior During Therapy  WFL for tasks assessed/performed       Past Medical History:  Diagnosis Date  . Diabetes mellitus without complication ([place])   . Heart attack ([place]) 09/07/2017  . Urticaria     Past Surgical History:  Procedure Laterality Date  . ABDOMINAL HYSTERECTOMY  2006   b/l SPO and TAH, uterine cancer  . EYE SURGERY Right 1960  . TUBAL LIGATION      There were no vitals filed for this visit.  Subjective Assessment - 09/03/19 1105    Subjective  Had some increased pain from the R antecubital fossa up towards the neck for a couple days- feeling better today. Will be going on vacation from now until June 1st.    Pertinent History  MI 2019, DM, vascular dementia with behavior disturbance, CAD, GERD, HLD, hypokalemia, lumbosacral DDD, CKD III, HTN, hx of uterine CA    Diagnostic tests  07/18/19 R elbow MRI: Tendinopathy with partial-thickness tearing of the deep attachment fibers of the common extensor tendon. Intact radial and ulnar collateral ligaments. Mild elbow joint degenerative changes    Patient Stated Goals  get rid of pain    Currently in Pain?  Yes    Pain Score  1      Pain Location  Elbow    Pain Orientation  Right    Pain Descriptors / Indicators  Aching    Pain Type  Acute pain    Pain Radiating Towards  up to the shoulder and neck         [facility] PT Assessment - 09/03/19 0001      AROM   Right Shoulder Flexion  154 Degrees    Right Shoulder ABduction  155 Degrees    Right Shoulder Internal Rotation  --   FIR T10   Right Shoulder External Rotation  --   FER T1   Left Shoulder Flexion  143 Degrees    Left Shoulder ABduction  175 Degrees    Left Shoulder Internal Rotation  --   FIR T8   Left Shoulder External Rotation  --   FER T2   Right Elbow Flexion  150    Right Elbow Extension  5      Strength   Right Shoulder Flexion  4+/5    Right Shoulder ABduction  4+/5    Right Shoulder Internal Rotation  4+/5    Right Shoulder External Rotation  4+/5    Left Shoulder Flexion  4+/5    Left Shoulder ABduction  4+/5    Left Shoulder Internal  Rotation  4+/5    Left Shoulder External Rotation  4+/5    Right Elbow Flexion  4+/5    Right Elbow Extension  4/5    Right Hand Grip (lbs)  3.67   5, 3, 3   Right Hand 3 Point Pinch  6.67 lbs   6, 6, 8   Left Hand Grip (lbs)  23.33   20, 25, 25                   [facility] Adult PT Treatment/Exercise - 09/03/19 0001      Shoulder Exercises: Pulleys   Flexion  3 minutes    Flexion Limitations  to tolerance    Scaption  3 minutes    Scaption Limitations  to tolerance      Manual Therapy   Manual Therapy  Soft tissue mobilization;Myofascial release    Manual therapy comments  seated    Soft tissue mobilization  STM to R LS, scalenes, biceps muscle belly, proximal wrist flexors and extensors- TTP and increased soft tissue restriction in LS, wrist extensors    Myofascial Release  manual TPR to R LS, biceps muscle belly,wrist flexors and extensors             PT Education - 09/03/19 1150    Education Details  dicussion on objective progress and remaining impairments; encouraged  patient to continue working on HEP until back from vacation and free to return if needed    Person(s) Educated  Patient    Methods  Explanation;Demonstration    Comprehension  Verbalized understanding       PT Short Term Goals - 09/03/19 1110      PT SHORT TERM GOAL #1   Title  Patient to be independent with initial HEP.    Time  3    Period  Weeks    Status  Achieved    Target Date  08/24/19        PT Long Term Goals - 09/03/19 1110      PT LONG TERM GOAL #1   Title  Patient to be independent with advanced HEP.    Time  6    Period  Weeks    Status  Achieved   met for current     PT LONG TERM GOAL #2   Title  Patient to demonstrate [facility] and pain-free B shoulder and R elbow AROM.    Time  6    Period  Weeks    Status  Achieved      PT LONG TERM GOAL #3   Title  Patient to demonstrate B UE strength >=4+/5 and symmetrical full handed & 3 point pinch grip strength.    Time  6    Period  Weeks    Status  Partially Met   limiting in elbow extension and grip strength     PT LONG TERM GOAL #4   Title  Patient to report 80% improvement in ability to perform overhead lifting of >5lbs without pain.    Time  6    Period  Weeks    Status  Deferred   still avoids lifting     PT LONG TERM GOAL #5   Title  Patient to perform R UE reaching behind back to don/doff jacket with 0/10 pain.    Time  6    Period  Weeks    Status  Achieved   reporting 0/10 pain while simulating this activity  Plan - 09/03/19 1158    Clinical Impression Statement  Patient reporting slight increased in R elbow pain radiating up to the shoulder and neck since last session, but improving today. Noting that she will be going out of town, and agreeable to 30 day hold. Patient has demonstrated good improvement in B shoulder flexion, abduction, and L shoulder ER. Elbow extension and flexion has also improved to [facility]. Patient has met strength goal with exception of elbow extension and grip  strength. Functional activity tolerance goal met for reaching behind the back, however patient has continued to avoid lifting at this time. Tolerated STM and TPR to R LS, scalenes, biceps muscle belly, proximal wrist flexors and extensors- patient with TTP and increased soft tissue restriction in LS, wrist extensors. Patient noting good understanding and comfort with current HEP. Thus, placing patient on 30 day hold at this time- welcome to return if relapse occurs.    Comorbidities  MI 2019, DM, vascular dementia with behavior disturbance, CAD, GERD, HLD, hypokalemia, lumbosacral DDD, CKD III, HTN, hx of uterine CA    Rehab Potential  Good    PT Frequency  2x / week    PT Treatment/Interventions  ADLs/Self Care Home Management;Cryotherapy;Electrical Stimulation;Iontophoresis 55m/ml Dexamethasone;Moist Heat;Balance training;Therapeutic exercise;Therapeutic activities;Functional mobility training;Ultrasound;Neuromuscular re-education;Stair training;Gait training;Cognitive remediation;Patient/family education;Manual techniques;Vasopneumatic Device;Taping;Energy conservation;Dry needling;Passive range of motion;Canalith Repostioning    PT Next Visit Plan  gentle wrist extensor stretching and strengthening, gentle grip strengthening to tolerance, periscapular strengthening    Consulted and Agree with Plan of Care  Patient       Patient will benefit from skilled therapeutic intervention in order to improve the following deficits and impairments:  Increased edema, Decreased activity tolerance, Decreased strength, Increased fascial restricitons, Impaired UE functional use, Pain, Increased muscle spasms, Improper body mechanics, Decreased range of motion, Impaired flexibility, Postural dysfunction  Visit Diagnosis: Chronic right shoulder pain  Chronic left shoulder pain  Stiffness of right shoulder, not elsewhere classified  Stiffness of left shoulder, not elsewhere classified  Pain in right  elbow  Abnormal posture  Dizziness and giddiness     Problem List Patient Active Problem List   Diagnosis Date Noted  . Right arm pain 07/15/2019  . Hair loss 08/10/2018  . Hearing loss 08/10/2018  . Otitis externa 08/10/2018  . Varicose veins of right lower extremity with complications 067/89/3810 . Vascular dementia with behavior disturbance ([place] 02/24/2018  . Myalgia 02/09/2018  . Seasonal allergic rhinitis due to pollen 01/21/2018  . Cough 12/23/2017  . Allergic urticaria 12/23/2017  . History of myocardial infarction in last year 12/23/2017  . Diabetes mellitus ([place] 12/23/2017  . Rash and nonspecific skin eruption 12/23/2017  . Intrinsic atopic dermatitis 12/23/2017  . CAD (coronary artery disease) 09/25/2017  . Low back pain 09/25/2017  . Gastroesophageal reflux disease with esophagitis 09/04/2017  . Hyperlipidemia associated with type 2 diabetes mellitus ([place] 12/31/2016  . Hypokalemia 10/28/2015  . DDD (degenerative disc disease), lumbosacral 09/13/2015  . Diverticulosis of large intestine 09/13/2015  . Seborrheic dermatitis of scalp 09/13/2015  . Chronic kidney disease, stage III (moderate) 05/02/2015  . Allergic rhinitis 07/14/2014  . Essential hypertension 07/14/2014  . History of uterine cancer 07/14/2014     YJanene Harvey PT, DPT 09/03/19 12:17 PM   [facility] [place] [place] [address]Phone: [phone]  Fax:  [phone] Name: JLorynn MoeserMRN: [id number] of Birth: [birth year]  PHYSICAL THERAPY DISCHARGE SUMMARY  Visits from Start of Care: 9  Current functional level related to goals / functional outcomes: See above clinical impression; patient did not return during 30 day hold period   Remaining deficits: Decreased UE strength   Education / Equipment: HEP  Plan: Patient agrees to discharge.  Patient goals were partially met. Patient  is being discharged due to being pleased with the current functional level.  ?????     Janene Harvey, PT, DPT 10/06/19 8:32 AM

## 2019-09-17 ENCOUNTER — Ambulatory Visit: Payer: Medicare PPO | Admitting: *Deleted

## 2019-09-23 ENCOUNTER — Encounter: Payer: Self-pay | Admitting: Specialist

## 2019-09-23 ENCOUNTER — Other Ambulatory Visit: Payer: Self-pay

## 2019-09-23 ENCOUNTER — Ambulatory Visit (INDEPENDENT_AMBULATORY_CARE_PROVIDER_SITE_OTHER): Payer: Medicare PPO | Admitting: Specialist

## 2019-09-23 VITALS — BP 131/72 | HR 59 | Ht 61.5 in | Wt 131.0 lb

## 2019-09-23 DIAGNOSIS — G5601 Carpal tunnel syndrome, right upper limb: Secondary | ICD-10-CM

## 2019-09-23 DIAGNOSIS — M542 Cervicalgia: Secondary | ICD-10-CM

## 2019-09-23 DIAGNOSIS — R202 Paresthesia of skin: Secondary | ICD-10-CM

## 2019-09-23 DIAGNOSIS — M79601 Pain in right arm: Secondary | ICD-10-CM | POA: Diagnosis not present

## 2019-09-23 DIAGNOSIS — M7711 Lateral epicondylitis, right elbow: Secondary | ICD-10-CM

## 2019-09-23 DIAGNOSIS — R200 Anesthesia of skin: Secondary | ICD-10-CM | POA: Diagnosis not present

## 2019-09-23 DIAGNOSIS — M4722 Other spondylosis with radiculopathy, cervical region: Secondary | ICD-10-CM

## 2019-09-23 MED ORDER — DICLOFENAC SODIUM 1 % EX GEL: 2.0000 g | Freq: Four times a day (QID) | CUTANEOUS | 2 refills | Status: DC

## 2019-09-23 MED ORDER — MELOXICAM 15 MG PO TABS: 15.0000 mg | ORAL_TABLET | Freq: Every day | ORAL | 2 refills | Status: AC

## 2019-09-23 NOTE — Progress Notes (Signed)
Office Visit Note   Patient: Kristina Lee           Date of Birth: 1946-01-04           MRN: [id number] Visit Date: 09/23/2019              Requested by: Bradd Canary, MD [address] [place],  [place] [address] PCP: Bradd Canary, MD   Assessment & Plan: Visit Diagnoses:  1. Lateral epicondylitis, right elbow   2. Carpal tunnel syndrome, right upper limb   3. Cervicalgia   4. Numbness and tingling in both hands   5. Right arm pain   6. Other spondylosis with radiculopathy, cervical region     Plan: Avoid overhead lifting and overhead use of the arms. Do not lift greater than 5 lbs. Adjust head rest in vehicle to prevent hyperextension if rear ended. Take extra precautions to avoid falling.Carpal Tunnel Syndrome  Carpal tunnel syndrome is a condition that causes pain in your hand and arm. The carpal tunnel is a narrow area located on the palm side of your wrist. Repeated wrist motion or certain diseases may cause swelling within the tunnel. This swelling pinches the main nerve in the wrist (median nerve). What are the causes? This condition may be caused by:  Repeated wrist motions.  Wrist injuries.  Arthritis.  A cyst or tumor in the carpal tunnel.  Fluid buildup during pregnancy. Sometimes the cause of this condition is not known. What increases the risk? This condition is more likely to develop in:  People who have jobs that cause them to repeatedly move their wrists in the same motion, such as Health visitor.  Women.  People with certain conditions, such as: ? Diabetes. ? Obesity. ? An underactive thyroid (hypothyroidism). ? Kidney failure. What are the signs or symptoms? Symptoms of this condition include:  A tingling feeling in your fingers, especially in your thumb, index, and middle fingers.  Tingling or numbness in your hand.  An aching feeling in your entire arm, especially when your wrist and elbow are bent for  long periods of time.  Wrist pain that goes up your arm to your shoulder.  Pain that goes down into your palm or fingers.  A weak feeling in your hands. You may have trouble grabbing and holding items. Your symptoms may feel worse during the night. How is this diagnosed? This condition is diagnosed with a medical history and physical exam. You may also have tests, including:  An electromyogram (EMG). This test measures electrical signals sent by your nerves into the muscles.  X-rays. How is this treated? Treatment for this condition includes:  Lifestyle changes. It is important to stop doing or modify the activity that caused your condition.  Physical or occupational therapy.  Medicines for pain and inflammation. This may include medicine that is injected into your wrist.  A wrist splint.  Surgery. Follow these instructions at home: If you have a splint:   Wear it as told by your health care provider. Remove it only as told by your health care provider.  Loosen the splint if your fingers become numb and tingle, or if they turn cold and blue.  Keep the splint clean and dry. General instructions   Take over-the-counter and prescription medicines only as told by your health care provider.  Rest your wrist from any activity that may be causing your pain. If your condition is work related, talk to your  employer about changes that can be made, such as getting a wrist pad to use while typing.  If directed, apply ice to the painful area: ? Put ice in a plastic bag. ? Place a towel between your skin and the bag. ? Leave the ice on for 20 minutes, 2-3 times per day.  Keep all follow-up visits as told by your health care provider. This is important.  Do any exercises as told by your health care provider, physical therapist, or occupational therapist. Contact a health care provider if:  You have new symptoms.  Your pain is not controlled with medicines.  Your symptoms get  worse. This information is not intended to replace advice given to you by your health care provider. Make sure you discuss any questions you have with your health care provider. Document Released: 03/30/2000 Document Revised: 08/11/2015 Document Reviewed: 12/12/2016 Elsevier Interactive Patient Education  2017 [employer]. Avoid overhead lifting and overhead use of the arms. Pillows to keep from sleeping directly on the shoulders Limited lifting to less than 10 lbs. Ice or heat for relief. NSAIDs are helpful, such as alleve or motrin, be careful not to use in excess as they place burdens on the kidney. Stretching exercise help and strengthening is helpful to build endurance.  Apply voltaren gel to finger joints that are tender and swollen 3-4 times a day when painful. Use also on the top of the hand where the extensor tendon may be irritated by a prominence off of the base of the bone to the long finger ( metacarpal base)  Follow-Up Instructions: Return in about 6 months (around 03/24/2020).   Orders:  No orders of the defined types were placed in this encounter.  Meds ordered this encounter  Medications  . diclofenac Sodium (VOLTAREN) 1 % GEL    Sig: Apply 2 g topically 4 (four) times daily.    Dispense:  350 g    Refill:  2  . meloxicam (MOBIC) 15 MG tablet    Sig: Take 1 tablet (15 mg total) by mouth daily.    Dispense:  30 tablet    Refill:  2      Procedures: No procedures performed   Clinical Data: No additional findings.   Subjective: Chief Complaint  Patient presents with  . Neck - Follow-up  . Right Hand - Follow-up  . Left Hand - Follow-up    74 year old female with right elbow tendonitis, right CTS, right C6 spondylosis with right shoulder, right lateral elbow. Therapy helped and she is doing exercises on her own. Had An episode of vertigo during PT and vestibular maneuvers helped to stop the vertigo.She has a small bump right dorsal hand underlying right  long finger tendon that is irritating. Neck still pops stiffness is better. No bowel or bladder difficulty. Able to walk   Review of Systems  Constitutional: Negative.   HENT: Negative.   Eyes: Negative.   Respiratory: Negative.   Cardiovascular: Negative.   Gastrointestinal: Negative.   Endocrine: Negative.   Genitourinary: Negative.   Musculoskeletal: Negative.   Skin: Negative.   Allergic/Immunologic: Negative.   Neurological: Negative.   Hematological: Negative.   Psychiatric/Behavioral: Negative.      Objective: Vital Signs: BP 131/72 (BP Location: Left Arm, Patient Position: Sitting)   Pulse (!) 59   Ht 5' 1.5" (1.562 m)   Wt 131 lb (59.4 kg)   LMP  (LMP Unknown)   BMI 24.35 kg/m   Physical Exam Constitutional:  Appearance: She is well-developed.  HENT:     Head: Normocephalic and atraumatic.  Eyes:     Pupils: Pupils are equal, round, and reactive to light.  Pulmonary:     Effort: Pulmonary effort is normal.     Breath sounds: Normal breath sounds.  Abdominal:     General: Bowel sounds are normal.     Palpations: Abdomen is soft.  Musculoskeletal:        General: Normal range of motion.     Cervical back: Normal range of motion and neck supple.  Skin:    General: Skin is warm and dry.  Neurological:     Mental Status: She is alert and oriented to person, place, and time.  Psychiatric:        Behavior: Behavior normal.        Thought Content: Thought content normal.        Judgment: Judgment normal.     Back Exam   Tenderness  The patient is experiencing tenderness in the lumbar.  Range of Motion  Lateral bend right: normal  Lateral bend left: normal  Rotation right: normal  Rotation left: normal   Muscle Strength  Right Quadriceps:  5/5  Left Quadriceps:  5/5  Right Hamstrings:  5/5  Left Hamstrings:  5/5    Right Elbow Exam   Tenderness  The patient is experiencing tenderness in the lateral epicondyle.       Specialty  Comments:  No specialty comments available.  Imaging: No results found.   PMFS History: Patient Active Problem List   Diagnosis Date Noted  . Right arm pain 07/15/2019  . Hair loss 08/10/2018  . Hearing loss 08/10/2018  . Otitis externa 08/10/2018  . Varicose veins of right lower extremity with complications 02/10/2535  . Vascular dementia with behavior disturbance ([place]) 02/24/2018  . Myalgia 02/09/2018  . Seasonal allergic rhinitis due to pollen 01/21/2018  . Cough 12/23/2017  . Allergic urticaria 12/23/2017  . History of myocardial infarction in last year 12/23/2017  . Diabetes mellitus ([place]) 12/23/2017  . Rash and nonspecific skin eruption 12/23/2017  . Intrinsic atopic dermatitis 12/23/2017  . CAD (coronary artery disease) 09/25/2017  . Low back pain 09/25/2017  . Gastroesophageal reflux disease with esophagitis 09/04/2017  . Hyperlipidemia associated with type 2 diabetes mellitus ([place]) 12/31/2016  . Hypokalemia 10/28/2015  . DDD (degenerative disc disease), lumbosacral 09/13/2015  . Diverticulosis of large intestine 09/13/2015  . Seborrheic dermatitis of scalp 09/13/2015  . Chronic kidney disease, stage III (moderate) 05/02/2015  . Allergic rhinitis 07/14/2014  . Essential hypertension 07/14/2014  . History of uterine cancer 07/14/2014   Past Medical History:  Diagnosis Date  . Diabetes mellitus without complication ([place])   . Heart attack ([place]) 09/07/2017  . Urticaria     Family History  Problem Relation Age of Onset  . Cancer Mother        liver cancer, breast cancer x 2   . Alcohol abuse Mother        smoker  . Heart disease Father        MI  . Alcohol abuse Father        smoker  . Depression Sister   . Tremor Sister   . Cancer Brother        pancreatic  . Healthy Son   . Diabetes Maternal Grandmother   . Hearing loss Maternal Grandmother   . Depression Maternal Grandmother   . Allergic rhinitis Neg Hx   . Angioedema  Neg Hx   . Asthma Neg Hx   .  Eczema Neg Hx   . Immunodeficiency Neg Hx   . Urticaria Neg Hx     Past Surgical History:  Procedure Laterality Date  . ABDOMINAL HYSTERECTOMY  2006   b/l SPO and TAH, uterine cancer  . EYE SURGERY Right 1960  . TUBAL LIGATION     Social History   Occupational History  . Not on file  Tobacco Use  . Smoking status: Former Smoker    Packs/day: 0.50    Years: 24.00    Pack years: 12.00    Types: Cigarettes    Quit date: 04/17/1987    Years since quitting: 32.4  . Smokeless tobacco: Never Used  Substance and Sexual Activity  . Alcohol use: Yes    Comment: occ  . Drug use: Not Currently  . Sexual activity: Yes

## 2019-09-23 NOTE — Patient Instructions (Signed)
Avoid overhead lifting and overhead use of the arms. Do not lift greater than 5 lbs. Adjust head rest in vehicle to prevent hyperextension if rear ended. Take extra precautions to avoid falling.Carpal Tunnel Syndrome  Carpal tunnel syndrome is a condition that causes pain in your hand and arm. The carpal tunnel is a narrow area located on the palm side of your wrist. Repeated wrist motion or certain diseases may cause swelling within the tunnel. This swelling pinches the main nerve in the wrist (median nerve). What are the causes? This condition may be caused by:  Repeated wrist motions.  Wrist injuries.  Arthritis.  A cyst or tumor in the carpal tunnel.  Fluid buildup during pregnancy. Sometimes the cause of this condition is not known. What increases the risk? This condition is more likely to develop in:  People who have jobs that cause them to repeatedly move their wrists in the same motion, such as Art gallery manager.  Women.  People with certain conditions, such as: ? Diabetes. ? Obesity. ? An underactive thyroid (hypothyroidism). ? Kidney failure. What are the signs or symptoms? Symptoms of this condition include:  A tingling feeling in your fingers, especially in your thumb, index, and middle fingers.  Tingling or numbness in your hand.  An aching feeling in your entire arm, especially when your wrist and elbow are bent for long periods of time.  Wrist pain that goes up your arm to your shoulder.  Pain that goes down into your palm or fingers.  A weak feeling in your hands. You may have trouble grabbing and holding items. Your symptoms may feel worse during the night. How is this diagnosed? This condition is diagnosed with a medical history and physical exam. You may also have tests, including:  An electromyogram (EMG). This test measures electrical signals sent by your nerves into the muscles.  X-rays. How is this treated? Treatment for this condition  includes:  Lifestyle changes. It is important to stop doing or modify the activity that caused your condition.  Physical or occupational therapy.  Medicines for pain and inflammation. This may include medicine that is injected into your wrist.  A wrist splint.  Surgery. Follow these instructions at home: If you have a splint:   Wear it as told by your health care provider. Remove it only as told by your health care provider.  Loosen the splint if your fingers become numb and tingle, or if they turn cold and blue.  Keep the splint clean and dry. General instructions   Take over-the-counter and prescription medicines only as told by your health care provider.  Rest your wrist from any activity that may be causing your pain. If your condition is work related, talk to your employer about changes that can be made, such as getting a wrist pad to use while typing.  If directed, apply ice to the painful area: ? Put ice in a plastic bag. ? Place a towel between your skin and the bag. ? Leave the ice on for 20 minutes, 2-3 times per day.  Keep all follow-up visits as told by your health care provider. This is important.  Do any exercises as told by your health care provider, physical therapist, or occupational therapist. Contact a health care provider if:  You have new symptoms.  Your pain is not controlled with medicines.  Your symptoms get worse. This information is not intended to replace advice given to you by your health care provider. Make sure you  discuss any questions you have with your health care provider. Document Released: 03/30/2000 Document Revised: 08/11/2015 Document Reviewed: 12/12/2016 Elsevier Interactive Patient Education  2017 [employer]. Avoid overhead lifting and overhead use of the arms. Pillows to keep from sleeping directly on the shoulders Limited lifting to less than 10 lbs. Ice or heat for relief. NSAIDs are helpful, such as alleve or motrin, be  careful not to use in excess as they place burdens on the kidney. Stretching exercise help and strengthening is helpful to build endurance.  Apply voltaren gel to finger joints that are tender and swollen 3-4 times a day when painful. Use also on the top of the hand where the extensor tendon may be irritated by a prominence off of the base of the bone to the long finger ( metacarpal base)

## 2019-09-24 NOTE — Progress Notes (Signed)
I connected with Cerita today by telephone and verified that I am speaking with the correct person using two identifiers. Location patient: home Location provider: work Persons participating in the virtual visit: patient, Marine scientist.    I discussed the limitations, risks, security and privacy concerns of performing an evaluation and management service by telephone and the availability of in person appointments. I also discussed with the patient that there may be a patient responsible charge related to this service. The patient expressed understanding and verbally consented to this telephonic visit.    Interactive audio and video telecommunications were attempted between this provider and patient, however failed, due to patient having technical difficulties OR patient did not have access to video capability.  We continued and completed visit with audio only.  Some vital signs may be absent or patient reported.    Subjective:   Kristina Lee is a 74 y.o. female who presents for Medicare Annual (Subsequent) preventive examination.  Review of Systems:  Home Safety/Smoke Alarms: Feels safe in home. Smoke alarms in place.  Lives with husband, her son, and 4 grand kids.2 story home. No issue navigating stairs.   Female:       Mammo-01/23/19       Dexa scan- pt states she will discuss w/ PCP     CCS- 08/23/16. No longer doing routine screening due to age.      Objective:     Vitals: LMP  (LMP Unknown)   There is no height or weight on file to calculate BMI.  Advanced Directives 09/25/2019 08/03/2019 07/22/2018 04/29/2018 11/18/2017  Does Patient Have a Medical Advance Directive? Yes Yes Yes Yes No  Type of Paramedic of [place];Living will - [place];Living will - -  Does patient want to make changes to medical advance directive? No - Patient declined No - Patient declined No - Guardian declined No - Patient declined -  Copy of [place] in Chart? No - copy requested - No - copy requested - -  Would patient like information on creating a medical advance directive? - - - - No - Patient declined    Tobacco Social History   Tobacco Use  Smoking Status Former Smoker  . Packs/day: 0.50  . Years: 24.00  . Pack years: 12.00  . Types: Cigarettes  . Quit date: 04/17/1987  . Years since quitting: 32.4  Smokeless Tobacco Never Used     Counseling given: Not Answered   Clinical Intake:     Pain : No/denies pain                 Past Medical History:  Diagnosis Date  . Diabetes mellitus without complication ([place])   . Heart attack ([place]) 09/07/2017  . Urticaria    Past Surgical History:  Procedure Laterality Date  . ABDOMINAL HYSTERECTOMY  2006   b/l SPO and TAH, uterine cancer  . EYE SURGERY Right 1960  . TUBAL LIGATION     Family History  Problem Relation Age of Onset  . Cancer Mother        liver cancer, breast cancer x 2   . Alcohol abuse Mother        smoker  . Heart disease Father        MI  . Alcohol abuse Father        smoker  . Depression Sister   . Tremor Sister   . Cancer Brother  pancreatic  . Healthy Son   . Diabetes Maternal Grandmother   . Hearing loss Maternal Grandmother   . Depression Maternal Grandmother   . Allergic rhinitis Neg Hx   . Angioedema Neg Hx   . Asthma Neg Hx   . Eczema Neg Hx   . Immunodeficiency Neg Hx   . Urticaria Neg Hx    Social History   Socioeconomic History  . Marital status: Married    Spouse name: Not on file  . Number of children: Not on file  . Years of education: Not on file  . Highest education level: Not on file  Occupational History  . Not on file  Tobacco Use  . Smoking status: Former Smoker    Packs/day: 0.50    Years: 24.00    Pack years: 12.00    Types: Cigarettes    Quit date: 04/17/1987    Years since quitting: 32.4  . Smokeless tobacco: Never Used  Vaping Use  . Vaping Use: Never used  Substance and Sexual  Activity  . Alcohol use: Yes    Comment: occ  . Drug use: Not Currently  . Sexual activity: Yes  Other Topics Concern  . Not on file  Social History Narrative  . Not on file   Social Determinants of Health   Financial Resource Strain: Low Risk   . Difficulty of Paying Living Expenses: Not hard at all  Food Insecurity: No Food Insecurity  . Worried About Charity fundraiser in the Last Year: Never true  . Ran Out of Food in the Last Year: Never true  Transportation Needs: No Transportation Needs  . Lack of Transportation (Medical): No  . Lack of Transportation (Non-Medical): No  Physical Activity:   . Days of Exercise per Week:   . Minutes of Exercise per Session:   Stress:   . Feeling of Stress :   Social Connections:   . Frequency of Communication with Friends and Family:   . Frequency of Social Gatherings with Friends and Family:   . Attends Religious Services:   . Active Member of Clubs or Organizations:   . Attends Archivist Meetings:   Marland Kitchen Marital Status:     Outpatient Encounter Medications as of 09/25/2019  Medication Sig  . Accu-Chek Softclix Lancets lancets Use as instructed  . Acetaminophen (TYLENOL PO) Take by mouth as needed.  Marland Kitchen aspirin EC 81 MG tablet Take 81 mg by mouth daily.  Marland Kitchen atorvastatin (LIPITOR) 80 MG tablet TAKE 1 TABLET BY MOUTH EVERY DAY  . augmented betamethasone dipropionate (DIPROLENE-AF) 0.05 % cream Apply topically daily.  . blood glucose meter kit and supplies KIT Dispense based on patient and insurance preference. Check blood sugars tid prn Dx; E11.9  . Calcium Carbonate-Vit D-Min (CALCIUM 600+D PLUS MINERALS) 600-400 MG-UNIT TABS Take 2 tablets by mouth daily.   . clopidogrel (PLAVIX) 75 MG tablet Take 1 tablet (75 mg total) by mouth daily.  . diclofenac sodium (VOLTAREN) 1 % GEL APPLY 4 G TOPICALLY 4 (FOUR) TIMES DAILY AS NEEDED FOR PAIN  . fexofenadine (ALLEGRA) 180 MG tablet Take 1 tablet (180 mg total) by mouth 2 (two) times  daily. (Patient taking differently: Take 180 mg by mouth as needed. )  . fluticasone (FLONASE) 50 MCG/ACT nasal spray Place 2 sprays into both nostrils daily. (Patient taking differently: Place 2 sprays into both nostrils as needed. )  . glucose blood (COOL BLOOD GLUCOSE TEST STRIPS) test strip Use as instructedDispense based on  patient and insurance preference. Check blood sugars tid prn Dx; E11.9  . Lancets MISC Dispense based on patient and insurance preference. Check blood sugars tid prn Dx; E11.9  . lisinopril (ZESTRIL) 5 MG tablet Take 1 tablet (5 mg total) by mouth 2 (two) times daily.  . meloxicam (MOBIC) 15 MG tablet Take 1 tablet (15 mg total) by mouth daily.  Marland Kitchen OVER THE COUNTER MEDICATION as needed.  . polyethylene glycol (MIRALAX / GLYCOLAX) 17 g packet Take 17 g by mouth as needed.  . methylPREDNISolone (MEDROL) 4 MG tablet Take 1 tablet (4 mg total) by mouth daily for 14 days, THEN 0.5 tablets (2 mg total) daily for 14 days.  . traMADol-acetaminophen (ULTRACET) 37.5-325 MG tablet Take 1 tablet by mouth every 6 (six) hours as needed. (Patient not taking: Reported on 09/25/2019)  . [DISCONTINUED] ALPRAZolam (XANAX) 0.25 MG tablet Take one tablet at the MRI befores study after paperwork completed and may repeat once after 30 minutes if no response to the medication, relief of anxiety or claustrophobia.  . [DISCONTINUED] diclofenac Sodium (VOLTAREN) 1 % GEL Apply 2 g topically 4 (four) times daily.  . [DISCONTINUED] famotidine (PEPCID) 20 MG tablet Take 1 tablet (20 mg total) by mouth 2 (two) times daily as needed for heartburn or indigestion.  . [DISCONTINUED] gabapentin (NEURONTIN) 100 MG capsule Take 1 capsule (100 mg total) by mouth at bedtime.   No facility-administered encounter medications on file as of 09/25/2019.    Activities of Daily Living In your present state of health, do you have any difficulty performing the following activities: 09/25/2019  Hearing? N  Vision? N    Difficulty concentrating or making decisions? N  Walking or climbing stairs? N  Dressing or bathing? N  Doing errands, shopping? N  Preparing Food and eating ? N  Using the Toilet? N  In the past six months, have you accidently leaked urine? N  Do you have problems with loss of bowel control? N  Managing your Medications? N  Managing your Finances? N  Housekeeping or managing your Housekeeping? N  Some recent data might be hidden    Patient Care Team: Mosie Lukes, MD as PCP - General (Family Medicine)    Assessment:   This is a routine wellness examination for Jacy. Physical assessment deferred to PCP.  Exercise Activities and Dietary recommendations Current Exercise Habits: Home exercise routine, Time (Minutes): 10, Frequency (Times/Week): 7, Weekly Exercise (Minutes/Week): 70, Intensity: Mild, Exercise limited by: None identified Diet (meal preparation, eat out, water intake, caffeinated beverages, dairy products, fruits and vegetables): in general, a "healthy" diet  , well balanced   Goals    . Increase physical activity       Fall Risk Fall Risk  09/25/2019 09/25/2019 07/22/2018  Falls in the past year? (No Data) 1 0  Comment tripped over dog. - -  Number falls in past yr: - 0 -  Injury with Fall? - 1 -  Follow up - Education provided;Falls prevention discussed -   Depression Screen PHQ 2/9 Scores 09/25/2019 07/22/2018  PHQ - 2 Score 0 0     Cognitive Function   Ad8 score reviewed for issues:  Issues making decisions: no  Less interest in hobbies / activities:no  Repeats questions, stories (family complaining):no  Trouble using ordinary gadgets (microwave, computer, phone):no  Forgets the month or year: no  Mismanaging finances: no  Remembering appts:no  Daily problems with thinking and/or memory:no Ad8 score is=0  Immunization History  Administered Date(s) Administered  . Influenza, High Dose Seasonal PF 01/18/2015, 02/02/2016,  01/17/2017, 12/31/2017  . Influenza,inj,Quad PF,6+ Mos 02/14/2014  . Influenza-Unspecified 12/31/2017  . PFIZER SARS-COV-2 Vaccination 06/18/2019, 07/14/2019  . Pneumococcal Conjugate-13 05/27/2013, 05/27/2013  . Pneumococcal Polysaccharide-23 04/16/2014, 04/16/2014  . Tdap 07/14/2014, 07/14/2014  . Zoster 04/17/2011, 04/17/2011    Screening Tests Health Maintenance  Topic Date Due  . Hepatitis C Screening  Never done  . FOOT EXAM  Never done  . DEXA SCAN  Never done  . INFLUENZA VACCINE  11/15/2019  . HEMOGLOBIN A1C  11/15/2019  . OPHTHALMOLOGY EXAM  01/03/2020  . MAMMOGRAM  01/22/2021  . TETANUS/TDAP  07/13/2024  . COLONOSCOPY  04/16/2026  . COVID-19 Vaccine  Completed  . PNA vac Low Risk Adult  Completed      Plan:    Please schedule your next medicare wellness visit with me in 1 yr.  Continue to eat heart healthy diet (full of fruits, vegetables, whole grains, lean protein, water--limit salt, fat, and sugar intake) and increase physical activity as tolerated.  Continue doing brain stimulating activities (puzzles, reading, adult coloring books, staying active) to keep memory sharp.   Bring a copy of your living will and/or healthcare power of attorney to your next office visit.    I have personally reviewed and noted the following in the patient's chart:   . Medical and social history . Use of alcohol, tobacco or illicit drugs  . Current medications and supplements . Functional ability and status . Nutritional status . Physical activity . Advanced directives . List of other physicians . Hospitalizations, surgeries, and ER visits in previous 12 months . Vitals . Screenings to include cognitive, depression, and falls . Referrals and appointments  In addition, I have reviewed and discussed with patient certain preventive protocols, quality metrics, and best practice recommendations. A written personalized care plan for preventive services as well as general  preventive health recommendations were provided to patient.   Due to this being a telephonic visit, the after visit summary with patients personalized plan was offered to patient via mail or my-chart.  Patient would like to access on my-chart.  Shela Nevin, [place]  09/25/2019

## 2019-09-25 ENCOUNTER — Encounter: Payer: Self-pay | Admitting: *Deleted

## 2019-09-25 ENCOUNTER — Ambulatory Visit (INDEPENDENT_AMBULATORY_CARE_PROVIDER_SITE_OTHER): Payer: Medicare PPO | Admitting: *Deleted

## 2019-09-25 DIAGNOSIS — Z0000 Encounter for general adult medical examination without abnormal findings: Secondary | ICD-10-CM | POA: Diagnosis not present

## 2019-09-25 NOTE — Patient Instructions (Signed)
Please schedule your next medicare wellness visit with me in 1 yr.  Continue to eat heart healthy diet (full of fruits, vegetables, whole grains, lean protein, water--limit salt, fat, and sugar intake) and increase physical activity as tolerated.  Continue doing brain stimulating activities (puzzles, reading, adult coloring books, staying active) to keep memory sharp.   Bring a copy of your living will and/or healthcare power of attorney to your next office visit.   Kristina Lee , Thank you for taking time to come for your Medicare Wellness Visit. I appreciate your ongoing commitment to your health goals. Please review the following plan we discussed and let me know if I can assist you in the future.   These are the goals we discussed: Goals    . Increase physical activity       This is a list of the screening recommended for you and due dates:  Health Maintenance  Topic Date Due  .  Hepatitis C: One time screening is recommended by Center for Disease Control  (CDC) for  adults born from [birth year] through 1965.   Never done  . Complete foot exam   Never done  . DEXA scan (bone density measurement)  Never done  . Flu Shot  11/15/2019  . Hemoglobin A1C  11/15/2019  . Eye exam for diabetics  01/03/2020  . Mammogram  01/22/2021  . Tetanus Vaccine  07/13/2024  . Colon Cancer Screening  04/16/2026  . COVID-19 Vaccine  Completed  . Pneumonia vaccines  Completed    Preventive Care 31 Years and Older, Female Preventive care refers to lifestyle choices and visits with your health care provider that can promote health and wellness. This includes:  A yearly physical exam. This is also called an annual well check.  Regular dental and eye exams.  Immunizations.  Screening for certain conditions.  Healthy lifestyle choices, such as diet and exercise. What can I expect for my preventive care visit? Physical exam Your health care provider will check:  Height and weight. These may be used  to calculate body mass index (BMI), which is a measurement that tells if you are at a healthy weight.  Heart rate and blood pressure.  Your skin for abnormal spots. Counseling Your health care provider may ask you questions about:  Alcohol, tobacco, and drug use.  Emotional well-being.  Home and relationship well-being.  Sexual activity.  Eating habits.  History of falls.  Memory and ability to understand (cognition).  Work and work Statistician.  Pregnancy and menstrual history. What immunizations do I need?  Influenza (flu) vaccine  This is recommended every year. Tetanus, diphtheria, and pertussis (Tdap) vaccine  You may need a Td booster every 10 years. Varicella (chickenpox) vaccine  You may need this vaccine if you have not already been vaccinated. Zoster (shingles) vaccine  You may need this after age 31. Pneumococcal conjugate (PCV13) vaccine  One dose is recommended after age 66. Pneumococcal polysaccharide (PPSV23) vaccine  One dose is recommended after age 74. Measles, mumps, and rubella (MMR) vaccine  You may need at least one dose of MMR if you were born in 1957 or later. You may also need a second dose. Meningococcal conjugate (MenACWY) vaccine  You may need this if you have certain conditions. Hepatitis A vaccine  You may need this if you have certain conditions or if you travel or work in places where you may be exposed to hepatitis A. Hepatitis B vaccine  You may need this if you  have certain conditions or if you travel or work in places where you may be exposed to hepatitis B. Haemophilus influenzae type b (Hib) vaccine  You may need this if you have certain conditions. You may receive vaccines as individual doses or as more than one vaccine together in one shot (combination vaccines). Talk with your health care provider about the risks and benefits of combination vaccines. What tests do I need? Blood tests  Lipid and cholesterol  levels. These may be checked every 5 years, or more frequently depending on your overall health.  Hepatitis C test.  Hepatitis B test. Screening  Lung cancer screening. You may have this screening every year starting at age 69 if you have a 30-pack-year history of smoking and currently smoke or have quit within the past 15 years.  Colorectal cancer screening. All adults should have this screening starting at age 53 and continuing until age 90. Your health care provider may recommend screening at age 15 if you are at increased risk. You will have tests every 1-10 years, depending on your results and the type of screening test.  Diabetes screening. This is done by checking your blood sugar (glucose) after you have not eaten for a while (fasting). You may have this done every 1-3 years.  Mammogram. This may be done every 1-2 years. Talk with your health care provider about how often you should have regular mammograms.  BRCA-related cancer screening. This may be done if you have a family history of breast, ovarian, tubal, or peritoneal cancers. Other tests  Sexually transmitted disease (STD) testing.  Bone density scan. This is done to screen for osteoporosis. You may have this done starting at age 42. Follow these instructions at home: Eating and drinking  Eat a diet that includes fresh fruits and vegetables, whole grains, lean protein, and low-fat dairy products. Limit your intake of foods with high amounts of sugar, saturated fats, and salt.  Take vitamin and mineral supplements as recommended by your health care provider.  Do not drink alcohol if your health care provider tells you not to drink.  If you drink alcohol: ? Limit how much you have to 0-1 drink a day. ? Be aware of how much alcohol is in your drink. In the [place] one drink equals one 12 oz bottle of beer (355 mL), one 5 oz glass of wine (148 mL), or one 1 oz glass of hard liquor (44 mL). Lifestyle  Take daily care of  your teeth and gums.  Stay active. Exercise for at least 30 minutes on 5 or more days each week.  Do not use any products that contain nicotine or tobacco, such as cigarettes, e-cigarettes, and chewing tobacco. If you need help quitting, ask your health care provider.  If you are sexually active, practice safe sex. Use a condom or other form of protection in order to prevent STIs (sexually transmitted infections).  Talk with your health care provider about taking a low-dose aspirin or statin. What's next?  Go to your health care provider once a year for a well check visit.  Ask your health care provider how often you should have your eyes and teeth checked.  Stay up to date on all vaccines. This information is not intended to replace advice given to you by your health care provider. Make sure you discuss any questions you have with your health care provider. Document Revised: 03/27/2018 Document Reviewed: 03/27/2018 Elsevier Patient Education  2020 [employer].

## 2019-10-01 ENCOUNTER — Other Ambulatory Visit: Payer: Self-pay

## 2019-10-01 ENCOUNTER — Ambulatory Visit (INDEPENDENT_AMBULATORY_CARE_PROVIDER_SITE_OTHER): Payer: Medicare PPO | Admitting: Family Medicine

## 2019-10-01 VITALS — BP 130/80 | HR 58 | Temp 97.4°F | Resp 12 | Ht 62.0 in | Wt 140.0 lb

## 2019-10-01 DIAGNOSIS — E1169 Type 2 diabetes mellitus with other specified complication: Secondary | ICD-10-CM

## 2019-10-01 DIAGNOSIS — J301 Allergic rhinitis due to pollen: Secondary | ICD-10-CM

## 2019-10-01 DIAGNOSIS — E785 Hyperlipidemia, unspecified: Secondary | ICD-10-CM

## 2019-10-01 DIAGNOSIS — J029 Acute pharyngitis, unspecified: Secondary | ICD-10-CM

## 2019-10-01 DIAGNOSIS — I10 Essential (primary) hypertension: Secondary | ICD-10-CM | POA: Diagnosis not present

## 2019-10-01 DIAGNOSIS — K2100 Gastro-esophageal reflux disease with esophagitis, without bleeding: Secondary | ICD-10-CM | POA: Diagnosis not present

## 2019-10-01 DIAGNOSIS — L500 Allergic urticaria: Secondary | ICD-10-CM | POA: Diagnosis not present

## 2019-10-01 LAB — COMPREHENSIVE METABOLIC PANEL
ALT: 14 U/L (ref 0–35)
AST: 20 U/L (ref 0–37)
Albumin: 4.3 g/dL (ref 3.5–5.2)
Alkaline Phosphatase: 60 U/L (ref 39–117)
BUN: 19 mg/dL (ref 6–23)
CO2: 30 mEq/L (ref 19–32)
Calcium: 9.9 mg/dL (ref 8.4–10.5)
Chloride: 105 mEq/L (ref 96–112)
Creatinine, Ser: 0.9 mg/dL (ref 0.40–1.20)
GFR: 61.19 mL/min (ref >60.00)
Glucose, Bld: 106 mg/dL — ABNORMAL HIGH (ref 70–99)
Potassium: 3.8 mEq/L (ref 3.5–5.1)
Sodium: 141 mEq/L (ref 135–145)
Total Bilirubin: 2 mg/dL — ABNORMAL HIGH (ref 0.2–1.2)
Total Protein: 6.6 g/dL (ref 6.0–8.3)

## 2019-10-01 LAB — TSH: TSH: 1.03 u[IU]/mL (ref 0.35–4.50)

## 2019-10-01 LAB — LIPID PANEL
Cholesterol: 108 mg/dL (ref 0–200)
HDL: 47.5 mg/dL (ref >39.00)
LDL Cholesterol: 44 mg/dL (ref 0–99)
NonHDL: 60.95
Total CHOL/HDL Ratio: 2
Triglycerides: 84 mg/dL (ref 0.0–149.0)
VLDL: 16.8 mg/dL (ref 0.0–40.0)

## 2019-10-01 LAB — CBC
HCT: 42.5 % (ref 36.0–46.0)
Hemoglobin: 14.6 g/dL (ref 12.0–15.0)
MCHC: 34.4 g/dL (ref 30.0–36.0)
MCV: 89.2 fl (ref 78.0–100.0)
Platelets: 190 10*3/uL (ref 150.0–400.0)
RBC: 4.76 Mil/uL (ref 3.87–5.11)
RDW: 12.9 % (ref 11.5–15.5)
WBC: 7.7 10*3/uL (ref 4.0–10.5)

## 2019-10-01 LAB — HEMOGLOBIN A1C: Hgb A1c MFr Bld: 5.9 % (ref 4.6–6.5)

## 2019-10-01 NOTE — Patient Instructions (Addendum)
miralax with benefiber in a beverage daily to twice daily Hydrate 60-80 ounces a day that are not caffeinated   Increase Allegra to twice a day for a week, add Mucinex plain twice a day, nasal saline flushes in nose twice a day.   Encouraged increased hydration and fiber in diet. Daily probiotics. If bowels not moving can use MOM 2 tbls po in 4 oz of warm prune juice by mouth every 2-3 days. If no results then repeat in 4 hours with  Dulcolax suppository pr, may repeat again in 4 more hours as needed. Seek care if symptoms worsen. Consider daily Miralax and/or Dulcolax if symptoms persist.  Constipation, Adult Constipation is when a person has fewer bowel movements in a week than normal, has difficulty having a bowel movement, or has stools that are dry, hard, or larger than normal. Constipation may be caused by an underlying condition. It may become worse with age if a person takes certain medicines and does not take in enough fluids. Follow these instructions at home: Eating and drinking   Eat foods that have a lot of fiber, such as fresh fruits and vegetables, whole grains, and beans.  Limit foods that are high in fat, low in fiber, or overly processed, such as french fries, hamburgers, cookies, candies, and soda.  Drink enough fluid to keep your urine clear or pale yellow. General instructions  Exercise regularly or as told by your health care provider.  Go to the restroom when you have the urge to go. Do not hold it in.  Take over-the-counter and prescription medicines only as told by your health care provider. These include any fiber supplements.  Practice pelvic floor retraining exercises, such as deep breathing while relaxing the lower abdomen and pelvic floor relaxation during bowel movements.  Watch your condition for any changes.  Keep all follow-up visits as told by your health care provider. This is important. Contact a health care provider if:  You have pain that gets  worse.  You have a fever.  You do not have a bowel movement after 4 days.  You vomit.  You are not hungry.  You lose weight.  You are bleeding from the anus.  You have thin, pencil-like stools. Get help right away if:  You have a fever and your symptoms suddenly get worse.  You leak stool or have blood in your stool.  Your abdomen is bloated.  You have severe pain in your abdomen.  You feel dizzy or you faint. This information is not intended to replace advice given to you by your health care provider. Make sure you discuss any questions you have with your health care provider. Document Revised: 03/15/2017 Document Reviewed: 09/21/2015 Elsevier Patient Education  [employer]. or alcoholic

## 2019-10-08 ENCOUNTER — Telehealth: Payer: Self-pay | Admitting: Family Medicine

## 2019-10-08 ENCOUNTER — Telehealth: Payer: Self-pay | Admitting: Specialist

## 2019-10-08 NOTE — Telephone Encounter (Signed)
Patient called stating that she has been having occular migraines and wants to know if the Meloxicam could be triggering the migraines.  CB#[phone].  Thank you.

## 2019-10-08 NOTE — Telephone Encounter (Signed)
Called pt back- she notes a long history of migraine HA since her 30s. 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  These resolved and she has not had them in a long time- about 35 years  However over the last week she had 3 episodes of vision change/ aura that reminded her of occular migraine she had in the past.  Had one episode today that lasted about 15 minutes.  NOT always assoc with HA this time  She has been seeing DR Otelia Sergeant for an arm injury.   He gave her some meloxicam - she wondered if this was the cause.   I advised pt that I don't think meloxicam is causing these sx.  She may be having TIA . Advised her to go to ER for further evaluation right away.  She states understanding although she is reluctant

## 2019-10-08 NOTE — Telephone Encounter (Signed)
Caller: [name] back phone number: [phone]  Patient states she has had 3 migraines in the past week. Patient states she was started on meloxicam and is wondering if her migraines had to do with medication.

## 2019-10-08 NOTE — Telephone Encounter (Signed)
I called and lmom that she should call her Eye Dr. And ask them what could cause the occular migraines not sure that the meloxicam is causing it

## 2019-10-08 NOTE — Telephone Encounter (Signed)
Please advise in PCP absence.  

## 2019-10-09 DIAGNOSIS — Z8719 Personal history of other diseases of the digestive system: Secondary | ICD-10-CM | POA: Insufficient documentation

## 2019-10-09 HISTORY — DX: Personal history of other diseases of the digestive system: Z87.19

## 2019-10-09 NOTE — Assessment & Plan Note (Signed)
Well controlled, no changes to meds. Encouraged heart healthy diet such as the DASH diet and exercise as tolerated.  °

## 2019-10-09 NOTE — Progress Notes (Signed)
Subjective:    Patient ID: Kristina Lee, female    DOB: [birth year], 74 y.o.   MRN: [id number]  Chief Complaint  Patient presents with  . throat irritation  . Allergies    HPI Patient is in today for follow up on chronic medical concerns. She is noting a sore throat over the last couple of days. No headache or fevers. Well controlled, no changes to meds. Encouraged heart healthy diet such as the DASH diet and exercise as tolerated.   Past Medical History:  Diagnosis Date  . Diabetes mellitus without complication ([place])   . Heart attack ([place]) 09/07/2017  . Urticaria     Past Surgical History:  Procedure Laterality Date  . ABDOMINAL HYSTERECTOMY  2006   b/l SPO and TAH, uterine cancer  . EYE SURGERY Right 1960  . TUBAL LIGATION      Family History  Problem Relation Age of Onset  . Cancer Mother        liver cancer, breast cancer x 2   . Alcohol abuse Mother        smoker  . Heart disease Father        MI  . Alcohol abuse Father        smoker  . Depression Sister   . Tremor Sister   . Cancer Brother        pancreatic  . Healthy Son   . Diabetes Maternal Grandmother   . Hearing loss Maternal Grandmother   . Depression Maternal Grandmother   . Allergic rhinitis Neg Hx   . Angioedema Neg Hx   . Asthma Neg Hx   . Eczema Neg Hx   . Immunodeficiency Neg Hx   . Urticaria Neg Hx     Social History   Socioeconomic History  . Marital status: Married    Spouse name: Not on file  . Number of children: Not on file  . Years of education: Not on file  . Highest education level: Not on file  Occupational History  . Not on file  Tobacco Use  . Smoking status: Former Smoker    Packs/day: 0.50    Years: 24.00    Pack years: 12.00    Types: Cigarettes    Quit date: 04/17/1987    Years since quitting: 32.5  . Smokeless tobacco: Never Used  Vaping Use  . Vaping Use: Never used  Substance and Sexual Activity  . Alcohol use: Yes    Comment: occ  . Drug use: Not  Currently  . Sexual activity: Yes  Other Topics Concern  . Not on file  Social History Narrative  . Not on file   Social Determinants of Health   Financial Resource Strain: Low Risk   . Difficulty of Paying Living Expenses: Not hard at all  Food Insecurity: No Food Insecurity  . Worried About Charity fundraiser in the Last Year: Never true  . Ran Out of Food in the Last Year: Never true  Transportation Needs: No Transportation Needs  . Lack of Transportation (Medical): No  . Lack of Transportation (Non-Medical): No  Physical Activity:   . Days of Exercise per Week:   . Minutes of Exercise per Session:   Stress:   . Feeling of Stress :   Social Connections:   . Frequency of Communication with Friends and Family:   . Frequency of Social Gatherings with Friends and Family:   . Attends Religious Services:   . Active Member of Clubs  or Organizations:   . Attends Archivist Meetings:   Marland Kitchen Marital Status:   Intimate Partner Violence:   . Fear of Current or Ex-Partner:   . Emotionally Abused:   Marland Kitchen Physically Abused:   . Sexually Abused:     Outpatient Medications Prior to Visit  Medication Sig Dispense Refill  . Accu-Chek Softclix Lancets lancets Use as instructed 100 each 12  . Acetaminophen (TYLENOL PO) Take by mouth as needed.    Marland Kitchen aspirin EC 81 MG tablet Take 81 mg by mouth daily.    Marland Kitchen atorvastatin (LIPITOR) 80 MG tablet TAKE 1 TABLET BY MOUTH EVERY DAY 90 tablet 1  . augmented betamethasone dipropionate (DIPROLENE-AF) 0.05 % cream Apply topically daily.    . blood glucose meter kit and supplies KIT Dispense based on patient and insurance preference. Check blood sugars tid prn Dx; E11.9 1 each 0  . Calcium Carbonate-Vit D-Min (CALCIUM 600+D PLUS MINERALS) 600-400 MG-UNIT TABS Take 2 tablets by mouth daily.     . clopidogrel (PLAVIX) 75 MG tablet Take 1 tablet (75 mg total) by mouth daily. 90 tablet 3  . diclofenac sodium (VOLTAREN) 1 % GEL APPLY 4 G TOPICALLY 4  (FOUR) TIMES DAILY AS NEEDED FOR PAIN 500 g 1  . fexofenadine (ALLEGRA) 180 MG tablet Take 1 tablet (180 mg total) by mouth 2 (two) times daily. (Patient taking differently: Take 180 mg by mouth as needed. ) 180 tablet 1  . fluticasone (FLONASE) 50 MCG/ACT nasal spray Place 2 sprays into both nostrils daily. (Patient taking differently: Place 2 sprays into both nostrils as needed. ) 48 g 1  . glucose blood (COOL BLOOD GLUCOSE TEST STRIPS) test strip Use as instructedDispense based on patient and insurance preference. Check blood sugars tid prn Dx; E11.9 100 each 12  . Lancets MISC Dispense based on patient and insurance preference. Check blood sugars tid prn Dx; E11.9 100 each 3  . lisinopril (ZESTRIL) 5 MG tablet Take 1 tablet (5 mg total) by mouth 2 (two) times daily. 180 tablet 1  . meloxicam (MOBIC) 15 MG tablet Take 1 tablet (15 mg total) by mouth daily. 30 tablet 2  . OVER THE COUNTER MEDICATION as needed.    . polyethylene glycol (MIRALAX / GLYCOLAX) 17 g packet Take 17 g by mouth as needed.    . traMADol-acetaminophen (ULTRACET) 37.5-325 MG tablet Take 1 tablet by mouth every 6 (six) hours as needed. 30 tablet 0  . methylPREDNISolone (MEDROL) 4 MG tablet Take 1 tablet (4 mg total) by mouth daily for 14 days, THEN 0.5 tablets (2 mg total) daily for 14 days. 21 tablet 0   No facility-administered medications prior to visit.    Allergies  Allergen Reactions  . Amoxicillin Anaphylaxis  . Hydroxyzine Anaphylaxis  . Sulfa Antibiotics Swelling    Other reaction(s): Swelling tongue tongue tongue     Review of Systems  Constitutional: Negative for fever and malaise/fatigue.  HENT: Positive for congestion and sore throat.   Eyes: Negative for blurred vision.  Respiratory: Negative for shortness of breath.   Cardiovascular: Negative for chest pain, palpitations and leg swelling.  Gastrointestinal: Negative for abdominal pain, blood in stool and nausea.  Genitourinary: Negative for  dysuria and frequency.  Musculoskeletal: Negative for falls.  Skin: Negative for rash.  Neurological: Negative for dizziness, loss of consciousness and headaches.  Endo/Heme/Allergies: Negative for environmental allergies.  Psychiatric/Behavioral: Negative for depression. The patient is not nervous/anxious.  Objective:    Physical Exam Vitals and nursing note reviewed.  Constitutional:      General: She is not in acute distress.    Appearance: She is well-developed.  HENT:     Head: Normocephalic and atraumatic.     Nose: Nose normal.  Eyes:     General:        Right eye: No discharge.        Left eye: No discharge.  Cardiovascular:     Rate and Rhythm: Normal rate and regular rhythm.     Heart sounds: No murmur heard.   Pulmonary:     Effort: Pulmonary effort is normal.     Breath sounds: Normal breath sounds.  Abdominal:     General: Bowel sounds are normal.     Palpations: Abdomen is soft.     Tenderness: There is no abdominal tenderness.  Musculoskeletal:     Cervical back: Normal range of motion and neck supple.  Skin:    General: Skin is warm and dry.  Neurological:     Mental Status: She is alert and oriented to person, place, and time.     BP 130/80 (BP Location: Left Arm, Cuff Size: Normal)   Pulse (!) 58   Temp (!) 97.4 F (36.3 C) (Temporal)   Resp 12   Ht 5' 2"  (1.575 m)   Wt 140 lb (63.5 kg)   LMP  (LMP Unknown)   SpO2 97%   BMI 25.61 kg/m  Wt Readings from Last 3 Encounters:  10/01/19 140 lb (63.5 kg)  09/23/19 131 lb (59.4 kg)  08/20/19 131 lb (59.4 kg)    Diabetic Foot Exam - Simple   No data filed     Lab Results  Component Value Date   WBC 7.7 10/01/2019   HGB 14.6 10/01/2019   HCT 42.5 10/01/2019   PLT 190.0 10/01/2019   GLUCOSE 106 (H) 10/01/2019   CHOL 108 10/01/2019   TRIG 84.0 10/01/2019   HDL 47.50 10/01/2019   LDLCALC 44 10/01/2019   ALT 14 10/01/2019   AST 20 10/01/2019   NA 141 10/01/2019   K 3.8 10/01/2019    CL 105 10/01/2019   CREATININE 0.90 10/01/2019   BUN 19 10/01/2019   CO2 30 10/01/2019   TSH 1.03 10/01/2019   HGBA1C 5.9 10/01/2019    Lab Results  Component Value Date   TSH 1.03 10/01/2019   Lab Results  Component Value Date   WBC 7.7 10/01/2019   HGB 14.6 10/01/2019   HCT 42.5 10/01/2019   MCV 89.2 10/01/2019   PLT 190.0 10/01/2019   Lab Results  Component Value Date   NA 141 10/01/2019   K 3.8 10/01/2019   CO2 30 10/01/2019   GLUCOSE 106 (H) 10/01/2019   BUN 19 10/01/2019   CREATININE 0.90 10/01/2019   BILITOT 2.0 (H) 10/01/2019   ALKPHOS 60 10/01/2019   AST 20 10/01/2019   ALT 14 10/01/2019   PROT 6.6 10/01/2019   ALBUMIN 4.3 10/01/2019   CALCIUM 9.9 10/01/2019   ANIONGAP 11 03/12/2018   GFR 61.19 10/01/2019   Lab Results  Component Value Date   CHOL 108 10/01/2019   Lab Results  Component Value Date   HDL 47.50 10/01/2019   Lab Results  Component Value Date   LDLCALC 44 10/01/2019   Lab Results  Component Value Date   TRIG 84.0 10/01/2019   Lab Results  Component Value Date   CHOLHDL 2 10/01/2019   Lab  Results  Component Value Date   HGBA1C 5.9 10/01/2019       Assessment & Plan:   Problem List Items Addressed This Visit    Essential hypertension    Well controlled, no changes to meds. Encouraged heart healthy diet such as the DASH diet and exercise as tolerated.       Relevant Orders   CBC (Completed)   Comprehensive metabolic panel (Completed)   TSH (Completed)   Gastroesophageal reflux disease with esophagitis    Avoid offending foods, start probiotics. Do not eat large meals in late evening and consider raising head of bed.       Hyperlipidemia associated with type 2 diabetes mellitus ([place])   Relevant Orders   Lipid panel (Completed)   Allergic urticaria    Symptomatically much better.       Diabetes mellitus ([place])   Relevant Orders   Hemoglobin A1c (Completed)   Comp Met (CMET)   Seasonal allergic rhinitis due to  pollen - Primary   Relevant Orders   Ambulatory referral to ENT   Sore throat    Recurrent and likely multifactorial. Treat allergies and reflux and referred to ENT for further evaluation.       Relevant Orders   Ambulatory referral to ENT      I have discontinued Charlett Nose A. Whitcher "Judy"'s traMADol-acetaminophen and methylPREDNISolone. I am also having her maintain her aspirin EC, Calcium 600+D Plus Minerals, diclofenac sodium, atorvastatin, fexofenadine, fluticasone, blood glucose meter kit and supplies, Cool Blood Glucose Test Strips, Lancets, clopidogrel, Accu-Chek Softclix Lancets, Acetaminophen (TYLENOL PO), OVER THE COUNTER MEDICATION, polyethylene glycol, augmented betamethasone dipropionate, lisinopril, and meloxicam.  No orders of the defined types were placed in this encounter.    Penni Homans, MD

## 2019-10-09 NOTE — Assessment & Plan Note (Signed)
Avoid offending foods, start probiotics. Do not eat large meals in late evening and consider raising head of bed.  

## 2019-10-09 NOTE — Assessment & Plan Note (Signed)
Recurrent and likely multifactorial. Treat allergies and reflux and referred to ENT for further evaluation.

## 2019-10-09 NOTE — Assessment & Plan Note (Signed)
Symptomatically much better.

## 2019-10-15 ENCOUNTER — Encounter (INDEPENDENT_AMBULATORY_CARE_PROVIDER_SITE_OTHER): Payer: Self-pay

## 2019-10-15 ENCOUNTER — Ambulatory Visit (INDEPENDENT_AMBULATORY_CARE_PROVIDER_SITE_OTHER): Payer: Medicare PPO | Admitting: Otolaryngology

## 2019-10-15 ENCOUNTER — Other Ambulatory Visit: Payer: Self-pay

## 2019-10-15 ENCOUNTER — Encounter (INDEPENDENT_AMBULATORY_CARE_PROVIDER_SITE_OTHER): Payer: Self-pay | Admitting: Otolaryngology

## 2019-10-15 VITALS — Temp 97.5°F

## 2019-10-15 DIAGNOSIS — H903 Sensorineural hearing loss, bilateral: Secondary | ICD-10-CM | POA: Diagnosis not present

## 2019-10-15 DIAGNOSIS — J310 Chronic rhinitis: Secondary | ICD-10-CM | POA: Diagnosis not present

## 2019-10-15 NOTE — Progress Notes (Signed)
HPI: Kristina Lee is a 74 y.o. female who presents is referred by Dr. Charlett Blake For evaluation of nasal complaints and throat complaints.  She complains that her left nostril leaks mucus all the time where she has to use Kleenex's throughout the day and blow her nose.  She has also noted some bumps in her throat that come and go and she occasionally gets sore throat.  Patient also sneezes and coughs throughout the day. Sometimes the coughing wakes her up at night.. She has previously seen an allergist several years ago and had testing that she stated showed only normal allergy test. I reviewed a CT scan she had of her head performed in November 2019 that showed clear paranasal sinuses with aplastic right frontal sinus and small clear left frontal sinus. She is presently using Flonase which she does not feel like helps that much.  She also uses Allegra and she has several over-the-counter cough medications that she takes as needed.  Past Medical History:  Diagnosis Date  . Diabetes mellitus without complication ([place])   . Heart attack ([place]) 09/07/2017  . Urticaria    Past Surgical History:  Procedure Laterality Date  . ABDOMINAL HYSTERECTOMY  2006   b/l SPO and TAH, uterine cancer  . EYE SURGERY Right 1960  . TUBAL LIGATION     Social History   Socioeconomic History  . Marital status: Married    Spouse name: Not on file  . Number of children: Not on file  . Years of education: Not on file  . Highest education level: Not on file  Occupational History  . Not on file  Tobacco Use  . Smoking status: Former Smoker    Packs/day: 0.50    Years: 24.00    Pack years: 12.00    Types: Cigarettes    Quit date: 04/17/1987    Years since quitting: 32.5  . Smokeless tobacco: Never Used  Vaping Use  . Vaping Use: Never used  Substance and Sexual Activity  . Alcohol use: Yes    Comment: occ  . Drug use: Not Currently  . Sexual activity: Yes  Other Topics Concern  . Not on file  Social  History Narrative  . Not on file   Social Determinants of Health   Financial Resource Strain: Low Risk   . Difficulty of Paying Living Expenses: Not hard at all  Food Insecurity: No Food Insecurity  . Worried About Charity fundraiser in the Last Year: Never true  . Ran Out of Food in the Last Year: Never true  Transportation Needs: No Transportation Needs  . Lack of Transportation (Medical): No  . Lack of Transportation (Non-Medical): No  Physical Activity:   . Days of Exercise per Week:   . Minutes of Exercise per Session:   Stress:   . Feeling of Stress :   Social Connections:   . Frequency of Communication with Friends and Family:   . Frequency of Social Gatherings with Friends and Family:   . Attends Religious Services:   . Active Member of Clubs or Organizations:   . Attends Archivist Meetings:   Marland Kitchen Marital Status:    Family History  Problem Relation Age of Onset  . Cancer Mother        liver cancer, breast cancer x 2   . Alcohol abuse Mother        smoker  . Heart disease Father        MI  . Alcohol  abuse Father        smoker  . Depression Sister   . Tremor Sister   . Cancer Brother        pancreatic  . Healthy Son   . Diabetes Maternal Grandmother   . Hearing loss Maternal Grandmother   . Depression Maternal Grandmother   . Allergic rhinitis Neg Hx   . Angioedema Neg Hx   . Asthma Neg Hx   . Eczema Neg Hx   . Immunodeficiency Neg Hx   . Urticaria Neg Hx    Allergies  Allergen Reactions  . Amoxicillin Anaphylaxis  . Hydroxyzine Anaphylaxis  . Sulfa Antibiotics Swelling    Other reaction(s): Swelling tongue tongue tongue    Prior to Admission medications   Medication Sig Start Date End Date Taking? Authorizing Provider  Accu-Chek Softclix Lancets lancets Use as instructed 05/27/19  Yes Mosie Lukes, MD  Acetaminophen (TYLENOL PO) Take by mouth as needed.   Yes [provider]  aspirin EC 81 MG tablet Take 81 mg by mouth  daily.   Yes [provider]  atorvastatin (LIPITOR) 80 MG tablet TAKE 1 TABLET BY MOUTH EVERY DAY 04/06/19  Yes Mosie Lukes, MD  augmented betamethasone dipropionate (DIPROLENE-AF) 0.05 % cream Apply topically daily.   Yes [provider]  blood glucose meter kit and supplies KIT Dispense based on patient and insurance preference. Check blood sugars tid prn Dx; E11.9 05/18/19  Yes Mosie Lukes, MD  Calcium Carbonate-Vit D-Min (CALCIUM 600+D PLUS MINERALS) 600-400 MG-UNIT TABS Take 2 tablets by mouth daily.  09/13/15  Yes [provider]  clopidogrel (PLAVIX) 75 MG tablet Take 1 tablet (75 mg total) by mouth daily. 05/26/19  Yes Mosie Lukes, MD  diclofenac sodium (VOLTAREN) 1 % GEL APPLY 4 G TOPICALLY 4 (FOUR) TIMES DAILY AS NEEDED FOR PAIN 06/12/18  Yes Hudnall, Sharyn Lull, MD  fexofenadine (ALLEGRA) 180 MG tablet Take 1 tablet (180 mg total) by mouth 2 (two) times daily. Patient taking differently: Take 180 mg by mouth as needed.  05/01/19  Yes Mosie Lukes, MD  fluticasone (FLONASE) 50 MCG/ACT nasal spray Place 2 sprays into both nostrils daily. Patient taking differently: Place 2 sprays into both nostrils as needed.  05/01/19  Yes Mosie Lukes, MD  glucose blood (COOL BLOOD GLUCOSE TEST STRIPS) test strip Use as instructedDispense based on patient and insurance preference. Check blood sugars tid prn Dx; E11.9 05/18/19  Yes Mosie Lukes, MD  Lancets MISC Dispense based on patient and insurance preference. Check blood sugars tid prn Dx; E11.9 05/18/19  Yes Mosie Lukes, MD  lisinopril (ZESTRIL) 5 MG tablet Take 1 tablet (5 mg total) by mouth 2 (two) times daily. 07/21/19  Yes Mosie Lukes, MD  meloxicam (MOBIC) 15 MG tablet Take 1 tablet (15 mg total) by mouth daily. 09/23/19 09/22/20 Yes Jessy Oto, MD  OVER THE COUNTER MEDICATION as needed.   Yes [provider]  polyethylene glycol (MIRALAX / GLYCOLAX) 17 g packet Take 17 g by mouth as needed.   Yes  [provider]     Positive ROS: Otherwise negative  All other systems have been reviewed and were otherwise negative with the exception of those mentioned in the HPI and as above.  Physical Exam: Constitutional: Alert, well-appearing, no acute distress Ears: External ears without lesions or tenderness. Ear canals are clear bilaterally with intact, clear TMs bilaterally.  On tuning fork testing she has moderate  hearing loss in both ears worse on the right but AC > BC bilaterally. Nasal: External nose without lesions. Septum mildly deviated to the right..  After decongesting the nose with Afrin both middle meatus regions were clear with no polyps and no evidence of mucopurulent discharge.  Mucus within the nasal cavity is clear. Oral: Lips and gums without lesions. Tongue and palate mucosa without lesions. Posterior oropharynx clear.  Patient is status post tonsillectomy.  No clinical evidence of mucosal abnormalities or infection or neoplasm.  The mucosal bumps that she demonstrates appear benign.  Indirect laryngoscopy revealed a clear base of tongue vallecula epiglottis and hypopharynx. Neck: No palpable adenopathy or masses. Respiratory: Breathing comfortably  Skin: No facial/neck lesions or rash noted.  Procedures  Assessment: Chronic rhinitis. Normal oropharyngeal examination with no evidence of neoplasm or infection. Sensorineural hearing loss.  Plan: Concerning the nasal sinus symptoms that she is experiencing reviewed with her concerning regular use of nasal steroid spray and suggested trying Nasacort 2 sprays each night to compared to the Flonase that she is presently using. Also prescribed azelastine 1 spray twice daily as this may help some with the nasal drainage. Also reviewed with her concerning use of saline nasal irrigation during the daytime on a as needed basis as this will help clear the mucus as well as dryness of the mucous membranes. Discussed with her that  that she would probably benefit from use of hearing aids and would recommend obtaining audiologic testing and possible hearing aids as she has moderate sensorineural hearing loss on screening with tuning forks.   Radene Journey, MD   CC:

## 2019-10-15 NOTE — Progress Notes (Unsigned)
Pt loss Rx fron Dr. Ezzard Standing. I called Rx's into [facility]. Nasacort, 2 sprays each nostril at night. W/6refills. Also, Azelastine .15%, 1 sray each nostril  BID, w/6refills.

## 2019-12-29 ENCOUNTER — Other Ambulatory Visit: Payer: Medicare PPO

## 2019-12-29 ENCOUNTER — Other Ambulatory Visit: Payer: Self-pay

## 2019-12-29 DIAGNOSIS — Z20822 Contact with and (suspected) exposure to covid-19: Secondary | ICD-10-CM | POA: Diagnosis not present

## 2019-12-31 ENCOUNTER — Other Ambulatory Visit: Payer: Self-pay | Admitting: Family Medicine

## 2019-12-31 LAB — SARS-COV-2, NAA 2 DAY TAT

## 2019-12-31 LAB — NOVEL CORONAVIRUS, NAA: SARS-CoV-2, NAA: NOT DETECTED

## 2020-01-04 ENCOUNTER — Other Ambulatory Visit: Payer: Self-pay

## 2020-01-04 ENCOUNTER — Telehealth (INDEPENDENT_AMBULATORY_CARE_PROVIDER_SITE_OTHER): Payer: Medicare PPO | Admitting: Family Medicine

## 2020-01-04 DIAGNOSIS — I10 Essential (primary) hypertension: Secondary | ICD-10-CM | POA: Diagnosis not present

## 2020-01-04 DIAGNOSIS — E785 Hyperlipidemia, unspecified: Secondary | ICD-10-CM | POA: Diagnosis not present

## 2020-01-04 DIAGNOSIS — E1169 Type 2 diabetes mellitus with other specified complication: Secondary | ICD-10-CM

## 2020-01-04 DIAGNOSIS — N183 Chronic kidney disease, stage 3 unspecified: Secondary | ICD-10-CM | POA: Diagnosis not present

## 2020-01-04 NOTE — Progress Notes (Signed)
Subjective:    Patient ID: Kristina Lee, female    DOB: [birth year], 74 y.o.   MRN: [id number] Virtual Visit via Video Note  I connected with Kristina Lee on 01/04/20 at  3:20 PM EDT by a video enabled telemedicine application and verified that I am speaking with the correct person using two identifiers.  Location: Patient: home, patient and provider were in visit Provider: office   I discussed the limitations of evaluation and management by telemedicine and the availability of in person appointments. The patient expressed understanding and agreed to proceed. Nani Skillern, CMA was able to get the patient set up on a video visit .sba    Chief Complaint  Patient presents with  . Follow-up    HPI Patient is in today for follow up on chronic medical concerns. No recent febrile illness or hospitalizations. No polyuria or polydipsia. No acute concerns. Continues to maintain social distancing and a heart healthy diet. Denies CP/palp/SOB/HA/congestion/fevers/GI or GU c/o. Taking meds as prescribed  Past Medical History:  Diagnosis Date  . Diabetes mellitus without complication ([facility])   . Heart attack ([facility]) 09/07/2017  . Urticaria     Past Surgical History:  Procedure Laterality Date  . ABDOMINAL HYSTERECTOMY  2006   b/l SPO and TAH, uterine cancer  . EYE SURGERY Right 1960  . TUBAL LIGATION      Family History  Problem Relation Age of Onset  . Cancer Mother        liver cancer, breast cancer x 2   . Alcohol abuse Mother        smoker  . Heart disease Father        MI  . Alcohol abuse Father        smoker  . Depression Sister   . Tremor Sister   . Cancer Brother        pancreatic  . Healthy Son   . Diabetes Maternal Grandmother   . Hearing loss Maternal Grandmother   . Depression Maternal Grandmother   . Allergic rhinitis Neg Hx   . Angioedema Neg Hx   . Asthma Neg Hx   . Eczema Neg Hx   . Immunodeficiency Neg Hx   . Urticaria Neg Hx     Social History    Socioeconomic History  . Marital status: Married    Spouse name: Not on file  . Number of children: Not on file  . Years of education: Not on file  . Highest education level: Not on file  Occupational History  . Not on file  Tobacco Use  . Smoking status: Former Smoker    Packs/day: 0.50    Years: 24.00    Pack years: 12.00    Types: Cigarettes    Quit date: 04/17/1987    Years since quitting: 32.7  . Smokeless tobacco: Never Used  Vaping Use  . Vaping Use: Never used  Substance and Sexual Activity  . Alcohol use: Yes    Comment: occ  . Drug use: Not Currently  . Sexual activity: Yes  Other Topics Concern  . Not on file  Social History Narrative  . Not on file   Social Determinants of Health   Financial Resource Strain: Low Risk   . Difficulty of Paying Living Expenses: Not hard at all  Food Insecurity: No Food Insecurity  . Worried About Charity fundraiser in the Last Year: Never true  . Ran Out of Food in the Last Year: Never true  Transportation Needs: No Transportation Needs  . Lack of Transportation (Medical): No  . Lack of Transportation (Non-Medical): No  Physical Activity:   . Days of Exercise per Week: Not on file  . Minutes of Exercise per Session: Not on file  Stress:   . Feeling of Stress : Not on file  Social Connections:   . Frequency of Communication with Friends and Family: Not on file  . Frequency of Social Gatherings with Friends and Family: Not on file  . Attends Religious Services: Not on file  . Active Member of Clubs or Organizations: Not on file  . Attends Archivist Meetings: Not on file  . Marital Status: Not on file  Intimate Partner Violence:   . Fear of Current or Ex-Partner: Not on file  . Emotionally Abused: Not on file  . Physically Abused: Not on file  . Sexually Abused: Not on file    Outpatient Medications Prior to Visit  Medication Sig Dispense Refill  . Accu-Chek Softclix Lancets lancets Use as instructed 100  each 12  . Acetaminophen (TYLENOL PO) Take by mouth as needed.    Marland Kitchen aspirin EC 81 MG tablet Take 81 mg by mouth daily.    Marland Kitchen atorvastatin (LIPITOR) 80 MG tablet TAKE 1 TABLET BY MOUTH EVERY DAY 90 tablet 1  . augmented betamethasone dipropionate (DIPROLENE-AF) 0.05 % cream Apply topically daily.    . blood glucose meter kit and supplies KIT Dispense based on patient and insurance preference. Check blood sugars tid prn Dx; E11.9 1 each 0  . Calcium Carbonate-Vit D-Min (CALCIUM 600+D PLUS MINERALS) 600-400 MG-UNIT TABS Take 2 tablets by mouth daily.     . clopidogrel (PLAVIX) 75 MG tablet Take 1 tablet (75 mg total) by mouth daily. 90 tablet 3  . diclofenac sodium (VOLTAREN) 1 % GEL APPLY 4 G TOPICALLY 4 (FOUR) TIMES DAILY AS NEEDED FOR PAIN 500 g 1  . fexofenadine (ALLEGRA) 180 MG tablet Take 1 tablet (180 mg total) by mouth 2 (two) times daily. (Patient taking differently: Take 180 mg by mouth as needed. ) 180 tablet 1  . fluticasone (FLONASE) 50 MCG/ACT nasal spray Place 2 sprays into both nostrils daily. (Patient taking differently: Place 2 sprays into both nostrils as needed. ) 48 g 1  . glucose blood (COOL BLOOD GLUCOSE TEST STRIPS) test strip Use as instructedDispense based on patient and insurance preference. Check blood sugars tid prn Dx; E11.9 100 each 12  . Lancets MISC Dispense based on patient and insurance preference. Check blood sugars tid prn Dx; E11.9 100 each 3  . lisinopril (ZESTRIL) 5 MG tablet Take 1 tablet (5 mg total) by mouth 2 (two) times daily. 180 tablet 1  . meloxicam (MOBIC) 15 MG tablet Take 1 tablet (15 mg total) by mouth daily. 30 tablet 2  . OVER THE COUNTER MEDICATION as needed.    . polyethylene glycol (MIRALAX / GLYCOLAX) 17 g packet Take 17 g by mouth as needed.     No facility-administered medications prior to visit.    Allergies  Allergen Reactions  . Amoxicillin Anaphylaxis  . Hydroxyzine Anaphylaxis  . Sulfa Antibiotics Swelling    Other reaction(s):  Swelling tongue tongue tongue     Review of Systems  Constitutional: Negative for fever and malaise/fatigue.  HENT: Negative for congestion.   Eyes: Negative for blurred vision.  Respiratory: Negative for shortness of breath.   Cardiovascular: Negative for chest pain, palpitations and leg swelling.  Gastrointestinal: Negative for abdominal  pain, blood in stool and nausea.  Genitourinary: Negative for dysuria and frequency.  Musculoskeletal: Negative for falls.  Skin: Negative for rash.  Neurological: Negative for dizziness, loss of consciousness and headaches.  Endo/Heme/Allergies: Negative for environmental allergies.  Psychiatric/Behavioral: Negative for depression. The patient is not nervous/anxious.        Objective:    Physical Exam Constitutional:      Appearance: Normal appearance. She is obese. She is not ill-appearing.  HENT:     Head: Normocephalic and atraumatic.     Right Ear: External ear normal.     Left Ear: External ear normal.     Nose: Nose normal.  Eyes:     General:        Right eye: No discharge.        Left eye: No discharge.  Pulmonary:     Effort: Pulmonary effort is normal.  Neurological:     Mental Status: She is alert and oriented to person, place, and time.  Psychiatric:        Behavior: Behavior normal.     LMP  (LMP Unknown)  Wt Readings from Last 3 Encounters:  10/01/19 140 lb (63.5 kg)  09/23/19 131 lb (59.4 kg)  08/20/19 131 lb (59.4 kg)    Diabetic Foot Exam - Simple   No data filed     Lab Results  Component Value Date   WBC 7.7 10/01/2019   HGB 14.6 10/01/2019   HCT 42.5 10/01/2019   PLT 190.0 10/01/2019   GLUCOSE 106 (H) 10/01/2019   CHOL 108 10/01/2019   TRIG 84.0 10/01/2019   HDL 47.50 10/01/2019   LDLCALC 44 10/01/2019   ALT 14 10/01/2019   AST 20 10/01/2019   NA 141 10/01/2019   K 3.8 10/01/2019   CL 105 10/01/2019   CREATININE 0.90 10/01/2019   BUN 19 10/01/2019   CO2 30 10/01/2019   TSH 1.03  10/01/2019   HGBA1C 5.9 10/01/2019    Lab Results  Component Value Date   TSH 1.03 10/01/2019   Lab Results  Component Value Date   WBC 7.7 10/01/2019   HGB 14.6 10/01/2019   HCT 42.5 10/01/2019   MCV 89.2 10/01/2019   PLT 190.0 10/01/2019   Lab Results  Component Value Date   NA 141 10/01/2019   K 3.8 10/01/2019   CO2 30 10/01/2019   GLUCOSE 106 (H) 10/01/2019   BUN 19 10/01/2019   CREATININE 0.90 10/01/2019   BILITOT 2.0 (H) 10/01/2019   ALKPHOS 60 10/01/2019   AST 20 10/01/2019   ALT 14 10/01/2019   PROT 6.6 10/01/2019   ALBUMIN 4.3 10/01/2019   CALCIUM 9.9 10/01/2019   ANIONGAP 11 03/12/2018   GFR 61.19 10/01/2019   Lab Results  Component Value Date   CHOL 108 10/01/2019   Lab Results  Component Value Date   HDL 47.50 10/01/2019   Lab Results  Component Value Date   LDLCALC 44 10/01/2019   Lab Results  Component Value Date   TRIG 84.0 10/01/2019   Lab Results  Component Value Date   CHOLHDL 2 10/01/2019   Lab Results  Component Value Date   HGBA1C 5.9 10/01/2019       Assessment & Plan:   Problem List Items Addressed This Visit    Chronic kidney disease, stage III (moderate)    Hydrate and monitor      Essential hypertension - Primary    Well controlled, no changes to meds. Encouraged heart healthy diet  such as the DASH diet and exercise as tolerated.       Relevant Orders   CBC   Comprehensive metabolic panel   TSH   Hyperlipidemia associated with type 2 diabetes mellitus ([facility])   Relevant Orders   Lipid panel   Diabetes mellitus ([facility])    hgba1c acceptable, minimize simple carbs. Increase exercise as tolerated. Continue current meds      Relevant Orders   Hemoglobin A1c      I am having Charlett Nose A. Smet "Bethena Roys" maintain her aspirin EC, Calcium 600+D Plus Minerals, diclofenac sodium, fexofenadine, fluticasone, blood glucose meter kit and supplies, Cool Blood Glucose Test Strips, Lancets, clopidogrel, Accu-Chek Softclix Lancets,  Acetaminophen (TYLENOL PO), OVER THE COUNTER MEDICATION, polyethylene glycol, augmented betamethasone dipropionate, lisinopril, meloxicam, and atorvastatin.  No orders of the defined types were placed in this encounter.    I discussed the assessment and treatment plan with the patient. The patient was provided an opportunity to ask questions and all were answered. The patient agreed with the plan and demonstrated an understanding of the instructions.   The patient was advised to call back or seek an in-person evaluation if the symptoms worsen or if the condition fails to improve as anticipated.  I provided 20 minutes of non-face-to-face time during this encounter.   Penni Homans, MD

## 2020-01-04 NOTE — Assessment & Plan Note (Signed)
hgba1c acceptable, minimize simple carbs. Increase exercise as tolerated. Continue current meds 

## 2020-01-04 NOTE — Assessment & Plan Note (Signed)
Well controlled, no changes to meds. Encouraged heart healthy diet such as the DASH diet and exercise as tolerated.  °

## 2020-01-04 NOTE — Assessment & Plan Note (Signed)
Hydrate and monitor 

## 2020-01-07 ENCOUNTER — Telehealth: Payer: Self-pay | Admitting: *Deleted

## 2020-01-07 NOTE — Telephone Encounter (Signed)
Patient will be calling back to make lab only appt soon and nurse appt for flu shot.  Also needs nurse visit for husband for flu shot.

## 2020-01-22 ENCOUNTER — Other Ambulatory Visit: Payer: Self-pay

## 2020-01-22 ENCOUNTER — Other Ambulatory Visit: Payer: Medicare PPO

## 2020-01-22 ENCOUNTER — Ambulatory Visit (INDEPENDENT_AMBULATORY_CARE_PROVIDER_SITE_OTHER): Payer: Medicare PPO

## 2020-01-22 DIAGNOSIS — E785 Hyperlipidemia, unspecified: Secondary | ICD-10-CM

## 2020-01-22 DIAGNOSIS — Z23 Encounter for immunization: Secondary | ICD-10-CM | POA: Diagnosis not present

## 2020-01-22 DIAGNOSIS — I10 Essential (primary) hypertension: Secondary | ICD-10-CM | POA: Diagnosis not present

## 2020-01-22 DIAGNOSIS — E1169 Type 2 diabetes mellitus with other specified complication: Secondary | ICD-10-CM | POA: Diagnosis not present

## 2020-01-23 LAB — TSH: TSH: 1.12 mIU/L (ref 0.40–4.50)

## 2020-01-23 LAB — COMPREHENSIVE METABOLIC PANEL
AG Ratio: 1.9 (calc) (ref 1.0–2.5)
ALT: 13 U/L (ref 6–29)
AST: 21 U/L (ref 10–35)
Albumin: 4.1 g/dL (ref 3.6–5.1)
Alkaline phosphatase (APISO): 67 U/L (ref 37–153)
BUN: 16 mg/dL (ref 7–25)
CO2: 28 mmol/L (ref 20–32)
Calcium: 9.5 mg/dL (ref 8.6–10.4)
Chloride: 106 mmol/L (ref 98–110)
Creat: 0.91 mg/dL (ref 0.60–0.93)
Globulin: 2.2 g/dL (calc) (ref 1.9–3.7)
Glucose, Bld: 109 mg/dL — ABNORMAL HIGH (ref 65–99)
Potassium: 4.3 mmol/L (ref 3.5–5.3)
Sodium: 142 mmol/L (ref 135–146)
Total Bilirubin: 1.5 mg/dL — ABNORMAL HIGH (ref 0.2–1.2)
Total Protein: 6.3 g/dL (ref 6.1–8.1)

## 2020-01-23 LAB — CBC
HCT: 42.7 % (ref 35.0–45.0)
Hemoglobin: 14.7 g/dL (ref 11.7–15.5)
MCH: 30.2 pg (ref 27.0–33.0)
MCHC: 34.4 g/dL (ref 32.0–36.0)
MCV: 87.9 fL (ref 80.0–100.0)
MPV: 10 fL (ref 7.5–12.5)
Platelets: 195 10*3/uL (ref 140–400)
RBC: 4.86 10*6/uL (ref 3.80–5.10)
RDW: 12.3 % (ref 11.0–15.0)
WBC: 6.4 10*3/uL (ref 3.8–10.8)

## 2020-01-23 LAB — HEMOGLOBIN A1C
Hgb A1c MFr Bld: 6 % of total Hgb — ABNORMAL HIGH (ref <5.7)
Mean Plasma Glucose: 126 (calc)
eAG (mmol/L): 7 (calc)

## 2020-01-23 LAB — LIPID PANEL
Cholesterol: 104 mg/dL (ref <200)
HDL: 46 mg/dL — ABNORMAL LOW (ref >=50)
LDL Cholesterol (Calc): 43 mg/dL (calc)
Non-HDL Cholesterol (Calc): 58 mg/dL (calc) (ref <130)
Total CHOL/HDL Ratio: 2.3 (calc) (ref <5.0)
Triglycerides: 74 mg/dL (ref <150)

## 2020-02-09 DIAGNOSIS — L814 Other melanin hyperpigmentation: Secondary | ICD-10-CM | POA: Diagnosis not present

## 2020-02-09 DIAGNOSIS — D692 Other nonthrombocytopenic purpura: Secondary | ICD-10-CM | POA: Diagnosis not present

## 2020-02-09 DIAGNOSIS — D239 Other benign neoplasm of skin, unspecified: Secondary | ICD-10-CM | POA: Diagnosis not present

## 2020-02-09 DIAGNOSIS — X32XXXS Exposure to sunlight, sequela: Secondary | ICD-10-CM | POA: Diagnosis not present

## 2020-02-09 DIAGNOSIS — L2089 Other atopic dermatitis: Secondary | ICD-10-CM | POA: Diagnosis not present

## 2020-02-16 ENCOUNTER — Other Ambulatory Visit: Payer: Self-pay | Admitting: Family Medicine

## 2020-02-29 ENCOUNTER — Other Ambulatory Visit: Payer: Self-pay | Admitting: Family Medicine

## 2020-03-11 NOTE — Progress Notes (Deleted)
{Choose 1 Note Type (Telehealth Visit or Telephone Visit):[phone]} Virtual platform was offered given ongoing worsening Covid-19 pandemic.  Date:  03/12/2020   ID:  Kristina, Lee 07-06-1945, MRN [id number]  Patient Location: Home Provider Location: [facility]  PCP:  Bradd Canary, MD  Cardiologist:  Nanetta Batty, MD  Electrophysiologist:  None   Evaluation Performed:  Follow-Up Visit  Chief Complaint: follow-up of CAD  History of Present Illness:    Kristina Lee is a 74 y.o. female with CAD with NSTEMI in 08/2017 while in [place], [place] s/p stenting to proximal LAD, hypertension, hyperlipidemia, and pre-diabetes who is followed by Dr. Allyson Sabal and presents today for routine follow-up.  Patient was referred to [facility] in 10/2017 to get established with a Cardiology after recent NSTEMI in [place], [place], which was treated with stent to proximal LAD. Cath at that time showed no other significant CAD. EF was 45-50%. Repeat Echo was ordered at this visit to reassess LV function. Echo in 10/2017 showed LVEF of 55-60% with normal wall motion and grade 1 diastolic dysfunction. She has done well since her MI and was last seen by Dr. Allyson Sabal in 02/2019 at which time [name] was discontinued and she was started on Plavix instead in addition to low dose Aspirin which she was already taking.  Patient presents today for routine follow-up. ***  CAD - History [place]p stenting to proximal LAD. - No angina.  *** - Continue DAPT with Aspirin and Plavix. Continue high-sensitivity statin.  Hypertension - *** - Continue Lisinopril 5mg  daily. ***  Hyperlipidemia - Recent lipid panel in 01/2020: Total Cholesterol 104, Triglycerides 74, HDL 46, LDL 43. - At LDL goal of <70 given CAD. - Continue Lipitor 80mg  daily. - Labs followed by PCP.  Pre-Diabetes - Hemoglobin A1c 6.0% in 01/2020 consistent with prediabetes. - Recommend lifestyle  modifications.   Past Medical History:  Diagnosis Date  . Diabetes mellitus without complication ([facility])   . Heart attack ([facility]) 09/07/2017  . Urticaria    Past Surgical History:  Procedure Laterality Date  . ABDOMINAL HYSTERECTOMY  2006   b/l SPO and TAH, uterine cancer  . EYE SURGERY Right 1960  . TUBAL LIGATION       No outpatient medications have been marked as taking for the 03/15/20 encounter (Appointment) with 2007, PA-C.     Allergies:   Amoxicillin, Hydroxyzine, and Sulfa antibiotics   Social History   Tobacco Use  . Smoking status: Former Smoker    Packs/day: 0.50    Years: 24.00    Pack years: 12.00    Types: Cigarettes    Quit date: 04/17/1987    Years since quitting: 32.9  . Smokeless tobacco: Never Used  Vaping Use  . Vaping Use: Never used  Substance Use Topics  . Alcohol use: Yes    Comment: occ  . Drug use: Not Currently     Family Hx: The patient's family history includes Alcohol abuse in her father and mother; Cancer in her brother and mother; Depression in her maternal grandmother and sister; Diabetes in her maternal grandmother; Healthy in her son; Hearing loss in her maternal grandmother; Heart disease in her father; Tremor in her sister. There is no history of Allergic rhinitis, Angioedema, Asthma, Eczema, Immunodeficiency, or Urticaria.  ROS:   Please see the history of present illness.    All other systems reviewed and are negative.   Prior CV studies:  The following studies were reviewed today:   Echocardiogram 11/05/2017: Study Conclusions: - Left ventricle: The cavity size was normal. There was mild  concentric hypertrophy. Systolic function was normal. The  estimated ejection fraction was in the range of 55% to 60%. Wall  motion was normal; there were no regional wall motion  abnormalities. Doppler parameters are consistent with abnormal  left ventricular relaxation (grade 1 diastolic dysfunction).  - Aortic  valve: Mildly calcified annulus. Trileaflet; mildly  thickened, mildly calcified leaflets. There was trivial  regurgitation.  - Mitral valve: There was trivial regurgitation.  - Tricuspid valve: There was trivial regurgitation.  - Pulmonic valve: There was trivial regurgitation.   Impressions:  - Normal LV function without wall motion abnormalities. Grade 1  diastolic dysfunction. Trivial valvular disease.    Labs/Other Tests and Data Reviewed:    EKG: Most recent EKG from 03/10/2019 personally reviewed and demonstrates: Normal sinus rhythm with sinus arrhythmia, rate 61 bpm, with no acute ST/T changes.  Recent Labs: 01/22/2020: ALT 13; BUN 16; Creat 0.91; Hemoglobin 14.7; Platelets 195; Potassium 4.3; Sodium 142; TSH 1.12   Recent Lipid Panel Lab Results  Component Value Date/Time   CHOL 104 01/22/2020 11:02 AM   TRIG 74 01/22/2020 11:02 AM   HDL 46 (L) 01/22/2020 11:02 AM   CHOLHDL 2.3 01/22/2020 11:02 AM   LDLCALC 43 01/22/2020 11:02 AM    Wt Readings from Last 3 Encounters:  10/01/19 140 lb (63.5 kg)  09/23/19 131 lb (59.4 kg)  08/20/19 131 lb (59.4 kg)     Objective:    Vital Signs:  LMP  (LMP Unknown)    VS Reviewed. General: No acute distress. Pulm: No labored breathing. No coughing during visit. No audible wheezing. Speaking in full sentences. Neuro: Alert and oriented. No slurred speech. Answers questions appropriately. Psych: Pleasant affect.  ASSESSMENT & PLAN:    1. ***  COVID-19 Education: The signs and symptoms of COVID-19 were discussed with the patient and how to seek care for testing (follow up with PCP or arrange E-visit).  The importance of social distancing was discussed today.  Time:   Today, I have spent *** minutes with the patient with telehealth technology discussing the above problems.     Medication Adjustments/Labs and Tests Ordered: Current medicines are reviewed at length with the patient today.  Concerns regarding medicines  are outlined above.   Follow Up:  {F/U Format:[phone]} {follow up:15908}  Signed, Smitty Knudsen  03/12/2020 2:38 PM    [facility]

## 2020-03-14 NOTE — Progress Notes (Signed)
Virtual Visit via Telephone Note   This visit type was conducted due to national recommendations for restrictions regarding the COVID-19 Pandemic (e.g. social distancing) in an effort to limit this patient's exposure and mitigate transmission in our community.  Due to her co-morbid illnesses, this patient is at least at moderate risk for complications without adequate follow up.  This format is felt to be most appropriate for this patient at this time.  The patient did not have access to video technology/had technical difficulties with video requiring transitioning to audio format only (telephone).  All issues noted in this document were discussed and addressed.  No physical exam could be performed with this format.  Please refer to the patient's chart for her  consent to telehealth for [facility].  Evaluation Performed:  Follow-up visit  This visit type was conducted due to national recommendations for restrictions regarding the COVID-19 Pandemic (e.g. social distancing).  This format is felt to be most appropriate for this patient at this time.  All issues noted in this document were discussed and addressed.  No physical exam was performed (except for noted visual exam findings with Video Visits).  Please refer to the patient's chart (MyChart message for video visits and phone note for telephone visits) for the patient's consent to telehealth for [facility]  Date:  03/15/2020   ID:  Kristina Lee, DOB 1946/03/17, MRN [id number]  Patient Location:  [address] [address]   Provider location:     [place] [address] Office [phone] Fax [phone]   PCP:  Mosie Lukes, MD  Cardiologist:  Quay Burow, MD  Electrophysiologist:  None   Chief Complaint:   Follow-up for coronary artery disease  History of Present Illness:    Kristina Lee is a 74 y.o. female who presents via  audio/video conferencing for a telehealth visit today.  Patient verified DOB and address.  She has a PMH of coronary artery disease, remote tobacco abuse, essential hypertension, hyperlipidemia, and diabetes.  She was noted to have atypical symptoms that led up to NSTEMI 5/19.  She was in [place] and had stenting of her proximal LAD.  A right radial approach was used.  She did not have any other significant CAD.  Her EF at that time was 45-50%.  Echocardiogram 11/05/2017 showed normal LV function and no other abnormalities.  She was seen by Dr. Gwenlyn Found on 03/10/2019 and was doing well at that time.  Her son had moved back into their house with 4 of her children which was adding to her stress at home.  She also indicated that 2 of her friends from middle school were both dying.  She denied chest pain and shortness of breath.  She was continued on aspirin and Brilinta.  She is seen virtually today in follow-up and states she feels well.  She did have a fall in the spring that resulted in a significant right arm injury.  She fell while getting up from the floor and landed on her right arm.  The arm remained painful for around 5 months while she was doing physical therapy and is now back to normal.  She also indicates that she has constant cold type symptoms and was referred to an ENT who did not provide much relief.  She states that she continues to have her son and 4 grandchildren staying at her house.  She does not indicate that  this continues to add a great deal of stress.  I will give her the salty 6 diet sheet, have her increase her physical activity as tolerated, and plan follow-up for 6 months.  Today she denies chest pain, shortness of breath, lower extremity edema, fatigue, palpitations, melena, hematuria, hemoptysis, diaphoresis, weakness, presyncope, syncope, orthopnea, and PND.   The patient does not symptoms concerning for COVID-19 infection (fever, chills, cough, or new SHORTNESS OF BREATH).     Prior CV studies:   The following studies were reviewed today:  EKG 03/11/2019 Normal sinus rhythm with sinus arrhythmia 61 bpm  Echocardiogram 11/05/2017 Study Conclusions   - Left ventricle: The cavity size was normal. There was mild  concentric hypertrophy. Systolic function was normal. The  estimated ejection fraction was in the range of 55% to 60%. Wall  motion was normal; there were no regional wall motion  abnormalities. Doppler parameters are consistent with abnormal  left ventricular relaxation (grade 1 diastolic dysfunction).  - Aortic valve: Mildly calcified annulus. Trileaflet; mildly  thickened, mildly calcified leaflets. There was trivial  regurgitation.  - Mitral valve: There was trivial regurgitation.  - Tricuspid valve: There was trivial regurgitation.  - Pulmonic valve: There was trivial regurgitation.   Impressions:   - Normal LV function without wall motion abnormalities. Grade 1  diastolic dysfunction. Trivial valvular disease.   Past Medical History:  Diagnosis Date   Diabetes mellitus without complication ([place])    Heart attack ([place]) 09/07/2017   Urticaria    Past Surgical History:  Procedure Laterality Date   ABDOMINAL HYSTERECTOMY  2006   b/l SPO and TAH, uterine cancer   EYE SURGERY Right 1960   TUBAL LIGATION       Current Meds  Medication Sig   Accu-Chek Softclix Lancets lancets Use as instructed   aspirin EC 81 MG tablet Take 81 mg by mouth daily.   atorvastatin (LIPITOR) 80 MG tablet TAKE 1 TABLET BY MOUTH EVERY DAY   blood glucose meter kit and supplies KIT Dispense based on patient and insurance preference. Check blood sugars tid prn Dx; E11.9   Calcium Carbonate-Vit D-Min (CALCIUM 600+D PLUS MINERALS) 600-400 MG-UNIT TABS Take 2 tablets by mouth daily.    clopidogrel (PLAVIX) 75 MG tablet Take 1 tablet (75 mg total) by mouth daily.   diclofenac sodium (VOLTAREN) 1 % GEL APPLY 4 G TOPICALLY 4 (FOUR)  TIMES DAILY AS NEEDED FOR PAIN   glucose blood (COOL BLOOD GLUCOSE TEST STRIPS) test strip Use as instructedDispense based on patient and insurance preference. Check blood sugars tid prn Dx; E11.9   Lancets MISC Dispense based on patient and insurance preference. Check blood sugars tid prn Dx; E11.9   lisinopril (ZESTRIL) 5 MG tablet Take 1 tablet (5 mg total) by mouth 2 (two) times daily.   OVER THE COUNTER MEDICATION as needed.   polyethylene glycol (MIRALAX / GLYCOLAX) 17 g packet Take 17 g by mouth as needed.     Allergies:   Amoxicillin, Hydroxyzine, and Sulfa antibiotics   Social History   Tobacco Use   Smoking status: Former Smoker    Packs/day: 0.50    Years: 24.00    Pack years: 12.00    Types: Cigarettes    Quit date: 04/17/1987    Years since quitting: 32.9   Smokeless tobacco: Never Used  Vaping Use   Vaping Use: Never used  Substance Use Topics   Alcohol use: Yes    Comment: occ   Drug use: Not Currently  Family Hx: The patient's family history includes Alcohol abuse in her father and mother; Cancer in her brother and mother; Depression in her maternal grandmother and sister; Diabetes in her maternal grandmother; Healthy in her son; Hearing loss in her maternal grandmother; Heart disease in her father; Tremor in her sister. There is no history of Allergic rhinitis, Angioedema, Asthma, Eczema, Immunodeficiency, or Urticaria.  ROS:   Please see the history of present illness.     All other systems reviewed and are negative.   Labs/Other Tests and Data Reviewed:    Recent Labs: 01/22/2020: ALT 13; BUN 16; Creat 0.91; Hemoglobin 14.7; Platelets 195; Potassium 4.3; Sodium 142; TSH 1.12   Recent Lipid Panel Lab Results  Component Value Date/Time   CHOL 104 01/22/2020 11:02 AM   TRIG 74 01/22/2020 11:02 AM   HDL 46 (L) 01/22/2020 11:02 AM   CHOLHDL 2.3 01/22/2020 11:02 AM   LDLCALC 43 01/22/2020 11:02 AM    Wt Readings from Last 3 Encounters:   03/15/20 143 lb (64.9 kg)  10/01/19 140 lb (63.5 kg)  09/23/19 131 lb (59.4 kg)     Exam:    Vital Signs:  Ht 5' 2"  (1.575 m)    Wt 143 lb (64.9 kg)    LMP  (LMP Unknown)    BMI 26.16 kg/m    Well nourished, well developed female in no  acute distress.   ASSESSMENT & PLAN:    1.  Essential hypertension-BP today unable to obtain.  Denies neurological changes, headaches, and vision changes.  Continue lisinopril Heart healthy low-sodium diet-salty 6 given Increase physical activity as tolerated Avoid secondary causes of hypertension (stimulants, caffeine, chocolate, EtOH, diet medications, sodium, St. John's wort, licorice etc.)  [id number]-7/78/2423: VLDL 16.8 01/22/2020: Cholesterol 104; HDL 46; LDL Cholesterol (Calc) 43; Triglycerides 74 Continue atorvastatin Heart healthy low-sodium high-fiber diet Increase physical activity as tolerated  Coronary artery disease-no chest pain today.  Underwent cardiac catheterization with stenting of the proximal LAD 5/19.  Cath showed no other significant CAD.  EF improved to normal 7/19.  Denies episodes of exertional chest pain or arm/neck discomfort. Continue aspirin, Plavix, atorvastatin Heart healthy low-sodium diet-salty 6 given Increase physical activity as tolerated   Disposition: Follow-up with Dr. Gwenlyn Found or me in 12 months.  COVID-19 Education: The signs and symptoms of COVID-19 were discussed with the patient and how to seek care for testing (follow up with PCP or arrange E-visit).  The importance of social distancing was discussed today.  Patient Risk:   After full review of this patients clinical status, I feel that they are at least moderate risk at this time.  Time:   Today, I have spent 8:20 minutes with the patient with telehealth technology discussing diet, exercise, medication.  I spent greater than 20 minutes prior to the visit reviewing patient's past notes, past cardiac tests, and cardiac  medications.   Medication Adjustments/Labs and Tests Ordered: Current medicines are reviewed at length with the patient today.  Concerns regarding medicines are outlined above.   Tests Ordered: No orders of the defined types were placed in this encounter.  Medication Changes: No orders of the defined types were placed in this encounter.   Disposition:  in 1 year(s)  Signed, Jossie Ng. Artin Mceuen NP-C    11/18/2018 11:58 AM    [address] Office [phone] Fax [phone]

## 2020-03-15 ENCOUNTER — Encounter: Payer: Self-pay | Admitting: General Practice

## 2020-03-15 ENCOUNTER — Telehealth: Payer: Medicare PPO | Admitting: Student

## 2020-03-15 ENCOUNTER — Telehealth (INDEPENDENT_AMBULATORY_CARE_PROVIDER_SITE_OTHER): Payer: Medicare PPO | Admitting: General Practice

## 2020-03-15 VITALS — Ht 62.0 in | Wt 143.0 lb

## 2020-03-15 DIAGNOSIS — E1169 Type 2 diabetes mellitus with other specified complication: Secondary | ICD-10-CM | POA: Diagnosis not present

## 2020-03-15 DIAGNOSIS — E785 Hyperlipidemia, unspecified: Secondary | ICD-10-CM

## 2020-03-15 DIAGNOSIS — I10 Essential (primary) hypertension: Secondary | ICD-10-CM | POA: Diagnosis not present

## 2020-03-15 DIAGNOSIS — I2510 Atherosclerotic heart disease of native coronary artery without angina pectoris: Secondary | ICD-10-CM | POA: Diagnosis not present

## 2020-03-15 NOTE — Patient Instructions (Signed)
Medication Instructions:  The current medical regimen is effective;  continue present plan and medications as directed. Please refer to the Current Medication list given to you today.  *If you need a refill on your cardiac medications before your next appointment, please call your pharmacy*  Lab Work:   Testing/Procedures:  NONE    NONE  Special Instructions PLEASE READ AND FOLLOW SALTY 6-ATTACHED-1,800mg  daily  PLEASE INCREASE PHYSICAL ACTIVITY AS TOLERATED  Follow-Up: Your next appointment:  12 month(s) In Person with You may see Nanetta Batty, MD IF UNAVAILABLE JESSE CLEAVER, FNP-C or one of the following Advanced Practice Providers on your designated Care Team:  Corine Shelter, PA-C  Marjie Skiff, [place]  Please call our office 2 months in advance(SEPTEMBER 2022) to schedule this appointment   [facility], you and your health needs are our priority.  As part of our continuing mission to provide you with exceptional heart care, we have created designated Provider Care Teams.  These Care Teams include your primary Cardiologist (physician) and Advanced Practice Providers (APPs -  Physician Assistants and Nurse Practitioners) who all work together to provide you with the care you need, when you need it.  We recommend signing up for the patient portal called "MyChart".  Sign up information is provided on this After Visit Summary.  MyChart is used to connect with patients for Virtual Visits (Telemedicine).  Patients are able to view lab/test results, encounter notes, upcoming appointments, etc.  Non-urgent messages can be sent to your provider as well.   To learn more about what you can do with MyChart, go to [email or web address].              6 SALTY THINGS TO AVOID     1,800MG  DAILY

## 2020-03-24 ENCOUNTER — Ambulatory Visit: Payer: Medicare PPO | Admitting: Specialist

## 2020-06-24 ENCOUNTER — Other Ambulatory Visit: Payer: Self-pay | Admitting: Family Medicine

## 2020-07-11 ENCOUNTER — Telehealth (INDEPENDENT_AMBULATORY_CARE_PROVIDER_SITE_OTHER): Payer: Medicare PPO | Admitting: Family

## 2020-07-11 ENCOUNTER — Other Ambulatory Visit: Payer: Self-pay

## 2020-07-11 DIAGNOSIS — R059 Cough, unspecified: Secondary | ICD-10-CM | POA: Diagnosis not present

## 2020-07-11 DIAGNOSIS — U071 COVID-19: Secondary | ICD-10-CM | POA: Diagnosis not present

## 2020-07-11 DIAGNOSIS — Z20822 Contact with and (suspected) exposure to covid-19: Secondary | ICD-10-CM | POA: Diagnosis not present

## 2020-07-11 MED ORDER — AZITHROMYCIN 250 MG PO TABS: ORAL_TABLET | ORAL | 0 refills | Status: DC

## 2020-07-11 MED ORDER — BENZONATATE 100 MG PO CAPS: 100.0000 mg | ORAL_CAPSULE | Freq: Three times a day (TID) | ORAL | 0 refills | Status: DC | PRN

## 2020-07-11 NOTE — Patient Instructions (Signed)
Start azithromycin (antibiotic). You may use tessalon as needed for cough. Call if symptoms worsen or if not improved in 3 days. Go to the ER if you develop worsening weakness or shortness of breath.

## 2020-07-11 NOTE — Progress Notes (Signed)
Virtual Visit via Telephone Note  I connected with Kellsie Grindle on 07/11/20 at 10:40 AM EDT by telephone and verified that I am speaking with the correct person using two identifiers.  Location: Patient: home Provider: work   I discussed the limitations, risks, security and privacy concerns of performing an evaluation and management service by telephone and the availability of in person appointments. I also discussed with the patient that there may be a patient responsible charge related to this service. The patient expressed understanding and agreed to proceed.   History of Present Illness:  Patient is a 75 yr old female who presents today with chief complaint of cough. States she woke up on saturday 3/26 with a pretty bad HA. Currently she only has a mild headache, but has developed a cough. Stat  + cough "all the time day and night."  Feels lethargic.  Mild phlegm.  She reports mild wheezing yesterday but none today (she had one episode of fecal incontinence yesterday with a forceful sneeze). She reports mild runny nose. She reports temp 100 last night.  She has not checked today.  Denies dysuria/frequency.     Observations/Objective:   Gen: Awake, alert, no acute distress Resp: Breathing sounds even and non-labored Psych: calm/pleasant demeanor Neuro: Alert and Oriented x 3, speech sounds clear.  Assessment and Plan:  Cough- new. Pt is fully vaccinated and boosted against COVID-19. I did suggest that she complete a home covid test and to notify us if it comes back positive.  In the meantime advised pt on plan as follows:  Start azithromycin (antibiotic). You may use tessalon as needed for cough. Call if symptoms worsen or if not improved in 3 days. Go to the ER if you develop worsening weakness or shortness of breath.   Follow Up Instructions:    I discussed the assessment and treatment plan with the patient. The patient was provided an opportunity to ask questions and  all were answered. The patient agreed with the plan and demonstrated an understanding of the instructions.   The patient was advised to call back or seek an in-person evaluation if the symptoms worsen or if the condition fails to improve as anticipated.  I provided  12 minutes of non-face-to-face time during this encounter.   Lemont Fillers, NP

## 2020-07-12 ENCOUNTER — Telehealth: Payer: Self-pay | Admitting: Family Medicine

## 2020-07-12 ENCOUNTER — Other Ambulatory Visit: Payer: Self-pay | Admitting: Family Medicine

## 2020-07-12 DIAGNOSIS — E1169 Type 2 diabetes mellitus with other specified complication: Secondary | ICD-10-CM

## 2020-07-12 DIAGNOSIS — I2510 Atherosclerotic heart disease of native coronary artery without angina pectoris: Secondary | ICD-10-CM

## 2020-07-12 DIAGNOSIS — N183 Chronic kidney disease, stage 3 unspecified: Secondary | ICD-10-CM

## 2020-07-12 DIAGNOSIS — I10 Essential (primary) hypertension: Secondary | ICD-10-CM

## 2020-07-12 DIAGNOSIS — E785 Hyperlipidemia, unspecified: Secondary | ICD-10-CM

## 2020-07-12 DIAGNOSIS — U071 COVID-19: Secondary | ICD-10-CM

## 2020-07-12 NOTE — Telephone Encounter (Signed)
Patient has tested positive for COVID 19+, Please advise

## 2020-07-12 NOTE — Telephone Encounter (Signed)
Pt has tested positive for covid would like advise

## 2020-07-12 NOTE — Telephone Encounter (Signed)
I put in a Covid treatment referral so she should hear from them first thing tomorrow. She should get a pulse oximeter and monitor her oxygen, if her oxygen drops to the 80s and does not recover with deep breathing she should get looked at. Omron Blood Pressure cuff, upper arm, want BP 100-140/60-90 If not already taking Take Multivitamin with minerals, selenium Vitamin D 1000-2000 IU daily Probiotic with lactobacillus and bifidophilus Asprin EC 81 mg daily Fish or krill or flaxseed oil caps daily Melatonin 2-5 mg at bedtime  She should let us know what her symptoms are currently and if they change

## 2020-07-13 ENCOUNTER — Telehealth: Payer: Self-pay

## 2020-07-13 NOTE — Telephone Encounter (Signed)
Lvm to call back

## 2020-07-13 NOTE — Telephone Encounter (Signed)
Sent message on my chart

## 2020-07-13 NOTE — Telephone Encounter (Signed)
Called to discuss with patient about COVID-19 symptoms and the use of one of the available treatments for those with mild to moderate Covid symptoms and at a high risk of hospitalization.  Pt appears to qualify for outpatient treatment due to co-morbid conditions and/or a member of an at-risk group in accordance with the FDA Emergency Use Authorization.    Symptom onset: Unknown Vaccinated: Yes Booster? Yes Immunocompromised? No Qualifiers: CAD,HTN,DM  Unable to reach pt - Left message and call back number [phone].  Esther Hardy

## 2020-07-25 ENCOUNTER — Encounter: Payer: Medicare PPO | Admitting: Family Medicine

## 2020-07-27 NOTE — Telephone Encounter (Signed)
SENT IN MYCHART 

## 2020-09-01 DIAGNOSIS — H524 Presbyopia: Secondary | ICD-10-CM | POA: Diagnosis not present

## 2020-09-01 DIAGNOSIS — H5203 Hypermetropia, bilateral: Secondary | ICD-10-CM | POA: Diagnosis not present

## 2020-09-01 DIAGNOSIS — H25013 Cortical age-related cataract, bilateral: Secondary | ICD-10-CM | POA: Diagnosis not present

## 2020-09-01 DIAGNOSIS — H2513 Age-related nuclear cataract, bilateral: Secondary | ICD-10-CM | POA: Diagnosis not present

## 2020-09-01 DIAGNOSIS — H11151 Pinguecula, right eye: Secondary | ICD-10-CM | POA: Diagnosis not present

## 2020-09-01 DIAGNOSIS — E119 Type 2 diabetes mellitus without complications: Secondary | ICD-10-CM | POA: Diagnosis not present

## 2020-09-01 DIAGNOSIS — H43813 Vitreous degeneration, bilateral: Secondary | ICD-10-CM | POA: Diagnosis not present

## 2020-09-01 DIAGNOSIS — H53031 Strabismic amblyopia, right eye: Secondary | ICD-10-CM | POA: Diagnosis not present

## 2020-09-01 DIAGNOSIS — H52203 Unspecified astigmatism, bilateral: Secondary | ICD-10-CM | POA: Diagnosis not present

## 2020-09-07 ENCOUNTER — Other Ambulatory Visit: Payer: Self-pay | Admitting: Family Medicine

## 2020-09-08 DIAGNOSIS — L2089 Other atopic dermatitis: Secondary | ICD-10-CM | POA: Diagnosis not present

## 2020-09-08 DIAGNOSIS — Z85828 Personal history of other malignant neoplasm of skin: Secondary | ICD-10-CM | POA: Diagnosis not present

## 2020-09-08 DIAGNOSIS — I8391 Asymptomatic varicose veins of right lower extremity: Secondary | ICD-10-CM | POA: Diagnosis not present

## 2020-09-08 DIAGNOSIS — I8392 Asymptomatic varicose veins of left lower extremity: Secondary | ICD-10-CM | POA: Diagnosis not present

## 2020-09-21 ENCOUNTER — Other Ambulatory Visit: Payer: Self-pay | Admitting: Family Medicine

## 2020-09-29 ENCOUNTER — Other Ambulatory Visit: Payer: Self-pay

## 2020-09-29 ENCOUNTER — Ambulatory Visit (INDEPENDENT_AMBULATORY_CARE_PROVIDER_SITE_OTHER): Payer: Medicare PPO

## 2020-09-29 ENCOUNTER — Ambulatory Visit (INDEPENDENT_AMBULATORY_CARE_PROVIDER_SITE_OTHER): Payer: Medicare PPO | Admitting: Family Medicine

## 2020-09-29 ENCOUNTER — Encounter: Payer: Self-pay | Admitting: Family Medicine

## 2020-09-29 VITALS — BP 148/90 | HR 61 | Temp 97.9°F | Resp 16 | Ht 62.0 in | Wt 140.8 lb

## 2020-09-29 VITALS — BP 128/72 | HR 61 | Temp 97.9°F | Resp 16 | Ht 62.0 in | Wt 140.9 lb

## 2020-09-29 DIAGNOSIS — R739 Hyperglycemia, unspecified: Secondary | ICD-10-CM | POA: Insufficient documentation

## 2020-09-29 DIAGNOSIS — N183 Chronic kidney disease, stage 3 unspecified: Secondary | ICD-10-CM

## 2020-09-29 DIAGNOSIS — R059 Cough, unspecified: Secondary | ICD-10-CM | POA: Diagnosis not present

## 2020-09-29 DIAGNOSIS — J301 Allergic rhinitis due to pollen: Secondary | ICD-10-CM | POA: Diagnosis not present

## 2020-09-29 DIAGNOSIS — E785 Hyperlipidemia, unspecified: Secondary | ICD-10-CM

## 2020-09-29 DIAGNOSIS — H9312 Tinnitus, left ear: Secondary | ICD-10-CM | POA: Insufficient documentation

## 2020-09-29 DIAGNOSIS — B3789 Other sites of candidiasis: Secondary | ICD-10-CM

## 2020-09-29 DIAGNOSIS — Z0000 Encounter for general adult medical examination without abnormal findings: Secondary | ICD-10-CM | POA: Insufficient documentation

## 2020-09-29 DIAGNOSIS — Z780 Asymptomatic menopausal state: Secondary | ICD-10-CM

## 2020-09-29 DIAGNOSIS — I10 Essential (primary) hypertension: Secondary | ICD-10-CM | POA: Diagnosis not present

## 2020-09-29 DIAGNOSIS — K2100 Gastro-esophageal reflux disease with esophagitis, without bleeding: Secondary | ICD-10-CM

## 2020-09-29 DIAGNOSIS — E1169 Type 2 diabetes mellitus with other specified complication: Secondary | ICD-10-CM | POA: Diagnosis not present

## 2020-09-29 DIAGNOSIS — Z1231 Encounter for screening mammogram for malignant neoplasm of breast: Secondary | ICD-10-CM | POA: Diagnosis not present

## 2020-09-29 DIAGNOSIS — E2839 Other primary ovarian failure: Secondary | ICD-10-CM | POA: Diagnosis not present

## 2020-09-29 DIAGNOSIS — K5900 Constipation, unspecified: Secondary | ICD-10-CM | POA: Insufficient documentation

## 2020-09-29 DIAGNOSIS — Z1159 Encounter for screening for other viral diseases: Secondary | ICD-10-CM | POA: Diagnosis not present

## 2020-09-29 HISTORY — DX: Hyperglycemia, unspecified: R73.9

## 2020-09-29 HISTORY — DX: Tinnitus, left ear: H93.12

## 2020-09-29 MED ORDER — NYSTATIN 100000 UNIT/GM EX OINT: 1.0000 "application " | TOPICAL_OINTMENT | Freq: Two times a day (BID) | CUTANEOUS | 0 refills | Status: DC

## 2020-09-29 MED ORDER — SUCRALFATE 1 GM/10ML PO SUSP: 1.0000 g | Freq: Three times a day (TID) | ORAL | 0 refills | Status: DC

## 2020-09-29 MED ORDER — NYSTATIN-TRIAMCINOLONE 100000-0.1 UNIT/GM-% EX OINT: 1.0000 "application " | TOPICAL_OINTMENT | Freq: Two times a day (BID) | CUTANEOUS | 1 refills | Status: DC

## 2020-09-29 MED ORDER — NYSTATIN 100000 UNIT/ML MT SUSP: 5.0000 mL | Freq: Four times a day (QID) | OROMUCOSAL | 0 refills | Status: DC

## 2020-09-29 NOTE — Assessment & Plan Note (Signed)
Well controlled, no changes to meds. Encouraged heart healthy diet such as the DASH diet and exercise as tolerated.  °

## 2020-09-29 NOTE — Assessment & Plan Note (Signed)
Hydrate and monitor 

## 2020-09-29 NOTE — Patient Instructions (Addendum)
Encouraged increased hydration and fiber in diet. Daily probiotics. If bowels not moving can use MOM 2 tbls po in 4 oz of warm prune juice by mouth every 2-3 days. If no results then repeat in 4 hours with  Dulcolax suppository pr, may repeat again in 4 more hours as needed. Seek care if symptoms worsen. Consider daily Miralax and/or Dulcolax if symptoms persist.    Add Benefiber twice a day and probiotic of your choice  Next step mix Miralax and Benefiber together once to twice daily   Preventive Care 65 Years and Older, Female Preventive care refers to lifestyle choices and visits with your health care provider that can promote health and wellness. This includes: A yearly physical exam. This is also called an annual wellness visit. Regular dental and eye exams. Immunizations. Screening for certain conditions. Healthy lifestyle choices, such as: Eating a healthy diet. Getting regular exercise. Not using drugs or products that contain nicotine and tobacco. Limiting alcohol use. What can I expect for my preventive care visit? Physical exam Your health care provider will check your: Height and weight. These may be used to calculate your BMI (body mass index). BMI is a measurement that tells if you are at a healthy weight. Heart rate and blood pressure. Body temperature. Skin for abnormal spots. Counseling Your health care provider may ask you questions about your: Past medical problems. Family's medical history. Alcohol, tobacco, and drug use. Emotional well-being. Home life and relationship well-being. Sexual activity. Diet, exercise, and sleep habits. History of falls. Memory and ability to understand (cognition). Work and work Statistician. Pregnancy and menstrual history. Access to firearms. What immunizations do I need?  Vaccines are usually given at various ages, according to a schedule. Your health care provider will recommend vaccines for you based on your age,  medicalhistory, and lifestyle or other factors, such as travel or where you work. What tests do I need? Blood tests Lipid and cholesterol levels. These may be checked every 5 years, or more often depending on your overall health. Hepatitis C test. Hepatitis B test. Screening Lung cancer screening. You may have this screening every year starting at age 22 if you have a 30-pack-year history of smoking and currently smoke or have quit within the past 15 years. Colorectal cancer screening. All adults should have this screening starting at age 69 and continuing until age 21. Your health care provider may recommend screening at age 61 if you are at increased risk. You will have tests every 1-10 years, depending on your results and the type of screening test. Diabetes screening. This is done by checking your blood sugar (glucose) after you have not eaten for a while (fasting). You may have this done every 1-3 years. Mammogram. This may be done every 1-2 years. Talk with your health care provider about how often you should have regular mammograms. Abdominal aortic aneurysm (AAA) screening. You may need this if you are a current or former smoker. BRCA-related cancer screening. This may be done if you have a family history of breast, ovarian, tubal, or peritoneal cancers. Other tests STD (sexually transmitted disease) testing, if you are at risk. Bone density scan. This is done to screen for osteoporosis. You may have this done starting at age 39. Talk with your health care provider about your test results, treatment options,and if necessary, the need for more tests. Follow these instructions at home: Eating and drinking  Eat a diet that includes fresh fruits and vegetables, whole grains, lean protein,  and low-fat dairy products. Limit your intake of foods with high amounts of sugar, saturated fats, and salt. Take vitamin and mineral supplements as recommended by your health care provider. Do not  drink alcohol if your health care provider tells you not to drink. If you drink alcohol: Limit how much you have to 0-1 drink a day. Be aware of how much alcohol is in your drink. In the [place] one drink equals one 12 oz bottle of beer (355 mL), one 5 oz glass of wine (148 mL), or one 1 oz glass of hard liquor (44 mL).  Lifestyle Take daily care of your teeth and gums. Brush your teeth every morning and night with fluoride toothpaste. Floss one time each day. Stay active. Exercise for at least 30 minutes 5 or more days each week. Do not use any products that contain nicotine or tobacco, such as cigarettes, e-cigarettes, and chewing tobacco. If you need help quitting, ask your health care provider. Do not use drugs. If you are sexually active, practice safe sex. Use a condom or other form of protection in order to prevent STIs (sexually transmitted infections). Talk with your health care provider about taking a low-dose aspirin or statin. Find healthy ways to cope with stress, such as: Meditation, yoga, or listening to music. Journaling. Talking to a trusted person. Spending time with friends and family. Safety Always wear your seat belt while driving or riding in a vehicle. Do not drive: If you have been drinking alcohol. Do not ride with someone who has been drinking. When you are tired or distracted. While texting. Wear a helmet and other protective equipment during sports activities. If you have firearms in your house, make sure you follow all gun safety procedures. What's next? Visit your health care provider once a year for an annual wellness visit. Ask your health care provider how often you should have your eyes and teeth checked. Stay up to date on all vaccines. This information is not intended to replace advice given to you by your health care provider. Make sure you discuss any questions you have with your healthcare provider. Document Revised: 03/23/2020 Document Reviewed:  03/27/2018 Elsevier Patient Education  2022 [employer].

## 2020-09-29 NOTE — Patient Instructions (Signed)
Kristina Lee , Thank you for taking time to come for your Medicare Wellness Visit. I appreciate your ongoing commitment to your health goals. Please review the following plan we discussed and let me know if I can assist you in the future.   Screening recommendations/referrals: Colonoscopy: No longer required Mammogram: Ordered today. Someone will call you to schedule. Bone Density: Ordered today. Someone will call you to schedule. Recommended yearly ophthalmology/optometry visit for glaucoma screening and checkup Recommended yearly dental visit for hygiene and checkup  Vaccinations: Influenza vaccine: Up to date Pneumococcal vaccine: Up to date Tdap vaccine: Up to date-Due 07/13/2024 Shingles vaccine: Discuss with pharmacy   Covid-19:Up to date  Advanced directives: Please bring a copy for your chart  Conditions/risks identified: See problem list  Next appointment: Follow up in one year for your annual wellness visit    Preventive Care 65 Years and Older, Female Preventive care refers to lifestyle choices and visits with your health care provider that can promote health and wellness. What does preventive care include? A yearly physical exam. This is also called an annual well check. Dental exams once or twice a year. Routine eye exams. Ask your health care provider how often you should have your eyes checked. Personal lifestyle choices, including: Daily care of your teeth and gums. Regular physical activity. Eating a healthy diet. Avoiding tobacco and drug use. Limiting alcohol use. Practicing safe sex. Taking low-dose aspirin every day. Taking vitamin and mineral supplements as recommended by your health care provider. What happens during an annual well check? The services and screenings done by your health care provider during your annual well check will depend on your age, overall health, lifestyle risk factors, and family history of disease. Counseling  Your health care  provider may ask you questions about your: Alcohol use. Tobacco use. Drug use. Emotional well-being. Home and relationship well-being. Sexual activity. Eating habits. History of falls. Memory and ability to understand (cognition). Work and work Astronomer. Reproductive health. Screening  You may have the following tests or measurements: Height, weight, and BMI. Blood pressure. Lipid and cholesterol levels. These may be checked every 5 years, or more frequently if you are over 55 years old. Skin check. Lung cancer screening. You may have this screening every year starting at age 38 if you have a 30-pack-year history of smoking and currently smoke or have quit within the past 15 years. Fecal occult blood test (FOBT) of the stool. You may have this test every year starting at age 27. Flexible sigmoidoscopy or colonoscopy. You may have a sigmoidoscopy every 5 years or a colonoscopy every 10 years starting at age 40. Hepatitis C blood test. Hepatitis B blood test. Sexually transmitted disease (STD) testing. Diabetes screening. This is done by checking your blood sugar (glucose) after you have not eaten for a while (fasting). You may have this done every 1-3 years. Bone density scan. This is done to screen for osteoporosis. You may have this done starting at age 32. Mammogram. This may be done every 1-2 years. Talk to your health care provider about how often you should have regular mammograms. Talk with your health care provider about your test results, treatment options, and if necessary, the need for more tests. Vaccines  Your health care provider may recommend certain vaccines, such as: Influenza vaccine. This is recommended every year. Tetanus, diphtheria, and acellular pertussis (Tdap, Td) vaccine. You may need a Td booster every 10 years. Zoster vaccine. You may need this after age 71.  Pneumococcal 13-valent conjugate (PCV13) vaccine. One dose is recommended after age  56. Pneumococcal polysaccharide (PPSV23) vaccine. One dose is recommended after age 51. Talk to your health care provider about which screenings and vaccines you need and how often you need them. This information is not intended to replace advice given to you by your health care provider. Make sure you discuss any questions you have with your health care provider. Document Released: 04/29/2015 Document Revised: 12/21/2015 Document Reviewed: 02/01/2015 Elsevier Interactive Patient Education  2017 [place] Prevention in the Home Falls can cause injuries. They can happen to people of all ages. There are many things you can do to make your home safe and to help prevent falls. What can I do on the outside of my home? Regularly fix the edges of walkways and driveways and fix any cracks. Remove anything that might make you trip as you walk through a door, such as a raised step or threshold. Trim any bushes or trees on the path to your home. Use bright outdoor lighting. Clear any walking paths of anything that might make someone trip, such as rocks or tools. Regularly check to see if handrails are loose or broken. Make sure that both sides of any steps have handrails. Any raised decks and porches should have guardrails on the edges. Have any leaves, snow, or ice cleared regularly. Use sand or salt on walking paths during winter. Clean up any spills in your garage right away. This includes oil or grease spills. What can I do in the bathroom? Use night lights. Install grab bars by the toilet and in the tub and shower. Do not use towel bars as grab bars. Use non-skid mats or decals in the tub or shower. If you need to sit down in the shower, use a plastic, non-slip stool. Keep the floor dry. Clean up any water that spills on the floor as soon as it happens. Remove soap buildup in the tub or shower regularly. Attach bath mats securely with double-sided non-slip rug tape. Do not have throw  rugs and other things on the floor that can make you trip. What can I do in the bedroom? Use night lights. Make sure that you have a light by your bed that is easy to reach. Do not use any sheets or blankets that are too big for your bed. They should not hang down onto the floor. Have a firm chair that has side arms. You can use this for support while you get dressed. Do not have throw rugs and other things on the floor that can make you trip. What can I do in the kitchen? Clean up any spills right away. Avoid walking on wet floors. Keep items that you use a lot in easy-to-reach places. If you need to reach something above you, use a strong step stool that has a grab bar. Keep electrical cords out of the way. Do not use floor polish or wax that makes floors slippery. If you must use wax, use non-skid floor wax. Do not have throw rugs and other things on the floor that can make you trip. What can I do with my stairs? Do not leave any items on the stairs. Make sure that there are handrails on both sides of the stairs and use them. Fix handrails that are broken or loose. Make sure that handrails are as long as the stairways. Check any carpeting to make sure that it is firmly attached to the stairs. Fix any  carpet that is loose or worn. Avoid having throw rugs at the top or bottom of the stairs. If you do have throw rugs, attach them to the floor with carpet tape. Make sure that you have a light switch at the top of the stairs and the bottom of the stairs. If you do not have them, ask someone to add them for you. What else can I do to help prevent falls? Wear shoes that: Do not have high heels. Have rubber bottoms. Are comfortable and fit you well. Are closed at the toe. Do not wear sandals. If you use a stepladder: Make sure that it is fully opened. Do not climb a closed stepladder. Make sure that both sides of the stepladder are locked into place. Ask someone to hold it for you, if  possible. Clearly mark and make sure that you can see: Any grab bars or handrails. First and last steps. Where the edge of each step is. Use tools that help you move around (mobility aids) if they are needed. These include: Canes. Walkers. Scooters. Crutches. Turn on the lights when you go into a dark area. Replace any light bulbs as soon as they burn out. Set up your furniture so you have a clear path. Avoid moving your furniture around. If any of your floors are uneven, fix them. If there are any pets around you, be aware of where they are. Review your medicines with your doctor. Some medicines can make you feel dizzy. This can increase your chance of falling. Ask your doctor what other things that you can do to help prevent falls. This information is not intended to replace advice given to you by your health care provider. Make sure you discuss any questions you have with your health care provider. Document Released: 01/27/2009 Document Revised: 09/08/2015 Document Reviewed: 05/07/2014 Elsevier Interactive Patient Education  2017 [employer].

## 2020-09-29 NOTE — Assessment & Plan Note (Signed)
Encouraged increased hydration and fiber in diet. Daily probiotics. If bowels not moving can use MOM 2 tbls po in 4 oz of warm prune juice by mouth every 2-3 days. If no results then repeat in 4 hours with  Dulcolax suppository pr, may repeat again in 4 more hours as needed. Seek care if symptoms worsen. Consider daily Miralax and/or Dulcolax if symptoms persist.    Add Benefiber twice a day and probiotic of your choice  Next step mix Miralax and Benefiber together once to twice daily notify us if does not improve for referral to gastroenterology

## 2020-09-29 NOTE — Assessment & Plan Note (Signed)
Patient encouraged to maintain heart healthy diet, regular exercise, adequate sleep. Consider daily probiotics. Take medications as prescribed. Labs ordered and reviewed. Last colonoscopy in 2018 normal.

## 2020-09-29 NOTE — Assessment & Plan Note (Signed)
hgba1c acceptable, minimize simple carbs. Increase exercise as tolerated.  

## 2020-09-29 NOTE — Progress Notes (Signed)
Subjective:   Kristina Lee is a 75 y.o. female who presents for Medicare Annual (Subsequent) preventive examination.  Review of Systems     Cardiac Risk Factors include: advanced age (>20mn, >>12women);diabetes mellitus;hypertension;dyslipidemia     Objective:    Today's Vitals   09/29/20 1323 09/29/20 1330  BP: (!) 148/90   Pulse: 61   Resp: 16   Temp: 97.9 F (36.6 C)   TempSrc: Temporal   SpO2: 97%   Weight: 140 lb 12.8 oz (63.9 kg)   Height: 5' 2"  (1.575 m)   PainSc:  8    Body mass index is 25.75 kg/m.  Advanced Directives 09/29/2020 09/25/2019 08/03/2019 07/22/2018 04/29/2018 11/18/2017  Does Patient Have a Medical Advance Directive? Yes Yes Yes Yes Yes No  Type of AParamedicof AMiguel BarreraLiving will HArgyleLiving will - HMohn[place]Living will - -  Does patient want to make changes to medical advance directive? - No - Patient declined No - Patient declined No - Guardian declined No - Patient declined -  Copy of HEssexin Chart? No - copy requested No - copy requested - No - copy requested - -  Would patient like information on creating a medical advance directive? - - - - - No - Patient declined    Current Medications (verified) Outpatient Encounter Medications as of 09/29/2020  Medication Sig   Accu-Chek Softclix Lancets lancets Use as instructed   Acetaminophen (TYLENOL PO) Take by mouth as needed.   aspirin EC 81 MG tablet Take 81 mg by mouth daily.   atorvastatin (LIPITOR) 80 MG tablet Take 1 tablet (80 mg total) by mouth daily.   augmented betamethasone dipropionate (DIPROLENE-AF) 0.05 % cream Apply topically daily.   blood glucose meter kit and supplies KIT Dispense based on patient and insurance preference. Check blood sugars tid prn Dx; E11.9   Calcium Carbonate-Vit D-Min (CALCIUM 600+D PLUS MINERALS) 600-400 MG-UNIT TABS Take 2 tablets by mouth daily.    clopidogrel  (PLAVIX) 75 MG tablet Take 1 tablet (75 mg total) by mouth daily.   diclofenac sodium (VOLTAREN) 1 % GEL APPLY 4 G TOPICALLY 4 (FOUR) TIMES DAILY AS NEEDED FOR PAIN   glucose blood (COOL BLOOD GLUCOSE TEST STRIPS) test strip Use as instructedDispense based on patient and insurance preference. Check blood sugars tid prn Dx; E11.9   Lancets MISC Dispense based on patient and insurance preference. Check blood sugars tid prn Dx; E11.9   lisinopril (ZESTRIL) 5 MG tablet Take 1 tablet (5 mg total) by mouth 2 (two) times daily.   OVER THE COUNTER MEDICATION as needed.   polyethylene glycol (MIRALAX / GLYCOLAX) 17 g packet Take 17 g by mouth as needed.   fluticasone (FLONASE) 50 MCG/ACT nasal spray Place 2 sprays into both nostrils daily. (Patient not taking: No sig reported)   [DISCONTINUED] azithromycin (ZITHROMAX) 250 MG tablet Take 2 tabs by mouth today and then 1 tab once daily for 4 more days.   [DISCONTINUED] benzonatate (TESSALON) 100 MG capsule Take 1 capsule (100 mg total) by mouth 3 (three) times daily as needed.   [DISCONTINUED] fexofenadine (ALLEGRA) 180 MG tablet Take 1 tablet (180 mg total) by mouth 2 (two) times daily.   No facility-administered encounter medications on file as of 09/29/2020.    Allergies (verified) Amoxicillin, Hydroxyzine, and Sulfa antibiotics   History: Past Medical History:  Diagnosis Date   Diabetes mellitus without complication ([facility]  Heart attack ([place]) 09/07/2017   Urticaria    Past Surgical History:  Procedure Laterality Date   ABDOMINAL HYSTERECTOMY  2006   b/l SPO and TAH, uterine cancer   EYE SURGERY Right 1960   TUBAL LIGATION     Family History  Problem Relation Age of Onset   Cancer Mother        liver cancer, breast cancer x 2    Alcohol abuse Mother        smoker   Heart disease Father        MI   Alcohol abuse Father        smoker   Depression Sister    Tremor Sister    Dementia Sister    Cancer Brother        pancreatic    Healthy Son    Diabetes Maternal Grandmother    Hearing loss Maternal Grandmother    Depression Maternal Grandmother    Allergic rhinitis Neg Hx    Angioedema Neg Hx    Asthma Neg Hx    Eczema Neg Hx    Immunodeficiency Neg Hx    Urticaria Neg Hx    Social History   Socioeconomic History   Marital status: Married    Spouse name: Not on file   Number of children: Not on file   Years of education: Not on file   Highest education level: Not on file  Occupational History   Not on file  Tobacco Use   Smoking status: Former    Packs/day: 0.50    Years: 24.00    Pack years: 12.00    Types: Cigarettes    Quit date: 04/17/1987    Years since quitting: 33.4   Smokeless tobacco: Never  Vaping Use   Vaping Use: Never used  Substance and Sexual Activity   Alcohol use: Yes    Comment: occ   Drug use: Not Currently   Sexual activity: Yes  Other Topics Concern   Not on file  Social History Narrative   Not on file   Social Determinants of Health   Financial Resource Strain: Low Risk    Difficulty of Paying Living Expenses: Not hard at all  Food Insecurity: No Food Insecurity   Worried About Charity fundraiser in the Last Year: Never true   Ran Out of Food in the Last Year: Never true  Transportation Needs: No Transportation Needs   Lack of Transportation (Medical): No   Lack of Transportation (Non-Medical): No  Physical Activity: Inactive   Days of Exercise per Week: 0 days   Minutes of Exercise per Session: 0 min  Stress: Stress Concern Present   Feeling of Stress : To some extent  Social Connections: Moderately Isolated   Frequency of Communication with Friends and Family: More than three times a week   Frequency of Social Gatherings with Friends and Family: Once a week   Attends Religious Services: Never   Marine scientist or Organizations: No   Attends Music therapist: Never   Marital Status: Married    Tobacco Counseling Counseling given: Not  Answered   Clinical Intake:  Pre-visit preparation completed: Yes  Pain : 0-10 Pain Score: 8  Pain Type: Chronic pain Pain Location: Arm (right arm & shoulder & left shoulder) Pain Orientation: Right Pain Onset: More than a month ago Pain Frequency: Constant     Nutritional Status: BMI 25 -29 Overweight Nutritional Risks: None Diabetes: Yes CBG done?: No Did pt.  bring in CBG monitor from home?: No  How often do you need to have someone help you when you read instructions, pamphlets, or other written materials from your doctor or pharmacy?: 1 - Never  Diabetes:  Is the patient diabetic?  Yes  If diabetic, was a CBG obtained today?  No  Did the patient bring in their glucometer from home?  No  How often do you monitor your CBG's? occasionally.   Financial Strains and Diabetes Management:  Are you having any financial strains with the device, your supplies or your medication? No .  Does the patient want to be seen by Chronic Can Care Management for management of their diabetes?  No  Would the patient like to be referred to a Nutritionist or for Diabetic Management?  No   Diabetic Exams:  Diabetic Eye Exam: Completed 09/01/2020   Diabetic Foot Exam: Pt has been advised about the importance in completing this exam. To be completed by PCP.    Interpreter Needed?: No  Information entered by :: Caroleen Hamman LPN   Activities of Daily Living In your present state of health, do you have any difficulty performing the following activities: 09/29/2020  Hearing? N  Vision? N  Difficulty concentrating or making decisions? Y  Comment occasionally forgets a word she is trying to think of  Walking or climbing stairs? N  Dressing or bathing? N  Doing errands, shopping? N  Preparing Food and eating ? N  Using the Toilet? N  In the past six months, have you accidently leaked urine? N  Do you have problems with loss of bowel control? N  Managing your Medications? N  Managing  your Finances? N  Housekeeping or managing your Housekeeping? N  Some recent data might be hidden    Patient Care Team: Mosie Lukes, MD as PCP - General (Family Medicine) Lorretta Harp, MD as PCP - Cardiology (Cardiology)  Indicate any recent Medical Services you may have received from other than [facility] providers in the past year (date may be approximate).     Assessment:   This is a routine wellness examination for Vedika.  Hearing/Vision screen Hearing Screening - Comments:: Patient states she has Tinnitis. C/o some hearing loss Vision Screening - Comments:: Wears glasses Last eye exam-2022  Dietary issues and exercise activities discussed: Current Exercise Habits: The patient does not participate in regular exercise at present, Exercise limited by: None identified   Goals Addressed             This Visit's Progress    Increase physical activity   On track      Depression Screen PHQ 2/9 Scores 09/29/2020 09/25/2019 07/22/2018  PHQ - 2 Score 3 0 0  PHQ- 9 Score 5 - -    Fall Risk Fall Risk  09/29/2020 09/25/2019 09/25/2019 07/22/2018  Falls in the past year? 1 (No Data) 1 0  Comment - tripped over dog. - -  Number falls in past yr: 1 - 0 -  Injury with Fall? 1 - 1 -  Risk for fall due to : History of fall(s) - - -  Follow up Falls prevention discussed - Education provided;Falls prevention discussed -    FALL RISK PREVENTION PERTAINING TO THE HOME:  Any stairs in or around the home? Yes  If so, are there any without handrails? No  Home free of loose throw rugs in walkways, pet beds, electrical cords, etc? Yes  Adequate lighting in your home to reduce risk  of falls? Yes   ASSISTIVE DEVICES UTILIZED TO PREVENT FALLS:  Life alert? No  Use of a cane, walker or w/c? No  Grab bars in the bathroom? No  Shower chair or bench in shower? No  Elevated toilet seat or a handicapped toilet? No   TIMED UP AND GO:  Was the test performed? Yes .  Length of time to  ambulate 10 feet: 10 sec.   Gait steady and fast without use of assistive device  Cognitive Function:Patient states she sometimes forgets a word she is trying to think of.      6CIT Screen 09/29/2020  What Year? 0 points  What month? 0 points  What time? 3 points  Count back from 20 0 points  Months in reverse 0 points  Repeat phrase 2 points  Total Score 5    Immunizations Immunization History  Administered Date(s) Administered   Fluad Quad(high Dose 65+) 12/23/2018, 01/22/2020   Influenza, High Dose Seasonal PF 01/18/2015, 02/02/2016, 01/17/2017, 12/31/2017   Influenza,inj,Quad PF,6+ Mos 02/14/2014   Influenza-Unspecified 12/31/2017   PFIZER(Purple Top)SARS-COV-2 Vaccination 06/18/2019, 07/14/2019, 03/09/2020   Pneumococcal Conjugate-13 05/27/2013, 05/27/2013   Pneumococcal Polysaccharide-23 04/16/2014, 04/16/2014   Tdap 07/14/2014, 07/14/2014   Zoster, Live 04/17/2011, 04/17/2011    TDAP status: Up to date  Flu Vaccine status: Up to date  Pneumococcal vaccine status: Up to date  Covid-19 vaccine status: Completed vaccines  Qualifies for Shingles Vaccine? Yes   Zostavax completed Yes   Shingrix Completed?: No.    Education has been provided regarding the importance of this vaccine. Patient has been advised to call insurance company to determine out of pocket expense if they have not yet received this vaccine. Advised may also receive vaccine at local pharmacy or Health Dept. Verbalized acceptance and understanding.  Screening Tests Health Maintenance  Topic Date Due   FOOT EXAM  Never done   Hepatitis C Screening  Never done   Zoster Vaccines- Shingrix (1 of 2) Never done   DEXA SCAN  Never done   OPHTHALMOLOGY EXAM  01/03/2020   COVID-19 Vaccine (4 - Booster for Pfizer series) 07/07/2020   HEMOGLOBIN A1C  07/22/2020   INFLUENZA VACCINE  11/14/2020   TETANUS/TDAP  07/13/2024   COLONOSCOPY (Pts 45-45yr Insurance coverage will need to be confirmed)  04/16/2026    PNA vac Low Risk Adult  Completed   HPV VACCINES  Aged Out    Health Maintenance  Health Maintenance Due  Topic Date Due   FOOT EXAM  Never done   Hepatitis C Screening  Never done   Zoster Vaccines- Shingrix (1 of 2) Never done   DEXA SCAN  Never done   OPHTHALMOLOGY EXAM  01/03/2020   COVID-19 Vaccine (4 - Booster for Pfizer series) 07/07/2020   HEMOGLOBIN A1C  07/22/2020    Colorectal cancer screening: No longer required.   Mammogram status: Ordered today. Pt provided with contact info and advised to call to schedule appt.   Bone Density status: Ordered today. Pt provided with contact info and advised to call to schedule appt.  Lung Cancer Screening: (Low Dose CT Chest recommended if Age 75-80years, 30 pack-year currently smoking OR have quit w/in 15years.) does not qualify.     Additional Screening:  Hepatitis C Screening: does qualify; Discuss with PCP  Vision Screening: Recommended annual ophthalmology exams for early detection of glaucoma and other disorders of the eye. Is the patient up to date with their annual eye exam?  Yes  Who  is the provider or what is the name of the office in which the patient attends annual eye exams? Dr. Amalia Hailey   Dental Screening: Recommended annual dental exams for proper oral hygiene  Community Resource Referral / Chronic Care Management: CRR required this visit?  No   CCM required this visit?  No      Plan:     I have personally reviewed and noted the following in the patient's chart:   Medical and social history Use of alcohol, tobacco or illicit drugs  Current medications and supplements including opioid prescriptions.  Functional ability and status Nutritional status Physical activity Advanced directives List of other physicians Hospitalizations, surgeries, and ER visits in previous 12 months Vitals Screenings to include cognitive, depression, and falls Referrals and appointments  In addition, I have reviewed  and discussed with patient certain preventive protocols, quality metrics, and best practice recommendations. A written personalized care plan for preventive services as well as general preventive health recommendations were provided to patient.     Marta Antu, LPN   5/62/1308  Nurse Health Advisor  Nurse Notes: Patient is having some issues with depression. She has an appt with PCP today.

## 2020-09-29 NOTE — Assessment & Plan Note (Signed)
Tolerating statin, encouraged heart healthy diet, avoid trans fats, minimize simple carbs and saturated fats. Increase exercise as tolerated 

## 2020-09-29 NOTE — Progress Notes (Signed)
Patient ID: Kristina Lee, female    DOB: May 26, 1945  Age: 75 y.o. MRN: [id number]    Subjective:  Subjective  HPI Kristina Lee presents for comprehensive physical exam today and follow up on management of chronic concerns. She reports that the ridges of her palate sometimes get inflamed and irritated which bothers her during her sleep and causes her to wake up in the middle of the night. She states that she has been dealing with coughing for a while now. She expresses interest in finding something to soothe the irritation. She states that she has tried zarbee's baby elderberry. She states that she rarely gets any heartburn, but has had chronic coughing. She denies any chest pain, SOB, fever, abdominal pain, chills, sore throat, dysuria, urinary incontinence, back pain, HA, or N/VD. She states that she has been struggling with constipation for over a year. She states that her bowel movements only happen in the morning and describes them as hard small lumps.  She states that she has tinnitus in her left ear that gets worse when chewing. She states that she can deal with it and does not bother her. She has attempted to get a [employer] electronic hearing aid, but states that the wingtips of it caused her irritation to her external ear bilateral, despite having the wingtips trimmed and cut.   She states that her shins and knees bilateral are getting thinner, which concerns her. 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 She reports feeling itchy in her vaginal area and at the moment denies any rashes. She states that she fell twice: 1st in July 0221 and 2nd around christmas/thanksgiving 2021. She states that the falls caused her bilateral arms and shoulders to hurt.   Review of Systems  Constitutional:  Negative for chills, fatigue and fever.  HENT:  Positive for tinnitus (left ear). Negative for  congestion, rhinorrhea, sinus pressure, sinus pain and sore throat.   Eyes:  Negative for pain.  Respiratory:  Positive for cough. Negative for shortness of breath.   Cardiovascular:  Negative for chest pain, palpitations and leg swelling.  Gastrointestinal:  Positive for constipation (small hard lumps in morning). Negative for abdominal pain, blood in stool, diarrhea, nausea and vomiting.  Genitourinary:  Negative for decreased urine volume, flank pain, frequency, vaginal bleeding and vaginal discharge.  Musculoskeletal:  Positive for arthralgias. Negative for back pain.       (+) bilateral arm and shoulder pain secondary to fall  Neurological:  Negative for headaches.   History Past Medical History:  Diagnosis Date   Diabetes mellitus without complication ([place])    Heart attack ([place]) 09/07/2017   Urticaria     She has a past surgical history that includes Tubal ligation; Abdominal hysterectomy (2006); and Eye surgery (Right, 1960).   Her family history includes Alcohol abuse in her father and mother; Cancer in her brother and mother; Dementia in her sister; Depression in her maternal grandmother and sister; Diabetes in her maternal grandmother; Healthy in her son; Hearing loss in her maternal grandmother; Heart disease in her father; Tremor in her sister.She reports that she quit smoking about 33 years ago. Her smoking use included cigarettes. She has a 12.00 pack-year smoking history. She has never used smokeless tobacco. She reports current alcohol use. She reports previous drug use.  Current Outpatient Medications on File Prior to Visit  Medication Sig Dispense Refill  Accu-Chek Softclix Lancets lancets Use as instructed 100 each 12   Acetaminophen (TYLENOL PO) Take by mouth as needed.     aspirin EC 81 MG tablet Take 81 mg by mouth daily.     atorvastatin (LIPITOR) 80 MG tablet Take 1 tablet (80 mg total) by mouth daily. 90 tablet 1   augmented betamethasone dipropionate  (DIPROLENE-AF) 0.05 % cream Apply topically daily.     blood glucose meter kit and supplies KIT Dispense based on patient and insurance preference. Check blood sugars tid prn Dx; E11.9 1 each 0   Calcium Carbonate-Vit D-Min (CALCIUM 600+D PLUS MINERALS) 600-400 MG-UNIT TABS Take 2 tablets by mouth daily.      clopidogrel (PLAVIX) 75 MG tablet Take 1 tablet (75 mg total) by mouth daily. 90 tablet 3   diclofenac sodium (VOLTAREN) 1 % GEL APPLY 4 G TOPICALLY 4 (FOUR) TIMES DAILY AS NEEDED FOR PAIN 500 g 1   glucose blood (COOL BLOOD GLUCOSE TEST STRIPS) test strip Use as instructedDispense based on patient and insurance preference. Check blood sugars tid prn Dx; E11.9 100 each 12   Lancets MISC Dispense based on patient and insurance preference. Check blood sugars tid prn Dx; E11.9 100 each 3   lisinopril (ZESTRIL) 5 MG tablet Take 1 tablet (5 mg total) by mouth 2 (two) times daily. 180 tablet 0   OVER THE COUNTER MEDICATION as needed.     polyethylene glycol (MIRALAX / GLYCOLAX) 17 g packet Take 17 g by mouth as needed.     fluticasone (FLONASE) 50 MCG/ACT nasal spray Place 2 sprays into both nostrils daily. (Patient not taking: No sig reported) 48 g 1   No current facility-administered medications on file prior to visit.     Objective:  Objective  Physical Exam Constitutional:      General: She is not in acute distress.    Appearance: Normal appearance. She is not ill-appearing or toxic-appearing.  HENT:     Head: Normocephalic and atraumatic.     Right Ear: Tympanic membrane, ear canal and external ear normal.     Left Ear: Tympanic membrane, ear canal and external ear normal.     Nose: No congestion or rhinorrhea.  Eyes:     Extraocular Movements: Extraocular movements intact.     Pupils: Pupils are equal, round, and reactive to light.  Cardiovascular:     Rate and Rhythm: Normal rate and regular rhythm.     Pulses: Normal pulses.     Heart sounds: Normal heart sounds. No murmur  heard. Pulmonary:     Effort: Pulmonary effort is normal. No respiratory distress.     Breath sounds: Normal breath sounds. No wheezing, rhonchi or rales.  Abdominal:     General: Bowel sounds are normal.     Palpations: Abdomen is soft. There is no mass.     Tenderness: no abdominal tenderness There is no guarding.     Hernia: No hernia is present.  Musculoskeletal:        General: Normal range of motion.     Cervical back: Normal range of motion and neck supple.  Skin:    General: Skin is warm and dry.  Neurological:     Mental Status: She is alert and oriented to person, place, and time.  Psychiatric:        Behavior: Behavior normal.   BP 128/72   Pulse 61   Temp 97.9 F (36.6 C)   Resp 16   Ht _0  (  1.575 m)   Wt 140 lb 14 oz (63.9 kg)   LMP  (LMP Unknown)   SpO2 97%   BMI 25.77 kg/m  Wt Readings from Last 3 Encounters:  09/29/20 140 lb 14 oz (63.9 kg)  09/29/20 140 lb 12.8 oz (63.9 kg)  03/15/20 143 lb (64.9 kg)     Lab Results  Component Value Date   WBC 10.5 09/29/2020   HGB 15.0 09/29/2020   HCT 43.4 09/29/2020   PLT 228.0 09/29/2020   GLUCOSE 102 (H) 09/29/2020   CHOL 106 09/29/2020   TRIG 101.0 09/29/2020   HDL 47.10 09/29/2020   LDLCALC 39 09/29/2020   ALT 11 09/29/2020   AST 17 09/29/2020   NA 142 09/29/2020   K 4.0 09/29/2020   CL 105 09/29/2020   CREATININE 0.85 09/29/2020   BUN 20 09/29/2020   CO2 27 09/29/2020   TSH 0.89 09/29/2020   HGBA1C 6.2 09/29/2020    MR Elbow Right w/o contrast  Result Date: 07/18/2019 CLINICAL DATA:  Radial sided elbow pain. EXAM: MRI OF THE RIGHT ELBOW WITHOUT CONTRAST TECHNIQUE: Multiplanar, multisequence MR imaging of the elbow was performed. No intravenous contrast was administered. COMPARISON:  05/21/2019 FINDINGS: TENDONS Common forearm flexor origin: Intact. No tendinopathy or tear. Common forearm extensor origin: Tendinopathy with partial-thickness tearing of the deep attachment fibers. Biceps: Intact.  The brachialis tendon is intact. Triceps: Intact. LIGAMENTS Medial stabilizers: Intact Lateral stabilizers:  Intact Cartilage: Mild degenerative chondrosis but no cartilage defects or osteochondral lesion. Joint: Mild degenerative changes but no joint effusion or loose bodies. Cubital tunnel: Normal appearance of the ulnar nerve. Bones: No acute bony findings. IMPRESSION: 1. Tendinopathy with partial-thickness tearing of the deep attachment fibers of the common extensor tendon. 2. Intact radial and ulnar collateral ligaments. 3. Mild elbow joint degenerative changes but no joint effusion or loose bodies. Electronically Signed   By: Marijo Sanes M.D.   On: 07/18/2019 15:17     Assessment & Plan:  Plan    Meds ordered this encounter  Medications   sucralfate (CARAFATE) 1 GM/10ML suspension    Sig: Take 10 mLs (1 g total) by mouth 4 (four) times daily -  with meals and at bedtime.    Dispense:  420 mL    Refill:  0   DISCONTD: nystatin (MYCOSTATIN) 100000 UNIT/ML suspension    Sig: Take 5 mLs (500,000 Units total) by mouth 4 (four) times daily.    Dispense:  60 mL    Refill:  0   DISCONTD: nystatin-triamcinolone ointment (MYCOLOG)    Sig: Apply 1 application topically 2 (two) times daily.    Dispense:  60 g    Refill:  1   nystatin ointment (MYCOSTATIN)    Sig: Apply 1 application topically 2 (two) times daily.    Dispense:  30 g    Refill:  0    Problem List Items Addressed This Visit     Chronic kidney disease, stage III (moderate) ([place])    Hydrate and monitor       Essential hypertension    Well controlled, no changes to meds. Encouraged heart healthy diet such as the DASH diet and exercise as tolerated.        Relevant Orders   CBC with Differential/Platelet (Completed)   Comprehensive metabolic panel (Completed)   TSH (Completed)   Gastroesophageal reflux disease with esophagitis    She describes some burning and irritation in her throat and some raised ridges in the  room  of her mouth likely caused by silent heartburn. Started on Carafate and offered a referral to GI which she declines for now. Avoid offending foods.        Hyperlipidemia associated with type 2 diabetes mellitus ([place])    Tolerating statin, encouraged heart healthy diet, avoid trans fats, minimize simple carbs and saturated fats. Increase exercise as tolerated       Relevant Orders   Lipid panel (Completed)   Cough    Likely multifactorial at least partially cuased by acid from reflux. Start Famotidine qhs and carafate qid, avoid offending foods and report if any new concerns.        Diabetes mellitus ([place]) - Primary   Relevant Orders   Hemoglobin A1c (Completed)   Seasonal allergic rhinitis due to pollen   Constipation    Encouraged increased hydration and fiber in diet. Daily probiotics. If bowels not moving can use MOM 2 tbls po in 4 oz of warm prune juice by mouth every 2-3 days. If no results then repeat in 4 hours with  Dulcolax suppository pr, may repeat again in 4 more hours as needed. Seek care if symptoms worsen. Consider daily Miralax and/or Dulcolax if symptoms persist.    Add Benefiber twice a day and probiotic of your choice  Next step mix Miralax and Benefiber together once to twice daily notify us if does not improve for referral to gastroenterology       Tinnitus of left ear   Hyperglycemia    hgba1c acceptable, minimize simple carbs. Increase exercise as tolerated.        Preventative health care    Patient encouraged to maintain heart healthy diet, regular exercise, adequate sleep. Consider daily probiotics. Take medications as prescribed. Labs ordered and reviewed. Last colonoscopy in 2018 normal.       Post-menopause    Check a Dexa scan       Candida rash of groin    Started on Nystatin ointment bid and report if any concerns.        Relevant Medications   nystatin ointment (MYCOSTATIN)   Other Visit Diagnoses     Encounter for hepatitis  C screening test for low risk patient       Relevant Orders   Hepatitis C Antibody (Completed)   Estrogen deficiency           Follow-up: Return in about 4 months (around 01/29/2021).   I,David Hanna,acting as a scribe for Penni Homans, MD.,have documented all relevant documentation on the behalf of Penni Homans, MD,as directed by  Penni Homans, MD while in the presence of Penni Homans, MD.  I, Mosie Lukes, MD personally performed the services described in this documentation. All medical record entries made by the scribe were at my direction and in my presence. I have reviewed the chart and agree that the record reflects my personal performance and is accurate and complete

## 2020-09-30 DIAGNOSIS — Z780 Asymptomatic menopausal state: Secondary | ICD-10-CM | POA: Insufficient documentation

## 2020-09-30 DIAGNOSIS — B3789 Other sites of candidiasis: Secondary | ICD-10-CM | POA: Insufficient documentation

## 2020-09-30 LAB — COMPREHENSIVE METABOLIC PANEL
ALT: 11 U/L (ref 0–35)
AST: 17 U/L (ref 0–37)
Albumin: 4.6 g/dL (ref 3.5–5.2)
Alkaline Phosphatase: 76 U/L (ref 39–117)
BUN: 20 mg/dL (ref 6–23)
CO2: 27 mEq/L (ref 19–32)
Calcium: 9.9 mg/dL (ref 8.4–10.5)
Chloride: 105 mEq/L (ref 96–112)
Creatinine, Ser: 0.85 mg/dL (ref 0.40–1.20)
GFR: 67.19 mL/min (ref >60.00)
Glucose, Bld: 102 mg/dL — ABNORMAL HIGH (ref 70–99)
Potassium: 4 mEq/L (ref 3.5–5.1)
Sodium: 142 mEq/L (ref 135–145)
Total Bilirubin: 1.8 mg/dL — ABNORMAL HIGH (ref 0.2–1.2)
Total Protein: 7.2 g/dL (ref 6.0–8.3)

## 2020-09-30 LAB — TSH: TSH: 0.89 u[IU]/mL (ref 0.35–4.50)

## 2020-09-30 LAB — LIPID PANEL
Cholesterol: 106 mg/dL (ref 0–200)
HDL: 47.1 mg/dL (ref >39.00)
LDL Cholesterol: 39 mg/dL (ref 0–99)
NonHDL: 58.77
Total CHOL/HDL Ratio: 2
Triglycerides: 101 mg/dL (ref 0.0–149.0)
VLDL: 20.2 mg/dL (ref 0.0–40.0)

## 2020-09-30 LAB — CBC WITH DIFFERENTIAL/PLATELET
Basophils Absolute: 0 10*3/uL (ref 0.0–0.1)
Basophils Relative: 0.4 % (ref 0.0–3.0)
Eosinophils Absolute: 0.1 10*3/uL (ref 0.0–0.7)
Eosinophils Relative: 1 % (ref 0.0–5.0)
HCT: 43.4 % (ref 36.0–46.0)
Hemoglobin: 15 g/dL (ref 12.0–15.0)
Lymphocytes Relative: 16 % (ref 12.0–46.0)
Lymphs Abs: 1.7 10*3/uL (ref 0.7–4.0)
MCHC: 34.7 g/dL (ref 30.0–36.0)
MCV: 86.7 fl (ref 78.0–100.0)
Monocytes Absolute: 0.5 10*3/uL (ref 0.1–1.0)
Monocytes Relative: 5 % (ref 3.0–12.0)
Neutro Abs: 8.2 10*3/uL — ABNORMAL HIGH (ref 1.4–7.7)
Neutrophils Relative %: 77.6 % — ABNORMAL HIGH (ref 43.0–77.0)
Platelets: 228 10*3/uL (ref 150.0–400.0)
RBC: 5 Mil/uL (ref 3.87–5.11)
RDW: 13.5 % (ref 11.5–15.5)
WBC: 10.5 10*3/uL (ref 4.0–10.5)

## 2020-09-30 LAB — HEPATITIS C ANTIBODY
Hepatitis C Ab: NONREACTIVE
SIGNAL TO CUT-OFF: 0 (ref <1.00)

## 2020-09-30 LAB — HEMOGLOBIN A1C: Hgb A1c MFr Bld: 6.2 % (ref 4.6–6.5)

## 2020-09-30 NOTE — Assessment & Plan Note (Signed)
Started on Nystatin ointment bid and report if any concerns.

## 2020-09-30 NOTE — Assessment & Plan Note (Signed)
She describes some burning and irritation in her throat and some raised ridges in the room of her mouth likely caused by silent heartburn. Started on Carafate and offered a referral to GI which she declines for now. Avoid offending foods.

## 2020-09-30 NOTE — Assessment & Plan Note (Signed)
Check a Dexa scan 

## 2020-09-30 NOTE — Assessment & Plan Note (Signed)
Likely multifactorial at least partially cuased by acid from reflux. Start Famotidine qhs and carafate qid, avoid offending foods and report if any new concerns.

## 2020-11-09 DIAGNOSIS — H2512 Age-related nuclear cataract, left eye: Secondary | ICD-10-CM

## 2020-11-09 HISTORY — DX: Age-related nuclear cataract, left eye: H25.12

## 2020-11-14 ENCOUNTER — Encounter (HOSPITAL_BASED_OUTPATIENT_CLINIC_OR_DEPARTMENT_OTHER): Payer: Self-pay

## 2020-11-14 ENCOUNTER — Other Ambulatory Visit: Payer: Self-pay

## 2020-11-14 ENCOUNTER — Ambulatory Visit (HOSPITAL_BASED_OUTPATIENT_CLINIC_OR_DEPARTMENT_OTHER)
Admission: RE | Admit: 2020-11-14 | Discharge: 2020-11-14 | Disposition: A | Discharge status: Home / Self Care | Payer: Medicare PPO | Source: Ambulatory Visit | Attending: Family Medicine | Admitting: Family Medicine

## 2020-11-14 DIAGNOSIS — Z1231 Encounter for screening mammogram for malignant neoplasm of breast: Secondary | ICD-10-CM | POA: Insufficient documentation

## 2020-11-14 DIAGNOSIS — M8588 Other specified disorders of bone density and structure, other site: Secondary | ICD-10-CM | POA: Diagnosis not present

## 2020-11-14 DIAGNOSIS — Z780 Asymptomatic menopausal state: Secondary | ICD-10-CM | POA: Diagnosis not present

## 2020-12-16 DIAGNOSIS — H53031 Strabismic amblyopia, right eye: Secondary | ICD-10-CM | POA: Diagnosis not present

## 2020-12-16 DIAGNOSIS — H52203 Unspecified astigmatism, bilateral: Secondary | ICD-10-CM | POA: Diagnosis not present

## 2020-12-16 DIAGNOSIS — H25013 Cortical age-related cataract, bilateral: Secondary | ICD-10-CM | POA: Diagnosis not present

## 2020-12-16 DIAGNOSIS — H11151 Pinguecula, right eye: Secondary | ICD-10-CM | POA: Diagnosis not present

## 2020-12-16 DIAGNOSIS — H43813 Vitreous degeneration, bilateral: Secondary | ICD-10-CM | POA: Diagnosis not present

## 2020-12-16 DIAGNOSIS — H5203 Hypermetropia, bilateral: Secondary | ICD-10-CM | POA: Diagnosis not present

## 2020-12-16 DIAGNOSIS — H0100A Unspecified blepharitis right eye, upper and lower eyelids: Secondary | ICD-10-CM | POA: Diagnosis not present

## 2020-12-16 DIAGNOSIS — H2513 Age-related nuclear cataract, bilateral: Secondary | ICD-10-CM | POA: Diagnosis not present

## 2020-12-16 DIAGNOSIS — H0100B Unspecified blepharitis left eye, upper and lower eyelids: Secondary | ICD-10-CM | POA: Diagnosis not present

## 2020-12-27 DIAGNOSIS — H25811 Combined forms of age-related cataract, right eye: Secondary | ICD-10-CM | POA: Diagnosis not present

## 2020-12-27 DIAGNOSIS — H2513 Age-related nuclear cataract, bilateral: Secondary | ICD-10-CM | POA: Diagnosis not present

## 2020-12-27 DIAGNOSIS — H2511 Age-related nuclear cataract, right eye: Secondary | ICD-10-CM | POA: Diagnosis not present

## 2021-01-17 DIAGNOSIS — D485 Neoplasm of uncertain behavior of skin: Secondary | ICD-10-CM | POA: Diagnosis not present

## 2021-01-17 DIAGNOSIS — I781 Nevus, non-neoplastic: Secondary | ICD-10-CM | POA: Diagnosis not present

## 2021-01-17 DIAGNOSIS — L820 Inflamed seborrheic keratosis: Secondary | ICD-10-CM | POA: Diagnosis not present

## 2021-01-17 DIAGNOSIS — X32XXXS Exposure to sunlight, sequela: Secondary | ICD-10-CM | POA: Diagnosis not present

## 2021-01-17 DIAGNOSIS — L814 Other melanin hyperpigmentation: Secondary | ICD-10-CM | POA: Diagnosis not present

## 2021-01-17 DIAGNOSIS — L578 Other skin changes due to chronic exposure to nonionizing radiation: Secondary | ICD-10-CM | POA: Diagnosis not present

## 2021-01-17 DIAGNOSIS — L298 Other pruritus: Secondary | ICD-10-CM | POA: Diagnosis not present

## 2021-02-08 ENCOUNTER — Other Ambulatory Visit: Payer: Self-pay | Admitting: Family Medicine

## 2021-03-14 ENCOUNTER — Other Ambulatory Visit: Payer: Self-pay

## 2021-03-14 ENCOUNTER — Ambulatory Visit: Payer: Medicare PPO | Admitting: Family Medicine

## 2021-03-14 ENCOUNTER — Encounter: Payer: Self-pay | Admitting: Family Medicine

## 2021-03-14 VITALS — BP 124/68 | HR 74 | Temp 98.4°F | Resp 16 | Ht 62.0 in | Wt 145.0 lb

## 2021-03-14 DIAGNOSIS — I10 Essential (primary) hypertension: Secondary | ICD-10-CM

## 2021-03-14 DIAGNOSIS — F4321 Adjustment disorder with depressed mood: Secondary | ICD-10-CM | POA: Insufficient documentation

## 2021-03-14 DIAGNOSIS — E1169 Type 2 diabetes mellitus with other specified complication: Secondary | ICD-10-CM

## 2021-03-14 DIAGNOSIS — N183 Chronic kidney disease, stage 3 unspecified: Secondary | ICD-10-CM | POA: Diagnosis not present

## 2021-03-14 DIAGNOSIS — F430 Acute stress reaction: Secondary | ICD-10-CM

## 2021-03-14 DIAGNOSIS — R4789 Other speech disturbances: Secondary | ICD-10-CM | POA: Diagnosis not present

## 2021-03-14 DIAGNOSIS — E785 Hyperlipidemia, unspecified: Secondary | ICD-10-CM | POA: Diagnosis not present

## 2021-03-14 DIAGNOSIS — I2510 Atherosclerotic heart disease of native coronary artery without angina pectoris: Secondary | ICD-10-CM | POA: Diagnosis not present

## 2021-03-14 DIAGNOSIS — R739 Hyperglycemia, unspecified: Secondary | ICD-10-CM

## 2021-03-14 DIAGNOSIS — K5900 Constipation, unspecified: Secondary | ICD-10-CM | POA: Diagnosis not present

## 2021-03-14 DIAGNOSIS — H9190 Unspecified hearing loss, unspecified ear: Secondary | ICD-10-CM

## 2021-03-14 HISTORY — DX: Adjustment disorder with depressed mood: F43.21

## 2021-03-14 MED ORDER — DICLOFENAC SODIUM 1 % EX GEL: 4.0000 g | Freq: Four times a day (QID) | CUTANEOUS | 2 refills | Status: DC | PRN

## 2021-03-14 NOTE — Assessment & Plan Note (Signed)
She got hearing aides but they caused a very annoying noise and would irritate her skin. She cannot wear them.

## 2021-03-14 NOTE — Assessment & Plan Note (Signed)
Will refer to neuropsychology for testing. She has had significant stressors lately so she is unclear what the cause is

## 2021-03-14 NOTE — Assessment & Plan Note (Signed)
Tolerating statin, encouraged heart healthy diet, avoid trans fats, minimize simple carbs and saturated fats. Increase exercise as tolerated 

## 2021-03-14 NOTE — Assessment & Plan Note (Signed)
hgba1c acceptable, minimize simple carbs. Increase exercise as tolerated. Continue current meds 

## 2021-03-14 NOTE — Assessment & Plan Note (Signed)
Is hard and difficult to pass at times but today she had some diarrhea which is unusual. Has not recurred. No significant pattern. No bloody or tarry stool.

## 2021-03-14 NOTE — Assessment & Plan Note (Signed)
Well controlled, no changes to meds. Encouraged heart healthy diet such as the DASH diet and exercise as tolerated.  °

## 2021-03-14 NOTE — Assessment & Plan Note (Signed)
hgba1c acceptable, minimize simple carbs. Increase exercise as tolerated.  

## 2021-03-14 NOTE — Assessment & Plan Note (Signed)
Hydrate and monitor 

## 2021-03-14 NOTE — Assessment & Plan Note (Addendum)
Has lost numerous good friends in this year and now her adult son is divorced and living with them and his kids come to stay with them over the weekends. She is struggling with sadness of this. Her 75 year old [place] Retriever died this past summer. For now she feels she is managing adequately for now. She will let us know if she changes her mind

## 2021-03-14 NOTE — Patient Instructions (Addendum)
Lively hearing aides are online  Alternate Lidocaine gel with Voltaren gel, ice and heat to manage pain  Consider Physical therapy or Ortho eval Consider counseling   Arthritis Arthritis is a term that is commonly used to refer to joint pain or joint disease. There are more than 100 types of arthritis. What are the causes? The most common cause of this condition is wear and tear of a joint. Other causes include: Gout. Inflammation of a joint. An infection of a joint. Sprains and other injuries near the joint. A reaction to medicines or drugs, or an allergic reaction. In some cases, the cause may not be known. What are the signs or symptoms? The main symptom of this condition is pain in the joint during movement. Other symptoms include: Redness, swelling, or stiffness at a joint. Warmth coming from the joint. Fever. Overall feeling of illness. How is this diagnosed? This condition may be diagnosed with a physical exam and tests, including: Blood tests. Urine tests. Imaging tests, such as X-rays, an MRI, or a CT scan. Sometimes, fluid is removed from a joint for testing. How is this treated? This condition may be treated with: Treatment of the cause, if it is known. Rest. Raising (elevating) the joint. Applying cold or hot packs to the joint. Medicines to improve symptoms and reduce inflammation. Injections of a steroid such as cortisone into the joint to help reduce pain and inflammation. Depending on the cause of your arthritis, you may need to make lifestyle changes to reduce stress on your joint. Changes may include: Exercising more. Losing weight. Follow these instructions at home: Medicines Take over-the-counter and prescription medicines only as told by your health care provider. Do not take aspirin to relieve pain if your health care provider thinks that gout may be causing your pain. Activity Rest your joint if told by your health care provider. Rest is important  when your disease is active and your joint feels painful, swollen, or stiff. Avoid activities that make the pain worse. It is important to balance activity with rest. Exercise your joint regularly with range-of-motion exercises as told by your health care provider. Try doing low-impact exercise, such as: Swimming. Water aerobics. Biking. Walking. Managing pain, stiffness, and swelling   If directed, put ice on the joint. Put ice in a plastic bag. Place a towel between your skin and the bag. Leave the ice on for 20 minutes, 2-3 times per day. If your joint is swollen, raise (elevate) it above the level of your heart if directed by your health care provider. If your joint feels stiff in the morning, try taking a warm shower. If directed, apply heat to the affected area as often as told by your health care provider. Use the heat source that your health care provider recommends, such as a moist heat pack or a heating pad. If you have diabetes, do not apply heat without permission from your health care provider. To apply heat: Place a towel between your skin and the heat source. Leave the heat on for 20-30 minutes. Remove the heat if your skin turns bright red. This is especially important if you are unable to feel pain, heat, or cold. You may have a greater risk of getting burned. General instructions Do not use any products that contain nicotine or tobacco, such as cigarettes, e-cigarettes, and chewing tobacco. If you need help quitting, ask your health care provider. Keep all follow-up visits as told by your health care provider. This is important. Contact  a health care provider if: The pain gets worse. You have a fever. Get help right away if: You develop severe joint pain, swelling, or redness. Many joints become painful and swollen. You develop severe back pain. You develop severe weakness in your leg. You cannot control your bladder or bowels. Summary Arthritis is a term that is  commonly used to refer to joint pain or joint disease. There are more than 100 types of arthritis. The most common cause of this condition is wear and tear of a joint. Other causes include gout, inflammation or infection of the joint, sprains, or allergies. Symptoms of this condition include redness, swelling, or stiffness of the joint. Other symptoms include warmth, fever, or feeling ill. This condition is treated with rest, elevation, medicines, and applying cold or hot packs. Follow your health care provider's instructions about medicines, activity, exercises, and other home care treatments. This information is not intended to replace advice given to you by your health care provider. Make sure you discuss any questions you have with your health care provider. Document Revised: 03/10/2018 Document Reviewed: 03/10/2018 Elsevier Patient Education  2022 [employer].

## 2021-03-15 LAB — CBC
HCT: 41.5 % (ref 36.0–46.0)
Hemoglobin: 14.2 g/dL (ref 12.0–15.0)
MCHC: 34.1 g/dL (ref 30.0–36.0)
MCV: 86.1 fl (ref 78.0–100.0)
Platelets: 206 10*3/uL (ref 150.0–400.0)
RBC: 4.82 Mil/uL (ref 3.87–5.11)
RDW: 13.4 % (ref 11.5–15.5)
WBC: 9.4 10*3/uL (ref 4.0–10.5)

## 2021-03-15 LAB — LIPID PANEL
Cholesterol: 111 mg/dL (ref 0–200)
HDL: 42.3 mg/dL (ref >39.00)
LDL Cholesterol: 39 mg/dL (ref 0–99)
NonHDL: 68.44
Total CHOL/HDL Ratio: 3
Triglycerides: 148 mg/dL (ref 0.0–149.0)
VLDL: 29.6 mg/dL (ref 0.0–40.0)

## 2021-03-15 LAB — COMPREHENSIVE METABOLIC PANEL
ALT: 15 U/L (ref 0–35)
AST: 20 U/L (ref 0–37)
Albumin: 4.3 g/dL (ref 3.5–5.2)
Alkaline Phosphatase: 59 U/L (ref 39–117)
BUN: 20 mg/dL (ref 6–23)
CO2: 27 mEq/L (ref 19–32)
Calcium: 9.4 mg/dL (ref 8.4–10.5)
Chloride: 105 mEq/L (ref 96–112)
Creatinine, Ser: 0.86 mg/dL (ref 0.40–1.20)
GFR: 66.04 mL/min (ref >60.00)
Glucose, Bld: 173 mg/dL — ABNORMAL HIGH (ref 70–99)
Potassium: 3.6 mEq/L (ref 3.5–5.1)
Sodium: 141 mEq/L (ref 135–145)
Total Bilirubin: 1.6 mg/dL — ABNORMAL HIGH (ref 0.2–1.2)
Total Protein: 6.6 g/dL (ref 6.0–8.3)

## 2021-03-15 LAB — HEMOGLOBIN A1C: Hgb A1c MFr Bld: 6.3 % (ref 4.6–6.5)

## 2021-03-15 LAB — TSH: TSH: 0.89 u[IU]/mL (ref 0.35–5.50)

## 2021-03-15 NOTE — Progress Notes (Signed)
Subjective:    Patient ID: Eriyana Sweeten, female    DOB: 1945-10-11, 75 y.o.   MRN: [id number]  Chief Complaint  Patient presents with   4 months follow up    HPI Patient is in today for follow up on chronic medical concerns. No recent febrile illness or hospitalizations. She has been under a great deal of stress loosing numerous friends and a beloved pet recently. She notes more anxiety and some increased memory concerns. Notes some word finding difficulties with the increased the stress. Denies CP/palp/SOB/HA/congestion/fevers/GI or GU c/o. Taking meds as prescribed   Past Medical History:  Diagnosis Date   Diabetes mellitus without complication ([place])    Heart attack ([place]) 09/07/2017   Urticaria     Past Surgical History:  Procedure Laterality Date   ABDOMINAL HYSTERECTOMY  2006   b/l SPO and TAH, uterine cancer   EYE SURGERY Right 1960   TUBAL LIGATION      Family History  Problem Relation Age of Onset   Cancer Mother        liver cancer, breast cancer x 2    Alcohol abuse Mother        smoker   Heart disease Father        MI   Alcohol abuse Father        smoker   Depression Sister    Tremor Sister    Dementia Sister    Cancer Brother        pancreatic   Healthy Son    Diabetes Maternal Grandmother    Hearing loss Maternal Grandmother    Depression Maternal Grandmother    Allergic rhinitis Neg Hx    Angioedema Neg Hx    Asthma Neg Hx    Eczema Neg Hx    Immunodeficiency Neg Hx    Urticaria Neg Hx     Social History   Socioeconomic History   Marital status: Married    Spouse name: Not on file   Number of children: Not on file   Years of education: Not on file   Highest education level: Not on file  Occupational History   Not on file  Tobacco Use   Smoking status: Former    Packs/day: 0.50    Years: 24.00    Pack years: 12.00    Types: Cigarettes    Quit date: 04/17/1987    Years since quitting: 33.9   Smokeless tobacco: Never  Vaping Use    Vaping Use: Never used  Substance and Sexual Activity   Alcohol use: Yes    Comment: occ   Drug use: Not Currently   Sexual activity: Yes  Other Topics Concern   Not on file  Social History Narrative   Not on file   Social Determinants of Health   Financial Resource Strain: Low Risk    Difficulty of Paying Living Expenses: Not hard at all  Food Insecurity: No Food Insecurity   Worried About Charity fundraiser in the Last Year: Never true   Ran Out of Food in the Last Year: Never true  Transportation Needs: No Transportation Needs   Lack of Transportation (Medical): No   Lack of Transportation (Non-Medical): No  Physical Activity: Inactive   Days of Exercise per Week: 0 days   Minutes of Exercise per Session: 0 min  Stress: Stress Concern Present   Feeling of Stress : To some extent  Social Connections: Moderately Isolated   Frequency of Communication with Friends and  Family: More than three times a week   Frequency of Social Gatherings with Friends and Family: Once a week   Attends Religious Services: Never   Marine scientist or Organizations: No   Attends Music therapist: Never   Marital Status: Married  Human resources officer Violence: Not At Risk   Fear of Current or Ex-Partner: No   Emotionally Abused: No   Physically Abused: No   Sexually Abused: No    Outpatient Medications Prior to Visit  Medication Sig Dispense Refill   Acetaminophen (TYLENOL PO) Take by mouth as needed.     Calcium Carbonate-Vit D-Min (CALCIUM 600+D PLUS MINERALS) 600-400 MG-UNIT TABS Take 2 tablets by mouth daily.      clopidogrel (PLAVIX) 75 MG tablet Take 1 tablet (75 mg total) by mouth daily. 90 tablet 3   fluticasone (FLONASE) 50 MCG/ACT nasal spray Place 2 sprays into both nostrils daily. 48 g 1   OVER THE COUNTER MEDICATION as needed.     polyethylene glycol (MIRALAX / GLYCOLAX) 17 g packet Take 17 g by mouth as needed.     sucralfate (CARAFATE) 1 GM/10ML suspension  Take 10 mLs (1 g total) by mouth 4 (four) times daily -  with meals and at bedtime. 420 mL 0   diclofenac sodium (VOLTAREN) 1 % GEL APPLY 4 G TOPICALLY 4 (FOUR) TIMES DAILY AS NEEDED FOR PAIN 500 g 1   lisinopril (ZESTRIL) 5 MG tablet TAKE 1 TABLET BY MOUTH TWICE A DAY 180 tablet 0   Accu-Chek Softclix Lancets lancets Use as instructed 100 each 12   aspirin EC 81 MG tablet Take 81 mg by mouth daily.     atorvastatin (LIPITOR) 80 MG tablet Take 1 tablet (80 mg total) by mouth daily. 90 tablet 1   augmented betamethasone dipropionate (DIPROLENE-AF) 0.05 % cream Apply topically daily.     blood glucose meter kit and supplies KIT Dispense based on patient and insurance preference. Check blood sugars tid prn Dx; E11.9 1 each 0   glucose blood (COOL BLOOD GLUCOSE TEST STRIPS) test strip Use as instructedDispense based on patient and insurance preference. Check blood sugars tid prn Dx; E11.9 100 each 12   Lancets MISC Dispense based on patient and insurance preference. Check blood sugars tid prn Dx; E11.9 100 each 3   nystatin ointment (MYCOSTATIN) Apply 1 application topically 2 (two) times daily. 30 g 0   No facility-administered medications prior to visit.    Allergies  Allergen Reactions   Amoxicillin Anaphylaxis   Hydroxyzine Anaphylaxis   Sulfa Antibiotics Swelling    Rash      Review of Systems  Constitutional:  Positive for malaise/fatigue. Negative for fever.  HENT:  Positive for hearing loss. Negative for congestion.   Eyes:  Negative for blurred vision.  Respiratory:  Negative for shortness of breath.   Cardiovascular:  Negative for chest pain, palpitations and leg swelling.  Gastrointestinal:  Negative for abdominal pain, blood in stool and nausea.  Genitourinary:  Negative for dysuria and frequency.  Musculoskeletal:  Negative for falls.  Skin:  Negative for rash.  Neurological:  Negative for dizziness, loss of consciousness and headaches.  Endo/Heme/Allergies:  Negative for  environmental allergies.  Psychiatric/Behavioral:  Positive for depression. The patient is nervous/anxious.       Objective:    Physical Exam Constitutional:      General: She is not in acute distress.    Appearance: She is well-developed.  HENT:  Head: Normocephalic and atraumatic.  Eyes:     Conjunctiva/sclera: Conjunctivae normal.  Neck:     Thyroid: No thyromegaly.  Cardiovascular:     Rate and Rhythm: Normal rate and regular rhythm.     Heart sounds: Normal heart sounds. No murmur heard. Pulmonary:     Effort: Pulmonary effort is normal. No respiratory distress.     Breath sounds: Normal breath sounds.  Abdominal:     General: Bowel sounds are normal. There is no distension.     Palpations: Abdomen is soft. There is no mass.     Tenderness: There is no abdominal tenderness.  Musculoskeletal:     Cervical back: Neck supple.  Lymphadenopathy:     Cervical: No cervical adenopathy.  Skin:    General: Skin is warm and dry.  Neurological:     Mental Status: She is alert and oriented to person, place, and time.  Psychiatric:        Behavior: Behavior normal.    BP 124/68   Pulse 74   Temp 98.4 F (36.9 C)   Resp 16   Ht _0  (1.575 m)   Wt 145 lb (65.8 kg)   LMP  (LMP Unknown)   SpO2 96%   BMI 26.52 kg/m  Wt Readings from Last 3 Encounters:  03/14/21 145 lb (65.8 kg)  09/29/20 140 lb 14 oz (63.9 kg)  09/29/20 140 lb 12.8 oz (63.9 kg)    Diabetic Foot Exam - Simple   No data filed    Lab Results  Component Value Date   WBC 10.5 09/29/2020   HGB 15.0 09/29/2020   HCT 43.4 09/29/2020   PLT 228.0 09/29/2020   GLUCOSE 102 (H) 09/29/2020   CHOL 106 09/29/2020   TRIG 101.0 09/29/2020   HDL 47.10 09/29/2020   LDLCALC 39 09/29/2020   ALT 11 09/29/2020   AST 17 09/29/2020   NA 142 09/29/2020   K 4.0 09/29/2020   CL 105 09/29/2020   CREATININE 0.85 09/29/2020   BUN 20 09/29/2020   CO2 27 09/29/2020   TSH 0.89 09/29/2020   HGBA1C 6.2 09/29/2020     Lab Results  Component Value Date   TSH 0.89 09/29/2020   Lab Results  Component Value Date   WBC 10.5 09/29/2020   HGB 15.0 09/29/2020   HCT 43.4 09/29/2020   MCV 86.7 09/29/2020   PLT 228.0 09/29/2020   Lab Results  Component Value Date   NA 142 09/29/2020   K 4.0 09/29/2020   CO2 27 09/29/2020   GLUCOSE 102 (H) 09/29/2020   BUN 20 09/29/2020   CREATININE 0.85 09/29/2020   BILITOT 1.8 (H) 09/29/2020   ALKPHOS 76 09/29/2020   AST 17 09/29/2020   ALT 11 09/29/2020   PROT 7.2 09/29/2020   ALBUMIN 4.6 09/29/2020   CALCIUM 9.9 09/29/2020   ANIONGAP 11 03/12/2018   GFR 67.19 09/29/2020   Lab Results  Component Value Date   CHOL 106 09/29/2020   Lab Results  Component Value Date   HDL 47.10 09/29/2020   Lab Results  Component Value Date   LDLCALC 39 09/29/2020   Lab Results  Component Value Date   TRIG 101.0 09/29/2020   Lab Results  Component Value Date   CHOLHDL 2 09/29/2020   Lab Results  Component Value Date   HGBA1C 6.2 09/29/2020       Assessment & Plan:   Problem List Items Addressed This Visit     Chronic kidney disease, stage  III (moderate) ([place])    Hydrate and monitor      Essential hypertension    Well controlled, no changes to meds. Encouraged heart healthy diet such as the DASH diet and exercise as tolerated.       Relevant Orders   CBC   TSH   Hyperlipidemia associated with type 2 diabetes mellitus ([place])    Tolerating statin, encouraged heart healthy diet, avoid trans fats, minimize simple carbs and saturated fats. Increase exercise as tolerated      Relevant Orders   Comprehensive metabolic panel   Lipid panel   CAD (coronary artery disease)   Hearing loss    She got hearing aides but they caused a very annoying noise and would irritate her skin. She cannot wear them.       Constipation    Is hard and difficult to pass at times but today she had some diarrhea which is unusual. Has not recurred. No significant pattern.  No bloody or tarry stool.       Hyperglycemia    hgba1c acceptable, minimize simple carbs. Increase exercise as tolerated.      Relevant Orders   Hemoglobin A1c   Stress reaction    Has lost numerous good friends in this year and now her adult son is divorced and living with them and his kids come to stay with them over the weekends. She is struggling with sadness of this. Her 75 year old [place] Retriever died this past summer. For now she feels she is managing adequately for now. She will let us know if she changes her mind      Word finding difficulty - Primary    Will refer to neuropsychology for testing. She has had significant stressors lately so she is unclear what the cause is       Relevant Orders   Ambulatory referral to Neuropsychology   RESOLVED: Diabetes mellitus ([place])    hgba1c acceptable, minimize simple carbs. Increase exercise as tolerated. Continue current meds       I have discontinued Charlett [employer] "Judy"'s diclofenac sodium, blood glucose meter kit and supplies, Cool Blood Glucose Test Strips, Lancets, nystatin ointment, and lisinopril. I am also having her start on diclofenac Sodium. Additionally, I am having her maintain her aspirin EC, Calcium 600+D Plus Minerals, fluticasone, Accu-Chek Softclix Lancets, Acetaminophen (TYLENOL PO), OVER THE COUNTER MEDICATION, polyethylene glycol, augmented betamethasone dipropionate, clopidogrel, atorvastatin, and sucralfate.  Meds ordered this encounter  Medications   diclofenac Sodium (VOLTAREN) 1 % GEL    Sig: Apply 4 g topically 4 (four) times daily as needed.    Dispense:  300 g    Refill:  2     Penni Homans, MD

## 2021-03-16 ENCOUNTER — Other Ambulatory Visit: Payer: Self-pay | Admitting: Family Medicine

## 2021-03-17 ENCOUNTER — Ambulatory Visit: Payer: Medicare PPO | Admitting: Cardiovascular Disease

## 2021-03-17 ENCOUNTER — Other Ambulatory Visit: Payer: Self-pay

## 2021-03-17 ENCOUNTER — Encounter: Payer: Self-pay | Admitting: Cardiovascular Disease

## 2021-03-17 VITALS — BP 142/78 | HR 56 | Ht 62.0 in | Wt 143.4 lb

## 2021-03-17 DIAGNOSIS — I10 Essential (primary) hypertension: Secondary | ICD-10-CM

## 2021-03-17 DIAGNOSIS — E1169 Type 2 diabetes mellitus with other specified complication: Secondary | ICD-10-CM

## 2021-03-17 DIAGNOSIS — E785 Hyperlipidemia, unspecified: Secondary | ICD-10-CM

## 2021-03-17 DIAGNOSIS — I2510 Atherosclerotic heart disease of native coronary artery without angina pectoris: Secondary | ICD-10-CM

## 2021-03-17 NOTE — Assessment & Plan Note (Signed)
History of essential hypertension with blood pressure measured today at 152/80.  She is on no antihypertensive medications.

## 2021-03-17 NOTE — Assessment & Plan Note (Signed)
History of hyperlipidemia on statin therapy with lipid profile performed 09/29/2020 revealing total cholesterol 106, LDL 39 and HDL 47.

## 2021-03-17 NOTE — Patient Instructions (Signed)

## 2021-03-17 NOTE — Progress Notes (Signed)
03/17/2021 [facility]   1945/05/08  [id number]  Primary Physician Mosie Lukes, MD Primary Cardiologist: Lorretta Harp MD Lupe Carney, [place]  HPI:  Kristina Lee is a 75 y.o.  thin appearing married Caucasian female mother of one biologic child, grandmother to 5 grandchildren who worked as a Production manager.  She was referred by [facility] for cardiovascular evaluation evaluation and to be established in my practice because of her recent non-STEMI and intervention.  I last saw her in the office 03/10/2019.  Her Risk factors include remote tobacco abuse having quit on 01/04/1988 after smoking 25 pack years.  She has treated hypertension, hyperlipidemia and diabetes which is not treated.  Her father did die of a microinfarction at age 68.  She had atypical symptoms leading up to a non-STEMI 09/02/2017 she was in [place] and had stenting of her proximal LAD via her right radial approach.  She had no other significant CAD.  EF was in the 45 to 50% range.  She is had no recurrent symptoms.  She remains on aspirin and Brilinta.  She had a 2D echo performed 11/05/2017 which was entirely normal.   Since I saw her in the office 2 years ago she is remained stable.  Her son and his 4 children moved into her house creating some anxiety.  3 of her 4 best friends have died since I last saw her in the fourth has dementia unfortunately.  She denies chest pain or shortness of breath.     Current Meds  Medication Sig   Accu-Chek Softclix Lancets lancets Use as instructed   Acetaminophen (TYLENOL PO) Take by mouth as needed.   aspirin EC 81 MG tablet Take 81 mg by mouth daily.   atorvastatin (LIPITOR) 80 MG tablet TAKE 1 TABLET BY MOUTH EVERY DAY   augmented betamethasone dipropionate (DIPROLENE-AF) 0.05 % cream Apply topically daily.   Calcium Carbonate-Vit D-Min (CALCIUM 600+D PLUS MINERALS) 600-400 MG-UNIT TABS Take 2 tablets by mouth daily.    clopidogrel (PLAVIX)  75 MG tablet Take 1 tablet (75 mg total) by mouth daily.   diclofenac Sodium (VOLTAREN) 1 % GEL Apply 4 g topically 4 (four) times daily as needed.   fluticasone (FLONASE) 50 MCG/ACT nasal spray Place 2 sprays into both nostrils daily.   OVER THE COUNTER MEDICATION as needed.   polyethylene glycol (MIRALAX / GLYCOLAX) 17 g packet Take 17 g by mouth as needed.   sucralfate (CARAFATE) 1 GM/10ML suspension Take 10 mLs (1 g total) by mouth 4 (four) times daily -  with meals and at bedtime.     Allergies  Allergen Reactions   Amoxicillin Anaphylaxis   Hydroxyzine Anaphylaxis   Sulfa Antibiotics Swelling    Rash      Social History   Socioeconomic History   Marital status: Married    Spouse name: Not on file   Number of children: Not on file   Years of education: Not on file   Highest education level: Not on file  Occupational History   Not on file  Tobacco Use   Smoking status: Former    Packs/day: 0.50    Years: 24.00    Pack years: 12.00    Types: Cigarettes    Quit date: 04/17/1987    Years since quitting: 33.9   Smokeless tobacco: Never  Vaping Use   Vaping Use: Never used  Substance and Sexual Activity   Alcohol use: Yes  Comment: occ   Drug use: Not Currently   Sexual activity: Yes  Other Topics Concern   Not on file  Social History Narrative   Not on file   Social Determinants of Health   Financial Resource Strain: Low Risk    Difficulty of Paying Living Expenses: Not hard at all  Food Insecurity: No Food Insecurity   Worried About Programme researcher, broadcasting/film/video in the Last Year: Never true   Ran Out of Food in the Last Year: Never true  Transportation Needs: No Transportation Needs   Lack of Transportation (Medical): No   Lack of Transportation (Non-Medical): No  Physical Activity: Inactive   Days of Exercise per Week: 0 days   Minutes of Exercise per Session: 0 min  Stress: Stress Concern Present   Feeling of Stress : To some extent  Social Connections:  Moderately Isolated   Frequency of Communication with Friends and Family: More than three times a week   Frequency of Social Gatherings with Friends and Family: Once a week   Attends Religious Services: Never   Database administrator or Organizations: No   Attends Engineer, structural: Never   Marital Status: Married  Catering manager Violence: Not At Risk   Fear of Current or Ex-Partner: No   Emotionally Abused: No   Physically Abused: No   Sexually Abused: No     Review of Systems: General: negative for chills, fever, night sweats or weight changes.  Cardiovascular: negative for chest pain, dyspnea on exertion, edema, orthopnea, palpitations, paroxysmal nocturnal dyspnea or shortness of breath Dermatological: negative for rash Respiratory: negative for cough or wheezing Urologic: negative for hematuria Abdominal: negative for nausea, vomiting, diarrhea, bright red blood per rectum, melena, or hematemesis Neurologic: negative for visual changes, syncope, or dizziness All other systems reviewed and are otherwise negative except as noted above.    Blood pressure (!) 152/80, pulse (!) 56, height 5\' 2"  (1.575 m), weight 143 lb 6.4 oz (65 kg), SpO2 98 %.  General appearance: alert and no distress Neck: no adenopathy, no carotid bruit, no JVD, supple, symmetrical, trachea midline, and thyroid not enlarged, symmetric, no tenderness/mass/nodules Lungs: clear to auscultation bilaterally Heart: regular rate and rhythm, S1, S2 normal, no murmur, click, rub or gallop Extremities: extremities normal, atraumatic, no cyanosis or edema Pulses: 2+ and symmetric Skin: Skin color, texture, turgor normal. No rashes or lesions Neurologic: Grossly normal  EKG sinus bradycardia 56 without ST or T wave changes.  I personally reviewed this EKG.  ASSESSMENT AND PLAN:   Essential hypertension History of essential hypertension with blood pressure measured today at 152/80.  She is on no  antihypertensive medications.  Hyperlipidemia associated with type 2 diabetes mellitus (HCC) History of hyperlipidemia on statin therapy with lipid profile performed 09/29/2020 revealing total cholesterol 106, LDL 39 and HDL 47.  CAD (coronary artery disease) History of CAD status post non-STEMI 09/02/2017 while in 09/04/2017.  She underwent LAD stenting via the right radial approach.  She remains on clopidogrel.  She denies chest pain or shortness of breath.     [place] MD FACP,FACC,FAHA, [facility] 03/17/2021 11:04 AM

## 2021-03-17 NOTE — Assessment & Plan Note (Signed)
History of CAD status post non-STEMI 09/02/2017 while in [place].  She underwent LAD stenting via the right radial approach.  She remains on clopidogrel.  She denies chest pain or shortness of breath.

## 2021-03-23 DIAGNOSIS — L2089 Other atopic dermatitis: Secondary | ICD-10-CM | POA: Diagnosis not present

## 2021-03-29 ENCOUNTER — Other Ambulatory Visit: Payer: Self-pay | Admitting: Family Medicine

## 2021-03-29 ENCOUNTER — Telehealth: Payer: Self-pay | Admitting: Family Medicine

## 2021-03-29 DIAGNOSIS — H269 Unspecified cataract: Secondary | ICD-10-CM

## 2021-03-29 NOTE — Telephone Encounter (Signed)
Pt called and stated she would like a new referral for a Eye dr for her cataracts. She was seeing Dr.Evans but she would not like to see him again.

## 2021-04-19 ENCOUNTER — Other Ambulatory Visit: Payer: Self-pay | Admitting: Family Medicine

## 2021-05-06 ENCOUNTER — Other Ambulatory Visit: Payer: Self-pay | Admitting: Family Medicine

## 2021-05-09 ENCOUNTER — Ambulatory Visit: Payer: Medicare PPO | Admitting: Family

## 2021-05-09 VITALS — BP 166/78 | HR 65 | Temp 98.5°F | Resp 16 | Wt 141.0 lb

## 2021-05-09 DIAGNOSIS — E785 Hyperlipidemia, unspecified: Secondary | ICD-10-CM | POA: Diagnosis not present

## 2021-05-09 DIAGNOSIS — E1165 Type 2 diabetes mellitus with hyperglycemia: Secondary | ICD-10-CM | POA: Diagnosis not present

## 2021-05-09 DIAGNOSIS — F01518 Vascular dementia, unspecified severity, with other behavioral disturbance: Secondary | ICD-10-CM

## 2021-05-09 DIAGNOSIS — I10 Essential (primary) hypertension: Secondary | ICD-10-CM

## 2021-05-09 DIAGNOSIS — E1169 Type 2 diabetes mellitus with other specified complication: Secondary | ICD-10-CM | POA: Diagnosis not present

## 2021-05-09 DIAGNOSIS — R739 Hyperglycemia, unspecified: Secondary | ICD-10-CM | POA: Diagnosis not present

## 2021-05-09 MED ORDER — LISINOPRIL 20 MG PO TABS: 20.0000 mg | ORAL_TABLET | Freq: Every day | ORAL | 0 refills | Status: DC

## 2021-05-09 NOTE — Progress Notes (Signed)
Subjective:     Patient ID: Kristina Lee, female    DOB: [birth year], 76 y.o.   MRN: [id number]  Chief Complaint  Patient presents with   Hypertension    Here for follow up    HPI Patient is in today for follow up.  She notes a lot of stress over the last year with friends who have passed.   HTN- bp meds include lisinopril 5mg  bid.   BP Readings from Last 3 Encounters:  05/09/21 (!) 166/78  03/17/21 (!) 142/78  03/14/21 124/68   Lab Results  Component Value Date   HGBA1C 6.3 03/14/2021    Hyperlipidemia- maintained on atorvastatin 80mg .  Lab Results  Component Value Date   CHOL 111 03/14/2021   HDL 42.30 03/14/2021   LDLCALC 39 03/14/2021   TRIG 148.0 03/14/2021   CHOLHDL 3 03/14/2021   Lab Results  Component Value Date   HGBA1C 6.3 03/14/2021   Memory loss- It looks like PCP placed referral to neuropsych last visit and they reached out to her but she did not return their call.     Health Maintenance Due  Topic Date Due   FOOT EXAM  Never done   Zoster Vaccines- Shingrix (1 of 2) Never done   OPHTHALMOLOGY EXAM  01/03/2020   COVID-19 Vaccine (4 - Booster for Pfizer series) 05/04/2020    Past Medical History:  Diagnosis Date   Diabetes mellitus without complication (HCC)    Heart attack (HCC) 09/07/2017   Urticaria     Past Surgical History:  Procedure Laterality Date   ABDOMINAL HYSTERECTOMY  2006   b/l SPO and TAH, uterine cancer   EYE SURGERY Right 1960   TUBAL LIGATION      Family History  Problem Relation Age of Onset   Cancer Mother        liver cancer, breast cancer x 2    Alcohol abuse Mother        smoker   Heart disease Father        MI   Alcohol abuse Father        smoker   Depression Sister    Tremor Sister    Dementia Sister    Cancer Brother        pancreatic   Healthy Son    Diabetes Maternal Grandmother    Hearing loss Maternal Grandmother    Depression Maternal Grandmother    Allergic rhinitis Neg Hx     Angioedema Neg Hx    Asthma Neg Hx    Eczema Neg Hx    Immunodeficiency Neg Hx    Urticaria Neg Hx     Social History   Socioeconomic History   Marital status: Married    Spouse name: Not on file   Number of children: Not on file   Years of education: Not on file   Highest education level: Not on file  Occupational History   Not on file  Tobacco Use   Smoking status: Former    Packs/day: 0.50    Years: 24.00    Pack years: 12.00    Types: Cigarettes    Quit date: 04/17/1987    Years since quitting: 34.0   Smokeless tobacco: Never  Vaping Use   Vaping Use: Never used  Substance and Sexual Activity   Alcohol use: Yes    Comment: occ   Drug use: Not Currently   Sexual activity: Yes  Other Topics Concern   Not on file  Social  History Narrative   Not on file   Social Determinants of Health   Financial Resource Strain: Low Risk    Difficulty of Paying Living Expenses: Not hard at all  Food Insecurity: No Food Insecurity   Worried About Programme researcher, broadcasting/film/video in the Last Year: Never true   Ran Out of Food in the Last Year: Never true  Transportation Needs: No Transportation Needs   Lack of Transportation (Medical): No   Lack of Transportation (Non-Medical): No  Physical Activity: Inactive   Days of Exercise per Week: 0 days   Minutes of Exercise per Session: 0 min  Stress: Stress Concern Present   Feeling of Stress : To some extent  Social Connections: Moderately Isolated   Frequency of Communication with Friends and Family: More than three times a week   Frequency of Social Gatherings with Friends and Family: Once a week   Attends Religious Services: Never   Database administrator or Organizations: No   Attends Engineer, structural: Never   Marital Status: Married  Catering manager Violence: Not At Risk   Fear of Current or Ex-Partner: No   Emotionally Abused: No   Physically Abused: No   Sexually Abused: No    Outpatient Medications Prior to Visit   Medication Sig Dispense Refill   Accu-Chek Softclix Lancets lancets Use as instructed 100 each 12   Acetaminophen (TYLENOL PO) Take by mouth as needed.     aspirin EC 81 MG tablet Take 81 mg by mouth daily.     atorvastatin (LIPITOR) 80 MG tablet TAKE 1 TABLET BY MOUTH EVERY DAY 90 tablet 1   augmented betamethasone dipropionate (DIPROLENE-AF) 0.05 % cream Apply topically daily.     Calcium Carbonate-Vit D-Min (CALCIUM 600+D PLUS MINERALS) 600-400 MG-UNIT TABS Take 2 tablets by mouth daily.      clopidogrel (PLAVIX) 75 MG tablet TAKE 1 TABLET BY MOUTH EVERY DAY 90 tablet 3   diclofenac Sodium (VOLTAREN) 1 % GEL Apply 4 g topically 4 (four) times daily as needed. 300 g 2   fluticasone (FLONASE) 50 MCG/ACT nasal spray Place 2 sprays into both nostrils daily. 48 g 1   OVER THE COUNTER MEDICATION as needed.     polyethylene glycol (MIRALAX / GLYCOLAX) 17 g packet Take 17 g by mouth as needed.     sucralfate (CARAFATE) 1 GM/10ML suspension Take 10 mLs (1 g total) by mouth 4 (four) times daily -  with meals and at bedtime. 420 mL 0   No facility-administered medications prior to visit.    Allergies  Allergen Reactions   Amoxicillin Anaphylaxis   Hydroxyzine Anaphylaxis   Sulfa Antibiotics Swelling    Rash      ROS     Objective:    Physical Exam Constitutional:      General: She is not in acute distress.    Appearance: Normal appearance. She is well-developed.  HENT:     Head: Normocephalic and atraumatic.     Right Ear: External ear normal.     Left Ear: External ear normal.  Eyes:     General: No scleral icterus. Neck:     Thyroid: No thyromegaly.  Cardiovascular:     Rate and Rhythm: Normal rate and regular rhythm.     Heart sounds: Normal heart sounds. No murmur heard. Pulmonary:     Effort: Pulmonary effort is normal. No respiratory distress.     Breath sounds: Normal breath sounds. No wheezing.  Musculoskeletal:  Cervical back: Neck supple.  Skin:    General:  Skin is warm and dry.  Neurological:     Mental Status: She is alert.     Comments: Patient seems to have difficulty grasping simple concepts for example got confused when I said she was taking 10mg  of lisinopril a day (5 + 5).    Seems to get hung up remembering a word or name on almost every sentence.   Psychiatric:        Mood and Affect: Mood normal.    BP (!) 166/78 (BP Location: Left Arm, Patient Position: Sitting, Cuff Size: Small)    Pulse 65    Temp 98.5 F (36.9 C) (Oral)    Resp 16    Wt 141 lb (64 kg)    LMP  (LMP Unknown)    SpO2 99%    BMI 25.79 kg/m  Wt Readings from Last 3 Encounters:  05/09/21 141 lb (64 kg)  03/17/21 143 lb 6.4 oz (65 kg)  03/14/21 145 lb (65.8 kg)       Assessment & Plan:   Problem List Items Addressed This Visit       Unprioritized   Vascular dementia with behavior disturbance    Will see if her husband can help her get set up with Neuropsychology.       Hyperlipidemia associated with type 2 diabetes mellitus (HCC)    LDL at goal. Continue atorvastatin.       Relevant Medications   lisinopril (ZESTRIL) 20 MG tablet   Hyperglycemia    Last A1C <2 months ago was 6.3. Continue diabetic diet.       Essential hypertension - Primary    Uncontrolled. Change lisinopril from 5mg  bid to 20mg  once daily.       Relevant Medications   lisinopril (ZESTRIL) 20 MG tablet   Other Relevant Orders   Basic metabolic panel    I am having 03/16/21 A. Meroney " " maintain her aspirin EC, Calcium 600+D Plus Minerals, fluticasone, Accu-Chek Softclix Lancets, Acetaminophen (TYLENOL PO), OVER THE COUNTER MEDICATION, polyethylene glycol, augmented betamethasone dipropionate, sucralfate, diclofenac Sodium, atorvastatin, clopidogrel, and lisinopril.  Meds ordered this encounter  Medications   DISCONTD: lisinopril (ZESTRIL) 20 MG tablet    Sig: Take 1 tablet (20 mg total) by mouth daily.    Dispense:  90 tablet    Refill:  0    Order Specific Question:    Supervising Provider    Answer:   A [[id number]]   lisinopril (ZESTRIL) 20 MG tablet    Sig: Take 1 tablet (20 mg total) by mouth daily.    Dispense:  90 tablet    Refill:  0

## 2021-05-09 NOTE — Patient Instructions (Addendum)
Please change lisinopril to 20mg  once daily.

## 2021-05-10 ENCOUNTER — Telehealth: Payer: Self-pay | Admitting: Family

## 2021-05-10 NOTE — Telephone Encounter (Signed)
Pt called back, gave her the number as well.

## 2021-05-10 NOTE — Telephone Encounter (Signed)
Spoke to [name] and advised of situation and provided phone number for [facility].

## 2021-05-10 NOTE — Assessment & Plan Note (Signed)
Uncontrolled. Change lisinopril from 5mg  bid to 20mg  once daily.

## 2021-05-10 NOTE — Assessment & Plan Note (Signed)
LDL at goal. Continue atorvastatin.  

## 2021-05-10 NOTE — Assessment & Plan Note (Signed)
Last A1C <2 months ago was 6.3. Continue diabetic diet.

## 2021-05-10 NOTE — Assessment & Plan Note (Signed)
Will see if her husband can help her get set up with Neuropsychology.

## 2021-05-10 NOTE — Telephone Encounter (Signed)
Please contact pt's husband and give him the number for [facility]. Dr. Abner Greenspan referred her to Dr. Milbert Coulter for neuropsych testing but she never returned their call. I am concerned about her memory. Can he help her get this set up and bring her to the appointment? tks

## 2021-06-02 ENCOUNTER — Encounter: Payer: Self-pay | Admitting: Psychology

## 2021-06-23 DIAGNOSIS — H2512 Age-related nuclear cataract, left eye: Secondary | ICD-10-CM | POA: Diagnosis not present

## 2021-06-23 DIAGNOSIS — Z961 Presence of intraocular lens: Secondary | ICD-10-CM | POA: Diagnosis not present

## 2021-06-23 LAB — HM DIABETES EYE EXAM

## 2021-07-07 ENCOUNTER — Encounter: Payer: Self-pay | Admitting: Family Medicine

## 2021-07-07 ENCOUNTER — Ambulatory Visit: Payer: Medicare PPO | Admitting: Family Medicine

## 2021-07-07 VITALS — BP 148/79 | HR 77 | Temp 98.8°F | Ht 62.0 in | Wt 139.6 lb

## 2021-07-07 DIAGNOSIS — J301 Allergic rhinitis due to pollen: Secondary | ICD-10-CM

## 2021-07-07 MED ORDER — MONTELUKAST SODIUM 10 MG PO TABS: 10.0000 mg | ORAL_TABLET | Freq: Every day | ORAL | 3 refills | Status: DC

## 2021-07-07 MED ORDER — FLUTICASONE PROPIONATE 50 MCG/ACT NA SUSP: 2.0000 | Freq: Every day | NASAL | 1 refills | Status: DC

## 2021-07-07 NOTE — Progress Notes (Addendum)
Chief Complaint  ?Patient presents with  ? Cough  ? sinus congestion  ? Headache  ? ? ?Kristina Lee here for URI complaints. She is alone and is a poor historian.  ? ?Duration: 2-3 months ?Associated symptoms: sinus headache, sinus congestion, rhinorrhea, and coughing ?Denies: sinus pain, itchy watery eyes, ear pain, ear drainage, sore throat, wheezing, shortness of breath, and fevers ?Treatment to date: it is unclear if she has taken anything. She did not give me an answer and follow up questioning resulted in a tangential answer and another answer about how it was the same as what she answered earlier (referring to a hearing aid) ?Sick contacts: No ? ?Past Medical History:  ?Diagnosis Date  ? Diabetes mellitus without complication (HCC)   ? Heart attack (HCC) 09/07/2017  ? Urticaria   ? ? ?Objective ?BP (!) 148/79   Pulse 77   Temp 98.8 ?F (37.1 ?C)   Ht 5\' 2"  (1.575 m)   Wt 139 lb 9.6 oz (63.3 kg)   LMP  (LMP Unknown)   SpO2 98%   BMI 25.53 kg/m?  ?General: Awake, alert, appears stated age ?HEENT: AT, [place], ears patent b/l and TM's neg, nares patent w/o discharge, pharynx pink and without exudates, MMM ?Neck: No masses or asymmetry ?Heart: RRR ?Lungs: CTAB, no accessory muscle use ?Psych: limited judgment and insight, normal mood and affect ? ?Allergic rhinitis due to pollen, unspecified seasonality - Plan: fluticasone (FLONASE) 50 MCG/ACT nasal spray, montelukast (SINGULAIR) 10 MG tablet ? ?Anaphylaxis to Vistaril. Singulair and INCS. F/u in 3-4 weeks.  ?F/u prn. If starting to experience fevers, shaking, or shortness of breath, seek immediate care. ?Pt voiced understanding and agreement to the plan. ? ? , DO ?07/07/21 ?2:16 PM ? ?

## 2021-07-07 NOTE — Patient Instructions (Addendum)
Continue to push fluids, practice good hand hygiene, and cover your mouth if you cough. ? ?If you start having fevers, shaking or shortness of breath, seek immediate care. ? ?With this going on for 2-3 months, it is unlike to be an infection.  ? ?OK to take Tylenol 1000 mg (2 extra strength tabs) or 975 mg (3 regular strength tabs) every 6 hours as needed. ? ?Let us know if you need anything. ?

## 2021-07-11 ENCOUNTER — Other Ambulatory Visit: Payer: Self-pay

## 2021-07-11 ENCOUNTER — Telehealth: Payer: Self-pay | Admitting: Family Medicine

## 2021-07-11 DIAGNOSIS — I10 Essential (primary) hypertension: Secondary | ICD-10-CM

## 2021-07-11 MED ORDER — LISINOPRIL 20 MG PO TABS: 20.0000 mg | ORAL_TABLET | Freq: Every day | ORAL | 1 refills | Status: DC

## 2021-07-11 NOTE — Telephone Encounter (Signed)
Talked to pt, sent refill in to preferred pharmacy. ?

## 2021-07-11 NOTE — Telephone Encounter (Signed)
Patient is out of her lisinopril medication, she would like to know if she is supposed to get a refill and continue taking it or stop taking it. Please advise.  ?

## 2021-08-01 ENCOUNTER — Ambulatory Visit: Payer: Medicare PPO | Admitting: Family

## 2021-08-01 VITALS — BP 158/78 | HR 62 | Temp 98.5°F | Resp 16 | Wt 139.0 lb

## 2021-08-01 DIAGNOSIS — L659 Nonscarring hair loss, unspecified: Secondary | ICD-10-CM

## 2021-08-01 DIAGNOSIS — F01518 Vascular dementia, unspecified severity, with other behavioral disturbance: Secondary | ICD-10-CM

## 2021-08-01 DIAGNOSIS — I10 Essential (primary) hypertension: Secondary | ICD-10-CM | POA: Diagnosis not present

## 2021-08-01 DIAGNOSIS — J301 Allergic rhinitis due to pollen: Secondary | ICD-10-CM

## 2021-08-01 MED ORDER — AMLODIPINE BESYLATE 5 MG PO TABS: 5.0000 mg | ORAL_TABLET | Freq: Every day | ORAL | 3 refills | Status: DC

## 2021-08-01 NOTE — Progress Notes (Signed)
? ?Subjective:  ? ?By signing my name below, I, Cassell Clement, attest that this documentation has been prepared under the direction and in the presence of Sandford Craze NP, 08/01/2021 ? ? Patient ID: Kristina Lee, female    DOB: Mar 25, 1946, 76 y.o.   MRN: [id number] ? ?Chief Complaint  ?Patient presents with  ? Alopecia  ?  Complains of loosing, thinning hair  ? Nasal Congestion  ?  Complains of nasal congestion on left side of face  ?  Trouble talking  ?  Patient complains of not being able to finish sentences at times, hard to remember things  ? ? ?HPI ?Patient is in today for an office visit. ? ?Refill - She is requesting a refill of 20 MG of Lisinopril. ? ?Blood Pressure - As of today's visit, her blood pressure is increasing. She is taking 20 MG of Lisinopril. ?BP Readings from Last 3 Encounters:  ?08/01/21 (!) 158/78  ?07/07/21 (!) 148/79  ?05/09/21 (!) 166/78  ? ?Memory Loss - She complains of memory loss. She stated that she recently forgot who her husband was. She didn't recognize her friends or her home. She states that her prospective memory is declining. She commutes by herself. She is scheduled to see Dr. Milbert Coulter for her neurological concerns.  ? ?Hair Thinning - She complains of hair thinning. After she showers, she notices bumps on top of her head. She notes that she has a family history of hair loss. She regularly sees Dr. Katrinka Blazing for dermatology concerns.  ? ?Nasal Congestion - She complains of nasal congestion that has been occurring for years. She states that her left nostril are more stuffy than her right nostril. She has been using 50 MCG/ACT of Flonase.  ? ?Health Maintenance Due  ?Topic Date Due  ? FOOT EXAM  Never done  ? Zoster Vaccines- Shingrix (1 of 2) Never done  ? COVID-19 Vaccine (4 - Booster for Pfizer series) 05/04/2020  ? ? ?Past Medical History:  ?Diagnosis Date  ? Diabetes mellitus without complication (HCC)   ? Heart attack (HCC) 09/07/2017  ? Urticaria   ? ? ?Past Surgical  History:  ?Procedure Laterality Date  ? ABDOMINAL HYSTERECTOMY  2006  ? b/l SPO and TAH, uterine cancer  ? EYE SURGERY Right 1960  ? TUBAL LIGATION    ? ? ?Family History  ?Problem Relation Age of Onset  ? Cancer Mother   ?     liver cancer, breast cancer x 2   ? Alcohol abuse Mother   ?     smoker  ? Heart disease Father   ?     MI  ? Alcohol abuse Father   ?     smoker  ? Depression Sister   ? Tremor Sister   ? Dementia Sister   ? Cancer Brother   ?     pancreatic  ? Healthy Son   ? Diabetes Maternal Grandmother   ? Hearing loss Maternal Grandmother   ? Depression Maternal Grandmother   ? Allergic rhinitis Neg Hx   ? Angioedema Neg Hx   ? Asthma Neg Hx   ? Eczema Neg Hx   ? Immunodeficiency Neg Hx   ? Urticaria Neg Hx   ? ? ?Social History  ? ?Socioeconomic History  ? Marital status: Married  ?  Spouse name: Not on file  ? Number of children: Not on file  ? Years of education: Not on file  ? Highest education level: Not on  file  ?Occupational History  ? Not on file  ?Tobacco Use  ? Smoking status: Former  ?  Packs/day: 0.50  ?  Years: 24.00  ?  Pack years: 12.00  ?  Types: Cigarettes  ?  Quit date: 04/17/1987  ?  Years since quitting: 34.3  ? Smokeless tobacco: Never  ?Vaping Use  ? Vaping Use: Never used  ?Substance and Sexual Activity  ? Alcohol use: Yes  ?  Comment: occ  ? Drug use: Not Currently  ? Sexual activity: Yes  ?Other Topics Concern  ? Not on file  ?Social History Narrative  ? Not on file  ? ?Social Determinants of Health  ? ?Financial Resource Strain: Low Risk   ? Difficulty of Paying Living Expenses: Not hard at all  ?Food Insecurity: No Food Insecurity  ? Worried About Programme researcher, broadcasting/film/video in the Last Year: Never true  ? Ran Out of Food in the Last Year: Never true  ?Transportation Needs: No Transportation Needs  ? Lack of Transportation (Medical): No  ? Lack of Transportation (Non-Medical): No  ?Physical Activity: Inactive  ? Days of Exercise per Week: 0 days  ? Minutes of Exercise per Session: 0 min   ?Stress: Stress Concern Present  ? Feeling of Stress : To some extent  ?Social Connections: Moderately Isolated  ? Frequency of Communication with Friends and Family: More than three times a week  ? Frequency of Social Gatherings with Friends and Family: Once a week  ? Attends Religious Services: Never  ? Active Member of Clubs or Organizations: No  ? Attends Banker Meetings: Never  ? Marital Status: Married  ?Intimate Partner Violence: Not At Risk  ? Fear of Current or Ex-Partner: No  ? Emotionally Abused: No  ? Physically Abused: No  ? Sexually Abused: No  ? ? ?Outpatient Medications Prior to Visit  ?Medication Sig Dispense Refill  ? Accu-Chek Softclix Lancets lancets Use as instructed 100 each 12  ? Acetaminophen (TYLENOL PO) Take by mouth as needed.    ? aspirin EC 81 MG tablet Take 81 mg by mouth daily.    ? atorvastatin (LIPITOR) 80 MG tablet TAKE 1 TABLET BY MOUTH EVERY DAY 90 tablet 1  ? augmented betamethasone dipropionate (DIPROLENE-AF) 0.05 % cream Apply topically daily.    ? Calcium Carbonate-Vit D-Min (CALCIUM 600+D PLUS MINERALS) 600-400 MG-UNIT TABS Take 2 tablets by mouth daily.     ? clopidogrel (PLAVIX) 75 MG tablet TAKE 1 TABLET BY MOUTH EVERY DAY 90 tablet 3  ? diclofenac Sodium (VOLTAREN) 1 % GEL Apply 4 g topically 4 (four) times daily as needed. 300 g 2  ? fluticasone (FLONASE) 50 MCG/ACT nasal spray Place 2 sprays into both nostrils daily. 48 g 1  ? lisinopril (ZESTRIL) 20 MG tablet Take 1 tablet (20 mg total) by mouth daily. 90 tablet 1  ? montelukast (SINGULAIR) 10 MG tablet Take 1 tablet (10 mg total) by mouth at bedtime. 30 tablet 3  ? OVER THE COUNTER MEDICATION as needed.    ? polyethylene glycol (MIRALAX / GLYCOLAX) 17 g packet Take 17 g by mouth as needed.    ? sucralfate (CARAFATE) 1 GM/10ML suspension Take 10 mLs (1 g total) by mouth 4 (four) times daily -  with meals and at bedtime. 420 mL 0  ? ?No facility-administered medications prior to visit.  ? ? ?Allergies   ?Allergen Reactions  ? Amoxicillin Anaphylaxis  ? Hydroxyzine Anaphylaxis  ? Sulfa Antibiotics Swelling  ?  Rash  ?  ? ? ?Review of Systems  ?HENT:  Positive for congestion.   ?     (+) Hair Thinning  ?Psychiatric/Behavioral:  Positive for memory loss.   ? ?   ?Objective:  ?  ?Physical Exam ?Constitutional:   ?   General: She is not in acute distress. ?   Appearance: Normal appearance. She is not ill-appearing.  ?HENT:  ?   Head: Normocephalic and atraumatic.  ?   Right Ear: External ear normal.  ?   Left Ear: External ear normal.  ?Eyes:  ?   Extraocular Movements: Extraocular movements intact.  ?   Pupils: Pupils are equal, round, and reactive to light.  ?Cardiovascular:  ?   Rate and Rhythm: Normal rate and regular rhythm.  ?   Heart sounds: Normal heart sounds. No murmur heard. ?  No gallop.  ?Pulmonary:  ?   Effort: Pulmonary effort is normal. No respiratory distress.  ?   Breath sounds: Normal breath sounds. No wheezing or rales.  ?Skin: ?   General: Skin is warm and dry.  ?Neurological:  ?   Mental Status: She is alert.  ?   Comments: Some difficulty with recall and word finding noted  ?Psychiatric:     ?   Mood and Affect: Mood normal.     ?   Behavior: Behavior normal.     ?   Judgment: Judgment normal.  ? ? ?BP (!) 158/78 (BP Location: Right Arm, Patient Position: Sitting, Cuff Size: Small)   Pulse 62   Temp 98.5 ?F (36.9 ?C) (Oral)   Resp 16   Wt 139 lb (63 kg)   LMP  (LMP Unknown)   SpO2 100%   BMI 25.42 kg/m?  ?Wt Readings from Last 3 Encounters:  ?08/01/21 139 lb (63 kg)  ?07/07/21 139 lb 9.6 oz (63.3 kg)  ?05/09/21 141 lb (64 kg)  ? ? ?   ?Assessment & Plan:  ? ?Problem List Items Addressed This Visit   ? ?  ? Unprioritized  ? Vascular dementia with behavior disturbance (HCC)  ?  Scored 23 on MMSE. She states that she recently did not recognize her husband. She is here today on her own. She has formal neuropsychological evaluation scheduled in June.  I would like her to keep this appointment  and I also advised her that I do not think it is safe for her to continue to drive. She states she gets lonely during the day as her husband works.   ? ?I contacted her husband and relayed medical plan and my r

## 2021-08-01 NOTE — Patient Instructions (Addendum)
Add claritin once daily for runny nose. ?Add amlodipine once daily for blood pressure. ?Do not drive due to your memory issues.  ?

## 2021-08-01 NOTE — Assessment & Plan Note (Addendum)
Scored 23 on MMSE. She states that she recently did not recognize her husband. She is here today on her own. She has formal neuropsychological evaluation scheduled in June.  I would like her to keep this appointment and I also advised her that I do not think it is safe for her to continue to drive. She states she gets lonely during the day as her husband works.   ? ?I contacted her husband and relayed medical plan and my recommendation that she stop driving.  I suggested that he consider looking into an adult daycare program to keep her safe/stimulated and engaged during the day.  He verbalized understanding and was appreciative of our help.  ? ? ?  08/01/2021  ?  3:45 PM  ?MMSE - Mini Mental State Exam  ?Orientation to time 3  ?Orientation to Place 2  ?Registration 3  ?Attention/ Calculation 5  ?Recall 2  ?Language- name 2 objects 2  ?Language- repeat 1  ?Language- follow 3 step command 3  ?Language- read & follow direction 1  ?Write a sentence 1  ?Copy design 0  ?Total score 23  ? ? ?

## 2021-08-01 NOTE — Assessment & Plan Note (Addendum)
BP Readings from Last 3 Encounters:  ?08/01/21 (!) 158/78  ?07/07/21 (!) 148/79  ?05/09/21 (!) 166/78  ? ?BP uncontrolled. Continue lisinopril add amlodipine 5mg  once daily.  ? ?

## 2021-08-01 NOTE — Assessment & Plan Note (Signed)
Uncontrolled. Recommended that she add claritin 10mg once daily.  

## 2021-08-01 NOTE — Assessment & Plan Note (Signed)
Recommended that pt follow up with her dermatologist.  ?

## 2021-08-17 DIAGNOSIS — L570 Actinic keratosis: Secondary | ICD-10-CM | POA: Diagnosis not present

## 2021-08-17 DIAGNOSIS — D1801 Hemangioma of skin and subcutaneous tissue: Secondary | ICD-10-CM | POA: Diagnosis not present

## 2021-08-17 DIAGNOSIS — L2089 Other atopic dermatitis: Secondary | ICD-10-CM | POA: Diagnosis not present

## 2021-08-17 DIAGNOSIS — D485 Neoplasm of uncertain behavior of skin: Secondary | ICD-10-CM | POA: Diagnosis not present

## 2021-08-17 DIAGNOSIS — Z85828 Personal history of other malignant neoplasm of skin: Secondary | ICD-10-CM | POA: Diagnosis not present

## 2021-08-17 DIAGNOSIS — L659 Nonscarring hair loss, unspecified: Secondary | ICD-10-CM | POA: Diagnosis not present

## 2021-08-17 DIAGNOSIS — L821 Other seborrheic keratosis: Secondary | ICD-10-CM | POA: Diagnosis not present

## 2021-09-01 ENCOUNTER — Other Ambulatory Visit: Payer: Self-pay | Admitting: Family Medicine

## 2021-09-05 DIAGNOSIS — L578 Other skin changes due to chronic exposure to nonionizing radiation: Secondary | ICD-10-CM | POA: Diagnosis not present

## 2021-09-05 DIAGNOSIS — W908XXS Exposure to other nonionizing radiation, sequela: Secondary | ICD-10-CM | POA: Diagnosis not present

## 2021-09-06 ENCOUNTER — Other Ambulatory Visit: Payer: Self-pay | Admitting: Family Medicine

## 2021-09-12 ENCOUNTER — Encounter: Payer: Self-pay | Admitting: Family Medicine

## 2021-09-12 DIAGNOSIS — I10 Essential (primary) hypertension: Secondary | ICD-10-CM

## 2021-09-12 MED ORDER — LISINOPRIL 20 MG PO TABS: 20.0000 mg | ORAL_TABLET | Freq: Every day | ORAL | 1 refills | Status: DC

## 2021-09-19 ENCOUNTER — Telehealth: Payer: Self-pay | Admitting: Family Medicine

## 2021-09-19 NOTE — Telephone Encounter (Signed)
Pt's husband states pt is declining and is having a lot of trouble communicating/understanding. He asked if he would be able to talk with Dr.Blyth about this and go into more detail. Please advise.

## 2021-09-19 NOTE — Telephone Encounter (Signed)
Called pt's husband. No answer, left VM for return call.

## 2021-09-21 DIAGNOSIS — H53031 Strabismic amblyopia, right eye: Secondary | ICD-10-CM | POA: Diagnosis not present

## 2021-09-21 DIAGNOSIS — E113292 Type 2 diabetes mellitus with mild nonproliferative diabetic retinopathy without macular edema, left eye: Secondary | ICD-10-CM | POA: Diagnosis not present

## 2021-09-21 DIAGNOSIS — H2512 Age-related nuclear cataract, left eye: Secondary | ICD-10-CM | POA: Diagnosis not present

## 2021-09-21 DIAGNOSIS — W908XXS Exposure to other nonionizing radiation, sequela: Secondary | ICD-10-CM | POA: Diagnosis not present

## 2021-09-21 DIAGNOSIS — H50111 Monocular exotropia, right eye: Secondary | ICD-10-CM | POA: Diagnosis not present

## 2021-09-21 DIAGNOSIS — L578 Other skin changes due to chronic exposure to nonionizing radiation: Secondary | ICD-10-CM | POA: Diagnosis not present

## 2021-09-21 DIAGNOSIS — H5021 Vertical strabismus, right eye: Secondary | ICD-10-CM | POA: Diagnosis not present

## 2021-09-21 LAB — HM DIABETES EYE EXAM

## 2021-10-02 ENCOUNTER — Other Ambulatory Visit: Payer: Self-pay | Admitting: Family Medicine

## 2021-10-02 DIAGNOSIS — J301 Allergic rhinitis due to pollen: Secondary | ICD-10-CM

## 2021-10-04 ENCOUNTER — Encounter: Payer: Self-pay | Admitting: Psychology

## 2021-10-05 ENCOUNTER — Ambulatory Visit: Payer: Medicare PPO

## 2021-10-05 ENCOUNTER — Ambulatory Visit: Payer: Medicare PPO | Admitting: Psychology

## 2021-10-05 ENCOUNTER — Encounter: Payer: Self-pay | Admitting: Psychology

## 2021-10-05 DIAGNOSIS — R4189 Other symptoms and signs involving cognitive functions and awareness: Secondary | ICD-10-CM

## 2021-10-05 DIAGNOSIS — F02A0 Dementia in other diseases classified elsewhere, mild, without behavioral disturbance, psychotic disturbance, mood disturbance, and anxiety: Secondary | ICD-10-CM

## 2021-10-05 DIAGNOSIS — F4321 Adjustment disorder with depressed mood: Secondary | ICD-10-CM

## 2021-10-05 DIAGNOSIS — G309 Alzheimer's disease, unspecified: Secondary | ICD-10-CM

## 2021-10-05 DIAGNOSIS — G3184 Mild cognitive impairment, so stated: Secondary | ICD-10-CM

## 2021-10-05 DIAGNOSIS — F03A0 Unspecified dementia, mild, without behavioral disturbance, psychotic disturbance, mood disturbance, and anxiety: Secondary | ICD-10-CM

## 2021-10-05 HISTORY — DX: Mild cognitive impairment of uncertain or unknown etiology: G31.84

## 2021-10-05 HISTORY — DX: Unspecified dementia, mild, without behavioral disturbance, psychotic disturbance, mood disturbance, and anxiety: F03.A0

## 2021-10-05 NOTE — Progress Notes (Signed)
   Psychometrician Note   Cognitive testing was administered to Kristina Lee by Shan Levans, B.S. (psychometrist) under the supervision of Dr. Newman Nickels, Ph.D., licensed psychologist on 10/05/2021. [name] did not appear overtly distressed by the testing session per behavioral observation or responses across self-report questionnaires. Rest breaks were offered.    The battery of tests administered was selected by Dr. Newman Nickels, Ph.D. with consideration to [name]'s current level of functioning, the nature of her symptoms, emotional and behavioral responses during interview, level of literacy, observed level of motivation/effort, and the nature of the referral question. This battery was communicated to the psychometrist. Communication between Dr. Newman Nickels, Ph.D. and the psychometrist was ongoing throughout the evaluation and Dr. Newman Nickels, Ph.D. was immediately accessible at all times. Dr. Newman Nickels, Ph.D. provided supervision to the psychometrist on the date of this service to the extent necessary to assure the quality of all services provided.    Kristina Lee will return within approximately 1-2 weeks for an interactive feedback session with Dr. Milbert Coulter at which time her test performances, clinical impressions, and treatment recommendations will be reviewed in detail. [name] understands she can contact our office should she require our assistance before this time.  A total of 150 minutes of billable time were spent face-to-face with [name] by the psychometrist. This includes both test administration and scoring time. Billing for these services is reflected in the clinical report generated by Dr. Newman Nickels, Ph.D.  This note reflects time spent with the psychometrician and does not include test scores or any clinical interpretations made by Dr. Milbert Coulter. The full report will follow in a separate note.

## 2021-10-05 NOTE — Progress Notes (Signed)
NEUROPSYCHOLOGICAL EVALUATION [place]. [facility] Department of Neurology  Date of Evaluation: October 05, 2021  Reason for Referral:   Kristina Lee is a 76 y.o. right-handed Caucasian female referred by  Danise Edge, M.D. , to characterize her current cognitive functioning and assist with diagnostic clarity and treatment planning in the context of subjective cognitive decline.   Assessment and Plan:   Clinical Impression(s): Kristina Lee's pattern of performance is suggestive of diffuse cognitive impairment often in severe ranges relative to age-matched peers. Performances were adequate across basic attention, phonemic fluency, and some visuoconstructional tasks (i.e., figure copy, clock drawing). However, severe impairment was exhibited across all other assessed domains. This includes processing speed, complex attention, executive functioning, safety/judgment, receptive language, semantic fluency, confrontation naming, and all aspects of learning and memory. During interview, Kristina Lee denied difficulties completing instrumental activities of daily living (ADLs) independently. However, given that she also generally denied any significant cognitive concerns during interview, I do not believe that she has full insight into the extent of her current limitations. Current test scores would suggest impairment levels consistent with individuals diagnosed with a Major Neurocognitive Disorder ("dementia"). If her reporting of fully intact functioning can be confirmed by others (no collateral sources of information were available), then the argument could be made that she would be at the very severe end of the mild neurocognitive impairment spectrum. However, as stated above, current test scores better align with a dementia designation.   Given diffuse impairment, the etiology of ongoing impairment is difficult to discern. Despite this, consideration needs to be given to an  underlying Alzheimer's disease presentation. Across memory measures, Kristina Lee did not benefit from repeated exposure to information, exhibited minimal ability to recall previously learned information after a brief delay, and performed poorly across yes/no recognition trials. Taken together, this suggests evidence for rapid forgetting and an information storage deficit, which are the hallmark memory characteristics of this illness. Further impairments surrounding confrontation naming and semantic fluency also fall in line with typical and expected disease trajectory, further strengthening concerns. Despite Kristina Lee's stated beliefs during interview, current memory impairments unfortunately cannot be attributed to simple age-related changes. Despite reported stressors, she denied acute symptoms of anxiety and depression across related questionnaires.   Cognitive and behavioral characteristics are not consistent with Lewy body disease, frontotemporal lobar degeneration, Parkinson's disease, or another more rare parkinsonian syndrome. Neuroimaging in 2019 did not suggest a strong vascular contribution. However, no more recent imaging was available for review and there remains the potential for some anatomical event to have occurred in the interim which could have influenced memory functioning. While this is admittedly a less likely explanation, it remains plausible pending updated scans. Continued medical monitoring will be important moving forward.   Recommendations: Kristina Lee does not appear to be followed by a neurologist. As such, I will make a referral for her. When meeting with this individual, she would be encouraged to discuss medication aimed to address memory loss and concerns surrounding Alzheimer's disease. It is important to highlight that these medications have been shown to slow functional decline in some individuals. There is no current treatment which can stop or reverse cognitive decline when  caused by a neurodegenerative illness.   I would also recommend that Kristina Lee be referred for updated neuroimaging in the form of a brain MRI to better understand possible anatomical correlates for ongoing impairment.   Performance across neurocognitive testing is admittedly not a strong predictor of an  individual's safety operating a motor vehicle. However, given the degree of ongoing impairment, it is my recommendation that Kristina Lee abstain from driving pursuits pending a formal driving evaluation to better ensure both her safety, as well as the safety as others who share the road with her. Should her family wish to pursue a formalized driving evaluation, they could reach out to the following agencies: [employer] in [place]: [phone] Driver Rehabilitative Services: [facility]: [place]: [phone] or [phone]  Should there be further progression of current deficits over time, Kristina Lee is unlikely to regain any independent living skills lost. Therefore, it is recommended that she remain as involved as possible in all aspects of household chores, finances, and medication management, with supervision to ensure adequate performance. She will likely benefit from the establishment and maintenance of a routine in order to maximize her functional abilities over time.  It will be important for Kristina Lee to have another person with her when in situations where she may need to process information, weigh the pros and cons of different options, and make decisions, in order to ensure that she fully understands and recalls all information to be considered.  Kristina Lee is encouraged to attend to lifestyle factors for brain health (e.g., regular physical exercise, good nutrition habits, regular participation in cognitively-stimulating activities, and general stress management techniques), which are likely to have benefits for both  emotional adjustment and cognition. Optimal control of vascular risk factors (including safe cardiovascular exercise and adherence to dietary recommendations) is encouraged. Continued participation in activities which provide mental stimulation and social interaction is also recommended.   Important information should be provided to Kristina Lee in written format in all instances. This information should be placed in a highly frequented and easily visible location within her home to promote recall. External strategies such as written notes in a consistently used memory journal, visual and nonverbal auditory cues such as a calendar on the refrigerator or appointments with alarm, such as on a cell phone, can also help maximize recall.  To address problems with processing speed, she may wish to consider:   -Ensuring that she is alerted when essential material or instructions are being presented   -Adjusting the speed at which new information is presented    -Allowing for more time in comprehending, processing, and responding in conversation  To address problems with fluctuating attention and executive dysfunction, she may wish to consider:   -Avoiding external distractions when needing to concentrate   -Limiting exposure to fast paced environments with multiple sensory demands   -Writing down complicated information and using checklists   -Attempting and completing one task at a time (i.e., no multi-tasking)   -Verbalizing aloud each step of a task to maintain focus   -Taking frequent breaks during the completion of steps/tasks to avoid fatigue   -Reducing the amount of information considered at one time  Review of Records:   Kristina Lee was seen by her PCP on 03/14/2021 for follow-up of various medical comorbidities. During this appointment, she described increased anxiety and worsening short-term memory concerns. Increasing word finding difficulties were also reported. She described numerous  stressors occurring during the prior few years, including son getting divorced and he and his children moving in with them, as well as her dog passing away. It was unclear if cognitive decline was related to mood concerns and various stressors or if there could be an underlying neurological condition. Ultimately, Kristina Lee was referred for  a comprehensive neuropsychological evaluation to characterize her cognitive abilities and to assist with diagnostic clarity and treatment planning.   Per medical records, Dr. Charlett Blake received a telephone call from Ms. Keagy's husband on 09/19/2021, who reported that Ms. Chisman has been exhibiting progressive cognitive decline and having greater difficulties with receptive and expressive language.   [facility] MRI on 02/20/2018 revealed mild for age small vessel ischemic changes, largely focused in the right basal ganglia. Head CT on 03/12/2018 in the context of a fall with head impact was negative for any acute intracranial abnormalities. Microvascular ischemic changes were also said to be age appropriate. No more recent neuroimaging was available for my review.   Past Medical History:  Diagnosis Date   Adjustment disorder with depressed mood 03/14/2021   Allergic rhinitis 07/14/2014   Allergic urticaria 12/23/2017   CAD (coronary artery disease) 09/25/2017   Last Assessment & Plan:  History of CAD status post non-STEMI 09/02/2017 at [facility] in [place].  She underwent proximal LAD stenting via a radial approach.  EF was 45 to 50% she is on dual and type of therapy with aspirin and Brilinta.  We will recheck a 2D echo for LV fu   Chronic kidney disease, stage III (moderate) 05/02/2015   DDD (degenerative disc disease), lumbosacral 09/13/2015   Noted during 09/08/15 ed visit (L4-L5 and L5-S1) Noted during 09/08/15 ed visit (L4-L5 and L5-S1)  Formatting of this note might be different from the original. Overview:  Noted during 09/08/15 ed visit (L4-L5 and L5-S1)  Formatting of this note might be different from the original. Noted during 09/08/15 ed visit (L4-L5 and L5-S1)   Diverticulosis of large intestine 09/13/2015   Incidentally found, extensive, entire colon found on ED visit on CT scan 09/08/15 Incidentally found, extensive, entire colon found on ED visit on CT scan 09/08/15  Formatting of this note might be different from the original. Overview:  Incidentally found, extensive, entire colon found on ED visit on CT scan 09/08/15 Formatting of this note might be different from the original. Incidentally found, e   Essential hypertension 07/14/2014   Gastroesophageal reflux disease with esophagitis 09/04/2017   Hearing loss 08/10/2018   History of oral lesions 10/09/2019   History of uterine cancer 07/14/2014   s/p complete hysterectomy and B oophorectomy at [facility] no radiation. s/p complete hysterectomy and B oophorectomy at [facility] no radiation.  Formatting of this note might be different from the original. Overview:  s/p complete hysterectomy and B oophorectomy at [facility] no radiation. Formatting of this note might be different from the original. s/p complete hysterectomy and B oophorectomy at [facility] no radiati   Hyperglycemia 09/29/2020   Hyperlipidemia associated with type 2 diabetes mellitus 12/31/2016   Hypokalemia 10/28/2015   Intrinsic atopic dermatitis 12/23/2017   Low back pain 09/25/2017   Myalgia 02/09/2018   Myocardial infarction 12/23/2017   Nuclear sclerotic cataract of left eye 11/09/2020   Otitis externa 08/10/2018   Seborrheic dermatitis of scalp 09/13/2015   Suspected   Tinnitus of left ear 09/29/2020   Type II diabetes mellitus 12/23/2017   Urticaria    Varicose veins of right lower extremity with complications 123XX123    Past Surgical History:  Procedure Laterality Date   ABDOMINAL HYSTERECTOMY  2006   b/l SPO and TAH, uterine cancer   EYE SURGERY Right 1960   TUBAL LIGATION      Current Outpatient Medications:    Accu-Chek  Softclix Lancets lancets, Use as instructed, Disp: 100 each, Rfl:  12   Acetaminophen (TYLENOL PO), Take by mouth as needed., Disp: , Rfl:    amLODipine (NORVASC) 5 MG tablet, Take 1 tablet (5 mg total) by mouth daily., Disp: 30 tablet, Rfl: 3   aspirin EC 81 MG tablet, Take 81 mg by mouth daily., Disp: , Rfl:    atorvastatin (LIPITOR) 80 MG tablet, TAKE 1 TABLET BY MOUTH EVERY DAY, Disp: 90 tablet, Rfl: 1   augmented betamethasone dipropionate (DIPROLENE-AF) 0.05 % cream, Apply topically daily., Disp: , Rfl:    Calcium Carbonate-Vit D-Min (CALCIUM 600+D PLUS MINERALS) 600-400 MG-UNIT TABS, Take 2 tablets by mouth daily. , Disp: , Rfl:    clopidogrel (PLAVIX) 75 MG tablet, TAKE 1 TABLET BY MOUTH EVERY DAY, Disp: 90 tablet, Rfl: 3   diclofenac Sodium (VOLTAREN) 1 % GEL, Apply 4 g topically 4 (four) times daily as needed., Disp: 300 g, Rfl: 2   fluticasone (FLONASE) 50 MCG/ACT nasal spray, Place 2 sprays into both nostrils daily., Disp: 48 g, Rfl: 1   lisinopril (ZESTRIL) 20 MG tablet, Take 1 tablet (20 mg total) by mouth daily., Disp: 90 tablet, Rfl: 1   montelukast (SINGULAIR) 10 MG tablet, TAKE 1 TABLET BY MOUTH EVERYDAY AT BEDTIME, Disp: 90 tablet, Rfl: 1   OVER THE COUNTER MEDICATION, as needed., Disp: , Rfl:    polyethylene glycol (MIRALAX / GLYCOLAX) 17 g packet, Take 17 g by mouth as needed., Disp: , Rfl:    sucralfate (CARAFATE) 1 GM/10ML suspension, Take 10 mLs (1 g total) by mouth 4 (four) times daily -  with meals and at bedtime., Disp: 420 mL, Rfl: 0  Clinical Interview:   The following information was obtained during a clinical interview with Kristina Lee prior to cognitive testing.  Cognitive Symptoms: Decreased short-term memory: Endorsed. However, she generally downplayed observed concerns. She noted that she has trouble with word finding and recalling names of individuals. She also reported misplacing or losing things in her environment. However, she generally attributed this to  age-related changes and appeared to view the necessity and rationale for current testing in a skeptical manner.   Decreased long-term memory: Denied. Decreased attention/concentration: Denied.  Reduced processing speed: Endorsed "to some extent."  Difficulties with executive functions: Denied. She also denied trouble with impulsivity or any significant personality changes.  Difficulties with emotion regulation: Denied. Difficulties with receptive language: Denied. Difficulties with word finding: Endorsed. Decreased visuoperceptual ability: Denied.  Trajectory of deficits: When asked directly, she did acknowledge the potential for cognitive decline to have occurred during the past 1-2 years. However, she attributed this to age-related changes rather than concern for an underlying neurological condition.   Difficulties completing ADLs: Denied.  Additional Medical History: History of traumatic brain injury/concussion: Denied. History of stroke: Denied. History of seizure activity: Denied. History of known exposure to toxins: Denied. Symptoms of chronic pain: Endorsed. She described pain stemming from a fall she had "around Trump's inauguration." When describing this event, she described pain in her elbow but physically referenced her shoulder. Later, she described shoulder pain and did not reference her elbow. The ultimate source of pain was therefore unknown. It was not described as debilitating presently. Medical records do suggest a history of a prior fall; however, this was in November 2019.  Experience of frequent headaches/migraines: Denied. Frequent instances of dizziness/vertigo: Denied.  Sensory changes: She wears glasses and utilizes hearing aids with benefit. Other sensory changes/difficulties (e.g., taste or smell) were denied.  Balance/coordination difficulties: Denied. Other motor difficulties: Denied.  Sleep  History: Estimated hours obtained each night: 7-8 hours.   Difficulties falling asleep: Denied. Difficulties staying asleep: Denied outside of occasionally waking to use the restroom.  Feels rested and refreshed upon awakening: Endorsed.  History of snoring: Denied. History of waking up gasping for air: Denied. Witnessed breath cessation while asleep: Denied.  History of vivid dreaming: Denied. Excessive movement while asleep: Denied. Instances of acting out her dreams: Denied.  Psychiatric/Behavioral Health History: Depression: Endorsed. When asked about her current mood, she alluded to some depressive symptoms. She reported having "a tough couple years," including several of her close friends passing her, her dog passing away, her son going through a divorce and he and his children moving in, and her sister's health problems and dementia diagnosis. She did not report being formally diagnosed with a depressive disorder in the past. Current or remote suicidal ideation, intent, or plan was denied.  Anxiety: Denied. Mania: Denied. Trauma History: Denied. Visual/auditory hallucinations: Denied. Delusional thoughts: Denied.  Tobacco: Denied. Alcohol: She denied current alcohol consumption as well as a history of problematic alcohol abuse or dependence.  Recreational drugs: Denied.  Family History: Problem Relation Age of Onset   Cancer Mother        liver cancer, breast cancer x 2    Alcohol abuse Mother        smoker   Heart disease Father        MI   Alcohol abuse Father        smoker   Depression Sister    Tremor Sister    Dementia Sister    Cancer Brother        pancreatic   Diabetes Maternal Grandmother    Hearing loss Maternal Grandmother    Depression Maternal Grandmother    Healthy Son    Allergic rhinitis Neg Hx    Angioedema Neg Hx    Asthma Neg Hx    Eczema Neg Hx    Immunodeficiency Neg Hx    Urticaria Neg Hx    This information was confirmed by Ms. Bostick.  Academic/Vocational History: Highest level of  educational attainment: 18 years. 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 Math was noted as a likely relative weakness.  History of developmental delay: Denied. History of grade repetition: Denied. Enrollment in special education courses: Denied. History of LD/ADHD: Denied.  Employment: Retired. She previously worked as a Pharmacist, hospital.   Evaluation Results:   Behavioral Observations: Kristina Lee was accompanied in the waiting room; however, she was alone during the current interview. She arrived to her appointment on time and was appropriately dressed and groomed. She appeared alert. Observed gait and station were within normal limits. Gross motor functioning appeared intact upon informal observation and no abnormal movements (e.g., tremors) were noted. Her affect was generally relaxed and positive, but did range appropriately given the subject being discussed during the clinical interview or the task at hand during testing procedures. Spontaneous speech was fluent. However, word finding difficulties were observed during interview. She also misspoke several times. At one time, she stated her age was 54 rather than 60. At another point, while trying to provide a past date when she stopped smoking, she stated "September something two thousand years ago." Thought processes were otherwise coherent, organized, and normal in content. Insight into her cognitive difficulties appeared very poor given diffuse objective impairment across testing and I do not believe  that she has adequate insight into the extent of ongoing limitations. She also did not appear to appreciate the extent of poor performances while in the midst of testing.   During testing, word finding difficulties were prevalent. She was noted to near continuously need redirection and frequently forgot task instructions immediately after  they were provided or while attempting to complete the task at hand. There were times where her engagement appeared to wane and she required encouragement from the psychometrist to persist. Sustained attention was generally adequate. She exhibited confusion when filling out mood-related questionnaires, requiring them to be read aloud to her. Overall, Kristina Lee was generally cooperative with the clinical interview and subsequent testing procedures.   Adequacy of Effort: The validity of neuropsychological testing is limited by the extent to which the individual being tested may be assumed to have exerted adequate effort during testing. Kristina Lee expressed her intention to perform to the best of her abilities and exhibited adequate task engagement and persistence. Scores across stand-alone and embedded performance validity measures were within expectation. As such, the results of the current evaluation are believed to be a valid representation of Ms. Genao's current cognitive functioning.  Test Results: Ms. Langell was mildly disoriented at the time of the current evaluation. She was unable to state [place] She was also unable to state the current date, name of the clinic, or identify the city which the clinic is located in. When asked for the reason for her current evaluation, she responded "My husband thought I don't remember enough."  Intellectual abilities based upon educational and vocational attainment were estimated to be in the average to above average range. Premorbid abilities were estimated to be within the well above average range based upon a single-word reading test.   Processing speed was mildly variable but largely fell in the exceptionally low normative range. Basic attention was below average to average. More complex attention (e.g., working memory) was well below average to below average. Executive functioning was  exceptionally low. Her performance across a task assessing safety and judgment was also exceptionally low. Responses across the latter task were noteworthy for noted confusion and inaccurate/unsafe responses.  Assessed receptive language abilities were exceptionally low. Primary areas of impairment across this task surrounded trouble understanding complex sentence structure, understanding conceptual information (e.g., point to the red triangle), and following multi-step commands. Assessed expressive language was mildly variable. Phonemic fluency was below average to average, semantic fluency was exceptionally low to well below average, and confrontation naming was exceptionally low to well below average. Writing samples were appropriate.   Assessed visuospatial/visuoconstructional abilities were variable, ranging from the exceptionally low to average normative ranges. Points were lost on her drawing of a clock due to mild spatial abnormalities in numerical placement and incorrect hand placement. Her copy of a complex figure was appropriate.    Learning (i.e., encoding) of novel verbal information was exceptionally low to well below average. Spontaneous delayed recall (i.e., retrieval) of previously learned information was exceptionally low to well below average. Retention rates were 50% (raw score of 2) across a story learning task, 33% (raw score of 1) across a list learning task, and 6% across a figure drawing task. Performance across a list recognition task was exceptionally low, suggesting negligible evidence for information consolidation.   Results of emotional screening instruments suggested that recent symptoms of generalized anxiety were in the minimal range, while symptoms of  depression were within normal limits. A screening instrument assessing recent sleep quality suggested the presence of minimal sleep dysfunction.  Tables of Scores:   Note: This summary of test scores accompanies the  interpretive report and should not be considered in isolation without reference to the appropriate sections in the text. Descriptors are based on appropriate normative data and may be adjusted based on clinical judgment. Terms such as "Within Normal Limits" and "Outside Normal Limits" are used when a more specific description of the test score cannot be determined.       Percentile - Normative Descriptor > 98 - Exceptionally High 91-97 - Well Above Average 75-90 - Above Average 25-74 - Average 9-24 - Below Average 2-8 - Well Below Average < 2 - Exceptionally Low       Validity:   DESCRIPTOR       Dot Counting Test: --- --- Within Normal Limits  RBANS Effort Index: --- --- Within Normal Limits  WAIS-IV Reliable Digit Span: --- --- Within Normal Limits       Orientation:      Raw Score Percentile   NAB Orientation, Form 1 22/29 --- ---       Cognitive Screening:      Raw Score Percentile   SLUMS: 9/30 --- ---       RBANS, Form A: Standard Score/ Scaled Score Percentile   Total Score 52 <1 Exceptionally Low  Immediate Memory 57 <1 Exceptionally Low    List Learning 2 <1 Exceptionally Low    Story Memory 4 2 Well Below Average  Visuospatial/Constructional 69 2 Well Below Average    Figure Copy 9 37 Average    Line Orientation 0/20 <2 Exceptionally Low  Language 68 2 Well Below Average    Picture Naming 8/10 3-9 Well Below Average    Semantic Fluency 4 2 Well Below Average  Attention 60 <1 Exceptionally Low    Digit Span 7 16 Below Average    Coding 1 <1 Exceptionally Low  Delayed Memory 44 <1 Exceptionally Low    List Recall 1/10 3-9 Well Below Average    List Recognition 13/20 <2 Exceptionally Low    Story Recall 3 1 Exceptionally Low    Figure Recall 2 <1 Exceptionally Low        Intellectual Functioning:      Standard Score Percentile   Test of Premorbid Functioning: 122 93 Well Above Average       Attention/Executive Function:     Trail Making Test (TMT): Raw  Score (T Score) Percentile     Part A 97 secs.,  0 errors (17) <1 Exceptionally Low    Part B Discontinued --- Impaired         Scaled Score Percentile   WAIS-IV Digit Span: 5 5 Well Below Average    Forward 8 25 Average    Backward 6 9 Below Average    Sequencing 4 2 Well Below Average        Scaled Score Percentile   WAIS-IV Similarities: 4 2 Well Below Average       D-KEFS Color-Word Interference Test: Raw Score (Scaled Score) Percentile     Color Naming 62 secs. (1) <1 Exceptionally Low    Word Reading 34 secs. (6) 9 Below Average    Inhibition Discontinued --- Impaired    Inhibition/Switching Discontinued --- Impaired       D-KEFS Verbal Fluency Test: Raw Score (Scaled Score) Percentile     Letter Total Correct 30 (9)  37 Average    Category Total Correct 19 (4) 2 Well Below Average    Category Switching Total Correct 4 (1) <1 Exceptionally Low    Category Switching Accuracy 2 (1) <1 Exceptionally Low      Total Set Loss Errors 4 (8) 25 Average      Total Repetition Errors 5 (8) 25 Average       NAB Executive Functions Module, Form 1: T Score Percentile     Judgment 19 <1 Exceptionally Low       Language:      Raw Score Percentile   Sentence Repetition: 10/22 <1 Exceptionally Low       Verbal Fluency Test: Raw Score (T Score) Percentile     Phonemic Fluency (FAS) 30 (38) 12 Below Average    Animal Fluency 9 (23) <1 Exceptionally Low        NAB Language Module, Form 1: T Score Percentile     Auditory Comprehension 19 <1 Exceptionally Low    Naming 23/31 (23) <1 Exceptionally Low       Visuospatial/Visuoconstruction:      Raw Score Percentile   Clock Drawing: 7/10 --- Within Normal Limits        Scaled Score Percentile   WAIS-IV Block Design: 3 1 Exceptionally Low       Mood and Personality:      Raw Score Percentile   Geriatric Depression Scale: 7 --- Within Normal Limits  Geriatric Anxiety Scale: 6 --- Minimal    Somatic 4 --- Minimal    Cognitive 0 ---  Minimal    Affective 2 --- Minimal       Additional Questionnaires:      Raw Score Percentile   PROMIS Sleep Disturbance Questionnaire: 12 --- None to Slight   Informed Consent and Coding/Compliance:   The current evaluation represents a clinical evaluation for the purposes previously outlined by the referral source and is in no way reflective of a forensic evaluation.   Ms. Scahill was provided with a verbal description of the nature and purpose of the present neuropsychological evaluation. Also reviewed were the foreseeable risks and/or discomforts and benefits of the procedure, limits of confidentiality, and mandatory reporting requirements of this provider. The patient was given the opportunity to ask questions and receive answers about the evaluation. Oral consent to participate was provided by the patient.   This evaluation was conducted by Newman Nickels, Ph.D., ABPP-CN, board certified clinical neuropsychologist. Ms. Loscalzo completed a clinical interview with Dr. Milbert Coulter, billed as one unit [phone], and 150 minutes of cognitive testing and scoring, billed as one unit [phone] and four additional units [id number]. Psychometrist Shan Levans, B.S., assisted Dr. Milbert Coulter with test administration and scoring procedures. As a separate and discrete service, Dr. Milbert Coulter spent a total of 160 minutes in interpretation and report writing billed as one unit [phone] and two units 96133.

## 2021-10-09 ENCOUNTER — Telehealth: Payer: Self-pay

## 2021-10-09 NOTE — Progress Notes (Deleted)
Subjective:   Kristina Lee is a 76 y.o. female who presents for Medicare Annual (Subsequent) preventive examination.  I connected with Kristina Lee today by telephone and verified that I am speaking with the correct person using two identifiers. Location patient: home Location provider: work Persons participating in the virtual visit: patient, Engineer, civil (consulting).    I discussed the limitations, risks, security and privacy concerns of performing an evaluation and management service by telephone and the availability of in person appointments. I also discussed with the patient that there may be a patient responsible charge related to this service. The patient expressed understanding and verbally consented to this telephonic visit.    Interactive audio and video telecommunications were attempted between this provider and patient, however failed, due to patient having technical difficulties OR patient did not have access to video capability.  We continued and completed visit with audio only.  Some vital signs may be absent or patient reported.   Time Spent with patient on telephone encounter: *** minutes   Review of Systems    ***       Objective:    There were no vitals filed for this visit. There is no height or weight on file to calculate BMI.     09/29/2020    1:49 PM 09/25/2019    1:56 PM 08/03/2019    2:45 PM 07/22/2018    1:45 PM 04/29/2018    2:29 PM 11/18/2017   10:11 PM  Advanced Directives  Does Patient Have a Medical Advance Directive? Yes Yes Yes Yes Yes No  Type of Estate agent of [place];Living will Healthcare Power of [place];Living will  Healthcare Power of [place];Living will    Does patient want to make changes to medical advance directive?  No - Patient declined No - Patient declined No - Guardian declined No - Patient declined   Copy of Healthcare Power of Attorney in Chart? No - copy requested No - copy requested  No - copy requested    Would patient  like information on creating a medical advance directive?      No - Patient declined    Current Medications (verified) Outpatient Encounter Medications as of 10/09/2021  Medication Sig   Accu-Chek Softclix Lancets lancets Use as instructed   Acetaminophen (TYLENOL PO) Take by mouth as needed.   amLODipine (NORVASC) 5 MG tablet Take 1 tablet (5 mg total) by mouth daily.   aspirin EC 81 MG tablet Take 81 mg by mouth daily.   atorvastatin (LIPITOR) 80 MG tablet TAKE 1 TABLET BY MOUTH EVERY DAY   augmented betamethasone dipropionate (DIPROLENE-AF) 0.05 % cream Apply topically daily.   Calcium Carbonate-Vit D-Min (CALCIUM 600+D PLUS MINERALS) 600-400 MG-UNIT TABS Take 2 tablets by mouth daily.    clopidogrel (PLAVIX) 75 MG tablet TAKE 1 TABLET BY MOUTH EVERY DAY   diclofenac Sodium (VOLTAREN) 1 % GEL Apply 4 g topically 4 (four) times daily as needed.   fluticasone (FLONASE) 50 MCG/ACT nasal spray Place 2 sprays into both nostrils daily.   lisinopril (ZESTRIL) 20 MG tablet Take 1 tablet (20 mg total) by mouth daily.   montelukast (SINGULAIR) 10 MG tablet TAKE 1 TABLET BY MOUTH EVERYDAY AT BEDTIME   OVER THE COUNTER MEDICATION as needed.   polyethylene glycol (MIRALAX / GLYCOLAX) 17 g packet Take 17 g by mouth as needed.   sucralfate (CARAFATE) 1 GM/10ML suspension Take 10 mLs (1 g total) by mouth 4 (four) times daily -  with meals and  at bedtime.   No facility-administered encounter medications on file as of 10/09/2021.    Allergies (verified) Amoxicillin, Hydroxyzine, and Sulfa antibiotics   History: Past Medical History:  Diagnosis Date   Adjustment disorder with depressed mood 03/14/2021   Allergic rhinitis 07/14/2014   Allergic urticaria 12/23/2017   CAD (coronary artery disease) 09/25/2017   Last Assessment & Plan:  History of CAD status post non-STEMI 09/02/2017 at [facility] in [place].  She underwent proximal LAD stenting via a radial approach.  EF was 45 to 50% she is  on dual and type of therapy with aspirin and Brilinta.  We will recheck a 2D echo for LV fu   Chronic kidney disease, stage III (moderate) 05/02/2015   DDD (degenerative disc disease), lumbosacral 09/13/2015   Noted during 09/08/15 ed visit (L4-L5 and L5-S1) Noted during 09/08/15 ed visit (L4-L5 and L5-S1)  Formatting of this note might be different from the original. Overview:  Noted during 09/08/15 ed visit (L4-L5 and L5-S1) Formatting of this note might be different from the original. Noted during 09/08/15 ed visit (L4-L5 and L5-S1)   Diverticulosis of large intestine 09/13/2015   Incidentally found, extensive, entire colon found on ED visit on CT scan 09/08/15 Incidentally found, extensive, entire colon found on ED visit on CT scan 09/08/15  Formatting of this note might be different from the original. Overview:  Incidentally found, extensive, entire colon found on ED visit on CT scan 09/08/15 Formatting of this note might be different from the original. Incidentally found, e   Essential hypertension 07/14/2014   Gastroesophageal reflux disease with esophagitis 09/04/2017   Hearing loss 08/10/2018   History of oral lesions 10/09/2019   History of uterine cancer 07/14/2014   s/p complete hysterectomy and B oophorectomy at [facility] no radiation. s/p complete hysterectomy and B oophorectomy at [facility] no radiation.  Formatting of this note might be different from the original. Overview:  s/p complete hysterectomy and B oophorectomy at [facility] no radiation. Formatting of this note might be different from the original. s/p complete hysterectomy and B oophorectomy at [facility] no radiati   Hyperglycemia 09/29/2020   Hyperlipidemia associated with type 2 diabetes mellitus 12/31/2016   Hypokalemia 10/28/2015   Intrinsic atopic dermatitis 12/23/2017   Low back pain 09/25/2017   Mild dementia with concerns for Alzheimer's disease 10/05/2021   Myalgia 02/09/2018   Myocardial infarction 12/23/2017   Nuclear sclerotic cataract  of left eye 11/09/2020   Otitis externa 08/10/2018   Seborrheic dermatitis of scalp 09/13/2015   Suspected   Tinnitus of left ear 09/29/2020   Type II diabetes mellitus 12/23/2017   Urticaria    Varicose veins of right lower extremity with complications 05/08/2018   Past Surgical History:  Procedure Laterality Date   ABDOMINAL HYSTERECTOMY  2006   b/l SPO and TAH, uterine cancer   EYE SURGERY Right 1960   TUBAL LIGATION     Family History  Problem Relation Age of Onset   Cancer Mother        liver cancer, breast cancer x 2    Alcohol abuse Mother        smoker   Heart disease Father        MI   Alcohol abuse Father        smoker   Depression Sister    Tremor Sister    Dementia Sister    Cancer Brother        pancreatic   Diabetes Maternal Grandmother  Hearing loss Maternal Grandmother    Depression Maternal Grandmother    Healthy Son    Allergic rhinitis Neg Hx    Angioedema Neg Hx    Asthma Neg Hx    Eczema Neg Hx    Immunodeficiency Neg Hx    Urticaria Neg Hx    Social History   Socioeconomic History   Marital status: Married    Spouse name: Not on file   Number of children: Not on file   Years of education: 18   Highest education level: Master's degree (e.g., MA, MS, MEng, MEd, MSW, MBA)  Occupational History   Occupation: Retired    Comment: Runner, broadcasting/film/video  Tobacco Use   Smoking status: Former    Packs/day: 0.50    Years: 24.00    Total pack years: 12.00    Types: Cigarettes    Quit date: 04/17/1987    Years since quitting: 34.5   Smokeless tobacco: Never  Vaping Use   Vaping Use: Never used  Substance and Sexual Activity   Alcohol use: Yes    Comment: occ   Drug use: Not Currently   Sexual activity: Yes  Other Topics Concern   Not on file  Social History Narrative   Not on file   Social Determinants of Health   Financial Resource Strain: Low Risk  (09/29/2020)   Overall Financial Resource Strain (CARDIA)    Difficulty of Paying Living  Expenses: Not hard at all  Food Insecurity: No Food Insecurity (09/29/2020)   Hunger Vital Sign    Worried About Running Out of Food in the Last Year: Never true    Ran Out of Food in the Last Year: Never true  Transportation Needs: No Transportation Needs (09/29/2020)   PRAPARE - Administrator, Civil Service (Medical): No    Lack of Transportation (Non-Medical): No  Physical Activity: Inactive (09/29/2020)   Exercise Vital Sign    Days of Exercise per Week: 0 days    Minutes of Exercise per Session: 0 min  Stress: Stress Concern Present (09/29/2020)   [employer] of Occupational Health - Occupational Stress Questionnaire    Feeling of Stress : To some extent  Social Connections: Moderately Isolated (09/29/2020)   Social Connection and Isolation Panel [NHANES]    Frequency of Communication with Friends and Family: More than three times a week    Frequency of Social Gatherings with Friends and Family: Once a week    Attends Religious Services: Never    Database administrator or Organizations: No    Attends Banker Meetings: Never    Marital Status: Married    Tobacco Counseling Counseling given: Not Answered   Clinical Intake:              How often do you need to have someone help you when you read instructions, pamphlets, or other written materials from your doctor or pharmacy?: (P) 2 - Rarely  Diabetes:  Is the patient diabetic?  Yes  If diabetic, was a CBG obtained today?  No  Did the patient bring in their glucometer from home?  No phone visit How often do you monitor your CBG's? ***.   Financial Strains and Diabetes Management:  Are you having any financial strains with the device, your supplies or your medication? {YES/NO:21197}.  Does the patient want to be seen by Chronic Care Management for management of their diabetes?  {YES/NO:21197} Would the patient like to be referred to a Nutritionist or for  Diabetic Management?   {YES/NO:21197}  Diabetic Exams:  Diabetic Eye Exam: Completed 09/21/2021.   Diabetic Foot Exam: Pt has been advised about the importance in completing this exam. To be completed by PCP.           Activities of Daily Living    10/08/2021    9:26 PM  In your present state of health, do you have any difficulty performing the following activities:  Hearing? 0  Vision? 0  Difficulty concentrating or making decisions? 1  Walking or climbing stairs? 0  Dressing or bathing? 0  Doing errands, shopping? 0  Preparing Food and eating ? N  Using the Toilet? N  In the past six months, have you accidently leaked urine? N  Do you have problems with loss of bowel control? N  Managing your Medications? N  Managing your Finances? N  Housekeeping or managing your Housekeeping? N    Patient Care Team: Bradd Canary, MD as PCP - General (Family Medicine) Runell Gess, MD as PCP - Cardiology (Cardiology) Reginia Naas, MD as Referring Physician (Dermatology)  Indicate any recent Medical Services you may have received from other than [facility] providers in the past year (date may be approximate).     Assessment:   This is a routine wellness examination for Kristina Lee.  Hearing/Vision screen No results found.  Dietary issues and exercise activities discussed:     Goals Addressed   None    Depression Screen    09/29/2020    1:55 PM 09/25/2019    2:03 PM 07/22/2018    1:46 PM  PHQ 2/9 Scores  PHQ - 2 Score 3 0 0  PHQ- 9 Score 5      Fall Risk    10/08/2021    9:26 PM 09/29/2020    1:52 PM 09/25/2019    2:04 PM 09/25/2019    2:03 PM 07/22/2018    1:46 PM  Fall Risk   Falls in the past year? 0 1  1 0  Comment   tripped over dog.    Number falls in past yr: 0 1  0   Injury with Fall? 0 1  1   Risk for fall due to :  History of fall(s)     Follow up  Falls prevention discussed  Education provided;Falls prevention discussed     FALL RISK PREVENTION PERTAINING TO THE HOME:  Any  stairs in or around the home? {YES/NO:21197} If so, are there any without handrails? {YES/NO:21197} Home free of loose throw rugs in walkways, pet beds, electrical cords, etc? {YES/NO:21197} Adequate lighting in your home to reduce risk of falls? {YES/NO:21197}  ASSISTIVE DEVICES UTILIZED TO PREVENT FALLS:  Life alert? {YES/NO:21197} Use of a cane, walker or w/c? {YES/NO:21197} Grab bars in the bathroom? {YES/NO:21197} Shower chair or bench in shower? {YES/NO:21197} Elevated toilet seat or a handicapped toilet? {YES/NO:21197}  TIMED UP AND GO:  Was the test performed? No . Phone  visit   Cognitive Function:Patient has current diagnosis of cognitive impairment. Patient is followed by neurology for ongoing assessment.      08/01/2021    3:45 PM  MMSE - Mini Mental State Exam  Orientation to time 3  Orientation to Place 2  Registration 3  Attention/ Calculation 5  Recall 2  Language- name 2 objects 2  Language- repeat 1  Language- follow 3 step command 3  Language- read & follow direction 1  Write a sentence 1  Copy design 0  Total  score 23        09/29/2020    2:15 PM  6CIT Screen  What Year? 0 points  What month? 0 points  What time? 3 points  Count back from 20 0 points  Months in reverse 0 points  Repeat phrase 2 points  Total Score 5 points    Immunizations Immunization History  Administered Date(s) Administered   Fluad Quad(high Dose 65+) 12/23/2018, 01/22/2020   Influenza, High Dose Seasonal PF 01/18/2015, 02/02/2016, 01/17/2017, 12/31/2017, 01/09/2021   Influenza,inj,Quad PF,6+ Mos 02/14/2014   Influenza-Unspecified 12/31/2017   PFIZER(Purple Top)SARS-COV-2 Vaccination 06/18/2019, 07/14/2019, 03/09/2020   Pneumococcal Conjugate-13 05/27/2013, 05/27/2013   Pneumococcal Polysaccharide-23 04/16/2014, 04/16/2014   Tdap 07/14/2014, 07/14/2014   Zoster, Live 04/17/2011, 04/17/2011    TDAP status: Up to date  Flu Vaccine status: Up to  date  Pneumococcal vaccine status: Up to date  {Covid-19 vaccine status:2101808}  Qualifies for Shingles Vaccine? Yes   Zostavax completed Yes   Shingrix Completed?: No.    Education has been provided regarding the importance of this vaccine. Patient has been advised to call insurance company to determine out of pocket expense if they have not yet received this vaccine. Advised may also receive vaccine at local pharmacy or Health Dept. Verbalized acceptance and understanding.  Screening Tests Health Maintenance  Topic Date Due   FOOT EXAM  Never done   Zoster Vaccines- Shingrix (1 of 2) Never done   COVID-19 Vaccine (4 - Pfizer series) 05/04/2020   HEMOGLOBIN A1C  09/11/2021   INFLUENZA VACCINE  11/14/2021   OPHTHALMOLOGY EXAM  09/22/2022   TETANUS/TDAP  07/13/2024   Pneumonia Vaccine 65+ Years old  Completed   DEXA SCAN  Completed   Hepatitis C Screening  Completed   HPV VACCINES  Aged Out   COLONOSCOPY (Pts 45-67yrs Insurance coverage will need to be confirmed)  Discontinued    Health Maintenance  Health Maintenance Due  Topic Date Due   FOOT EXAM  Never done   Zoster Vaccines- Shingrix (1 of 2) Never done   COVID-19 Vaccine (4 - Pfizer series) 05/04/2020   HEMOGLOBIN A1C  09/11/2021    Colorectal cancer screening: No longer required.   Mammogram status: Completed bilateral 11/14/2020. Repeat every year  Bone Density status: Completed 11/14/2020. Results reflect: Bone density results: OSTEOPENIA. Repeat every 2 years.  Lung Cancer Screening: (Low Dose CT Chest recommended if Age 81-80 years, 30 pack-year currently smoking OR have quit w/in 15years.) does not qualify.     Additional Screening:  Hepatitis C Screening: Completed 09/29/2020  Vision Screening: Recommended annual ophthalmology exams for early detection of glaucoma and other disorders of the eye. Is the patient up to date with their annual eye exam?  Yes  Who is the provider or what is the name of the  office in which the patient attends annual eye exams? ***   Dental Screening: Recommended annual dental exams for proper oral hygiene  Community Resource Referral / Chronic Care Management: CRR required this visit?  {YES/NO:21197}  CCM required this visit?  {YES/NO:21197}     Plan:     I have personally reviewed and noted the following in the patient's chart:   Medical and social history Use of alcohol, tobacco or illicit drugs  Current medications and supplements including opioid prescriptions.  Functional ability and status Nutritional status Physical activity Advanced directives List of other physicians Hospitalizations, surgeries, and ER visits in previous 12 months Vitals Screenings to include cognitive, depression, and falls Referrals  and appointments  In addition, I have reviewed and discussed with patient certain preventive protocols, quality metrics, and best practice recommendations. A written personalized care plan for preventive services as well as general preventive health recommendations were provided to patient.   Due to this being a telephonic visit, the after visit summary with patients personalized plan was offered to patient via mail or my-chart. Patient would like to access on my-chart.   Kristina Raider, LPN   6/86/1683  Nurse health Advisor  Nurse Notes: ***

## 2021-10-12 ENCOUNTER — Ambulatory Visit: Payer: Medicare PPO | Admitting: Psychology

## 2021-10-12 ENCOUNTER — Encounter: Payer: Self-pay | Admitting: Psychology

## 2021-10-12 ENCOUNTER — Telehealth: Payer: Self-pay | Admitting: *Deleted

## 2021-10-12 DIAGNOSIS — G3184 Mild cognitive impairment, so stated: Secondary | ICD-10-CM

## 2021-10-12 DIAGNOSIS — F4321 Adjustment disorder with depressed mood: Secondary | ICD-10-CM

## 2021-10-12 NOTE — Telephone Encounter (Signed)
Left message to call back.  We do not have an appointment today here.  She does need to schedule a follow up though.

## 2021-10-12 NOTE — Telephone Encounter (Signed)
Who Is Calling Patient / Member / Family / Caregiver Caller Name Tyrah Broers Caller Phone Number [phone] Patient Name Kristina Lee Patient DOB September 22, 1945 Call Type Message Only Information Provided Reason for Call Request for General Office Information Initial Comment Caller states she would like to confirm her appt. Disp. Time Disposition Final User 10/11/2021 5:07:33 PM General Information Provided Yes Dennison Mascot

## 2021-10-12 NOTE — Progress Notes (Signed)
   Neuropsychology Feedback Session Kristina Lee. [facility] [place] Department of Neurology  Reason for Referral:   Kristina Lee is a 76 y.o. right-handed Caucasian female referred by  Danise Edge, M.D. , to characterize her current cognitive functioning and assist with diagnostic clarity and treatment planning in the context of subjective cognitive decline.   Feedback:   Kristina Lee completed a comprehensive neuropsychological evaluation on 10/05/2021. Please refer to that encounter for the full report and recommendations. Briefly, results suggested diffuse cognitive impairment often in severe ranges relative to age-matched peers. Performances were adequate across basic attention, phonemic fluency, and some visuoconstructional tasks (i.e., figure copy, clock drawing). However, severe impairment was exhibited across all other assessed domains. This includes processing speed, complex attention, executive functioning, safety/judgment, receptive language, semantic fluency, confrontation naming, and all aspects of learning and memory. Given diffuse impairment, the etiology of ongoing impairment is difficult to discern. Despite this, consideration needs to be given to an underlying Alzheimer's disease presentation. Across memory measures, Kristina Lee did not benefit from repeated exposure to information, exhibited minimal ability to recall previously learned information after a brief delay, and performed poorly across yes/no recognition trials. Taken together, this suggests evidence for rapid forgetting and an information storage deficit, which are the hallmark memory characteristics of this illness. Further impairments surrounding expressive language (i.e., confrontation naming and semantic fluency) also fall in line with typical and expected disease trajectory, further strengthening concerns. Despite Kristina Lee's stated beliefs during interview, current memory impairments unfortunately cannot be  attributed to simple age-related changes. Despite reported stressors, she denied acute symptoms of anxiety and depression across related questionnaires.   Kristina Lee was accompanied by her husband during the current feedback session. Content of the current session focused on the results of her neuropsychological evaluation. Kristina Lee was given the opportunity to ask questions and her questions were answered. She was encouraged to reach out should additional questions arise. A copy of her report was provided at the conclusion of the visit.      45 minutes were spent conducting the current feedback session with Kristina Lee, billed as one unit [id number].

## 2021-10-28 ENCOUNTER — Other Ambulatory Visit: Payer: Self-pay | Admitting: Family

## 2021-11-01 ENCOUNTER — Ambulatory Visit: Payer: Medicare PPO | Admitting: Physician Assistant

## 2021-11-01 ENCOUNTER — Encounter: Payer: Self-pay | Admitting: Physician Assistant

## 2021-11-01 DIAGNOSIS — Z029 Encounter for administrative examinations, unspecified: Secondary | ICD-10-CM

## 2021-11-15 NOTE — Progress Notes (Signed)
Assessment/Plan:   The patient is seen in neurologic consultation at the request of Kristina Canary, MD for the evaluation of memory.  Kristina Lee is a very pleasant 76 y.o. year old RH female with a history of hypertension, hyperlipidemia, CAD, CKD stage III, diverticulosis, hearing loss, DM2 diet controlled, seen today for evaluation of memory loss.  Neuropsych evaluation on 10/05/2021 noted diffuse impairment, with etiology difficult to discern, but consideration needs to be given to underlying Alzheimer's disease presentation.  She is not on antidementia medication.  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 Side effects discussed   Continue to control cardiovascular risk factors.  Continue Plavix. Folllow up in 6  months  Subjective:     The patient is accompanied by her husband who supplements the history.    How long did patient have memory difficulties?  Possibly about 18 months ago, when she was having difficulty finding the right word.  Short-term memory is worse than long-term memory although they are both affected.  Sometimes she does not recognize her husband when looking at the family picture.  Here she has trouble understanding your new normal .  She continues to read, and occasionally she plays Scrabble.   Patient lives with: Spouse  repeats oneself?  Endorsed but not often. Disoriented when walking into a room?  "I did not recognize people at a party who decorated the house like mine "(this was her actual house).  She also stated at the time, " a man came whom she asked "who are you?  "(This was actually her husband whom she did not recognize).  "She actually had to go to the attic and look at the pictures including  her wedding pictures to see food that mom was, this went away after 2 days " Leaving objects in unusual places?  She does misplace or losing things and her place, but attributes this to age. Ambulates  with difficulty?   Patient denies   Recent falls?  Patient denies   Any head injuries?  Patient denies   History of seizures?   Patient denies   Wandering behavior?  Patient denies   Patient drives?. "I am a very safe driver". I drive only during the day.  Her husband agrees Any mood changes? Endorsed. Having trouble accepting this new normal.  Also, she has some depression because in the brief period of time, several members of her family and friend died, her son is going through a tough divorce, and she has 1 sister with dementia.  Prior to that, she did not carry a diagnosis of depression. Hallucinations?  Sometimes she sees 3 people in her room who "talk to her ".  Sometimes she hears someone banging in the bedroom door. Paranoia?  Patient denies   Patient reports that she sleeps well without vivid dreams, REM behavior or sleepwalking.  She sleeps about 7 hours a night. History of sleep apnea?  Patient denies   Any hygiene concerns?  Patient denies   Independent of bathing and dressing?  Endorsed  Does the patient needs help with medications? Patient in charge  Who is in charge of the finances?  Patient is in charge with husband Any changes in appetite?  Patient denies   Patient have trouble swallowing? Patient denies   Does the patient cook?  Patient denies   Any kitchen accidents such as leaving the stove on? Patient denies   Any headaches?  Patient denies   The double vision? Patient denies   Any focal numbness or tingling?  Patient denies   Chronic back pain Patient denies   Unilateral weakness?  Patient denies   Any tremors?  Patient denies   Any history of anosmia?  Patient denies   Any incontinence of urine?  Patient denies   Any bowel dysfunction?   Patient denies      History  of heavy alcohol intake?  Patient denies   History of heavy tobacco use?  Patient denies   Family history of dementia?  She has 1 sister with dementia.   Masters degree, and she is fluent in [place], [place] and [place].   Neuropsychological evaluation, Dr. Milbert Lee 10/05/2021 Briefly, results suggested diffuse cognitive impairment often in severe ranges relative to age-matched peers. Performances were adequate across basic attention, phonemic fluency, and some visuoconstructional tasks (i.e., figure copy, clock drawing). However, severe impairment was exhibited across all other assessed domains. This includes processing speed, complex attention, executive functioning, safety/judgment, receptive language, semantic fluency, confrontation naming, and all aspects of learning and memory. Given diffuse impairment, the etiology of ongoing impairment is difficult to discern. Despite this, consideration needs to be given to an underlying Alzheimer's disease presentation. Across memory measures, [name] did not benefit from repeated exposure to information, exhibited minimal ability to recall previously learned information after a brief delay, and performed poorly across yes/no recognition trials. Taken together, this suggests evidence for rapid forgetting and an information storage deficit, which are the hallmark memory characteristics of this illness. Further impairments surrounding expressive language (i.e., confrontation naming and semantic fluency) also fall in line with typical and expected disease trajectory, further strengthening concerns. Despite [name]'s stated beliefs during interview, current memory impairments unfortunately cannot be attributed to simple age-related changes. Despite reported stressors, she denied acute symptoms of anxiety and depression across related questionnaires.   Past Medical History:  Diagnosis Date   Adjustment disorder with depressed mood 03/14/2021   Allergic rhinitis  07/14/2014   Allergic urticaria 12/23/2017   CAD (coronary artery disease) 09/25/2017   Last Assessment & Plan:  History of CAD status post non-STEMI 09/02/2017 at [facility] in [place].  She underwent proximal LAD stenting via a radial approach.  EF was 45 to 50% she is on dual and type of therapy with aspirin and Brilinta.  We will recheck a 2D echo for LV fu   Chronic kidney disease, stage III (moderate) 05/02/2015   DDD (degenerative disc disease), lumbosacral 09/13/2015   Noted during 09/08/15 ed visit (L4-L5 and L5-S1) Noted during 09/08/15 ed visit (L4-L5 and L5-S1)  Formatting of this note might be different from the original. Overview:  Noted during 09/08/15 ed visit (L4-L5 and L5-S1) Formatting of this note might be different from the original. Noted during 09/08/15 ed visit (L4-L5 and L5-S1)   Diverticulosis of large intestine 09/13/2015   Incidentally found, extensive, entire colon found on ED visit on CT scan 09/08/15 Incidentally found, extensive, entire colon found on ED visit on CT scan 09/08/15  Formatting of this note might be different from the original. Overview:  Incidentally found, extensive, entire colon found on ED visit on CT scan 09/08/15 Formatting of this note might be different from the original. Incidentally found, e  Essential hypertension 07/14/2014   Gastroesophageal reflux disease with esophagitis 09/04/2017   Hearing loss 08/10/2018   History of oral lesions 10/09/2019   History of uterine cancer 07/14/2014   s/p complete hysterectomy and B oophorectomy at [facility] no radiation. s/p complete hysterectomy and B oophorectomy at [facility] no radiation.  Formatting of this note might be different from the original. Overview:  s/p complete hysterectomy and B oophorectomy at [facility] no radiation. Formatting of this note might be different from the original. s/p complete hysterectomy and B oophorectomy at [facility] no radiati   Hyperglycemia 09/29/2020   Hyperlipidemia associated with  type 2 diabetes mellitus 12/31/2016   Hypokalemia 10/28/2015   Intrinsic atopic dermatitis 12/23/2017   Low back pain 09/25/2017   Mild cognitive impairment with memory loss 10/05/2021   Myalgia 02/09/2018   Myocardial infarction 12/23/2017   Nuclear sclerotic cataract of left eye 11/09/2020   Otitis externa 08/10/2018   Seborrheic dermatitis of scalp 09/13/2015   Suspected   Tinnitus of left ear 09/29/2020   Type II diabetes mellitus 12/23/2017   Urticaria    Varicose veins of right lower extremity with complications 05/08/2018     Past Surgical History:  Procedure Laterality Date   ABDOMINAL HYSTERECTOMY  2006   b/l SPO and TAH, uterine cancer   EYE SURGERY Right 1960   TUBAL LIGATION       Allergies  Allergen Reactions   Amoxicillin Anaphylaxis   Hydroxyzine Anaphylaxis   Sulfa Antibiotics Swelling    Rash      Current Outpatient Medications  Medication Instructions   Accu-Chek Softclix Lancets lancets Use as instructed   Acetaminophen (TYLENOL PO) Oral, As needed   amLODipine (NORVASC) 5 mg, Oral, Daily   aspirin EC 81 mg, Oral, Daily   atorvastatin (LIPITOR) 80 MG tablet TAKE 1 TABLET BY MOUTH EVERY DAY   augmented betamethasone dipropionate (DIPROLENE-AF) 0.05 % cream Topical, Daily   Calcium Carbonate-Vit D-Min (CALCIUM 600+D PLUS MINERALS) 600-400 MG-UNIT TABS 2 tablets, Oral, Daily   clopidogrel (PLAVIX) 75 MG tablet TAKE 1 TABLET BY MOUTH EVERY DAY   diclofenac Sodium (VOLTAREN) 4 g, Topical, 4 times daily PRN   fluticasone (FLONASE) 50 MCG/ACT nasal spray 2 sprays, Each Nare, Daily   lisinopril (ZESTRIL) 20 mg, Oral, Daily   memantine (NAMENDA) 10 MG tablet Take 1 tablet (10 mg at night) for 2 weeks, then increase to 1 tablet (10 mg) twice a day   montelukast (SINGULAIR) 10 MG tablet TAKE 1 TABLET BY MOUTH EVERYDAY AT BEDTIME   OVER THE COUNTER MEDICATION As needed   polyethylene glycol (MIRALAX / GLYCOLAX) 17 g, Oral, As needed   sucralfate (CARAFATE) 1  g, Oral, 3 times daily with meals & bedtime     VITALS:   Vitals:   11/16/21 0951  BP: (!) 153/72  Pulse: 71  SpO2: 98%  Weight: 140 lb (63.5 kg)  Height: 5' 1.5" (1.562 m)      09/29/2020    1:55 PM 09/25/2019    2:03 PM 07/22/2018    1:46 PM  Depression screen PHQ 2/9  Decreased Interest 0 0 0  Down, Depressed, Hopeless 3 0 0  PHQ - 2 Score 3 0 0  Altered sleeping 1    Tired, decreased energy 1    Change in appetite 0    Feeling bad or failure about yourself  0    Trouble concentrating 0    Moving slowly or fidgety/restless 0    Suicidal thoughts 0  PHQ-9 Score 5    Difficult doing work/chores Not difficult at all      PHYSICAL EXAM   HEENT:  Normocephalic, atraumatic. The mucous membranes are moist. The superficial temporal arteries are without ropiness or tenderness. Cardiovascular: Regular rate and rhythm. Lungs: Clear to auscultation bilaterally. Neck: There are no carotid bruits noted bilaterally.  NEUROLOGICAL:     No data to display             08/01/2021    3:45 PM  MMSE - Mini Mental State Exam  Orientation to time 3  Orientation to Place 2  Registration 3  Attention/ Calculation 5  Recall 2  Language- name 2 objects 2  Language- repeat 1  Language- follow 3 step command 3  Language- read & follow direction 1  Write a sentence 1  Copy design 0  Total score 23     Orientation:  Alert and oriented to person, place and not to time. No aphasia or dysarthria. Fund of knowledge is appropriate. Recent and remote memory are impaired.  Attention and concentration are normal.  Able to name objects.  Cranial nerves: There is good facial symmetry. Extraocular muscles are intact and visual fields are full to confrontational testing. Speech is fluent and clear, at times tangential. Soft palate rises symmetrically and there is no tongue deviation. Hearing is intact to conversational tone. Tone: Tone is good throughout. Sensation: Sensation is intact to  light touch and pinprick throughout. Vibration is intact at the bilateral big toe.There is no extinction with double simultaneous stimulation. There is no sensory dermatomal level identified. Coordination: The patient has no difficulty with RAM's or FNF bilaterally. Normal finger to nose  Motor: Strength is 5/5 in the bilateral upper and lower extremities. There is no pronator drift. There are no fasciculations noted. DTR's: Deep tendon reflexes are 2/4 at the bilateral biceps, triceps, brachioradialis, patella and achilles.  Plantar responses are downgoing bilaterally. Gait and Station: The patient is able to ambulate without difficulty.The patient is able to heel toe walk without any difficulty.The patient is able to ambulate in a tandem fashion. The patient is able to stand in the Romberg position.    Thank you for allowing [place] the opportunity to participate in the care of this nice patient. Please do not hesitate to contact us for any questions or concerns.   Total time spent on today's visit was 61 minutes dedicated to this patient today, preparing to see patient, examining the patient, ordering tests and/or medications and counseling the patient, documenting clinical information in the EHR or other health record, independently interpreting results and communicating results to the patient/family, discussing treatment and goals, answering patient's questions and coordinating care.  Cc:  Kristina Canary, MD  Marlowe Kays 11/17/2021 3:06 PM

## 2021-11-16 ENCOUNTER — Ambulatory Visit: Payer: Medicare PPO | Admitting: Physician Assistant

## 2021-11-16 ENCOUNTER — Encounter: Payer: Self-pay | Admitting: Physician Assistant

## 2021-11-16 VITALS — BP 153/72 | HR 71 | Ht 61.5 in | Wt 140.0 lb

## 2021-11-16 DIAGNOSIS — F0280 Dementia in other diseases classified elsewhere without behavioral disturbance: Secondary | ICD-10-CM

## 2021-11-16 DIAGNOSIS — G309 Alzheimer's disease, unspecified: Secondary | ICD-10-CM | POA: Diagnosis not present

## 2021-11-16 MED ORDER — MEMANTINE HCL 10 MG PO TABS: ORAL_TABLET | ORAL | 11 refills | Status: DC

## 2021-11-16 NOTE — Patient Instructions (Addendum)
It was a pleasure to see you today at our office.   Recommendations:  Follow up in  6 months Feb 6 at 11:30   Start Memantine 10 mg: Take 1 tablet (10 mg at night) for 2 weeks, then increase to 1 tablet (10 mg) twice a day.     Whom to call:  Memory  decline, memory medications: Call our office [phone]   For psychiatric meds, mood meds: Please have your primary care physician manage these medications.   Counseling regarding caregiver distress, including caregiver depression, anxiety and issues regarding community resources, adult day care programs, adult living facilities, or memory care questions:   Feel free to contact Misty Lisabeth Register, Social Worker at [phone]   For assessment of decision of mental capacity and competency:  Call Dr. Erick Blinks, geriatric psychiatrist at [phone]  For guidance in geriatric dementia issues please call [facility] [phone]  For guidance regarding [facility] and if placement were needed at the facility, contact Sidney Ace, Social Worker tel: [phone]  If you have any severe symptoms of a stroke, or other severe issues such as confusion,severe chills or fever, etc call 911 or go to the ER as you may need to be evaluated further   Feel free to visit Facebook page " [email or web address]" for tips of how to care for people with memory problems.        RECOMMENDATIONS FOR ALL PATIENTS WITH MEMORY PROBLEMS: 1. Continue to exercise (Recommend 30 minutes of walking everyday, or 3 hours every week) 2. Increase social interactions - continue going to [place] and enjoy social gatherings with friends and family 3. Eat healthy, avoid fried foods and eat more fruits and vegetables 4. Maintain adequate blood pressure, blood sugar, and blood cholesterol level. Reducing the risk of stroke and cardiovascular disease also helps promoting better memory. 5. Avoid stressful situations. Live a simple life and avoid  aggravations. Organize your time and prepare for the next day in anticipation. 6. Sleep well, avoid any interruptions of sleep and avoid any distractions in the bedroom that may interfere with adequate sleep quality 7. Avoid sugar, avoid sweets as there is a strong link between excessive sugar intake, diabetes, and cognitive impairment We discussed the Mediterranean diet, which has been shown to help patients reduce the risk of progressive memory disorders and reduces cardiovascular risk. This includes eating fish, eat fruits and green leafy vegetables, nuts like almonds and hazelnuts, walnuts, and also use olive oil. Avoid fast foods and fried foods as much as possible. Avoid sweets and sugar as sugar use has been linked to worsening of memory function.  There is always a concern of gradual progression of memory problems. If this is the case, then we may need to adjust level of care according to patient needs. Support, both to the patient and caregiver, should then be put into place.    The Alzheimer's Association is here all day, every day for people facing Alzheimer's disease through our free 24/7 Helpline: [phone]. The Helpline provides reliable information and support to all those who need assistance, such as individuals living with memory loss, Alzheimer's or other dementia, caregivers, health care professionals and the public.  Our highly trained and knowledgeable staff can help you with: Understanding memory loss, dementia and Alzheimer's  Medications and other treatment options  General information about aging and brain health  Skills to provide quality care and to find the best care from professionals  Legal, financial and living-arrangement decisions  Our Helpline also features: Confidential care consultation provided by master's level clinicians who can help with decision-making support, crisis assistance and education on issues families face every day  Help in a caller's preferred  language using our translation service that features more than 200 languages and dialects  Referrals to local community programs, services and ongoing support     FALL PRECAUTIONS: Be cautious when walking. Scan the area for obstacles that may increase the risk of trips and falls. When getting up in the mornings, sit up at the edge of the bed for a few minutes before getting out of bed. Consider elevating the bed at the head end to avoid drop of blood pressure when getting up. Walk always in a well-lit room (use night lights in the walls). Avoid area rugs or power cords from appliances in the middle of the walkways. Use a walker or a cane if necessary and consider physical therapy for balance exercise. Get your eyesight checked regularly.  FINANCIAL OVERSIGHT: Supervision, especially oversight when making financial decisions or transactions is also recommended.  HOME SAFETY: Consider the safety of the kitchen when operating appliances like stoves, microwave oven, and blender. Consider having supervision and share cooking responsibilities until no longer able to participate in those. Accidents with firearms and other hazards in the house should be identified and addressed as well.   ABILITY TO BE LEFT ALONE: If patient is unable to contact 911 operator, consider using LifeLine, or when the need is there, arrange for someone to stay with patients. Smoking is a fire hazard, consider supervision or cessation. Risk of wandering should be assessed by caregiver and if detected at any point, supervision and safe proof recommendations should be instituted.  MEDICATION SUPERVISION: Inability to self-administer medication needs to be constantly addressed. Implement a mechanism to ensure safe administration of the medications.   DRIVING: Regarding driving, in patients with progressive memory problems, driving will be impaired. We advise to have someone else do the driving if trouble finding directions or if  minor accidents are reported. Independent driving assessment is available to determine safety of driving.   If you are interested in the driving assessment, you can contact the following:  [employer] in [place] [phone]  [facility] [phone]  [facility] [phone] or [phone]      Mediterranean Diet A Mediterranean diet refers to food and lifestyle choices that are based on the traditions of countries located on the [employer]. This way of eating has been shown to help prevent certain conditions and improve outcomes for people who have chronic diseases, like kidney disease and heart disease. What are tips for following this plan? Lifestyle  Cook and eat meals together with your family, when possible. Drink enough fluid to keep your urine clear or pale yellow. Be physically active every day. This includes: Aerobic exercise like running or swimming. Leisure activities like gardening, walking, or housework. Get 7-8 hours of sleep each night. If recommended by your health care provider, drink red wine in moderation. This means 1 glass a day for nonpregnant women and 2 glasses a day for men. A glass of wine equals 5 oz (150 mL). Reading food labels  Check the serving size of packaged foods. For foods such as rice and pasta, the serving size refers to the amount of cooked product, not dry. Check the total fat in packaged foods. Avoid foods that have saturated fat or trans fats. Check the ingredients list  for added sugars, such as corn syrup. Shopping  At the grocery store, buy most of your food from the areas near the walls of the store. This includes: Fresh fruits and vegetables (produce). Grains, beans, nuts, and seeds. Some of these may be available in unpackaged forms or large amounts (in bulk). Fresh seafood. Poultry and eggs. Low-fat dairy products. Buy whole ingredients instead of  prepackaged foods. Buy fresh fruits and vegetables in-season from local farmers markets. Buy frozen fruits and vegetables in resealable bags. If you do not have access to quality fresh seafood, buy precooked frozen shrimp or canned fish, such as tuna, salmon, or sardines. Buy small amounts of raw or cooked vegetables, salads, or olives from the deli or salad bar at your store. Stock your pantry so you always have certain foods on hand, such as olive oil, canned tuna, canned tomatoes, rice, pasta, and beans. Cooking  Cook foods with extra-virgin olive oil instead of using butter or other vegetable oils. Have meat as a side dish, and have vegetables or grains as your main dish. This means having meat in small portions or adding small amounts of meat to foods like pasta or stew. Use beans or vegetables instead of meat in common dishes like chili or lasagna. Experiment with different cooking methods. Try roasting or broiling vegetables instead of steaming or sauteing them. Add frozen vegetables to soups, stews, pasta, or rice. Add nuts or seeds for added healthy fat at each meal. You can add these to yogurt, salads, or vegetable dishes. Marinate fish or vegetables using olive oil, lemon juice, garlic, and fresh herbs. Meal planning  Plan to eat 1 vegetarian meal one day each week. Try to work up to 2 vegetarian meals, if possible. Eat seafood 2 or more times a week. Have healthy snacks readily available, such as: Vegetable sticks with hummus. Greek yogurt. Fruit and nut trail mix. Eat balanced meals throughout the week. This includes: Fruit: 2-3 servings a day Vegetables: 4-5 servings a day Low-fat dairy: 2 servings a day Fish, poultry, or lean meat: 1 serving a day Beans and legumes: 2 or more servings a week Nuts and seeds: 1-2 servings a day Whole grains: 6-8 servings a day Extra-virgin olive oil: 3-4 servings a day Limit red meat and sweets to only a few servings a month What are my  food choices? Mediterranean diet Recommended Grains: Whole-grain pasta. Brown rice. Bulgar wheat. Polenta. Couscous. Whole-wheat bread. Orpah Cobb. Vegetables: Artichokes. Beets. Broccoli. Cabbage. Carrots. Eggplant. Green beans. Chard. Kale. Spinach. Onions. Leeks. Peas. Squash. Tomatoes. Peppers. Radishes. Fruits: Apples. Apricots. Avocado. Berries. Bananas. Cherries. Dates. Figs. Grapes. Lemons. Melon. Oranges. Peaches. Plums. Pomegranate. Meats and other protein foods: Beans. Almonds. Sunflower seeds. Pine nuts. Peanuts. Cod. Salmon. Scallops. Shrimp. Tuna. Tilapia. Clams. Oysters. Eggs. Dairy: Low-fat milk. Cheese. Greek yogurt. Beverages: Water. Red wine. Herbal tea. Fats and oils: Extra virgin olive oil. Avocado oil. Grape seed oil. Sweets and desserts: [place] yogurt with honey. Baked apples. Poached pears. Trail mix. Seasoning and other foods: Basil. Cilantro. Coriander. Cumin. Mint. Parsley. Sage. Rosemary. Tarragon. Garlic. Oregano. Thyme. Pepper. Balsalmic vinegar. Tahini. Hummus. Tomato sauce. Olives. Mushrooms. Limit these Grains: Prepackaged pasta or rice dishes. Prepackaged cereal with added sugar. Vegetables: Deep fried potatoes (french fries). Fruits: Fruit canned in syrup. Meats and other protein foods: Beef. Pork. Lamb. Poultry with skin. Hot dogs. Tomasa Blase. Dairy: Ice cream. Sour cream. Whole milk. Beverages: Juice. Sugar-sweetened soft drinks. Beer. Liquor and spirits. Fats and oils: Butter. Canola oil. Vegetable  oil. Beef fat (tallow). Lard. Sweets and desserts: Cookies. Cakes. Pies. Candy. Seasoning and other foods: Mayonnaise. Premade sauces and marinades. The items listed may not be a complete list. Talk with your dietitian about what dietary choices are right for you. Summary The Mediterranean diet includes both food and lifestyle choices. Eat a variety of fresh fruits and vegetables, beans, nuts, seeds, and whole grains. Limit the amount of red meat and sweets  that you eat. Talk with your health care provider about whether it is safe for you to drink red wine in moderation. This means 1 glass a day for nonpregnant women and 2 glasses a day for men. A glass of wine equals 5 oz (150 mL). This information is not intended to replace advice given to you by your health care provider. Make sure you discuss any questions you have with your health care provider. Document Released: 11/24/2015 Document Revised: 12/27/2015 Document Reviewed: 11/24/2015 Elsevier Interactive Patient Education  2017 [employer].

## 2021-11-17 ENCOUNTER — Other Ambulatory Visit: Payer: Self-pay

## 2021-11-17 DIAGNOSIS — F0280 Dementia in other diseases classified elsewhere without behavioral disturbance: Secondary | ICD-10-CM

## 2021-11-21 ENCOUNTER — Ambulatory Visit (INDEPENDENT_AMBULATORY_CARE_PROVIDER_SITE_OTHER): Payer: Medicare PPO

## 2021-11-21 ENCOUNTER — Other Ambulatory Visit: Payer: Self-pay | Admitting: Physician Assistant

## 2021-11-21 ENCOUNTER — Telehealth: Payer: Self-pay

## 2021-11-21 DIAGNOSIS — Z0000 Encounter for general adult medical examination without abnormal findings: Secondary | ICD-10-CM | POA: Diagnosis not present

## 2021-11-21 DIAGNOSIS — F0280 Dementia in other diseases classified elsewhere without behavioral disturbance: Secondary | ICD-10-CM

## 2021-11-21 NOTE — Patient Instructions (Signed)
Kristina Lee , Thank you for taking time to come for your Medicare Wellness Visit. I appreciate your ongoing commitment to your health goals. Please review the following plan we discussed and let me know if I can assist you in the future.   Screening recommendations/referrals: Colonoscopy: no longer needed Mammogram: declined Bone Density: 11/14/20 due 11/15/22 Recommended yearly ophthalmology/optometry visit for glaucoma screening and checkup Recommended yearly dental visit for hygiene and checkup  Vaccinations: Influenza vaccine: up to date Pneumococcal vaccine: up to date Tdap vaccine: up to date Shingles vaccine: Due-May obtain vaccine at your local pharmacy.    Covid-19:Due-May obtain vaccine at your local pharmacy.   Advanced directives: yes, not on file  Conditions/risks identified: see problem list   Next appointment: Follow up in one year for your annual wellness visit    Preventive Care 65 Years and Older, Female Preventive care refers to lifestyle choices and visits with your health care provider that can promote health and wellness. What does preventive care include? A yearly physical exam. This is also called an annual well check. Dental exams once or twice a year. Routine eye exams. Ask your health care provider how often you should have your eyes checked. Personal lifestyle choices, including: Daily care of your teeth and gums. Regular physical activity. Eating a healthy diet. Avoiding tobacco and drug use. Limiting alcohol use. Practicing safe sex. Taking low-dose aspirin every day. Taking vitamin and mineral supplements as recommended by your health care provider. What happens during an annual well check? The services and screenings done by your health care provider during your annual well check will depend on your age, overall health, lifestyle risk factors, and family history of disease. Counseling  Your health care provider may ask you questions about  your: Alcohol use. Tobacco use. Drug use. Emotional well-being. Home and relationship well-being. Sexual activity. Eating habits. History of falls. Memory and ability to understand (cognition). Work and work Astronomer. Reproductive health. Screening  You may have the following tests or measurements: Height, weight, and BMI. Blood pressure. Lipid and cholesterol levels. These may be checked every 5 years, or more frequently if you are over 42 years old. Skin check. Lung cancer screening. You may have this screening every year starting at age 34 if you have a 30-pack-year history of smoking and currently smoke or have quit within the past 15 years. Fecal occult blood test (FOBT) of the stool. You may have this test every year starting at age 51. Flexible sigmoidoscopy or colonoscopy. You may have a sigmoidoscopy every 5 years or a colonoscopy every 10 years starting at age 87. Hepatitis C blood test. Hepatitis B blood test. Sexually transmitted disease (STD) testing. Diabetes screening. This is done by checking your blood sugar (glucose) after you have not eaten for a while (fasting). You may have this done every 1-3 years. Bone density scan. This is done to screen for osteoporosis. You may have this done starting at age 27. Mammogram. This may be done every 1-2 years. Talk to your health care provider about how often you should have regular mammograms. Talk with your health care provider about your test results, treatment options, and if necessary, the need for more tests. Vaccines  Your health care provider may recommend certain vaccines, such as: Influenza vaccine. This is recommended every year. Tetanus, diphtheria, and acellular pertussis (Tdap, Td) vaccine. You may need a Td booster every 10 years. Zoster vaccine. You may need this after age 6. Pneumococcal 13-valent conjugate (PCV13) vaccine.  One dose is recommended after age 40. Pneumococcal polysaccharide (PPSV23) vaccine.  One dose is recommended after age 1. Talk to your health care provider about which screenings and vaccines you need and how often you need them. This information is not intended to replace advice given to you by your health care provider. Make sure you discuss any questions you have with your health care provider. Document Released: 04/29/2015 Document Revised: 12/21/2015 Document Reviewed: 02/01/2015 Elsevier Interactive Patient Education  2017 [place] Prevention in the Home Falls can cause injuries. They can happen to people of all ages. There are many things you can do to make your home safe and to help prevent falls. What can I do on the outside of my home? Regularly fix the edges of walkways and driveways and fix any cracks. Remove anything that might make you trip as you walk through a door, such as a raised step or threshold. Trim any bushes or trees on the path to your home. Use bright outdoor lighting. Clear any walking paths of anything that might make someone trip, such as rocks or tools. Regularly check to see if handrails are loose or broken. Make sure that both sides of any steps have handrails. Any raised decks and porches should have guardrails on the edges. Have any leaves, snow, or ice cleared regularly. Use sand or salt on walking paths during winter. Clean up any spills in your garage right away. This includes oil or grease spills. What can I do in the bathroom? Use night lights. Install grab bars by the toilet and in the tub and shower. Do not use towel bars as grab bars. Use non-skid mats or decals in the tub or shower. If you need to sit down in the shower, use a plastic, non-slip stool. Keep the floor dry. Clean up any water that spills on the floor as soon as it happens. Remove soap buildup in the tub or shower regularly. Attach bath mats securely with double-sided non-slip rug tape. Do not have throw rugs and other things on the floor that can make  you trip. What can I do in the bedroom? Use night lights. Make sure that you have a light by your bed that is easy to reach. Do not use any sheets or blankets that are too big for your bed. They should not hang down onto the floor. Have a firm chair that has side arms. You can use this for support while you get dressed. Do not have throw rugs and other things on the floor that can make you trip. What can I do in the kitchen? Clean up any spills right away. Avoid walking on wet floors. Keep items that you use a lot in easy-to-reach places. If you need to reach something above you, use a strong step stool that has a grab bar. Keep electrical cords out of the way. Do not use floor polish or wax that makes floors slippery. If you must use wax, use non-skid floor wax. Do not have throw rugs and other things on the floor that can make you trip. What can I do with my stairs? Do not leave any items on the stairs. Make sure that there are handrails on both sides of the stairs and use them. Fix handrails that are broken or loose. Make sure that handrails are as long as the stairways. Check any carpeting to make sure that it is firmly attached to the stairs. Fix any carpet that is loose or  worn. Avoid having throw rugs at the top or bottom of the stairs. If you do have throw rugs, attach them to the floor with carpet tape. Make sure that you have a light switch at the top of the stairs and the bottom of the stairs. If you do not have them, ask someone to add them for you. What else can I do to help prevent falls? Wear shoes that: Do not have high heels. Have rubber bottoms. Are comfortable and fit you well. Are closed at the toe. Do not wear sandals. If you use a stepladder: Make sure that it is fully opened. Do not climb a closed stepladder. Make sure that both sides of the stepladder are locked into place. Ask someone to hold it for you, if possible. Clearly mark and make sure that you can  see: Any grab bars or handrails. First and last steps. Where the edge of each step is. Use tools that help you move around (mobility aids) if they are needed. These include: Canes. Walkers. Scooters. Crutches. Turn on the lights when you go into a dark area. Replace any light bulbs as soon as they burn out. Set up your furniture so you have a clear path. Avoid moving your furniture around. If any of your floors are uneven, fix them. If there are any pets around you, be aware of where they are. Review your medicines with your doctor. Some medicines can make you feel dizzy. This can increase your chance of falling. Ask your doctor what other things that you can do to help prevent falls. This information is not intended to replace advice given to you by your health care provider. Make sure you discuss any questions you have with your health care provider. Document Released: 01/27/2009 Document Revised: 09/08/2015 Document Reviewed: 05/07/2014 Elsevier Interactive Patient Education  2017 [employer].

## 2021-11-21 NOTE — Progress Notes (Signed)
Subjective:   Kristina Lee is a 76 y.o. female who presents for Medicare Annual (Subsequent) preventive examination.  I connected with  [name] on 11/21/21 by a audio enabled telemedicine application and verified that I am speaking with the correct person using two identifiers.  Patient Location: Home  Provider Location: Office/Clinic  I discussed the limitations of evaluation and management by telemedicine. The patient expressed understanding and agreed to proceed.   Review of Systems     Cardiac Risk Factors include: advanced age (>34men, >40 women);hypertension;diabetes mellitus;dyslipidemia     Objective:    Today's Vitals   11/21/21 1109  PainSc: 6    There is no height or weight on file to calculate BMI.     11/21/2021   11:07 AM 11/16/2021    9:56 AM 09/29/2020    1:49 PM 09/25/2019    1:56 PM 08/03/2019    2:45 PM 07/22/2018    1:45 PM 04/29/2018    2:29 PM  Advanced Directives  Does Patient Have a Medical Advance Directive? Yes Yes Yes Yes Yes Yes Yes  Type of Estate agent of [place];Living will  Healthcare Power of [place];Living will Healthcare Power of [place];Living will  Healthcare Power of [place];Living will   Does patient want to make changes to medical advance directive? No - Patient declined   No - Patient declined No - Patient declined No - Guardian declined No - Patient declined  Copy of Healthcare Power of Attorney in Chart? No - copy requested  No - copy requested No - copy requested  No - copy requested     Current Medications (verified) Outpatient Encounter Medications as of 11/21/2021  Medication Sig   Acetaminophen (TYLENOL PO) Take by mouth as needed.   aspirin EC 81 MG tablet Take 81 mg by mouth daily.   atorvastatin (LIPITOR) 80 MG tablet TAKE 1 TABLET BY MOUTH EVERY DAY   augmented betamethasone dipropionate (DIPROLENE-AF) 0.05 % cream Apply topically daily.   Calcium Carbonate-Vit D-Min (CALCIUM 600+D PLUS  MINERALS) 600-400 MG-UNIT TABS Take 2 tablets by mouth daily.    diclofenac Sodium (VOLTAREN) 1 % GEL Apply 4 g topically 4 (four) times daily as needed.   lisinopril (ZESTRIL) 20 MG tablet Take 1 tablet (20 mg total) by mouth daily.   memantine (NAMENDA) 10 MG tablet Take 1 tablet (10 mg at night) for 2 weeks, then increase to 1 tablet (10 mg) twice a day   OVER THE COUNTER MEDICATION as needed.   fluticasone (FLONASE) 50 MCG/ACT nasal spray Place 2 sprays into both nostrils daily. (Patient not taking: Reported on 11/16/2021)   polyethylene glycol (MIRALAX / GLYCOLAX) 17 g packet Take 17 g by mouth as needed. (Patient not taking: Reported on 11/21/2021)   [DISCONTINUED] Accu-Chek Softclix Lancets lancets Use as instructed   [DISCONTINUED] amLODipine (NORVASC) 5 MG tablet TAKE 1 TABLET (5 MG TOTAL) BY MOUTH DAILY.   [DISCONTINUED] clopidogrel (PLAVIX) 75 MG tablet TAKE 1 TABLET BY MOUTH EVERY DAY   [DISCONTINUED] montelukast (SINGULAIR) 10 MG tablet TAKE 1 TABLET BY MOUTH EVERYDAY AT BEDTIME   [DISCONTINUED] sucralfate (CARAFATE) 1 GM/10ML suspension Take 10 mLs (1 g total) by mouth 4 (four) times daily -  with meals and at bedtime.   No facility-administered encounter medications on file as of 11/21/2021.    Allergies (verified) Amoxicillin, Hydroxyzine, and Sulfa antibiotics   History: Past Medical History:  Diagnosis Date   Adjustment disorder with depressed mood 03/14/2021  Allergic rhinitis 07/14/2014   Allergic urticaria 12/23/2017   CAD (coronary artery disease) 09/25/2017   Last Assessment & Plan:  History of CAD status post non-STEMI 09/02/2017 at [facility] in [place].  She underwent proximal LAD stenting via a radial approach.  EF was 45 to 50% she is on dual and type of therapy with aspirin and Brilinta.  We will recheck a 2D echo for LV fu   Chronic kidney disease, stage III (moderate) 05/02/2015   DDD (degenerative disc disease), lumbosacral 09/13/2015   Noted during  09/08/15 ed visit (L4-L5 and L5-S1) Noted during 09/08/15 ed visit (L4-L5 and L5-S1)  Formatting of this note might be different from the original. Overview:  Noted during 09/08/15 ed visit (L4-L5 and L5-S1) Formatting of this note might be different from the original. Noted during 09/08/15 ed visit (L4-L5 and L5-S1)   Diverticulosis of large intestine 09/13/2015   Incidentally found, extensive, entire colon found on ED visit on CT scan 09/08/15 Incidentally found, extensive, entire colon found on ED visit on CT scan 09/08/15  Formatting of this note might be different from the original. Overview:  Incidentally found, extensive, entire colon found on ED visit on CT scan 09/08/15 Formatting of this note might be different from the original. Incidentally found, e   Essential hypertension 07/14/2014   Gastroesophageal reflux disease with esophagitis 09/04/2017   Hearing loss 08/10/2018   History of oral lesions 10/09/2019   History of uterine cancer 07/14/2014   s/p complete hysterectomy and B oophorectomy at [facility] no radiation. s/p complete hysterectomy and B oophorectomy at [facility] no radiation.  Formatting of this note might be different from the original. Overview:  s/p complete hysterectomy and B oophorectomy at [facility] no radiation. Formatting of this note might be different from the original. s/p complete hysterectomy and B oophorectomy at [facility] no radiati   Hyperglycemia 09/29/2020   Hyperlipidemia associated with type 2 diabetes mellitus 12/31/2016   Hypokalemia 10/28/2015   Intrinsic atopic dermatitis 12/23/2017   Low back pain 09/25/2017   Mild cognitive impairment with memory loss 10/05/2021   Myalgia 02/09/2018   Myocardial infarction 12/23/2017   Nuclear sclerotic cataract of left eye 11/09/2020   Otitis externa 08/10/2018   Seborrheic dermatitis of scalp 09/13/2015   Suspected   Tinnitus of left ear 09/29/2020   Type II diabetes mellitus 12/23/2017   Urticaria    Varicose veins of right lower  extremity with complications 05/08/2018   Past Surgical History:  Procedure Laterality Date   ABDOMINAL HYSTERECTOMY  2006   b/l SPO and TAH, uterine cancer   EYE SURGERY Right 1960   TUBAL LIGATION     Family History  Problem Relation Age of Onset   Cancer Mother        liver cancer, breast cancer x 2    Alcohol abuse Mother        smoker   Heart disease Father        MI   Alcohol abuse Father        smoker   Depression Sister    Tremor Sister    Dementia Sister    Cancer Brother        pancreatic   Diabetes Maternal Grandmother    Hearing loss Maternal Grandmother    Depression Maternal Grandmother    Healthy Son    Allergic rhinitis Neg Hx    Angioedema Neg Hx    Asthma Neg Hx    Eczema Neg Hx  Immunodeficiency Neg Hx    Urticaria Neg Hx    Social History   Socioeconomic History   Marital status: Married    Spouse name: Not on file   Number of children: 3   Years of education: 62   Highest education level: Master's degree (e.g., MA, MS, MEng, MEd, MSW, MBA)  Occupational History   Occupation: Retired    Comment: Runner, broadcasting/film/video  Tobacco Use   Smoking status: Former    Packs/day: 0.50    Years: 24.00    Total pack years: 12.00    Types: Cigarettes    Quit date: 04/17/1987    Years since quitting: 34.6   Smokeless tobacco: Never  Vaping Use   Vaping Use: Never used  Substance and Sexual Activity   Alcohol use: Yes    Comment: occ   Drug use: Not Currently   Sexual activity: Yes  Other Topics Concern   Not on file  Social History Narrative   Right handed   Drinks caffeine   2 story home   Social Determinants of Health   Financial Resource Strain: Low Risk  (09/29/2020)   Overall Financial Resource Strain (CARDIA)    Difficulty of Paying Living Expenses: Not hard at all  Food Insecurity: No Food Insecurity (09/29/2020)   Hunger Vital Sign    Worried About Running Out of Food in the Last Year: Never true    Ran Out of Food in the Last Year: Never true   Transportation Needs: No Transportation Needs (09/29/2020)   PRAPARE - Administrator, Civil Service (Medical): No    Lack of Transportation (Non-Medical): No  Physical Activity: Inactive (09/29/2020)   Exercise Vital Sign    Days of Exercise per Week: 0 days    Minutes of Exercise per Session: 0 min  Stress: Stress Concern Present (09/29/2020)   [employer] of Occupational Health - Occupational Stress Questionnaire    Feeling of Stress : To some extent  Social Connections: Moderately Isolated (09/29/2020)   Social Connection and Isolation Panel [NHANES]    Frequency of Communication with Friends and Family: More than three times a week    Frequency of Social Gatherings with Friends and Family: Once a week    Attends Religious Services: Never    Database administrator or Organizations: No    Attends Engineer, structural: Never    Marital Status: Married    Tobacco Counseling Counseling given: Not Answered   Clinical Intake:  Pre-visit preparation completed: Yes  Pain : 0-10 Pain Score: 6  Pain Type: Chronic pain Pain Location: Back Pain Descriptors / Indicators: Aching Pain Onset: More than a month ago Pain Frequency: Intermittent     BMI - recorded: 26.02 Nutritional Status: BMI 25 -29 Overweight Nutritional Risks: Nausea/ vomitting/ diarrhea Diabetes: Yes CBG done?: No Did pt. bring in CBG monitor from home?: No  How often do you need to have someone help you when you read instructions, pamphlets, or other written materials from your doctor or pharmacy?: 1 - Never  Diabetic?yes Nutrition Risk Assessment:  Has the patient had any N/V/D within the last 2 months?  Yes  Does the patient have any non-healing wounds?  No  Has the patient had any unintentional weight loss or weight gain?  No   Diabetes:  Is the patient diabetic?  Yes  If diabetic, was a CBG obtained today?  No  Did the patient bring in their glucometer from home?  No  How  often do you monitor your CBG's? N/a.   Financial Strains and Diabetes Management:  Are you having any financial strains with the device, your supplies or your medication? No .  Does the patient want to be seen by Chronic Care Management for management of their diabetes?  No  Would the patient like to be referred to a Nutritionist or for Diabetic Management?  No   Diabetic Exams:  Diabetic Eye Exam: Completed 09/21/21 Diabetic Foot Exam: Overdue, Pt has been advised about the importance in completing this exam. Pt is scheduled for diabetic foot exam on n/a.   Interpreter Needed?: No  Information entered by :: Grayer Sproles   Activities of Daily Living    11/21/2021   11:27 AM 11/20/2021   12:09 PM  In your present state of health, do you have any difficulty performing the following activities:  Hearing? 1 1  Vision? 0 0  Difficulty concentrating or making decisions? 0 1  Walking or climbing stairs? 0 0  Dressing or bathing? 0 0  Doing errands, shopping? 0 0  Preparing Food and eating ? N N  Using the Toilet? N N  In the past six months, have you accidently leaked urine? N N  Do you have problems with loss of bowel control? N N  Managing your Medications? N N  Managing your Finances? N N  Housekeeping or managing your Housekeeping? N N    Patient Care Team: Bradd Canary, MD as PCP - General (Family Medicine) Runell Gess, MD as PCP - Cardiology (Cardiology) Reginia Naas, MD as Referring Physician (Dermatology)  Indicate any recent Medical Services you may have received from other than [facility] providers in the past year (date may be approximate).     Assessment:   This is a routine wellness examination for Kristina Lee.  Hearing/Vision screen No results found.  Dietary issues and exercise activities discussed: Current Exercise Habits: Home exercise routine, Type of exercise: walking;stretching, Time (Minutes): 10, Frequency (Times/Week): 7, Weekly Exercise  (Minutes/Week): 70, Intensity: Mild, Exercise limited by: None identified   Goals Addressed             This Visit's Progress    Increase physical activity   On track      Depression Screen    11/21/2021   11:08 AM 09/29/2020    1:55 PM 09/25/2019    2:03 PM 07/22/2018    1:46 PM  PHQ 2/9 Scores  PHQ - 2 Score 0 3 0 0  PHQ- 9 Score  5      Fall Risk    11/21/2021   11:08 AM 11/20/2021   12:09 PM 11/16/2021    9:56 AM 11/07/2021   10:42 AM 10/08/2021    9:26 PM  Fall Risk   Falls in the past year? 0 0 0 0   0 0  Number falls in past yr: 0 0 0 0   0 0  Injury with Fall? 0 0 0 0   0 0  Risk for fall due to : No Fall Risks      Follow up Falls evaluation completed        FALL RISK PREVENTION PERTAINING TO THE HOME:  Any stairs in or around the home? Yes  If so, are there any without handrails? No  Home free of loose throw rugs in walkways, pet beds, electrical cords, etc? Yes  Adequate lighting in your home to reduce risk of falls? Yes   ASSISTIVE DEVICES UTILIZED TO  PREVENT FALLS:  Life alert? No  Use of a cane, walker or w/c? No  Grab bars in the bathroom? No  Shower chair or bench in shower? No  Elevated toilet seat or a handicapped toilet? No   TIMED UP AND GO:  Was the test performed? No .    Cognitive Function:    08/01/2021    3:45 PM  MMSE - Mini Mental State Exam  Orientation to time 3  Orientation to Place 2  Registration 3  Attention/ Calculation 5  Recall 2  Language- name 2 objects 2  Language- repeat 1  Language- follow 3 step command 3  Language- read & follow direction 1  Write a sentence 1  Copy design 0  Total score 23        11/21/2021   11:31 AM 09/29/2020    2:15 PM  6CIT Screen  What Year? 0 points 0 points  What month? 0 points 0 points  What time? 0 points 3 points  Count back from 20 0 points 0 points  Months in reverse 2 points 0 points  Repeat phrase 6 points 2 points  Total Score 8 points 5 points     Immunizations Immunization History  Administered Date(s) Administered   Fluad Quad(high Dose 65+) 12/23/2018, 01/22/2020   Influenza, High Dose Seasonal PF 01/18/2015, 02/02/2016, 01/17/2017, 12/31/2017, 01/09/2021   Influenza,inj,Quad PF,6+ Mos 02/14/2014   Influenza-Unspecified 12/31/2017   PFIZER(Purple Top)SARS-COV-2 Vaccination 06/18/2019, 07/14/2019, 03/09/2020   Pneumococcal Conjugate-13 05/27/2013, 05/27/2013   Pneumococcal Polysaccharide-23 04/16/2014, 04/16/2014   Tdap 07/14/2014, 07/14/2014   Zoster, Live 04/17/2011, 04/17/2011    TDAP status: Up to date  Flu Vaccine status: Up to date  Pneumococcal vaccine status: Up to date  Covid-19 vaccine status: Information provided on how to obtain vaccines.   Qualifies for Shingles Vaccine? Yes   Zostavax completed No   Shingrix Completed?: No.    Education has been provided regarding the importance of this vaccine. Patient has been advised to call insurance company to determine out of pocket expense if they have not yet received this vaccine. Advised may also receive vaccine at local pharmacy or Health Dept. Verbalized acceptance and understanding.  Screening Tests Health Maintenance  Topic Date Due   FOOT EXAM  Never done   Zoster Vaccines- Shingrix (1 of 2) Never done   COVID-19 Vaccine (4 - Pfizer series) 05/04/2020   HEMOGLOBIN A1C  09/11/2021   INFLUENZA VACCINE  11/14/2021   OPHTHALMOLOGY EXAM  09/22/2022   TETANUS/TDAP  07/13/2024   Pneumonia Vaccine 34+ Years old  Completed   DEXA SCAN  Completed   Hepatitis C Screening  Completed   HPV VACCINES  Aged Out   COLONOSCOPY (Pts 45-57yrs Insurance coverage will need to be confirmed)  Discontinued    Health Maintenance  Health Maintenance Due  Topic Date Due   FOOT EXAM  Never done   Zoster Vaccines- Shingrix (1 of 2) Never done   COVID-19 Vaccine (4 - Pfizer series) 05/04/2020   HEMOGLOBIN A1C  09/11/2021   INFLUENZA VACCINE  11/14/2021    Colorectal  cancer screening: No longer required.   Mammogram status: Ordered declined. Pt provided with contact info and advised to call to schedule appt.   Bone Density status: Completed 11/14/20. Results reflect: Bone density results: OSTEOPOROSIS. Repeat every 2 years.  Lung Cancer Screening: (Low Dose CT Chest recommended if Age 30-80 years, 30 pack-year currently smoking OR have quit w/in 15years.) does not qualify.   Lung  Cancer Screening Referral: n/a  Additional Screening:  Hepatitis C Screening: does qualify; Completed 09/29/20  Vision Screening: Recommended annual ophthalmology exams for early detection of glaucoma and other disorders of the eye. Is the patient up to date with their annual eye exam?  Yes  Who is the provider or what is the name of the office in which the patient attends annual eye exams? [facility] If pt is not established with a provider, would they like to be referred to a provider to establish care? No .   Dental Screening: Recommended annual dental exams for proper oral hygiene  Community Resource Referral / Chronic Care Management: CRR required this visit?  No   CCM required this visit?  No      Plan:     I have personally reviewed and noted the following in the patient's chart:   Medical and social history Use of alcohol, tobacco or illicit drugs  Current medications and supplements including opioid prescriptions.  Functional ability and status Nutritional status Physical activity Advanced directives List of other physicians Hospitalizations, surgeries, and ER visits in previous 12 months Vitals Screenings to include cognitive, depression, and falls Referrals and appointments  In addition, I have reviewed and discussed with patient certain preventive protocols, quality metrics, and best practice recommendations. A written personalized care plan for preventive services as well as general preventive health recommendations were provided to patient.   Due to  this being a telephonic visit, the after visit summary with patients personalized plan was offered to patient via mail or my-chart.  Patient would like to access on my-chart.  Kristina Lee, CMA   11/21/2021   Nurse Notes: none

## 2021-11-21 NOTE — Telephone Encounter (Signed)
Pt would like a referral to see the specialist for the same pain she had in shoulders about 3 years. Pt could not remember his name but said you would remember.

## 2021-11-22 ENCOUNTER — Other Ambulatory Visit: Payer: Self-pay

## 2021-11-22 DIAGNOSIS — M25511 Pain in right shoulder: Secondary | ICD-10-CM

## 2021-11-22 NOTE — Telephone Encounter (Signed)
Referral order placed.

## 2021-11-22 NOTE — Telephone Encounter (Signed)
Lvm to call back

## 2021-11-22 NOTE — Telephone Encounter (Signed)
Patient returned Shaakira's call regarding referral. Patient does not have a preference on the doctor. She did see Dr. Otelia Sergeant but does not have to see him specifically. Please call patient back to advise.

## 2021-11-29 ENCOUNTER — Ambulatory Visit: Payer: Self-pay

## 2021-11-29 ENCOUNTER — Ambulatory Visit (INDEPENDENT_AMBULATORY_CARE_PROVIDER_SITE_OTHER): Payer: Medicare PPO

## 2021-11-29 ENCOUNTER — Ambulatory Visit: Payer: Medicare PPO | Admitting: Physician Assistant

## 2021-11-29 ENCOUNTER — Encounter: Payer: Self-pay | Admitting: Physician Assistant

## 2021-11-29 DIAGNOSIS — M25511 Pain in right shoulder: Secondary | ICD-10-CM | POA: Diagnosis not present

## 2021-11-29 DIAGNOSIS — M25512 Pain in left shoulder: Secondary | ICD-10-CM

## 2021-11-29 MED ORDER — BUPIVACAINE HCL 0.25 % IJ SOLN
2.0000 mL | INTRAMUSCULAR | Status: AC | PRN
  Administered 2021-11-29: 2 mL via INTRA_ARTICULAR

## 2021-11-29 MED ORDER — METHYLPREDNISOLONE ACETATE 40 MG/ML IJ SUSP
40.0000 mg | INTRAMUSCULAR | Status: AC | PRN
  Administered 2021-11-29: 40 mg via INTRA_ARTICULAR

## 2021-11-29 MED ORDER — LIDOCAINE HCL 2 % IJ SOLN
2.0000 mL | INTRAMUSCULAR | Status: AC | PRN
  Administered 2021-11-29: 2 mL

## 2021-11-29 NOTE — Progress Notes (Signed)
Office Visit Note   Patient: Kristina Lee           Date of Birth: 1946-02-09           MRN: [id number] Visit Date: 11/29/2021              Requested by: Bradd Canary, MD [id number] [place],  [place] [address] PCP: Bradd Canary, MD   Assessment & Plan: Visit Diagnoses:  1. Bilateral shoulder pain, unspecified chronicity     Plan: Impression is chronic bilateral shoulder pain.  Although the patient does have a moderate amount of glenohumeral osteoarthritis, feel a lot of her symptoms may be coming from the subacromial space.  I would like to proceed with diagnostic and hopefully therapeutic left shoulder subacromial cortisone injection as the left shoulder is most symptomatic today.  She has a type II diabetic and will follow-up over the next 2 weeks for right shoulder injection if the left shoulder injection helps alleviate her symptoms.  Otherwise, follow-up as needed.  Follow-Up Instructions: Return if symptoms worsen or fail to improve.   Orders:  Orders Placed This Encounter  Procedures   Large Joint Inj: L subacromial bursa   XR Shoulder Right   XR Shoulder Left   No orders of the defined types were placed in this encounter.     Procedures: Large Joint Inj: L subacromial bursa on 11/29/2021 10:52 AM Indications: pain Details: 22 G needle Medications: 2 mL lidocaine 2 %; 2 mL bupivacaine 0.25 %; 40 mg methylPREDNISolone acetate 40 MG/ML Outcome: tolerated well, no immediate complications Patient was prepped and draped in the usual sterile fashion.       Clinical Data: No additional findings.   Subjective: Chief Complaint  Patient presents with   Right Shoulder - Pain   Left Shoulder - Pain    HPI patient is a pleasant 76 year old female who comes in today with bilateral shoulder pain both equally as bad.  Her symptoms began about 3 years ago when she was knocked over by her grandchild and dog.  She has been seeing Dr. Ferdie Ping for  this where her symptoms did improve following an exercise program.  She does not think she has undergone cortisone injection in the past.  Her symptoms remain around the deltoid with occasional radiation to the lateral neck.  Her symptoms are worse when she is picking things up.  She has tried over-the-counter pain medication without relief.  She denies any paresthesias down either upper extremity.  Review of Systems as detailed in HPI.  All others reviewed and are negative.   Objective: Vital Signs: LMP  (LMP Unknown)   Physical Exam well-developed well-nourished female no acute distress.  Alert and oriented x3.  Ortho Exam bilateral shoulder exam reveals full active range of motion in all planes.  She does have pain with abduction and empty can test.  Negative speeds and O'Brien's.  Near full strength throughout.  She is neurovascular intact distally.  Specialty Comments:  No specialty comments available.  Imaging: XR Shoulder Right  Result Date: 11/29/2021 X-rays demonstrate moderate degenerative changes the glenohumeral and [facility] joint  XR Shoulder Left  Result Date: 11/29/2021 X-rays demonstrate moderate degenerative changes the glenohumeral and [facility] joint    PMFS History: Patient Active Problem List   Diagnosis Date Noted   Mild cognitive impairment with memory loss 10/05/2021   Adjustment disorder with depressed mood 03/14/2021   Nuclear sclerotic cataract of left eye 11/09/2020  Post-menopause 09/30/2020   Constipation 09/29/2020   Tinnitus of left ear 09/29/2020   Hyperglycemia 09/29/2020   History of oral lesions 10/09/2019   Right arm pain 07/15/2019   Hair loss 08/10/2018   Hearing loss 08/10/2018   Otitis externa 08/10/2018   Varicose veins of right lower extremity with complications [id number]   Myalgia 02/09/2018   Allergic urticaria 12/23/2017   Myocardial infarction 12/23/2017   Type II diabetes mellitus 12/23/2017   Intrinsic atopic dermatitis 12/23/2017    CAD (coronary artery disease) 09/25/2017   Low back pain 09/25/2017   Gastroesophageal reflux disease with esophagitis 09/04/2017   Hyperlipidemia associated with type 2 diabetes mellitus 12/31/2016   Hypokalemia 10/28/2015   DDD (degenerative disc disease), lumbosacral 09/13/2015   Diverticulosis of large intestine 09/13/2015   Seborrheic dermatitis of scalp 09/13/2015   Chronic kidney disease, stage III (moderate) 05/02/2015   Allergic rhinitis 07/14/2014   Essential hypertension 07/14/2014   History of uterine cancer 07/14/2014   Past Medical History:  Diagnosis Date   Adjustment disorder with depressed mood 03/14/2021   Allergic rhinitis 07/14/2014   Allergic urticaria 12/23/2017   CAD (coronary artery disease) 09/25/2017   Last Assessment & Plan:  History of CAD status post non-STEMI 09/02/2017 at [facility] in [place].  She underwent proximal LAD stenting via a radial approach.  EF was 45 to 50% she is on dual and type of therapy with aspirin and Brilinta.  We will recheck a 2D echo for LV fu   Chronic kidney disease, stage III (moderate) 05/02/2015   DDD (degenerative disc disease), lumbosacral 09/13/2015   Noted during 09/08/15 ed visit (L4-L5 and L5-S1) Noted during 09/08/15 ed visit (L4-L5 and L5-S1)  Formatting of this note might be different from the original. Overview:  Noted during 09/08/15 ed visit (L4-L5 and L5-S1) Formatting of this note might be different from the original. Noted during 09/08/15 ed visit (L4-L5 and L5-S1)   Diverticulosis of large intestine 09/13/2015   Incidentally found, extensive, entire colon found on ED visit on CT scan 09/08/15 Incidentally found, extensive, entire colon found on ED visit on CT scan 09/08/15  Formatting of this note might be different from the original. Overview:  Incidentally found, extensive, entire colon found on ED visit on CT scan 09/08/15 Formatting of this note might be different from the original. Incidentally found, e    Essential hypertension 07/14/2014   Gastroesophageal reflux disease with esophagitis 09/04/2017   Hearing loss 08/10/2018   History of oral lesions 10/09/2019   History of uterine cancer 07/14/2014   s/p complete hysterectomy and B oophorectomy at [facility] no radiation. s/p complete hysterectomy and B oophorectomy at [facility] no radiation.  Formatting of this note might be different from the original. Overview:  s/p complete hysterectomy and B oophorectomy at [facility] no radiation. Formatting of this note might be different from the original. s/p complete hysterectomy and B oophorectomy at [facility] [facility] no radiati   Hyperglycemia 09/29/2020   Hyperlipidemia associated with type 2 diabetes mellitus 12/31/2016   Hypokalemia 10/28/2015   Intrinsic atopic dermatitis 12/23/2017   Low back pain 09/25/2017   Mild cognitive impairment with memory loss 10/05/2021   Myalgia 02/09/2018   Myocardial infarction 12/23/2017   Nuclear sclerotic cataract of left eye 11/09/2020   Otitis externa 08/10/2018   Seborrheic dermatitis of scalp 09/13/2015   Suspected   Tinnitus of left ear 09/29/2020   Type II diabetes mellitus 12/23/2017   Urticaria    Varicose veins of right lower  extremity with complications 05/08/2018    Family History  Problem Relation Age of Onset   Cancer Mother        liver cancer, breast cancer x 2    Alcohol abuse Mother        smoker   Heart disease Father        MI   Alcohol abuse Father        smoker   Depression Sister    Tremor Sister    Dementia Sister    Cancer Brother        pancreatic   Diabetes Maternal Grandmother    Hearing loss Maternal Grandmother    Depression Maternal Grandmother    Healthy Son    Allergic rhinitis Neg Hx    Angioedema Neg Hx    Asthma Neg Hx    Eczema Neg Hx    Immunodeficiency Neg Hx    Urticaria Neg Hx     Past Surgical History:  Procedure Laterality Date   ABDOMINAL HYSTERECTOMY  2006   b/l SPO and TAH, uterine cancer   EYE SURGERY Right 1960    TUBAL LIGATION     Social History   Occupational History   Occupation: Retired    Comment: Runner, broadcasting/film/video  Tobacco Use   Smoking status: Former    Packs/day: 0.50    Years: 24.00    Total pack years: 12.00    Types: Cigarettes    Quit date: 04/17/1987    Years since quitting: 34.6   Smokeless tobacco: Never  Vaping Use   Vaping Use: Never used  Substance and Sexual Activity   Alcohol use: Yes    Comment: occ   Drug use: Not Currently   Sexual activity: Yes

## 2021-12-02 ENCOUNTER — Ambulatory Visit
Admission: RE | Admit: 2021-12-02 | Discharge: 2021-12-02 | Disposition: A | Discharge status: Home / Self Care | Payer: Medicare PPO | Source: Ambulatory Visit | Attending: Physician Assistant | Admitting: Physician Assistant

## 2021-12-02 DIAGNOSIS — F0280 Dementia in other diseases classified elsewhere without behavioral disturbance: Secondary | ICD-10-CM

## 2021-12-05 ENCOUNTER — Telehealth: Payer: Self-pay

## 2021-12-05 NOTE — Telephone Encounter (Signed)
Mri brain wo contrast was ordered for 08/192023 and scheduled. Note patient cancelled this appt. Fyi

## 2021-12-10 ENCOUNTER — Other Ambulatory Visit: Payer: Self-pay | Admitting: Physician Assistant

## 2021-12-11 ENCOUNTER — Ambulatory Visit: Payer: Medicare PPO | Admitting: Family Medicine

## 2021-12-11 VITALS — BP 140/78 | HR 57 | Temp 97.7°F | Resp 16 | Ht 62.0 in | Wt 138.6 lb

## 2021-12-11 DIAGNOSIS — E1169 Type 2 diabetes mellitus with other specified complication: Secondary | ICD-10-CM | POA: Diagnosis not present

## 2021-12-11 DIAGNOSIS — J301 Allergic rhinitis due to pollen: Secondary | ICD-10-CM | POA: Diagnosis not present

## 2021-12-11 DIAGNOSIS — I10 Essential (primary) hypertension: Secondary | ICD-10-CM

## 2021-12-11 DIAGNOSIS — K5900 Constipation, unspecified: Secondary | ICD-10-CM | POA: Diagnosis not present

## 2021-12-11 DIAGNOSIS — M791 Myalgia, unspecified site: Secondary | ICD-10-CM | POA: Diagnosis not present

## 2021-12-11 DIAGNOSIS — E785 Hyperlipidemia, unspecified: Secondary | ICD-10-CM

## 2021-12-11 DIAGNOSIS — G3184 Mild cognitive impairment, so stated: Secondary | ICD-10-CM

## 2021-12-11 DIAGNOSIS — L309 Dermatitis, unspecified: Secondary | ICD-10-CM

## 2021-12-11 DIAGNOSIS — L659 Nonscarring hair loss, unspecified: Secondary | ICD-10-CM | POA: Diagnosis not present

## 2021-12-11 MED ORDER — FLUTICASONE PROPIONATE 50 MCG/ACT NA SUSP: 2.0000 | Freq: Every day | NASAL | 1 refills | Status: DC | PRN

## 2021-12-11 MED ORDER — BETAMETHASONE DIPROPIONATE AUG 0.05 % EX CREA
TOPICAL_CREAM | Freq: Every day | CUTANEOUS | 1 refills | Status: DC | PRN
Start: 2021-12-11 — End: 2022-03-21

## 2021-12-11 MED ORDER — ACYCLOVIR 5 % EX CREA
1.0000 | TOPICAL_CREAM | CUTANEOUS | 0 refills | Status: DC
Start: 2021-12-11 — End: 2021-12-12

## 2021-12-11 MED ORDER — TRIAMCINOLONE ACETONIDE 0.025 % EX OINT: 1.0000 | TOPICAL_OINTMENT | Freq: Two times a day (BID) | CUTANEOUS | 0 refills | Status: DC | PRN

## 2021-12-11 MED ORDER — CLOPIDOGREL BISULFATE 75 MG PO TABS: 75.0000 mg | ORAL_TABLET | Freq: Every day | ORAL | 1 refills | Status: DC

## 2021-12-11 NOTE — Patient Instructions (Addendum)
RSV (Respiratory Syncitial Virus) vaccine at pharmacy once  Flu shot in September  COVID booster in October  Add Benefiber to [place] Encouraged increased hydration and fiber in diet. Daily probiotics. If bowels not moving can use MilkOfMagnesia 2 tbls po in 4 oz of warm prune juice by mouth every 2-3 days. If no results then repeat in 4 hours with  Dulcolax suppository pr, may repeat again in 4 more hours as needed. Seek care if symptoms worsen. Consider daily Miralax and/or Dulcolax if symptoms persist.     Alzheimer's Disease Alzheimer's disease is a brain disease that affects memory, thinking, language, and behavior. People with Alzheimer's disease lose mental abilities, and the disease gets worse over time. Alzheimer's disease is a form of dementia. What are the causes? This condition develops when a protein called beta-amyloid forms deposits in the brain. It is not known what causes these deposits to form. Alzheimer's disease may also be caused by a gene mutation that is inherited from one parent or both parents. A gene mutation is a harmful change in a gene. Not everyone who inherits the genetic mutation will get the disease. What increases the risk? You are more likely to develop this condition if you: Are older than age 78. Are female. Have any of these medical conditions: High blood pressure. Diabetes. Heart or blood vessel disease. Smoke. Have obesity. Have had a brain injury. Have had a stroke. Have a family history of dementia. What are the signs or symptoms? Symptoms of this condition may happen in three stages, which often overlap. Early stage In this stage, you may continue to be independent. You may still be able to drive, work, and be social. Symptoms in this stage include: Minor memory problems, such as forgetting a name, words, or what you did recently. Difficulty with: Paying attention. Communicating. Doing familiar tasks. Problem solving or doing  calculations. Following instructions. Learning new things. Anxiety. Social withdrawal. Loss of motivation. Moderate stage In this stage, you will start to need care. Symptoms in this stage include: Difficulty with expressing thoughts. Memory loss that affects daily life. This can include forgetting: Recent events that have happened. If you have taken medicines or eaten. Familiar places. You may get lost while walking or driving. To pay bills or manage finances. Personal hygiene such as bathing or using the bathroom. Confusion about where you are or what time it is. Difficulty in judging distance. Changes in personality, mood, and behavior. You may be moody, irritable, angry, frustrated, fearful, anxious, or suspicious. Poor reasoning and judgment. Delusions or hallucinations. Changes in sleep patterns. Severe stage In the final stage, you will need help with your personal care and daily activities. Symptoms in this stage include: Worsening memory loss. Personality changes. Loss of awareness of your surroundings. Changes in physical abilities, including the ability to walk, sit, and swallow. Difficulty in communicating. Inability to control your bladder and bowels. Increasing confusion. Increasing behavior changes. How is this diagnosed?  This condition is diagnosed by a health care provider who specializes in diseases of the nervous system (neurologist) or one who specializes in care of the elderly (geriatrician or geriatric psychiatrist). Other causes of dementia may also be ruled out. Your health care provider will talk with you and your family, friends, or caregivers about your history and symptoms. A thorough medical history will be taken, and you will have a physical exam and tests. Tests may include: Lab tests, such as blood or urine tests. Imaging tests, such as a CT  scan, a PET scan, or an MRI. A lumbar puncture. This test involves removing and testing a small amount of  the fluid that surrounds the brain and spinal cord. An electroencephalogram (EEG). In this test, small metal discs are used to measure electrical activity in the brain. Memory tests, cognitive tests, and neuropsychological tests. These tests evaluate brain function. Genetic testing. This may be done if you have early onset of the disease (before age 21) or if other family members have the disease. How is this treated? At this time, there is no treatment to cure Alzheimer's disease or stop it from getting worse. The goals of treatment are: To manage behavioral changes. To provide you with a safe environment. To help manage daily life for you and your caregivers. The following treatment options are available: Medicines. Medicines may help the memory work better and manage behavioral symptoms. Cognitive therapy. Cognitive therapy provides you with education, support, and memory aids. It is most helpful in the early stages of the condition. Counseling or spiritual guidance. It is normal to have a lot of feelings, including anger, relief, fear, and isolation. Counseling and guidance can help you deal with these feelings. Caregiving. This involves having caregivers help you with your daily activities. Family support groups. These provide education, emotional support, and information about community resources to family members who are taking care of you. Follow these instructions at home:  Medicines Take over-the-counter and prescription medicines only as told by your health care provider. Use a pill organizer or pill reminder to help you manage your medicines. Avoid taking medicines that can affect thinking, such as pain medicines or sleeping medicines. Lifestyle Make healthy lifestyle choices: Be physically active as told by your health care provider. Regular exercise may help improve symptoms. Do not use any products that contain nicotine or tobacco, such as cigarettes, e-cigarettes, and chewing  tobacco. If you need help quitting, ask your health care provider. Do not drink alcohol. Eat a healthy diet. Practice stress-management techniques when you get stressed. Stay social. Drink enough fluid to keep your urine pale yellow. Make sure to get quality sleep. Avoid taking long naps during the day. Take short naps of 30 minutes or less if needed. Keep your sleeping area dark and cool. Avoid exercising during the few hours before you go to bed. Avoid caffeine products in the afternoon and evening. General instructions Work with your health care provider to determine what you need help with and what your safety needs are. If you were given a bracelet that identifies you as a person with memory loss or tracks your location, make sure to wear it at all times. Talk with your health care provider about whether it is safe for you to drive. Work with your family to make important decisions, such as advance directives, medical power of attorney, or a living will. Keep all follow-up visits. This is important. Where to find more information The Alzheimer's Association: Call the 24-hour helpline at (781) 270-2688, or visit Limit[employer]hu Contact a health care provider if: You have nausea, vomiting, or trouble with eating related to a medicine. You have worsening mood or behavior changes, such as depression, anxiety, or hallucinations. You or your family members become concerned for your safety. Get help right away if: You become less responsive or are difficult to wake up. Your memory suddenly gets worse. You feel that you want to harm yourself. If you ever feel like you may hurt yourself or others, or have thoughts about taking your own  life, get help right away. Go to your nearest emergency department or: Call your local emergency services (911 in the [place] Call a suicide crisis helpline, such as the National Suicide Prevention Lifeline at (606)681-9895 or 988 in the [place] This is open 24 hours  a day in the [place] Text the Crisis Text Line at 971-792-2763 (in the [place] Summary Alzheimer's disease is a brain disease that affects memory, thinking, language, and behavior. Alzheimer's disease is a form of dementia. This condition is diagnosed by a specialist in diseases of the nervous system (neurologist) or one who specializes in care of the elderly. At this time, there is no treatment to cure Alzheimer's disease or stop it from getting worse. The goal of treatment is to help you manage any symptoms. Work with your family to make important decisions, such as advance directives, medical power of attorney, or a living will. This information is not intended to replace advice given to you by your health care provider. Make sure you discuss any questions you have with your health care provider. Document Revised: 10/26/2020 Document Reviewed: 07/20/2019 Elsevier Patient Education  2023 [employer].

## 2021-12-12 ENCOUNTER — Telehealth: Payer: Self-pay

## 2021-12-12 MED ORDER — ACYCLOVIR 5 % EX CREA: 1.0000 | TOPICAL_CREAM | CUTANEOUS | 0 refills | Status: DC

## 2021-12-12 NOTE — Telephone Encounter (Signed)
Pt's plan allows 5g of acyclovir 5% cream per 30 days. Rx resent for 5g.

## 2021-12-13 DIAGNOSIS — L309 Dermatitis, unspecified: Secondary | ICD-10-CM | POA: Insufficient documentation

## 2021-12-13 DIAGNOSIS — L710 Perioral dermatitis: Secondary | ICD-10-CM | POA: Insufficient documentation

## 2021-12-13 NOTE — Assessment & Plan Note (Signed)
Encouraged increased hydration and fiber in diet. Daily probiotics. If bowels not moving can use MOM 2 tbls po in 4 oz of warm prune juice by mouth every 2-3 days. If no results then repeat in 4 hours with  Dulcolax suppository pr, may repeat again in 4 more hours as needed. Seek care if symptoms worsen. Consider daily Miralax and/or Dulcolax if symptoms persist.  

## 2021-12-13 NOTE — Assessment & Plan Note (Signed)
Well controlled, no changes to meds. Encouraged heart healthy diet such as the DASH diet and exercise as tolerated.  °

## 2021-12-13 NOTE — Progress Notes (Signed)
Subjective:    Patient ID: Kristina Lee, female    DOB: April 22, 1945, 76 y.o.   MRN: [id number]  Chief Complaint  Patient presents with   Follow-up    Here for follow up    HPI Patient is in today for follow up on chronic medical concerns. No recent febrile illness or acute hospitalizations. She is accompanied by her husband. They report her memory loss may have stabilized some since starting Namenda. She has tolerated the med and deneis any concerning side effects. She is eating well. She is struggling with forgetfulness and some anxiety but it has been manageable. Her husband serves as her caregiver. She has had some intermittent constipation. No bloody or tarry stool. Denies CP/palp/SOB/HA/congestion/fevers or GU c/o. Taking meds as prescribed   Past Medical History:  Diagnosis Date   Adjustment disorder with depressed mood 03/14/2021   Allergic rhinitis 07/14/2014   Allergic urticaria 12/23/2017   CAD (coronary artery disease) 09/25/2017   Last Assessment & Plan:  History of CAD status post non-STEMI 09/02/2017 at [facility] in [place].  She underwent proximal LAD stenting via a radial approach.  EF was 45 to 50% she is on dual and type of therapy with aspirin and Brilinta.  We will recheck a 2D echo for LV fu   Chronic kidney disease, stage III (moderate) 05/02/2015   DDD (degenerative disc disease), lumbosacral 09/13/2015   Noted during 09/08/15 ed visit (L4-L5 and L5-S1) Noted during 09/08/15 ed visit (L4-L5 and L5-S1)  Formatting of this note might be different from the original. Overview:  Noted during 09/08/15 ed visit (L4-L5 and L5-S1) Formatting of this note might be different from the original. Noted during 09/08/15 ed visit (L4-L5 and L5-S1)   Diverticulosis of large intestine 09/13/2015   Incidentally found, extensive, entire colon found on ED visit on CT scan 09/08/15 Incidentally found, extensive, entire colon found on ED visit on CT scan 09/08/15  Formatting of this  note might be different from the original. Overview:  Incidentally found, extensive, entire colon found on ED visit on CT scan 09/08/15 Formatting of this note might be different from the original. Incidentally found, e   Essential hypertension 07/14/2014   Gastroesophageal reflux disease with esophagitis 09/04/2017   Hearing loss 08/10/2018   History of oral lesions 10/09/2019   History of uterine cancer 07/14/2014   s/p complete hysterectomy and B oophorectomy at [facility] no radiation. s/p complete hysterectomy and B oophorectomy at [facility] no radiation.  Formatting of this note might be different from the original. Overview:  s/p complete hysterectomy and B oophorectomy at [facility] no radiation. Formatting of this note might be different from the original. s/p complete hysterectomy and B oophorectomy at [facility] no radiati   Hyperglycemia 09/29/2020   Hyperlipidemia associated with type 2 diabetes mellitus 12/31/2016   Hypokalemia 10/28/2015   Intrinsic atopic dermatitis 12/23/2017   Low back pain 09/25/2017   Mild cognitive impairment with memory loss 10/05/2021   Myalgia 02/09/2018   Myocardial infarction 12/23/2017   Nuclear sclerotic cataract of left eye 11/09/2020   Otitis externa 08/10/2018   Seborrheic dermatitis of scalp 09/13/2015   Suspected   Tinnitus of left ear 09/29/2020   Type II diabetes mellitus 12/23/2017   Urticaria    Varicose veins of right lower extremity with complications 05/08/2018    Past Surgical History:  Procedure Laterality Date   ABDOMINAL HYSTERECTOMY  2006   b/l SPO and TAH, uterine cancer   EYE SURGERY Right 1960  TUBAL LIGATION      Family History  Problem Relation Age of Onset   Cancer Mother        liver cancer, breast cancer x 2    Alcohol abuse Mother        smoker   Heart disease Father        MI   Alcohol abuse Father        smoker   Depression Sister    Tremor Sister    Dementia Sister    Cancer Brother        pancreatic   Diabetes  Maternal Grandmother    Hearing loss Maternal Grandmother    Depression Maternal Grandmother    Healthy Son    Allergic rhinitis Neg Hx    Angioedema Neg Hx    Asthma Neg Hx    Eczema Neg Hx    Immunodeficiency Neg Hx    Urticaria Neg Hx     Social History   Socioeconomic History   Marital status: Married    Spouse name: Not on file   Number of children: 3   Years of education: 18   Highest education level: Master's degree (e.g., MA, MS, MEng, MEd, MSW, MBA)  Occupational History   Occupation: Retired    Comment: Runner, broadcasting/film/video  Tobacco Use   Smoking status: Former    Packs/day: 0.50    Years: 24.00    Total pack years: 12.00    Types: Cigarettes    Quit date: 04/17/1987    Years since quitting: 34.6   Smokeless tobacco: Never  Vaping Use   Vaping Use: Never used  Substance and Sexual Activity   Alcohol use: Yes    Comment: occ   Drug use: Not Currently   Sexual activity: Yes  Other Topics Concern   Not on file  Social History Narrative   Right handed   Drinks caffeine   2 story home   Social Determinants of Health   Financial Resource Strain: Low Risk  (09/29/2020)   Overall Financial Resource Strain (CARDIA)    Difficulty of Paying Living Expenses: Not hard at all  Food Insecurity: No Food Insecurity (09/29/2020)   Hunger Vital Sign    Worried About Running Out of Food in the Last Year: Never true    Ran Out of Food in the Last Year: Never true  Transportation Needs: No Transportation Needs (09/29/2020)   PRAPARE - Administrator, Civil Service (Medical): No    Lack of Transportation (Non-Medical): No  Physical Activity: Inactive (09/29/2020)   Exercise Vital Sign    Days of Exercise per Week: 0 days    Minutes of Exercise per Session: 0 min  Stress: Stress Concern Present (09/29/2020)   [employer] of Occupational Health - Occupational Stress Questionnaire    Feeling of Stress : To some extent  Social Connections: Moderately Isolated (09/29/2020)    Social Connection and Isolation Panel [NHANES]    Frequency of Communication with Friends and Family: More than three times a week    Frequency of Social Gatherings with Friends and Family: Once a week    Attends Religious Services: Never    Database administrator or Organizations: No    Attends Banker Meetings: Never    Marital Status: Married  Catering manager Violence: Not At Risk (09/29/2020)   Humiliation, Afraid, Rape, and Kick questionnaire    Fear of Current or Ex-Partner: No    Emotionally Abused: No  Physically Abused: No    Sexually Abused: No    Outpatient Medications Prior to Visit  Medication Sig Dispense Refill   Acetaminophen (TYLENOL PO) Take by mouth as needed.     aspirin EC 81 MG tablet Take 81 mg by mouth daily.     atorvastatin (LIPITOR) 80 MG tablet TAKE 1 TABLET BY MOUTH EVERY DAY 90 tablet 1   Calcium Carbonate-Vit D-Min (CALCIUM 600+D PLUS MINERALS) 600-400 MG-UNIT TABS Take 2 tablets by mouth daily.      diclofenac Sodium (VOLTAREN) 1 % GEL Apply 4 g topically 4 (four) times daily as needed. 300 g 2   lisinopril (ZESTRIL) 20 MG tablet Take 1 tablet (20 mg total) by mouth daily. 90 tablet 1   memantine (NAMENDA) 10 MG tablet TAKE 1 TABLET (10 MG AT NIGHT) FOR 2 WEEKS, THEN INCREASE TO 1 TABLET (10 MG) TWICE A DAY 180 tablet 2   OVER THE COUNTER MEDICATION as needed.     polyethylene glycol (MIRALAX / GLYCOLAX) 17 g packet Take 17 g by mouth as needed.     augmented betamethasone dipropionate (DIPROLENE-AF) 0.05 % cream Apply topically daily.     fluticasone (FLONASE) 50 MCG/ACT nasal spray Place 2 sprays into both nostrils daily. 48 g 1   No facility-administered medications prior to visit.    Allergies  Allergen Reactions   Amoxicillin Anaphylaxis   Hydroxyzine Anaphylaxis   Sulfa Antibiotics Swelling    Rash      Review of Systems  Constitutional:  Positive for malaise/fatigue. Negative for fever.  HENT:  Negative for  congestion.   Eyes:  Negative for blurred vision.  Respiratory:  Negative for shortness of breath.   Cardiovascular:  Negative for chest pain, palpitations and leg swelling.  Gastrointestinal:  Positive for constipation. Negative for abdominal pain, blood in stool and nausea.  Genitourinary:  Negative for dysuria and frequency.  Musculoskeletal:  Negative for falls.  Skin:  Negative for rash.  Neurological:  Negative for dizziness, loss of consciousness and headaches.  Endo/Heme/Allergies:  Negative for environmental allergies.  Psychiatric/Behavioral:  Positive for memory loss. Negative for depression. The patient is nervous/anxious.        Objective:    Physical Exam Constitutional:      General: She is not in acute distress.    Appearance: She is well-developed.  HENT:     Head: Normocephalic and atraumatic.  Eyes:     Conjunctiva/sclera: Conjunctivae normal.  Neck:     Thyroid: No thyromegaly.  Cardiovascular:     Rate and Rhythm: Normal rate and regular rhythm.     Heart sounds: Normal heart sounds. No murmur heard. Pulmonary:     Effort: Pulmonary effort is normal. No respiratory distress.     Breath sounds: Normal breath sounds.  Abdominal:     General: Bowel sounds are normal. There is no distension.     Palpations: Abdomen is soft. There is no mass.     Tenderness: There is no abdominal tenderness.  Musculoskeletal:     Cervical back: Neck supple.  Lymphadenopathy:     Cervical: No cervical adenopathy.  Skin:    General: Skin is warm and dry.  Neurological:     Mental Status: She is alert and oriented to person, place, and time.  Psychiatric:        Behavior: Behavior normal.     BP (!) 140/78 (BP Location: Right Arm, Patient Position: Sitting, Cuff Size: Normal)   Pulse (!) 57  Temp 97.7 F (36.5 C) (Oral)   Resp 16   Ht 5\' 2"  (1.575 m)   Wt 138 lb 9.6 oz (62.9 kg)   LMP  (LMP Unknown)   SpO2 97%   BMI 25.35 kg/m  Wt Readings from Last 3  Encounters:  12/11/21 138 lb 9.6 oz (62.9 kg)  11/16/21 140 lb (63.5 kg)  08/01/21 139 lb (63 kg)    Diabetic Foot Exam - Simple   No data filed    Lab Results  Component Value Date   WBC 9.4 03/14/2021   HGB 14.2 03/14/2021   HCT 41.5 03/14/2021   PLT 206.0 03/14/2021   GLUCOSE 173 (H) 03/14/2021   CHOL 111 03/14/2021   TRIG 148.0 03/14/2021   HDL 42.30 03/14/2021   LDLCALC 39 03/14/2021   ALT 15 03/14/2021   AST 20 03/14/2021   NA 141 03/14/2021   K 3.6 03/14/2021   CL 105 03/14/2021   CREATININE 0.86 03/14/2021   BUN 20 03/14/2021   CO2 27 03/14/2021   TSH 0.89 03/14/2021   HGBA1C 6.3 03/14/2021    Lab Results  Component Value Date   TSH 0.89 03/14/2021   Lab Results  Component Value Date   WBC 9.4 03/14/2021   HGB 14.2 03/14/2021   HCT 41.5 03/14/2021   MCV 86.1 03/14/2021   PLT 206.0 03/14/2021   Lab Results  Component Value Date   NA 141 03/14/2021   K 3.6 03/14/2021   CO2 27 03/14/2021   GLUCOSE 173 (H) 03/14/2021   BUN 20 03/14/2021   CREATININE 0.86 03/14/2021   BILITOT 1.6 (H) 03/14/2021   ALKPHOS 59 03/14/2021   AST 20 03/14/2021   ALT 15 03/14/2021   PROT 6.6 03/14/2021   ALBUMIN 4.3 03/14/2021   CALCIUM 9.4 03/14/2021   ANIONGAP 11 03/12/2018   GFR 66.04 03/14/2021   Lab Results  Component Value Date   CHOL 111 03/14/2021   Lab Results  Component Value Date   HDL 42.30 03/14/2021   Lab Results  Component Value Date   LDLCALC 39 03/14/2021   Lab Results  Component Value Date   TRIG 148.0 03/14/2021   Lab Results  Component Value Date   CHOLHDL 3 03/14/2021   Lab Results  Component Value Date   HGBA1C 6.3 03/14/2021       Assessment & Plan:   Problem List Items Addressed This Visit     Allergic rhinitis    Hydrate well, plain mucinex, antihistamines bid and nasal steroids      Relevant Medications   fluticasone (FLONASE) 50 MCG/ACT nasal spray   Essential hypertension    Well controlled, no changes to meds.  Encouraged heart healthy diet such as the DASH diet and exercise as tolerated.       Relevant Orders   CBC   Comprehensive metabolic panel   TSH   Hyperlipidemia associated with type 2 diabetes mellitus - Primary    Encourage heart healthy diet such as MIND or DASH diet, increase exercise, avoid trans fats, simple carbohydrates and processed foods, consider a krill or fish or flaxseed oil cap daily. Tolerating Atorvastatin      Relevant Orders   Lipid panel   Type II diabetes mellitus    hgba1c acceptable, minimize simple carbs. Increase exercise as tolerated. Continue current meds      Relevant Orders   Hemoglobin A1c   Myalgia   Relevant Orders   Comprehensive metabolic panel   Hair loss  Balanced diet, hydrate well. Biotin, fatty acid caps, MVI       Constipation    Encouraged increased hydration and fiber in diet. Daily probiotics. If bowels not moving can use MOM 2 tbls po in 4 oz of warm prune juice by mouth every 2-3 days. If no results then repeat in 4 hours with  Dulcolax suppository pr, may repeat again in 4 more hours as needed. Seek care if symptoms worsen. Consider daily Miralax and/or Dulcolax if symptoms persist.       Mild cognitive impairment with memory loss    Is following with neurology and has been given the diagnosis of Alzheimers. Her husband is with her today and is her care giver. She is tolerating Namenda but is progressing some      Dermatitis    Given refill on Betamethasone to use sparingly prn.       I have changed Bosie Clos A. Ramson "Judy"'s augmented betamethasone dipropionate, clopidogrel, and fluticasone. I am also having her start on triamcinolone. Additionally, I am having her maintain her aspirin EC, Calcium 600+D Plus Minerals, Acetaminophen (TYLENOL PO), OVER THE COUNTER MEDICATION, polyethylene glycol, diclofenac Sodium, atorvastatin, lisinopril, and memantine.  Meds ordered this encounter  Medications   triamcinolone (KENALOG) 0.025  % ointment    Sig: Apply 1 Application topically 2 (two) times daily as needed. Rash on face    Dispense:  30 g    Refill:  0   DISCONTD: acyclovir cream (ZOVIRAX) 5 %    Sig: Apply 1 Application topically every 3 (three) hours. As needed for scabbing at corners of mouth, cold sores    Dispense:  15 g    Refill:  0   augmented betamethasone dipropionate (DIPROLENE-AF) 0.05 % cream    Sig: Apply topically daily as needed.    Dispense:  50 g    Refill:  1   clopidogrel (PLAVIX) 75 MG tablet    Sig: Take 1 tablet (75 mg total) by mouth daily.    Dispense:  90 tablet    Refill:  1   fluticasone (FLONASE) 50 MCG/ACT nasal spray    Sig: Place 2 sprays into both nostrils daily as needed for allergies or rhinitis.    Dispense:  48 g    Refill:  1     Danise Edge, MD

## 2021-12-13 NOTE — Assessment & Plan Note (Signed)
Hydrate well, plain mucinex, antihistamines bid and nasal steroids

## 2021-12-13 NOTE — Assessment & Plan Note (Signed)
hgba1c acceptable, minimize simple carbs. Increase exercise as tolerated. Continue current meds 

## 2021-12-13 NOTE — Assessment & Plan Note (Signed)
Encourage heart healthy diet such as MIND or DASH diet, increase exercise, avoid trans fats, simple carbohydrates and processed foods, consider a krill or fish or flaxseed oil cap daily. Tolerating Atorvastatin 

## 2021-12-13 NOTE — Assessment & Plan Note (Signed)
Is following with neurology and has been given the diagnosis of Alzheimers. Her husband is with her today and is her care giver. She is tolerating Namenda but is progressing some

## 2021-12-13 NOTE — Assessment & Plan Note (Signed)
Balanced diet, hydrate well. Biotin, fatty acid caps, MVI

## 2021-12-13 NOTE — Assessment & Plan Note (Signed)
Given refill on Betamethasone to use sparingly prn.

## 2021-12-28 ENCOUNTER — Other Ambulatory Visit (INDEPENDENT_AMBULATORY_CARE_PROVIDER_SITE_OTHER): Payer: Medicare PPO

## 2021-12-28 DIAGNOSIS — E1169 Type 2 diabetes mellitus with other specified complication: Secondary | ICD-10-CM | POA: Diagnosis not present

## 2021-12-28 DIAGNOSIS — I10 Essential (primary) hypertension: Secondary | ICD-10-CM

## 2021-12-28 DIAGNOSIS — M791 Myalgia, unspecified site: Secondary | ICD-10-CM | POA: Diagnosis not present

## 2021-12-28 DIAGNOSIS — E785 Hyperlipidemia, unspecified: Secondary | ICD-10-CM

## 2021-12-28 LAB — COMPREHENSIVE METABOLIC PANEL
ALT: 17 U/L (ref 0–35)
AST: 19 U/L (ref 0–37)
Albumin: 4.1 g/dL (ref 3.5–5.2)
Alkaline Phosphatase: 66 U/L (ref 39–117)
BUN: 21 mg/dL (ref 6–23)
CO2: 29 mEq/L (ref 19–32)
Calcium: 9.8 mg/dL (ref 8.4–10.5)
Chloride: 104 mEq/L (ref 96–112)
Creatinine, Ser: 1 mg/dL (ref 0.40–1.20)
GFR: 54.8 mL/min — ABNORMAL LOW (ref >60.00)
Glucose, Bld: 120 mg/dL — ABNORMAL HIGH (ref 70–99)
Potassium: 4.1 mEq/L (ref 3.5–5.1)
Sodium: 143 mEq/L (ref 135–145)
Total Bilirubin: 1.6 mg/dL — ABNORMAL HIGH (ref 0.2–1.2)
Total Protein: 6.9 g/dL (ref 6.0–8.3)

## 2021-12-28 LAB — LIPID PANEL
Cholesterol: 115 mg/dL (ref 0–200)
HDL: 49.8 mg/dL (ref >39.00)
LDL Cholesterol: 48 mg/dL (ref 0–99)
NonHDL: 65.43
Total CHOL/HDL Ratio: 2
Triglycerides: 87 mg/dL (ref 0.0–149.0)
VLDL: 17.4 mg/dL (ref 0.0–40.0)

## 2021-12-28 LAB — CBC
HCT: 45.4 % (ref 36.0–46.0)
Hemoglobin: 15.2 g/dL — ABNORMAL HIGH (ref 12.0–15.0)
MCHC: 33.5 g/dL (ref 30.0–36.0)
MCV: 89.1 fl (ref 78.0–100.0)
Platelets: 185 10*3/uL (ref 150.0–400.0)
RBC: 5.1 Mil/uL (ref 3.87–5.11)
RDW: 13.3 % (ref 11.5–15.5)
WBC: 7.7 10*3/uL (ref 4.0–10.5)

## 2021-12-28 LAB — HEMOGLOBIN A1C: Hgb A1c MFr Bld: 6.7 % — ABNORMAL HIGH (ref 4.6–6.5)

## 2021-12-28 LAB — TSH: TSH: 1.36 u[IU]/mL (ref 0.35–5.50)

## 2021-12-29 IMAGING — MG MM DIGITAL SCREENING BILAT W/ TOMO AND CAD
8 series · 8 of 24 positions shown · non-contrast
Comparison: Previous exam(s).

CLINICAL DATA: Screening.

EXAM:
DIGITAL SCREENING BILATERAL MAMMOGRAM WITH TOMOSYNTHESIS AND CAD
TECHNIQUE: Bilateral screening digital craniocaudal and mediolateral oblique
mammograms were obtained. Bilateral screening digital breast
tomosynthesis was performed. The images were evaluated with
computer-aided detection.

[R CC synth-2D]
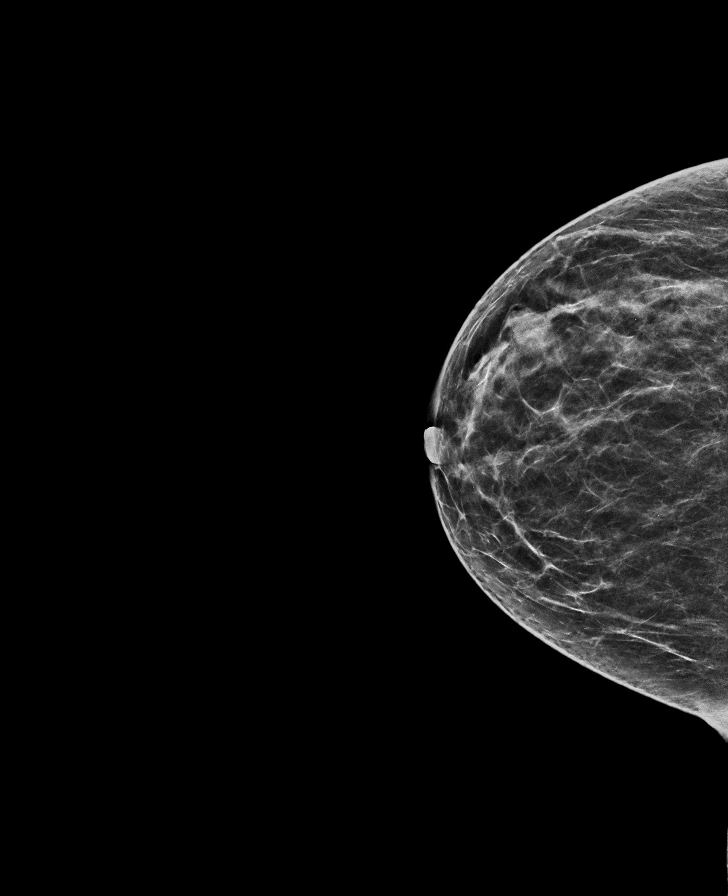

[L MLO synth-2D]
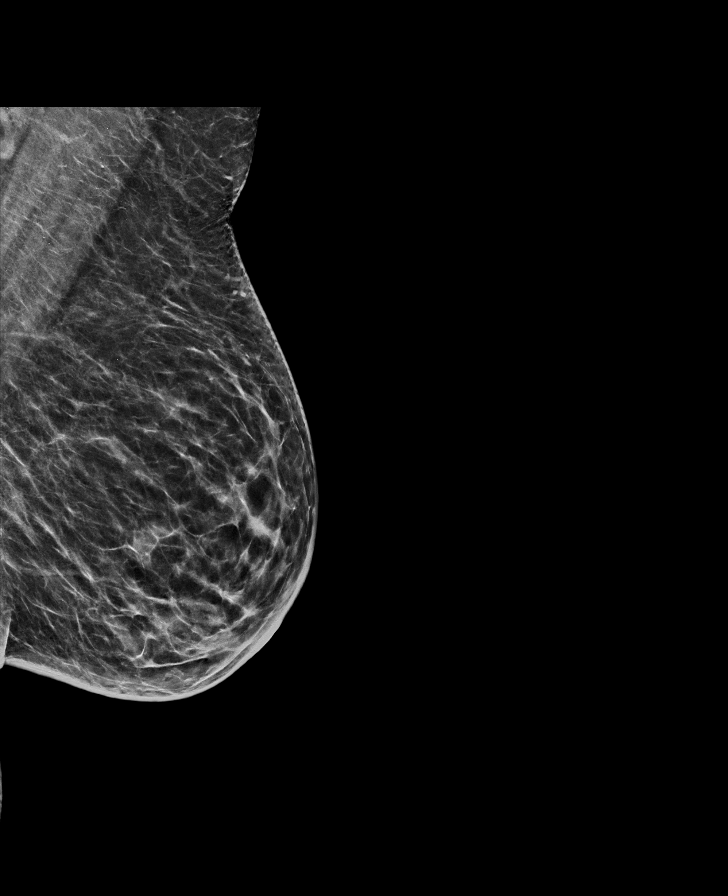

[L CC synth-2D]
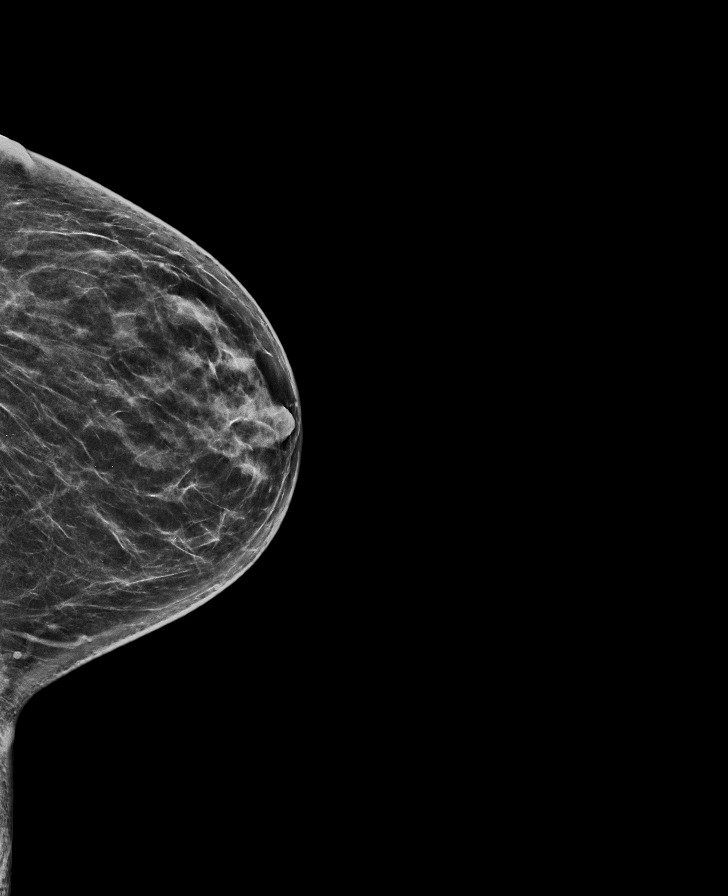

[R MLO synth-2D]
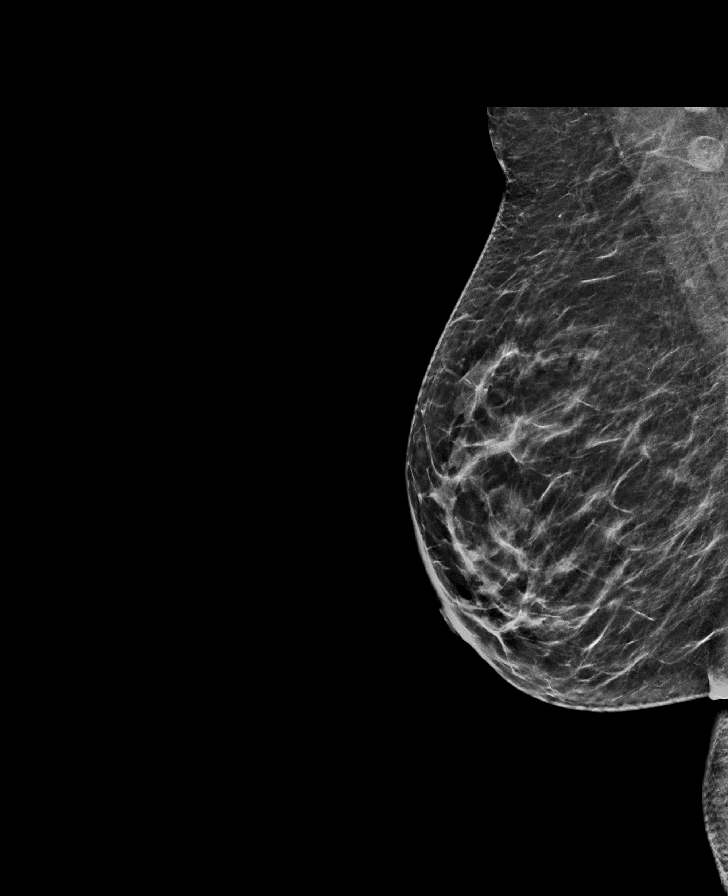

[R CC tomo · tomo slice 30/59.0]
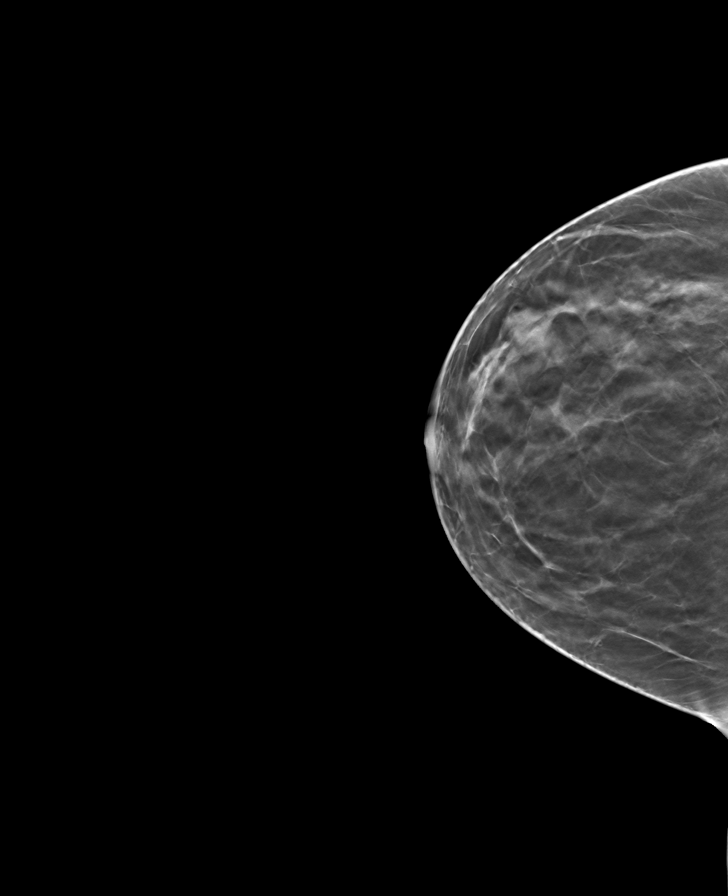

[L MLO tomo · tomo slice 33/65.0]
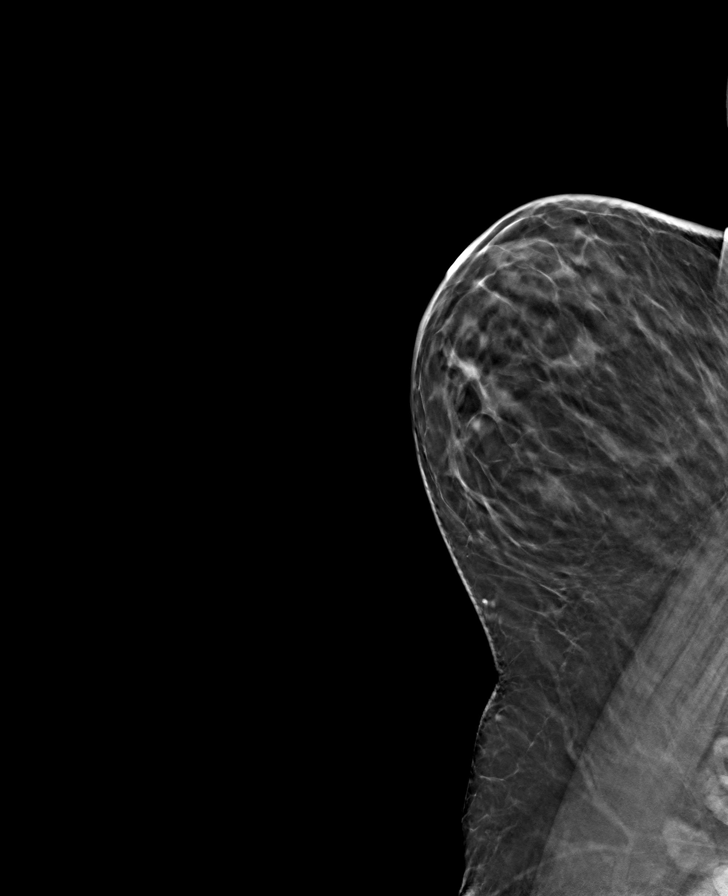

[R MLO tomo · tomo slice 31/61.0]
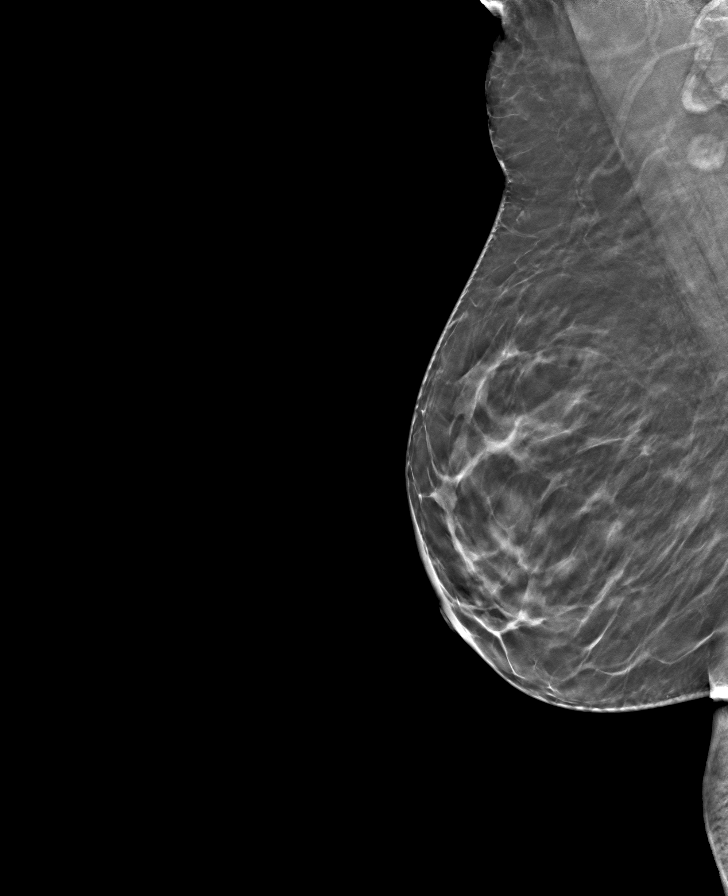

[L CC tomo · tomo slice 32/63.0]
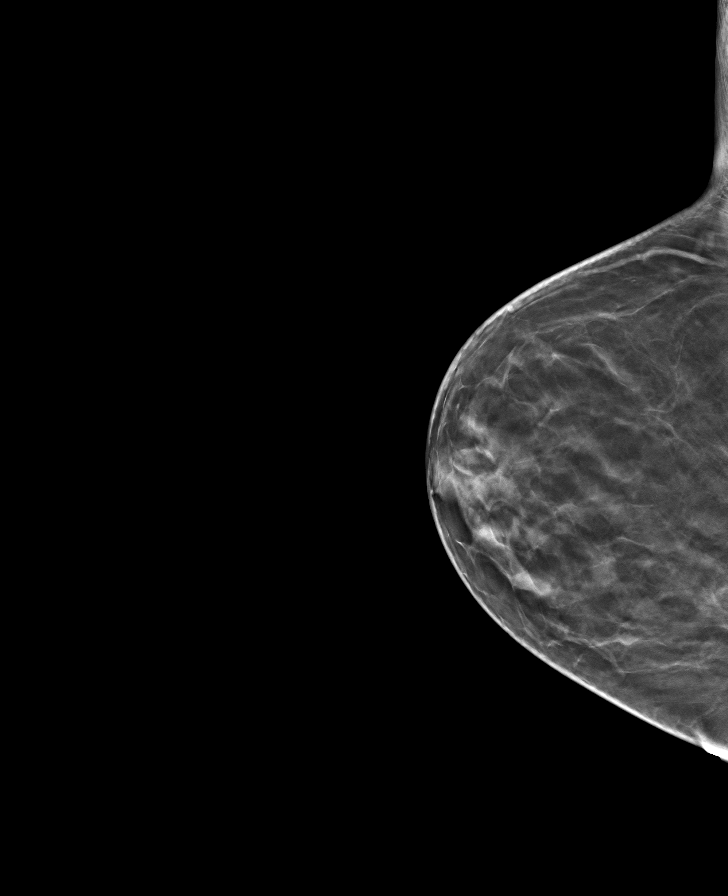

[8 of 24 positions shown; findings below may reference images not displayed]

ACR Breast Density Category c: The breast tissue is heterogeneously
dense, which may obscure small masses.
FINDINGS: There are no findings suspicious for malignancy.
IMPRESSION: No mammographic evidence of malignancy. A result letter of this
screening mammogram will be mailed directly to the patient.

RECOMMENDATION:
Screening mammogram in one year. (Code:Q3-W-BC3)

BI-RADS CATEGORY  1: Negative.

## 2022-01-03 ENCOUNTER — Telehealth: Payer: Self-pay | Admitting: Family Medicine

## 2022-01-03 ENCOUNTER — Other Ambulatory Visit: Payer: Self-pay

## 2022-01-03 MED ORDER — MOLNUPIRAVIR EUA 200MG CAPSULE: 4.0000 | ORAL_CAPSULE | Freq: Two times a day (BID) | ORAL | 0 refills | Status: AC

## 2022-01-03 NOTE — Telephone Encounter (Signed)
Called pt Lvm to call our office back  Regarding pt call

## 2022-01-03 NOTE — Telephone Encounter (Signed)
Pt called back and was advised, the Rx  molnupiravir was sent  Advised the pt if symptoms worsen to go ER

## 2022-01-03 NOTE — Telephone Encounter (Signed)
Pt started having cough on Monday was initially productive but not any longer.  Glands in neck are swollen and sore. Throat is sore and scratchy. Denies sob or chest pain. Pt tested positive for COVID this morning.  Please advise if Rx can be sent to pharmacy? Pt also states pharmacist recommended delsym for her cough. Would that be ok to take in addition to any medication that may be prescribed?

## 2022-01-03 NOTE — Telephone Encounter (Signed)
We need to know symptoms pt is having and date of symptom onset. Left message to return my call. Also, see phone note on spouse to get same information from him as well.

## 2022-01-03 NOTE — Telephone Encounter (Signed)
Patient's husband called to advise that he had visit with Dr. Charlett Blake yesterday and Dr. Charlett Blake wanted to know if she ended up testing positive for covid. They took a test this morning and both of them have covid. Please call to advise if prescription can be called in for them. Patient uses [facility] on [address].

## 2022-01-03 NOTE — Telephone Encounter (Signed)
Symptoms started 2 days ago: If symptoms are mild or moderate, recommend to start molnupiravir, send a prescription. If symptoms severe, chest pain, difficulty breathing, or if she is not getting better: Needs to go to the ER. If possible need to check O2 sats and  blood pressure. ER if O2 sat less than 94% consistently.

## 2022-01-17 ENCOUNTER — Other Ambulatory Visit: Payer: Self-pay | Admitting: Family Medicine

## 2022-01-17 MED ORDER — ACYCLOVIR 5 % EX CREA: 1.0000 | TOPICAL_CREAM | CUTANEOUS | 0 refills | Status: DC

## 2022-01-23 ENCOUNTER — Encounter: Payer: Self-pay | Admitting: Family Medicine

## 2022-01-23 ENCOUNTER — Telehealth: Payer: Self-pay | Admitting: *Deleted

## 2022-01-23 DIAGNOSIS — I10 Essential (primary) hypertension: Secondary | ICD-10-CM

## 2022-01-23 MED ORDER — LISINOPRIL 20 MG PO TABS: 20.0000 mg | ORAL_TABLET | Freq: Every day | ORAL | 1 refills | Status: DC

## 2022-01-23 MED ORDER — ACYCLOVIR 5 % EX CREA: 1.0000 | TOPICAL_CREAM | CUTANEOUS | 3 refills | Status: DC

## 2022-01-23 NOTE — Telephone Encounter (Signed)
Prior auth started via cover my meds.  Awaiting determination.  Key: [id number]

## 2022-01-23 NOTE — Telephone Encounter (Signed)
Left message on machine for patient to call back.    Have she ever taken famcylovir, oral acyclovir, or valcyclovir.

## 2022-01-25 ENCOUNTER — Other Ambulatory Visit: Payer: Self-pay | Admitting: Family Medicine

## 2022-01-25 NOTE — Telephone Encounter (Signed)
Can you try calling this patient again.  I have been trying to contact her.  "your insurance is requiring a prior authorization for your acyclovir.  Have you taken any other medications like famcylcovir (Famvir), acyclovir tabs, or valcyclovir (Valtrex)?"

## 2022-01-26 MED ORDER — LISINOPRIL 20 MG PO TABS: 20.0000 mg | ORAL_TABLET | Freq: Every day | ORAL | 1 refills | Status: DC

## 2022-01-26 NOTE — Telephone Encounter (Signed)
Had to restart new prior auth.  Authorization number [id number].

## 2022-01-26 NOTE — Telephone Encounter (Signed)
Per pt she has not tried anything else.  Prior auth submitted anyway.

## 2022-01-26 NOTE — Telephone Encounter (Signed)
Tried calling pt Kristina Lee to call our office back

## 2022-01-31 NOTE — Telephone Encounter (Signed)
Prior Kristina Lee was denied.  Patient must try and failed 2 following oral acyclovir, Valtrex, or famcyclovir.  Patient has never tried any of them.

## 2022-02-13 ENCOUNTER — Telehealth: Payer: Self-pay | Admitting: Family Medicine

## 2022-02-13 ENCOUNTER — Other Ambulatory Visit: Payer: Self-pay

## 2022-02-13 MED ORDER — ATORVASTATIN CALCIUM 80 MG PO TABS: 80.0000 mg | ORAL_TABLET | Freq: Every day | ORAL | 1 refills | Status: DC

## 2022-02-13 NOTE — Telephone Encounter (Signed)
Patient states her refill for atorvastatin (LIPITOR) 80 MG tablet , was not refilled and she would like to know why. Please advise.

## 2022-02-13 NOTE — Telephone Encounter (Signed)
Medication sent.

## 2022-02-19 ENCOUNTER — Encounter: Payer: Self-pay | Admitting: Family Medicine

## 2022-02-19 ENCOUNTER — Other Ambulatory Visit: Payer: Self-pay

## 2022-02-19 MED ORDER — CLOPIDOGREL BISULFATE 75 MG PO TABS: 75.0000 mg | ORAL_TABLET | Freq: Every day | ORAL | 1 refills | Status: DC

## 2022-02-19 NOTE — Telephone Encounter (Signed)
Medication sent.

## 2022-03-15 ENCOUNTER — Ambulatory Visit: Payer: Medicare PPO | Admitting: Family Medicine

## 2022-03-21 ENCOUNTER — Ambulatory Visit: Payer: Medicare PPO | Admitting: Family Medicine

## 2022-03-21 VITALS — BP 138/74 | HR 67 | Temp 98.0°F | Resp 16 | Ht 62.0 in | Wt 143.6 lb

## 2022-03-21 DIAGNOSIS — H9190 Unspecified hearing loss, unspecified ear: Secondary | ICD-10-CM

## 2022-03-21 DIAGNOSIS — N644 Mastodynia: Secondary | ICD-10-CM | POA: Diagnosis not present

## 2022-03-21 DIAGNOSIS — K2100 Gastro-esophageal reflux disease with esophagitis, without bleeding: Secondary | ICD-10-CM | POA: Diagnosis not present

## 2022-03-21 DIAGNOSIS — L710 Perioral dermatitis: Secondary | ICD-10-CM

## 2022-03-21 DIAGNOSIS — M25511 Pain in right shoulder: Secondary | ICD-10-CM | POA: Insufficient documentation

## 2022-03-21 DIAGNOSIS — M5441 Lumbago with sciatica, right side: Secondary | ICD-10-CM

## 2022-03-21 DIAGNOSIS — G8929 Other chronic pain: Secondary | ICD-10-CM

## 2022-03-21 DIAGNOSIS — E785 Hyperlipidemia, unspecified: Secondary | ICD-10-CM

## 2022-03-21 DIAGNOSIS — M25512 Pain in left shoulder: Secondary | ICD-10-CM

## 2022-03-21 DIAGNOSIS — I10 Essential (primary) hypertension: Secondary | ICD-10-CM | POA: Diagnosis not present

## 2022-03-21 DIAGNOSIS — N183 Chronic kidney disease, stage 3 unspecified: Secondary | ICD-10-CM

## 2022-03-21 DIAGNOSIS — M5442 Lumbago with sciatica, left side: Secondary | ICD-10-CM

## 2022-03-21 DIAGNOSIS — E1169 Type 2 diabetes mellitus with other specified complication: Secondary | ICD-10-CM

## 2022-03-21 MED ORDER — TRIAMCINOLONE ACETONIDE 0.1 % EX CREA: 1.0000 | TOPICAL_CREAM | Freq: Two times a day (BID) | CUTANEOUS | 1 refills | Status: DC

## 2022-03-21 MED ORDER — ATORVASTATIN CALCIUM 80 MG PO TABS: 80.0000 mg | ORAL_TABLET | Freq: Every day | ORAL | 1 refills | Status: DC

## 2022-03-21 NOTE — Assessment & Plan Note (Signed)
Diagnostic MGM ordered

## 2022-03-21 NOTE — Assessment & Plan Note (Signed)
Avoid offending foods, start probiotics. Do not eat large meals in late evening and consider raising head of bed.  

## 2022-03-21 NOTE — Assessment & Plan Note (Signed)
Check xray and referred to ortho for further consideration

## 2022-03-21 NOTE — Assessment & Plan Note (Signed)
Well controlled, no changes to meds. Encouraged heart healthy diet such as the DASH diet and exercise as tolerated.  °

## 2022-03-21 NOTE — Assessment & Plan Note (Signed)
Hydrate and monitor 

## 2022-03-21 NOTE — Assessment & Plan Note (Signed)
Check xrays and referred to ortho for further evalaution

## 2022-03-21 NOTE — Patient Instructions (Signed)
Acute Back Pain, Adult Acute back pain is sudden and usually short-lived. It is often caused by an injury to the muscles and tissues in the back. The injury may result from: A muscle, tendon, or ligament getting overstretched or torn. Ligaments are tissues that connect bones to each other. Lifting something improperly can cause a back strain. Wear and tear (degeneration) of the spinal disks. Spinal disks are circular tissue that provide cushioning between the bones of the spine (vertebrae). Twisting motions, such as while playing sports or doing yard work. A hit to the back. Arthritis. You may have a physical exam, lab tests, and imaging tests to find the cause of your pain. Acute back pain usually goes away with rest and home care. Follow these instructions at home: Managing pain, stiffness, and swelling Take over-the-counter and prescription medicines only as told by your health care provider. Treatment may include medicines for pain and inflammation that are taken by mouth or applied to the skin, or muscle relaxants. Your health care provider may recommend applying ice during the first 24-48 hours after your pain starts. To do this: Put ice in a plastic bag. Place a towel between your skin and the bag. Leave the ice on for 20 minutes, 2-3 times a day. Remove the ice if your skin turns bright red. This is very important. If you cannot feel pain, heat, or cold, you have a greater risk of damage to the area. If directed, apply heat to the affected area as often as told by your health care provider. Use the heat source that your health care provider recommends, such as a moist heat pack or a heating pad. Place a towel between your skin and the heat source. Leave the heat on for 20-30 minutes. Remove the heat if your skin turns bright red. This is especially important if you are unable to feel pain, heat, or cold. You have a greater risk of getting burned. Activity  Do not stay in bed. Staying in  bed for more than 1-2 days can delay your recovery. Sit up and stand up straight. Avoid leaning forward when you sit or hunching over when you stand. If you work at a desk, sit close to it so you do not need to lean over. Keep your chin tucked in. Keep your neck drawn back, and keep your elbows bent at a 90-degree angle (right angle). Sit high and close to the steering wheel when you drive. Add lower back (lumbar) support to your car seat, if needed. Take short walks on even surfaces as soon as you are able. Try to increase the length of time you walk each day. Do not sit, drive, or stand in one place for more than 30 minutes at a time. Sitting or standing for long periods of time can put stress on your back. Do not drive or use heavy machinery while taking prescription pain medicine. Use proper lifting techniques. When you bend and lift, use positions that put less stress on your back: Bend your knees. Keep the load close to your body. Avoid twisting. Exercise regularly as told by your health care provider. Exercising helps your back heal faster and helps prevent back injuries by keeping muscles strong and flexible. Work with a physical therapist to make a safe exercise program, as recommended by your health care provider. Do any exercises as told by your physical therapist. Lifestyle Maintain a healthy weight. Extra weight puts stress on your back and makes it difficult to have good   posture. Avoid activities or situations that make you feel anxious or stressed. Stress and anxiety increase muscle tension and can make back pain worse. Learn ways to manage anxiety and stress, such as through exercise. General instructions Sleep on a firm mattress in a comfortable position. Try lying on your side with your knees slightly bent. If you lie on your back, put a pillow under your knees. Keep your head and neck in a straight line with your spine (neutral position) when using electronic equipment like  smartphones or pads. To do this: Raise your smartphone or pad to look at it instead of bending your head or neck to look down. Put the smartphone or pad at the level of your face while looking at the screen. Follow your treatment plan as told by your health care provider. This may include: Cognitive or behavioral therapy. Acupuncture or massage therapy. Meditation or yoga. Contact a health care provider if: You have pain that is not relieved with rest or medicine. You have increasing pain going down into your legs or buttocks. Your pain does not improve after 2 weeks. You have pain at night. You lose weight without trying. You have a fever or chills. You develop nausea or vomiting. You develop abdominal pain. Get help right away if: You develop new bowel or bladder control problems. You have unusual weakness or numbness in your arms or legs. You feel faint. These symptoms may represent a serious problem that is an emergency. Do not wait to see if the symptoms will go away. Get medical help right away. Call your local emergency services (911 in the [place] Do not drive yourself to the hospital. Summary Acute back pain is sudden and usually short-lived. Use proper lifting techniques. When you bend and lift, use positions that put less stress on your back. Take over-the-counter and prescription medicines only as told by your health care provider, and apply heat or ice as told. This information is not intended to replace advice given to you by your health care provider. Make sure you discuss any questions you have with your health care provider. Document Revised: 06/24/2020 Document Reviewed: 06/24/2020 Elsevier Patient Education  2023 [employer]vier Inc.  

## 2022-03-21 NOTE — Assessment & Plan Note (Signed)
Encourage heart healthy diet such as MIND or DASH diet, increase exercise, avoid trans fats, simple carbohydrates and processed foods, consider a krill or fish or flaxseed oil cap daily.  °

## 2022-03-21 NOTE — Assessment & Plan Note (Signed)
Allowed a prescription for Triamcinolone 0.1% cream to use sparingly, her insurance has been unwillingly to pay for other options.

## 2022-03-21 NOTE — Progress Notes (Signed)
Subjective:    Patient ID: Kristina Lee, female    DOB: 1945-05-08, 76 y.o.   MRN: [id number]  Chief Complaint  Patient presents with   Follow-up    Follow up    HPI Patient is in today for follow up on chronic medical concerns. No recent febrile illness or hospitalizations. Denies CP/palp/SOB/HA/congestion/fevers/GI or GU c/o. Taking meds as prescribed. She is noting hearing loss progression but has chosen not to use her hearing aides as they bother her. She is strugging with increasing pain and decreasing ROM in her shoulders and her low back. No recent falls or trauma. No complaints of worsening incontinence.   Past Medical History:  Diagnosis Date   Adjustment disorder with depressed mood 03/14/2021   Allergic rhinitis 07/14/2014   Allergic urticaria 12/23/2017   CAD (coronary artery disease) 09/25/2017   Last Assessment & Plan:  History of CAD status post non-STEMI 09/02/2017 at [facility] in [place].  She underwent proximal LAD stenting via a radial approach.  EF was 45 to 50% she is on dual and type of therapy with aspirin and Brilinta.  We will recheck a 2D echo for LV fu   Chronic kidney disease, stage III (moderate) 05/02/2015   DDD (degenerative disc disease), lumbosacral 09/13/2015   Noted during 09/08/15 ed visit (L4-L5 and L5-S1) Noted during 09/08/15 ed visit (L4-L5 and L5-S1)  Formatting of this note might be different from the original. Overview:  Noted during 09/08/15 ed visit (L4-L5 and L5-S1) Formatting of this note might be different from the original. Noted during 09/08/15 ed visit (L4-L5 and L5-S1)   Diverticulosis of large intestine 09/13/2015   Incidentally found, extensive, entire colon found on ED visit on CT scan 09/08/15 Incidentally found, extensive, entire colon found on ED visit on CT scan 09/08/15  Formatting of this note might be different from the original. Overview:  Incidentally found, extensive, entire colon found on ED visit on CT scan 09/08/15  Formatting of this note might be different from the original. Incidentally found, e   Essential hypertension 07/14/2014   Gastroesophageal reflux disease with esophagitis 09/04/2017   Hearing loss 08/10/2018   History of oral lesions 10/09/2019   History of uterine cancer 07/14/2014   s/p complete hysterectomy and B oophorectomy at [facility] no radiation. s/p complete hysterectomy and B oophorectomy at [facility] no radiation.  Formatting of this note might be different from the original. Overview:  s/p complete hysterectomy and B oophorectomy at [facility] no radiation. Formatting of this note might be different from the original. s/p complete hysterectomy and B oophorectomy at [facility] no radiati   Hyperglycemia 09/29/2020   Hyperlipidemia associated with type 2 diabetes mellitus 12/31/2016   Hypokalemia 10/28/2015   Intrinsic atopic dermatitis 12/23/2017   Low back pain 09/25/2017   Mild cognitive impairment with memory loss 10/05/2021   Myalgia 02/09/2018   Myocardial infarction 12/23/2017   Nuclear sclerotic cataract of left eye 11/09/2020   Otitis externa 08/10/2018   Seborrheic dermatitis of scalp 09/13/2015   Suspected   Tinnitus of left ear 09/29/2020   Type II diabetes mellitus 12/23/2017   Urticaria    Varicose veins of right lower extremity with complications 05/08/2018    Past Surgical History:  Procedure Laterality Date   ABDOMINAL HYSTERECTOMY  2006   b/l SPO and TAH, uterine cancer   EYE SURGERY Right 1960   TUBAL LIGATION      Family History  Problem Relation Age of Onset   Cancer Mother  liver cancer, breast cancer x 2    Alcohol abuse Mother        smoker   Heart disease Father        MI   Alcohol abuse Father        smoker   Depression Sister    Tremor Sister    Dementia Sister    Cancer Brother        pancreatic   Diabetes Maternal Grandmother    Hearing loss Maternal Grandmother    Depression Maternal Grandmother    Healthy Son    Allergic rhinitis Neg Hx     Angioedema Neg Hx    Asthma Neg Hx    Eczema Neg Hx    Immunodeficiency Neg Hx    Urticaria Neg Hx     Social History   Socioeconomic History   Marital status: Married    Spouse name: Not on file   Number of children: 3   Years of education: 18   Highest education level: Master's degree (e.g., MA, MS, MEng, MEd, MSW, MBA)  Occupational History   Occupation: Retired    Comment: Runner, broadcasting/film/video  Tobacco Use   Smoking status: Former    Packs/day: 0.50    Years: 24.00    Total pack years: 12.00    Types: Cigarettes    Quit date: 04/17/1987    Years since quitting: 34.9   Smokeless tobacco: Never  Vaping Use   Vaping Use: Never used  Substance and Sexual Activity   Alcohol use: Yes    Comment: occ   Drug use: Not Currently   Sexual activity: Yes  Other Topics Concern   Not on file  Social History Narrative   Right handed   Drinks caffeine   2 story home   Social Determinants of Health   Financial Resource Strain: Low Risk  (09/29/2020)   Overall Financial Resource Strain (CARDIA)    Difficulty of Paying Living Expenses: Not hard at all  Food Insecurity: No Food Insecurity (09/29/2020)   Hunger Vital Sign    Worried About Running Out of Food in the Last Year: Never true    Ran Out of Food in the Last Year: Never true  Transportation Needs: No Transportation Needs (09/29/2020)   PRAPARE - Administrator, Civil Service (Medical): No    Lack of Transportation (Non-Medical): No  Physical Activity: Inactive (09/29/2020)   Exercise Vital Sign    Days of Exercise per Week: 0 days    Minutes of Exercise per Session: 0 min  Stress: Stress Concern Present (09/29/2020)   [employer] of Occupational Health - Occupational Stress Questionnaire    Feeling of Stress : To some extent  Social Connections: Moderately Isolated (09/29/2020)   Social Connection and Isolation Panel [NHANES]    Frequency of Communication with Friends and Family: More than three times a week     Frequency of Social Gatherings with Friends and Family: Once a week    Attends Religious Services: Never    Database administrator or Organizations: No    Attends Banker Meetings: Never    Marital Status: Married  Catering manager Violence: Not At Risk (09/29/2020)   Humiliation, Afraid, Rape, and Kick questionnaire    Fear of Current or Ex-Partner: No    Emotionally Abused: No    Physically Abused: No    Sexually Abused: No    Outpatient Medications Prior to Visit  Medication Sig Dispense Refill   Acetaminophen (TYLENOL  PO) Take by mouth as needed.     aspirin EC 81 MG tablet Take 81 mg by mouth daily.     Calcium Carbonate-Vit D-Min (CALCIUM 600+D PLUS MINERALS) 600-400 MG-UNIT TABS Take 2 tablets by mouth daily.      clopidogrel (PLAVIX) 75 MG tablet Take 1 tablet (75 mg total) by mouth daily. 90 tablet 1   diclofenac Sodium (VOLTAREN) 1 % GEL Apply 4 g topically 4 (four) times daily as needed. 300 g 2   fluticasone (FLONASE) 50 MCG/ACT nasal spray Place 2 sprays into both nostrils daily as needed for allergies or rhinitis. 48 g 1   lisinopril (ZESTRIL) 20 MG tablet Take 1 tablet (20 mg total) by mouth daily. 90 tablet 1   memantine (NAMENDA) 10 MG tablet TAKE 1 TABLET (10 MG AT NIGHT) FOR 2 WEEKS, THEN INCREASE TO 1 TABLET (10 MG) TWICE A DAY 180 tablet 2   OVER THE COUNTER MEDICATION as needed.     polyethylene glycol (MIRALAX / GLYCOLAX) 17 g packet Take 17 g by mouth as needed.     acyclovir cream (ZOVIRAX) 5 % Apply 1 Application topically every 3 (three) hours. As needed for scabbing at corners of mouth, cold sores 5 g 3   atorvastatin (LIPITOR) 80 MG tablet Take 1 tablet (80 mg total) by mouth daily. 90 tablet 1   augmented betamethasone dipropionate (DIPROLENE-AF) 0.05 % cream Apply topically daily as needed. 50 g 1   triamcinolone (KENALOG) 0.025 % ointment Apply 1 Application topically 2 (two) times daily as needed. Rash on face 30 g 0   No  facility-administered medications prior to visit.    Allergies  Allergen Reactions   Amoxicillin Anaphylaxis   Hydroxyzine Anaphylaxis   Sulfa Antibiotics Swelling    Rash      Review of Systems  Constitutional:  Positive for malaise/fatigue. Negative for fever.  HENT:  Positive for hearing loss. Negative for congestion, ear discharge, ear pain and tinnitus.   Eyes:  Negative for blurred vision.  Respiratory:  Negative for shortness of breath.   Cardiovascular:  Negative for chest pain, palpitations and leg swelling.  Gastrointestinal:  Positive for heartburn. Negative for abdominal pain, blood in stool, diarrhea, nausea and vomiting.  Genitourinary:  Negative for dysuria and frequency.  Musculoskeletal:  Negative for falls.  Skin:  Negative for rash.  Neurological:  Negative for dizziness, loss of consciousness and headaches.  Endo/Heme/Allergies:  Negative for environmental allergies.  Psychiatric/Behavioral:  Positive for memory loss. Negative for depression. The patient is nervous/anxious.        Objective:    Physical Exam Constitutional:      General: She is not in acute distress.    Appearance: Normal appearance. She is well-developed. She is not toxic-appearing.  HENT:     Head: Normocephalic and atraumatic.     Right Ear: External ear normal.     Left Ear: External ear normal.     Nose: Nose normal.  Eyes:     General:        Right eye: No discharge.        Left eye: No discharge.     Conjunctiva/sclera: Conjunctivae normal.  Neck:     Thyroid: No thyromegaly.  Cardiovascular:     Rate and Rhythm: Normal rate and regular rhythm.     Heart sounds: Normal heart sounds. No murmur heard. Pulmonary:     Effort: Pulmonary effort is normal. No respiratory distress.     Breath sounds:  Normal breath sounds.  Abdominal:     General: Bowel sounds are normal.     Palpations: Abdomen is soft.     Tenderness: There is no abdominal tenderness. There is no guarding.   Musculoskeletal:        General: Normal range of motion.     Cervical back: Neck supple.  Lymphadenopathy:     Cervical: No cervical adenopathy.  Skin:    General: Skin is warm and dry.  Neurological:     Mental Status: She is alert and oriented to person, place, and time.  Psychiatric:        Mood and Affect: Mood normal.        Behavior: Behavior normal.        Thought Content: Thought content normal.        Judgment: Judgment normal.     BP 138/74 (BP Location: Right Arm, Patient Position: Sitting, Cuff Size: Normal)   Pulse 67   Temp 98 F (36.7 C) (Oral)   Resp 16   Ht 5\' 2"  (1.575 m)   Wt 143 lb 9.6 oz (65.1 kg)   LMP  (LMP Unknown)   SpO2 97%   BMI 26.26 kg/m  Wt Readings from Last 3 Encounters:  03/21/22 143 lb 9.6 oz (65.1 kg)  12/11/21 138 lb 9.6 oz (62.9 kg)  11/16/21 140 lb (63.5 kg)    Diabetic Foot Exam - Simple   No data filed    Lab Results  Component Value Date   WBC 7.7 12/28/2021   HGB 15.2 (H) 12/28/2021   HCT 45.4 12/28/2021   PLT 185.0 12/28/2021   GLUCOSE 120 (H) 12/28/2021   CHOL 115 12/28/2021   TRIG 87.0 12/28/2021   HDL 49.80 12/28/2021   LDLCALC 48 12/28/2021   ALT 17 12/28/2021   AST 19 12/28/2021   NA 143 12/28/2021   K 4.1 12/28/2021   CL 104 12/28/2021   CREATININE 1.00 12/28/2021   BUN 21 12/28/2021   CO2 29 12/28/2021   TSH 1.36 12/28/2021   HGBA1C 6.7 (H) 12/28/2021    Lab Results  Component Value Date   TSH 1.36 12/28/2021   Lab Results  Component Value Date   WBC 7.7 12/28/2021   HGB 15.2 (H) 12/28/2021   HCT 45.4 12/28/2021   MCV 89.1 12/28/2021   PLT 185.0 12/28/2021   Lab Results  Component Value Date   NA 143 12/28/2021   K 4.1 12/28/2021   CO2 29 12/28/2021   GLUCOSE 120 (H) 12/28/2021   BUN 21 12/28/2021   CREATININE 1.00 12/28/2021   BILITOT 1.6 (H) 12/28/2021   ALKPHOS 66 12/28/2021   AST 19 12/28/2021   ALT 17 12/28/2021   PROT 6.9 12/28/2021   ALBUMIN 4.1 12/28/2021   CALCIUM 9.8  12/28/2021   ANIONGAP 11 03/12/2018   GFR 54.80 (L) 12/28/2021   Lab Results  Component Value Date   CHOL 115 12/28/2021   Lab Results  Component Value Date   HDL 49.80 12/28/2021   Lab Results  Component Value Date   LDLCALC 48 12/28/2021   Lab Results  Component Value Date   TRIG 87.0 12/28/2021   Lab Results  Component Value Date   CHOLHDL 2 12/28/2021   Lab Results  Component Value Date   HGBA1C 6.7 (H) 12/28/2021       Assessment & Plan:  Essential hypertension Assessment & Plan: Well controlled, no changes to meds. Encouraged heart healthy diet such as the DASH diet  and exercise as tolerated.     Bilateral shoulder pain, unspecified chronicity Assessment & Plan: Check xrays and referred to ortho for further evalaution  Orders: -     DG Shoulder Left; Future -     DG Shoulder Right; Future -     Ambulatory referral to Orthopedic Surgery  Chronic midline low back pain with bilateral sciatica Assessment & Plan: Check xray and referred to ortho for further consideration  Orders: -     DG Lumbar Spine 2-3 Views; Future -     Ambulatory referral to Orthopedic Surgery  Breast pain, right Assessment & Plan: Diagnostic MGM ordered  Orders: -     MM DIAG BREAST TOMO BILATERAL; Future  Hyperlipidemia associated with type 2 diabetes mellitus Assessment & Plan: Encourage heart healthy diet such as MIND or DASH diet, increase exercise, avoid trans fats, simple carbohydrates and processed foods, consider a krill or fish or flaxseed oil cap daily.     Stage 3 chronic kidney disease, unspecified whether stage 3a or 3b CKD (HCC) Assessment & Plan: Hydrate and monitor   Hearing loss, unspecified hearing loss type, unspecified laterality Assessment & Plan: She is not using her hearing aides but is encouraged to do so   Perioral dermatitis Assessment & Plan: Allowed a prescription for Triamcinolone 0.1% cream to use sparingly, her insurance has been  unwillingly to pay for other options.    Gastroesophageal reflux disease with esophagitis, unspecified whether hemorrhage Assessment & Plan: Avoid offending foods, start probiotics. Do not eat large meals in late evening and consider raising head of bed.     Other orders -     Atorvastatin Calcium; Take 1 tablet (80 mg total) by mouth daily.  Dispense: 90 tablet; Refill: 1 -     Triamcinolone Acetonide; Apply 1 Application topically 2 (two) times daily.  Dispense: 80 g; Refill: 1    Danise Edge, MD

## 2022-03-21 NOTE — Assessment & Plan Note (Signed)
She is not using her hearing aides but is encouraged to do so

## 2022-03-22 ENCOUNTER — Other Ambulatory Visit: Payer: Self-pay | Admitting: Family Medicine

## 2022-03-22 DIAGNOSIS — N644 Mastodynia: Secondary | ICD-10-CM

## 2022-03-29 DIAGNOSIS — M25512 Pain in left shoulder: Secondary | ICD-10-CM | POA: Diagnosis not present

## 2022-03-29 DIAGNOSIS — M542 Cervicalgia: Secondary | ICD-10-CM | POA: Diagnosis not present

## 2022-03-29 DIAGNOSIS — M25511 Pain in right shoulder: Secondary | ICD-10-CM | POA: Diagnosis not present

## 2022-04-04 ENCOUNTER — Ambulatory Visit: Payer: Medicare PPO | Admitting: Cardiovascular Disease

## 2022-04-23 DIAGNOSIS — R4701 Aphasia: Secondary | ICD-10-CM | POA: Diagnosis not present

## 2022-04-23 DIAGNOSIS — M545 Low back pain, unspecified: Secondary | ICD-10-CM | POA: Diagnosis not present

## 2022-04-23 DIAGNOSIS — M5030 Other cervical disc degeneration, unspecified cervical region: Secondary | ICD-10-CM | POA: Diagnosis not present

## 2022-04-27 DIAGNOSIS — M25619 Stiffness of unspecified shoulder, not elsewhere classified: Secondary | ICD-10-CM | POA: Diagnosis not present

## 2022-04-27 DIAGNOSIS — R29898 Other symptoms and signs involving the musculoskeletal system: Secondary | ICD-10-CM | POA: Diagnosis not present

## 2022-04-27 DIAGNOSIS — Z789 Other specified health status: Secondary | ICD-10-CM | POA: Diagnosis not present

## 2022-04-27 DIAGNOSIS — Z7409 Other reduced mobility: Secondary | ICD-10-CM | POA: Diagnosis not present

## 2022-04-27 DIAGNOSIS — M542 Cervicalgia: Secondary | ICD-10-CM | POA: Diagnosis not present

## 2022-05-10 DIAGNOSIS — R29898 Other symptoms and signs involving the musculoskeletal system: Secondary | ICD-10-CM | POA: Diagnosis not present

## 2022-05-10 DIAGNOSIS — Z789 Other specified health status: Secondary | ICD-10-CM | POA: Diagnosis not present

## 2022-05-10 DIAGNOSIS — Z7409 Other reduced mobility: Secondary | ICD-10-CM | POA: Diagnosis not present

## 2022-05-10 DIAGNOSIS — M542 Cervicalgia: Secondary | ICD-10-CM | POA: Diagnosis not present

## 2022-05-10 DIAGNOSIS — M25619 Stiffness of unspecified shoulder, not elsewhere classified: Secondary | ICD-10-CM | POA: Diagnosis not present

## 2022-05-22 ENCOUNTER — Ambulatory Visit: Payer: Medicare PPO | Admitting: Physician Assistant

## 2022-05-23 ENCOUNTER — Ambulatory Visit
Admission: RE | Admit: 2022-05-23 | Discharge: 2022-05-23 | Disposition: A | Discharge status: Home / Self Care | Payer: Medicare PPO | Source: Ambulatory Visit | Attending: Family Medicine | Admitting: Family Medicine

## 2022-05-23 ENCOUNTER — Ambulatory Visit: Payer: Medicare PPO

## 2022-05-23 DIAGNOSIS — N644 Mastodynia: Secondary | ICD-10-CM

## 2022-05-25 ENCOUNTER — Encounter: Payer: Self-pay | Admitting: Cardiovascular Disease

## 2022-05-25 ENCOUNTER — Ambulatory Visit: Payer: Medicare PPO | Attending: Cardiovascular Disease | Admitting: Cardiovascular Disease

## 2022-05-25 VITALS — BP 128/76 | HR 54 | Ht 60.0 in | Wt 141.4 lb

## 2022-05-25 DIAGNOSIS — I10 Essential (primary) hypertension: Secondary | ICD-10-CM

## 2022-05-25 DIAGNOSIS — E785 Hyperlipidemia, unspecified: Secondary | ICD-10-CM | POA: Diagnosis not present

## 2022-05-25 DIAGNOSIS — I2510 Atherosclerotic heart disease of native coronary artery without angina pectoris: Secondary | ICD-10-CM | POA: Diagnosis not present

## 2022-05-25 DIAGNOSIS — E1169 Type 2 diabetes mellitus with other specified complication: Secondary | ICD-10-CM | POA: Diagnosis not present

## 2022-05-25 NOTE — Progress Notes (Signed)
05/25/2022 Brithney Spinuzzi [facility]   Mar 05, 1946  [id number]  Primary Physician Mosie Lukes, MD Primary Cardiologist: Lorretta Harp MD Lupe Carney, [place]  HPI:  Kristina Lee is a 77 y.o.   thin appearing married Caucasian female mother of one biologic child, grandmother to 72 grandchildren who worked as a Production manager.  She was referred by [facility] for cardiovascular evaluation evaluation and to be established in my practice because of her recent non-STEMI and intervention.  I last saw her in the office 03/17/2021.  Her Risk factors include remote tobacco abuse having quit on 01/04/1988 after smoking 25 pack years.  She has treated hypertension, hyperlipidemia and diabetes which is not treated.  Her father did die of a microinfarction at age 67.  She had atypical symptoms leading up to a non-STEMI 09/02/2017 she was in [place] and had stenting of her proximal LAD via her right radial approach.  She had no other significant CAD.  EF was in the 45 to 50% range.  She is had no recurrent symptoms.  She remains on aspirin and Brilinta.  She had a 2D echo performed 11/05/2017 which was entirely normal.   Since I saw her in the office a year ago she continues to do well.  Her son and 4 children who are living with her have moved out.  He is a Environmental consultant.  She is completely asymptomatic denies chest pain or shortness of breath.   Current Meds  Medication Sig   Acetaminophen (TYLENOL PO) Take by mouth as needed.   aspirin EC 81 MG tablet Take 81 mg by mouth daily.   atorvastatin (LIPITOR) 80 MG tablet Take 1 tablet (80 mg total) by mouth daily.   Calcium Carbonate-Vit D-Min (CALCIUM 600+D PLUS MINERALS) 600-400 MG-UNIT TABS Take 2 tablets by mouth daily.    clopidogrel (PLAVIX) 75 MG tablet Take 1 tablet (75 mg total) by mouth daily.   fluticasone (FLONASE) 50 MCG/ACT nasal spray Place 2 sprays into both nostrils daily as needed for allergies or rhinitis.    lisinopril (ZESTRIL) 20 MG tablet Take 1 tablet (20 mg total) by mouth daily.   memantine (NAMENDA) 10 MG tablet TAKE 1 TABLET (10 MG AT NIGHT) FOR 2 WEEKS, THEN INCREASE TO 1 TABLET (10 MG) TWICE A DAY   OVER THE COUNTER MEDICATION as needed.     Allergies  Allergen Reactions   Amoxicillin Anaphylaxis   Hydroxyzine Anaphylaxis   Sulfa Antibiotics Swelling    Rash      Social History   Socioeconomic History   Marital status: Married    Spouse name: Not on file   Number of children: 3   Years of education: 70   Highest education level: Master's degree (e.g., MA, MS, MEng, MEd, MSW, MBA)  Occupational History   Occupation: Retired    Comment: Pharmacist, hospital  Tobacco Use   Smoking status: Former    Packs/day: 0.50    Years: 24.00    Total pack years: 12.00    Types: Cigarettes    Quit date: 04/17/1987    Years since quitting: 35.1   Smokeless tobacco: Never  Vaping Use   Vaping Use: Never used  Substance and Sexual Activity   Alcohol use: Yes    Comment: occ   Drug use: Not Currently   Sexual activity: Yes  Other Topics Concern   Not on file  Social History Narrative   Right handed   Drinks caffeine  2 story home   Social Determinants of Health   Financial Resource Strain: Low Risk  (09/29/2020)   Overall Financial Resource Strain (CARDIA)    Difficulty of Paying Living Expenses: Not hard at all  Food Insecurity: No Food Insecurity (09/29/2020)   Hunger Vital Sign    Worried About Running Out of Food in the Last Year: Never true    Ran Out of Food in the Last Year: Never true  Transportation Needs: No Transportation Needs (09/29/2020)   PRAPARE - Hydrologist (Medical): No    Lack of Transportation (Non-Medical): No  Physical Activity: Inactive (09/29/2020)   Exercise Vital Sign    Days of Exercise per Week: 0 days    Minutes of Exercise per Session: 0 min  Stress: Stress Concern Present (09/29/2020)   [place]    Feeling of Stress : To some extent  Social Connections: Moderately Isolated (09/29/2020)   Social Connection and Isolation Panel [NHANES]    Frequency of Communication with Friends and Family: More than three times a week    Frequency of Social Gatherings with Friends and Family: Once a week    Attends Religious Services: Never    Marine scientist or Organizations: No    Attends Archivist Meetings: Never    Marital Status: Married  Human resources officer Violence: Not At Risk (09/29/2020)   Humiliation, Afraid, Rape, and Kick questionnaire    Fear of Current or Ex-Partner: No    Emotionally Abused: No    Physically Abused: No    Sexually Abused: No     Review of Systems: General: negative for chills, fever, night sweats or weight changes.  Cardiovascular: negative for chest pain, dyspnea on exertion, edema, orthopnea, palpitations, paroxysmal nocturnal dyspnea or shortness of breath Dermatological: negative for rash Respiratory: negative for cough or wheezing Urologic: negative for hematuria Abdominal: negative for nausea, vomiting, diarrhea, bright red blood per rectum, melena, or hematemesis Neurologic: negative for visual changes, syncope, or dizziness All other systems reviewed and are otherwise negative except as noted above.    Blood pressure 128/76, pulse (!) 54, height 5' (1.524 m), weight 141 lb 6.4 oz (64.1 kg), SpO2 96 %.  General appearance: alert and no distress Neck: no adenopathy, no carotid bruit, no JVD, supple, symmetrical, trachea midline, and thyroid not enlarged, symmetric, no tenderness/mass/nodules Lungs: clear to auscultation bilaterally Heart: regular rate and rhythm, S1, S2 normal, no murmur, click, rub or gallop Extremities: extremities normal, atraumatic, no cyanosis or edema Pulses: 2+ and symmetric Skin: Skin color, texture, turgor normal. No rashes or lesions Neurologic: Grossly normal  EKG  sinus bradycardia at 54 with nonspecific ST and T wave changes.  Personally reviewed this EKG.  ASSESSMENT AND PLAN:   Essential hypertension History of essential hypertension with blood pressure measured today at 128/76.  She is on lisinopril.  Hyperlipidemia associated with type 2 diabetes mellitus History of hyperlipidemia on statin therapy with lipid profile performed 12/28/2021 revealing total cholesterol 115, LDL 48 and HDL 49.  CAD (coronary artery disease) History of CAD status post non-STEMI 09/02/2017 in [place].  She had LAD stenting without significant disease in her other vessels.  Initially her LV function was mildly reduced but ultimately normalized by echo 11/05/2017.  She is completely asymptomatic on a baby aspirin.     Lorretta Harp MD FACP,FACC,FAHA, [facility] 05/25/2022 11:57 AM

## 2022-05-25 NOTE — Assessment & Plan Note (Signed)
History of CAD status post non-STEMI 09/02/2017 in [place].  She had LAD stenting without significant disease in her other vessels.  Initially her LV function was mildly reduced but ultimately normalized by echo 11/05/2017.  She is completely asymptomatic on a baby aspirin.

## 2022-05-25 NOTE — Patient Instructions (Signed)
Medication Instructions:  No changes *If you need a refill on your cardiac medications before your next appointment, please call your pharmacy*  Follow-Up: At [facility], you and your health needs are our priority.  As part of our continuing mission to provide you with exceptional heart care, we have created designated Provider Care Teams.  These Care Teams include your primary Cardiologist (physician) and Advanced Practice Providers (APPs -  Physician Assistants and Nurse Practitioners) who all work together to provide you with the care you need, when you need it.  We recommend signing up for the patient portal called "MyChart".  Sign up information is provided on this After Visit Summary.  MyChart is used to connect with patients for Virtual Visits (Telemedicine).  Patients are able to view lab/test results, encounter notes, upcoming appointments, etc.  Non-urgent messages can be sent to your provider as well.   To learn more about what you can do with MyChart, go to [email or web address].    Your next appointment:   1 year(s)  Provider:   Quay Burow, MD

## 2022-05-25 NOTE — Assessment & Plan Note (Signed)
History of hyperlipidemia on statin therapy with lipid profile performed 12/28/2021 revealing total cholesterol 115, LDL 48 and HDL 49.

## 2022-05-25 NOTE — Assessment & Plan Note (Signed)
History of essential hypertension with blood pressure measured today at 128/76.  She is on lisinopril.

## 2022-05-31 DIAGNOSIS — Z789 Other specified health status: Secondary | ICD-10-CM | POA: Diagnosis not present

## 2022-05-31 DIAGNOSIS — M25619 Stiffness of unspecified shoulder, not elsewhere classified: Secondary | ICD-10-CM | POA: Diagnosis not present

## 2022-05-31 DIAGNOSIS — R29898 Other symptoms and signs involving the musculoskeletal system: Secondary | ICD-10-CM | POA: Diagnosis not present

## 2022-05-31 DIAGNOSIS — Z7409 Other reduced mobility: Secondary | ICD-10-CM | POA: Diagnosis not present

## 2022-05-31 DIAGNOSIS — M542 Cervicalgia: Secondary | ICD-10-CM | POA: Diagnosis not present

## 2022-06-18 DIAGNOSIS — M542 Cervicalgia: Secondary | ICD-10-CM | POA: Diagnosis not present

## 2022-06-18 DIAGNOSIS — M25619 Stiffness of unspecified shoulder, not elsewhere classified: Secondary | ICD-10-CM | POA: Diagnosis not present

## 2022-06-18 DIAGNOSIS — R29898 Other symptoms and signs involving the musculoskeletal system: Secondary | ICD-10-CM | POA: Diagnosis not present

## 2022-06-18 DIAGNOSIS — Z789 Other specified health status: Secondary | ICD-10-CM | POA: Diagnosis not present

## 2022-06-18 DIAGNOSIS — Z7409 Other reduced mobility: Secondary | ICD-10-CM | POA: Diagnosis not present

## 2022-06-22 ENCOUNTER — Ambulatory Visit: Payer: Medicare PPO | Admitting: Physician Assistant

## 2022-06-22 ENCOUNTER — Encounter: Payer: Self-pay | Admitting: Physician Assistant

## 2022-06-22 VITALS — BP 171/91 | HR 67 | Resp 18 | Ht 61.0 in | Wt 144.0 lb

## 2022-06-22 DIAGNOSIS — G309 Alzheimer's disease, unspecified: Secondary | ICD-10-CM

## 2022-06-22 DIAGNOSIS — F0280 Dementia in other diseases classified elsewhere without behavioral disturbance: Secondary | ICD-10-CM

## 2022-06-22 NOTE — Progress Notes (Addendum)
Assessment/Plan:   Dementia likely due to Alzheimer disease, late onset.  Kristina Lee is a very pleasant 77 y.o. RH female  with a history of hypertension, hyperlipidemia, CAD, CKD stage III, diverticulosis, hearing loss, DM2 diet controlled seen today in follow up for memory loss. Neuropsych evaluation on 10/05/2021 noted diffuse impairment, with etiology difficult to discern, but consideration needs to be given to underlying Alzheimer's disease presentation. Patient is currently on memantine 10 mg bid but is unclear if she has been taking the medication.  She does not want to start any new medications.  She reports that she is in charge of it.  Of note, her blood pressure on presentation was dangerously up, which raises the question of compliance.  Her MMSE today is 20/30, slightly worse from 1 year prior.  She seems to struggle to have a good flow in the conversation as she struggled to think of the words. Recommendation   Follow up in 6  months. Continue Memantine 10 mg twice daily. Side effects were discussed, recommend that her husband take over her medication administration Recommend good control of cardiovascular risk factors.  BP is dangerously elevated today at 210 /97, patient has been informed.  Again, she needs monitoring with meds Continue to control mood as per PCP Recommend no driving for safety   Recommend home health nursing, sitters or 24/7 monitoring for safety    Subjective:    This patient is here alone  who supplements the history.  Previous records as well as any outside records available were reviewed prior to todays visit. Patient was last seen on 11/2021. Last MMSE was 23/30 on 07/2021     Any changes in memory since last visit? Sometimes she does not recognize her husband.  She denies any changes, and needs to be redirected several times during the conversation.  She becomes very tangential, and when asked questions about her memory, she talks about her sister  who is sick. repeats oneself?  Endorsed, but does not elaborate Disoriented when walking into a room?  Endorsed, sometimes she does not need that does not recognize her house Leaving objects in unusual places?    denies   Wandering behavior?  denies   Any personality changes since last visit?  She has been under stress. Any worsening depression?:  She reports that her Sister is sick with "something in her head and needs to go tot the hospital and now she is waking up" Hallucinations or paranoia?  Denies  Seizures?    denies    Any sleep changes?  Denies vivid dreams, REM behavior or sleepwalking   Sleep apnea?   denies   Any hygiene concerns?    denies   Independent of bathing and dressing?  Endorsed  Does the patient needs help with medications? Patient is in charge  Who is in charge of the finances?  Both patient and her husband are in charge    Any changes in appetite?  denies    Patient have trouble swallowing?  denies   Does the patient cook?  Any kitchen accidents such as leaving the stove on? Patient denies   Any headaches?   denies   Chronic back pain  denies   Ambulates with difficulty?     denies   Recent falls or head injuries? denies     Unilateral weakness, numbness or tingling?    denies   Any tremors?  denies   Any anosmia?  Patient denies   Any incontinence  of urine?  denies   Any bowel dysfunction?   denies      Patient lives with her husband Does the patient drive? She reports that she drives in the daytime only, short distances.   Initial visit 11/2021 How long did patient have memory difficulties?  Possibly about 18 months ago, when she was having difficulty finding the right word.  Short-term memory is worse than long-term memory although they are both affected.  Sometimes she does not recognize her husband when looking at the family picture.  Here she has trouble understanding your new normal .  She continues to read, and occasionally she plays Scrabble.   Patient  lives with: Spouse  repeats oneself?  Endorsed but not often. Disoriented when walking into a room?  "I did not recognize people at a party who decorated the house like mine "(this was her actual house).  She also stated at the time, " a man came whom she asked "who are you?  "(This was actually her husband whom she did not recognize).  "She actually had to go to the attic and look at the pictures including her wedding pictures to see food that mom was, this went away after 2 days " Leaving objects in unusual places?  She does misplace or losing things and her place, but attributes this to age. Ambulates  with difficulty?   Patient denies   Recent falls?  Patient denies   Any head injuries?  Patient denies   History of seizures?   Patient denies   Wandering behavior?  Patient denies   Patient drives?. "I am a very safe driver". I drive only during the day.  Her husband agrees Any mood changes? Endorsed. Having trouble accepting this new normal.  Also, she has some depression because in the brief period of time, several members of her family and friend died, her son is going through a tough divorce, and she has 1 sister with dementia.  Prior to that, she did not carry a diagnosis of depression. Hallucinations?  Sometimes she sees 3 people in her room who "talk to her ".  Sometimes she hears someone banging in the bedroom door. Paranoia?  Patient denies   Patient reports that she sleeps well without vivid dreams, REM behavior or sleepwalking.  She sleeps about 7 hours a night. History of sleep apnea?  Patient denies   Any hygiene concerns?  Patient denies   Independent of bathing and dressing?  Endorsed  Does the patient needs help with medications? Patient in charge  Who is in charge of the finances?  Patient is in charge with husband Any changes in appetite?  Patient denies   Patient have trouble swallowing? Patient denies   Does the patient cook?  Patient denies   Any kitchen accidents such as  leaving the stove on? Patient denies   Any headaches?  Patient denies   The double vision? Patient denies   Any focal numbness or tingling?  Patient denies   Chronic back pain Patient denies   Unilateral weakness?  Patient denies   Any tremors?  Patient denies   Any history of anosmia?  Patient denies   Any incontinence of urine?  Patient denies   Any bowel dysfunction?   Patient denies      History of heavy alcohol intake?  Patient denies   History of heavy tobacco use?  Patient denies   Family history of dementia?  She has 1 sister with dementia.   Masters degree,  and she is fluent in [place], [place] and [place].    Neuropsychological evaluation, Dr. Melvyn Novas 10/05/2021 Briefly, results suggested diffuse cognitive impairment often in severe ranges relative to age-matched peers. Performances were adequate across basic attention, phonemic fluency, and some visuoconstructional tasks (i.e., figure copy, clock drawing). However, severe impairment was exhibited across all other assessed domains. This includes processing speed, complex attention, executive functioning, safety/judgment, receptive language, semantic fluency, confrontation naming, and all aspects of learning and memory. Given diffuse impairment, the etiology of ongoing impairment is difficult to discern. Despite this, consideration needs to be given to an underlying Alzheimer's disease presentation. Across memory measures, Kristina Lee did not benefit from repeated exposure to information, exhibited minimal ability to recall previously learned information after a brief delay, and performed poorly across yes/no recognition trials. Taken together, this suggests evidence for rapid forgetting and an information storage deficit, which are the hallmark memory characteristics of this illness. Further impairments surrounding expressive language (i.e., confrontation naming and semantic fluency) also fall in line with typical and expected disease trajectory,  further strengthening concerns. Despite Kristina Lee's stated beliefs during interview, current memory impairments unfortunately cannot be attributed to simple age-related changes. Despite reported stressors, she denied acute symptoms of anxiety and depression across related questionnaires.  PREVIOUS MEDICATIONS:   CURRENT MEDICATIONS:  Outpatient Encounter Medications as of 06/22/2022  Medication Sig   Acetaminophen (TYLENOL PO) Take by mouth as needed.   aspirin EC 81 MG tablet Take 81 mg by mouth daily.   atorvastatin (LIPITOR) 80 MG tablet Take 1 tablet (80 mg total) by mouth daily.   Calcium Carbonate-Vit D-Min (CALCIUM 600+D PLUS MINERALS) 600-400 MG-UNIT TABS Take 2 tablets by mouth daily.    clopidogrel (PLAVIX) 75 MG tablet Take 1 tablet (75 mg total) by mouth daily.   diclofenac Sodium (VOLTAREN) 1 % GEL Apply 4 g topically 4 (four) times daily as needed. (Patient not taking: Reported on 05/25/2022)   fluticasone (FLONASE) 50 MCG/ACT nasal spray Place 2 sprays into both nostrils daily as needed for allergies or rhinitis.   lisinopril (ZESTRIL) 20 MG tablet Take 1 tablet (20 mg total) by mouth daily.   memantine (NAMENDA) 10 MG tablet TAKE 1 TABLET (10 MG AT NIGHT) FOR 2 WEEKS, THEN INCREASE TO 1 TABLET (10 MG) TWICE A DAY   OVER THE COUNTER MEDICATION as needed.   polyethylene glycol (MIRALAX / GLYCOLAX) 17 g packet Take 17 g by mouth as needed. (Patient not taking: Reported on 05/25/2022)   triamcinolone cream (KENALOG) 0.1 % Apply 1 Application topically 2 (two) times daily. (Patient not taking: Reported on 05/25/2022)   No facility-administered encounter medications on file as of 06/22/2022.       06/22/2022    2:00 PM 08/01/2021    3:45 PM  MMSE - Mini Mental State Exam  Orientation to time 2 3  Orientation to Place 2 2  Registration 3 3  Attention/ Calculation 4 5  Recall 1 2  Language- name 2 objects 1 2  Language- repeat 1 1  Language- follow 3 step command 3 3  Language- read &  follow direction 1 1  Write a sentence 1 1  Copy design 1 0  Total score 20 23       No data to display          Objective:     PHYSICAL EXAMINATION:    VITALS:   Vitals:   06/22/22 1121 06/22/22 1157  BP: (!) 210/97 (!) 171/91  Pulse:  67   Resp: 18   Weight: 144 lb (65.3 kg)   Height: '5\' 1"'$  (1.549 m)     GEN:  The patient appears stated age and is in NAD. HEENT:  Normocephalic, atraumatic.   Neurological examination:  General: NAD, well-groomed, appears stated age.  Orientation: The patient is alert. Oriented to person, not to place and date Cranial nerves: There is good facial symmetry.The speech is fluent and clear but more tangential than prior visit. No aphasia or dysarthria. Fund of knowledge reduced.  Recent and remote memory are impaired. Attention and concentration are reduced.  Able to name objects but needs time to think of them, and able to repeat simple phrases.  Hearing is intact to conversational tone.   Sensation: Sensation is intact to light touch throughout Motor: Strength is at least antigravity x4. DTR's 2/4 in UE/LE     Movement examination: Tone: There is normal tone in the UE/LE Abnormal movements:  no tremor.  No myoclonus.  No asterixis.   Coordination:  There is no decremation with RAM's. Normal finger to nose  Gait and Station: The patient has no difficulty arising out of a deep-seated chair without the use of the hands. The patient's stride length is good.  Gait is cautious and narrow.    Thank you for allowing [place] the opportunity to participate in the care of this nice patient. Please do not hesitate to contact us for any questions or concerns.   Total time spent on today's visit was 27 minutes dedicated to this patient today, preparing to see patient, examining the patient, ordering tests and/or medications and counseling the patient, documenting clinical information in the EHR or other health record, independently interpreting results and  communicating results to the patient/family, discussing treatment and goals, answering patient's questions and coordinating care.  Cc:  Mosie Lukes, MD  Sharene Butters 06/22/2022 2:14 PM

## 2022-06-22 NOTE — Patient Instructions (Signed)
It was a pleasure to see you today at our office.   Recommendations:  Follow up in  6 months Feb 6 at 11:30  Continue  Memantine 10 mg twice a day.    Blood pressure is very high, so she needs better control of it   Whom to call:  Memory  decline, memory medications: Call our office [phone]   For psychiatric meds, mood meds: Please have your primary care physician manage these medications.   Counseling regarding caregiver distress, including caregiver depression, anxiety and issues regarding community resources, adult day care programs, adult living facilities, or memory care questions:   Feel free to contact [place], Social Worker at [phone]   For assessment of decision of mental capacity and competency:  Call Dr. Anthoney Harada, geriatric psychiatrist at [phone]  For guidance in geriatric dementia issues please call [facility] [phone]  For guidance regarding [facility] and if placement were needed at the facility, contact Arnell Asal, Social Worker tel: [phone]  If you have any severe symptoms of a stroke, or other severe issues such as confusion,severe chills or fever, etc call 911 or go to the ER as you may need to be evaluated further   Feel free to visit Facebook page " [email or web address]" for tips of how to care for people with memory problems.        RECOMMENDATIONS FOR ALL PATIENTS WITH MEMORY PROBLEMS: 1. Continue to exercise (Recommend 30 minutes of walking everyday, or 3 hours every week) 2. Increase social interactions - continue going to [place] and enjoy social gatherings with friends and family 3. Eat healthy, avoid fried foods and eat more fruits and vegetables 4. Maintain adequate blood pressure, blood sugar, and blood cholesterol level. Reducing the risk of stroke and cardiovascular disease also helps promoting better memory. 5. Avoid stressful situations. Live a simple life and avoid aggravations.  Organize your time and prepare for the next day in anticipation. 6. Sleep well, avoid any interruptions of sleep and avoid any distractions in the bedroom that may interfere with adequate sleep quality 7. Avoid sugar, avoid sweets as there is a strong link between excessive sugar intake, diabetes, and cognitive impairment We discussed the Mediterranean diet, which has been shown to help patients reduce the risk of progressive memory disorders and reduces cardiovascular risk. This includes eating fish, eat fruits and green leafy vegetables, nuts like almonds and hazelnuts, walnuts, and also use olive oil. Avoid fast foods and fried foods as much as possible. Avoid sweets and sugar as sugar use has been linked to worsening of memory function.  There is always a concern of gradual progression of memory problems. If this is the case, then we may need to adjust level of care according to patient needs. Support, both to the patient and caregiver, should then be put into place.    The Alzheimer's Association is here all day, every day for people facing Alzheimer's disease through our free 24/7 Helpline: [phone]. The Helpline provides reliable information and support to all those who need assistance, such as individuals living with memory loss, Alzheimer's or other dementia, caregivers, health care professionals and the public.  Our highly trained and knowledgeable staff can help you with: Understanding memory loss, dementia and Alzheimer's  Medications and other treatment options  General information about aging and brain health  Skills to provide quality care and to find the best care from professionals  Legal, financial and living-arrangement decisions Our Helpline also features:  Confidential care consultation provided by master's level clinicians who can help with decision-making support, crisis assistance and education on issues families face every day  Help in a caller's preferred language using  our translation service that features more than 200 languages and dialects  Referrals to local community programs, services and ongoing support     FALL PRECAUTIONS: Be cautious when walking. Scan the area for obstacles that may increase the risk of trips and falls. When getting up in the mornings, sit up at the edge of the bed for a few minutes before getting out of bed. Consider elevating the bed at the head end to avoid drop of blood pressure when getting up. Walk always in a well-lit room (use night lights in the walls). Avoid area rugs or power cords from appliances in the middle of the walkways. Use a walker or a cane if necessary and consider physical therapy for balance exercise. Get your eyesight checked regularly.  FINANCIAL OVERSIGHT: Supervision, especially oversight when making financial decisions or transactions is also recommended.  HOME SAFETY: Consider the safety of the kitchen when operating appliances like stoves, microwave oven, and blender. Consider having supervision and share cooking responsibilities until no longer able to participate in those. Accidents with firearms and other hazards in the house should be identified and addressed as well.   ABILITY TO BE LEFT ALONE: If patient is unable to contact 911 operator, consider using LifeLine, or when the need is there, arrange for someone to stay with patients. Smoking is a fire hazard, consider supervision or cessation. Risk of wandering should be assessed by caregiver and if detected at any point, supervision and safe proof recommendations should be instituted.  MEDICATION SUPERVISION: Inability to self-administer medication needs to be constantly addressed. Implement a mechanism to ensure safe administration of the medications.   DRIVING: Regarding driving, in patients with progressive memory problems, driving will be impaired. We advise to have someone else do the driving if trouble finding directions or if minor accidents  are reported. Independent driving assessment is available to determine safety of driving.   If you are interested in the driving assessment, you can contact the following:  [employer] in [place]  Menominee Schneider [phone] or [phone]      Benoit refers to food and lifestyle choices that are based on the traditions of countries located on the [employer]. This way of eating has been shown to help prevent certain conditions and improve outcomes for people who have chronic diseases, like kidney disease and heart disease. What are tips for following this plan? Lifestyle  Cook and eat meals together with your family, when possible. Drink enough fluid to keep your urine clear or pale yellow. Be physically active every day. This includes: Aerobic exercise like running or swimming. Leisure activities like gardening, walking, or housework. Get 7-8 hours of sleep each night. If recommended by your health care provider, drink red wine in moderation. This means 1 glass a day for nonpregnant women and 2 glasses a day for men. A glass of wine equals 5 oz (150 mL). Reading food labels  Check the serving size of packaged foods. For foods such as rice and pasta, the serving size refers to the amount of cooked product, not dry. Check the total fat in packaged foods. Avoid foods that have saturated fat or trans fats. Check the ingredients list for added sugars, such  as corn syrup. Shopping  At the grocery store, buy most of your food from the areas near the walls of the store. This includes: Fresh fruits and vegetables (produce). Grains, beans, nuts, and seeds. Some of these may be available in unpackaged forms or large amounts (in bulk). Fresh seafood. Poultry and eggs. Low-fat dairy products. Buy whole ingredients instead of prepackaged  foods. Buy fresh fruits and vegetables in-season from local farmers markets. Buy frozen fruits and vegetables in resealable bags. If you do not have access to quality fresh seafood, buy precooked frozen shrimp or canned fish, such as tuna, salmon, or sardines. Buy small amounts of raw or cooked vegetables, salads, or olives from the deli or salad bar at your store. Stock your pantry so you always have certain foods on hand, such as olive oil, canned tuna, canned tomatoes, rice, pasta, and beans. Cooking  Cook foods with extra-virgin olive oil instead of using butter or other vegetable oils. Have meat as a side dish, and have vegetables or grains as your main dish. This means having meat in small portions or adding small amounts of meat to foods like pasta or stew. Use beans or vegetables instead of meat in common dishes like chili or lasagna. Experiment with different cooking methods. Try roasting or broiling vegetables instead of steaming or sauteing them. Add frozen vegetables to soups, stews, pasta, or rice. Add nuts or seeds for added healthy fat at each meal. You can add these to yogurt, salads, or vegetable dishes. Marinate fish or vegetables using olive oil, lemon juice, garlic, and fresh herbs. Meal planning  Plan to eat 1 vegetarian meal one day each week. Try to work up to 2 vegetarian meals, if possible. Eat seafood 2 or more times a week. Have healthy snacks readily available, such as: Vegetable sticks with hummus. Greek yogurt. Fruit and nut trail mix. Eat balanced meals throughout the week. This includes: Fruit: 2-3 servings a day Vegetables: 4-5 servings a day Low-fat dairy: 2 servings a day Fish, poultry, or lean meat: 1 serving a day Beans and legumes: 2 or more servings a week Nuts and seeds: 1-2 servings a day Whole grains: 6-8 servings a day Extra-virgin olive oil: 3-4 servings a day Limit red meat and sweets to only a few servings a month What are my food  choices? Mediterranean diet Recommended Grains: Whole-grain pasta. Brown rice. Bulgar wheat. Polenta. Couscous. Whole-wheat bread. [place]. Vegetables: Artichokes. Beets. Broccoli. Cabbage. Carrots. Eggplant. Green beans. Chard. Kale. Spinach. Onions. Leeks. Peas. Squash. Tomatoes. Peppers. Radishes. Fruits: Apples. Apricots. Avocado. Berries. Bananas. Cherries. Dates. Figs. Grapes. Lemons. Melon. Oranges. Peaches. Plums. Pomegranate. Meats and other protein foods: Beans. Almonds. Sunflower seeds. Pine nuts. Peanuts. [place]. Salmon. Scallops. Shrimp. [place]. Tilapia. Clams. Oysters. Eggs. Dairy: Low-fat milk. Cheese. Greek yogurt. Beverages: Water. Red wine. Herbal tea. Fats and oils: Extra virgin olive oil. Avocado oil. Grape seed oil. Sweets and desserts: [place] yogurt with honey. Baked apples. Poached pears. Trail mix. Seasoning and other foods: Basil. Cilantro. Coriander. Cumin. Mint. Parsley. Sage. Rosemary. Tarragon. Garlic. Oregano. Thyme. Pepper. Balsalmic vinegar. Tahini. Hummus. Tomato sauce. Olives. Mushrooms. Limit these Grains: Prepackaged pasta or rice dishes. Prepackaged cereal with added sugar. Vegetables: Deep fried potatoes (french fries). Fruits: Fruit canned in syrup. Meats and other protein foods: Beef. Pork. Lamb. Poultry with skin. Hot dogs. Berniece Salines. Dairy: Ice cream. Sour cream. Whole milk. Beverages: Juice. Sugar-sweetened soft drinks. Beer. Liquor and spirits. Fats and oils: Butter. Canola oil. Vegetable oil. Beef fat (tallow).  Lard. Sweets and desserts: Cookies. Cakes. Pies. Candy. Seasoning and other foods: Mayonnaise. Premade sauces and marinades. The items listed may not be a complete list. Talk with your dietitian about what dietary choices are right for you. Summary The Mediterranean diet includes both food and lifestyle choices. Eat a variety of fresh fruits and vegetables, beans, nuts, seeds, and whole grains. Limit the amount of red meat and sweets that  you eat. Talk with your health care provider about whether it is safe for you to drink red wine in moderation. This means 1 glass a day for nonpregnant women and 2 glasses a day for men. A glass of wine equals 5 oz (150 mL). This information is not intended to replace advice given to you by your health care provider. Make sure you discuss any questions you have with your health care provider. Document Released: 11/24/2015 Document Revised: 12/27/2015 Document Reviewed: 11/24/2015 Elsevier Interactive Patient Education  2017 [employer].

## 2022-06-27 ENCOUNTER — Ambulatory Visit: Payer: Medicare PPO | Admitting: Family Medicine

## 2022-06-27 ENCOUNTER — Encounter: Payer: Self-pay | Admitting: Family Medicine

## 2022-06-27 VITALS — BP 128/47 | HR 58 | Resp 18 | Ht 61.0 in | Wt 141.0 lb

## 2022-06-27 DIAGNOSIS — I10 Essential (primary) hypertension: Secondary | ICD-10-CM | POA: Diagnosis not present

## 2022-06-27 NOTE — Assessment & Plan Note (Signed)
Your blood pressure was normal on recheck. Ensure you are taking your medications as prescribed, eating a balanced diet, staying well hydrated, minimizing sodium intake, and getting regular physical activity.   - BP goal <130/80 - monitor and log blood pressures at home a few times per week if possible - check around the same time each day in a relaxed setting - Limit salt to <2000 mg/day - Follow DASH eating plan (heart healthy diet) - limit alcohol to 2 standard drinks per day for men and 1 per day for women - avoid tobacco products - get at least 2 hours of regular aerobic exercise weekly Patient aware of signs/symptoms requiring further/urgent evaluation.

## 2022-06-27 NOTE — Patient Instructions (Signed)
Good seeing you today!   Your blood pressure was normal on recheck. Ensure you are taking your medications as prescribed, eating a balanced diet, staying well hydrated, minimizing sodium intake, and getting regular physical activity.   - BP goal <130/80 - monitor and log blood pressures at home a few times per week if possible - check around the same time each day in a relaxed setting - Limit salt to <2000 mg/day - Follow DASH eating plan (heart healthy diet) - limit alcohol to 2 standard drinks per day for men and 1 per day for women - avoid tobacco products - get at least 2 hours of regular aerobic exercise weekly Patient aware of signs/symptoms requiring further/urgent evaluation.

## 2022-06-27 NOTE — Progress Notes (Signed)
Acute Office Visit  Subjective:     Patient ID: Kristina Lee, female    DOB: 12/22/1945, 77 y.o.   MRN: [id number]  Chief Complaint  Patient presents with   Follow-up    HPI Patient is in today for elevated blood pressure reading at recent appointment. She is a poor historian, scattered thoughts. She is here alone, states her husband is waiting in the lobby.   At her neurology appointment on 06/22/22, her BP was quite high and they were questioning medication compliance (171/91, 210/97). They are evaluating her for memory concerns, possible Alzheimer's.  States she was not feeling bad at the time.   She most recently saw her cardiologist Dr. Gwenlyn Found on 05/25/22 and BP was stable at 128/76, she was encouraged to continue her lisinopril along with lifestyle measures.    Hypertension: - Medications: lisinopril 20 mg daily - Compliance: good per patient report, poor historian  - Checking BP at home: no - Denies any SOB, recurrent headaches, CP, vision changes, LE edema, dizziness, palpitations, or medication side effects. - Diet: general; reports her husband cooks a lot, does not know about salt intake - Exercise: physical therapy, walking     ROS All review of systems negative except what is listed in the HPI      Objective:    BP (!) 128/47 (BP Location: Left Arm, Patient Position: Sitting, Cuff Size: Normal)   Pulse (!) 58   Resp 18   Ht '5\' 1"'$  (1.549 m)   Wt 141 lb (64 kg)   LMP  (LMP Unknown)   SpO2 99%   BMI 26.64 kg/m    Physical Exam Vitals reviewed.  Constitutional:      Appearance: Normal appearance.  Cardiovascular:     Rate and Rhythm: Normal rate and regular rhythm.     Pulses: Normal pulses.     Heart sounds: Normal heart sounds.  Pulmonary:     Effort: Pulmonary effort is normal.     Breath sounds: Normal breath sounds.  Musculoskeletal:     Right lower leg: No edema.     Left lower leg: No edema.  Skin:    General: Skin is warm and dry.   Neurological:     Mental Status: She is alert and oriented to person, place, and time.  Psychiatric:        Mood and Affect: Mood normal.        Behavior: Behavior normal.        Thought Content: Thought content normal.        Judgment: Judgment normal.       No results found for any visits on 06/27/22.      Assessment & Plan:   Problem List Items Addressed This Visit       Cardiovascular and Mediastinum   Essential hypertension - Primary    Your blood pressure was normal on recheck. Ensure you are taking your medications as prescribed, eating a balanced diet, staying well hydrated, minimizing sodium intake, and getting regular physical activity.   - BP goal <130/80 - monitor and log blood pressures at home a few times per week if possible - check around the same time each day in a relaxed setting - Limit salt to <2000 mg/day - Follow DASH eating plan (heart healthy diet) - limit alcohol to 2 standard drinks per day for men and 1 per day for women - avoid tobacco products - get at least 2 hours of regular aerobic exercise weekly  Patient aware of signs/symptoms requiring further/urgent evaluation.       No orders of the defined types were placed in this encounter.   Return for routine follow-up, 1-2 months with PCP.  Terrilyn Saver, NP

## 2022-06-29 DIAGNOSIS — Z789 Other specified health status: Secondary | ICD-10-CM | POA: Diagnosis not present

## 2022-06-29 DIAGNOSIS — M542 Cervicalgia: Secondary | ICD-10-CM | POA: Diagnosis not present

## 2022-06-29 DIAGNOSIS — R29898 Other symptoms and signs involving the musculoskeletal system: Secondary | ICD-10-CM | POA: Diagnosis not present

## 2022-06-29 DIAGNOSIS — M25619 Stiffness of unspecified shoulder, not elsewhere classified: Secondary | ICD-10-CM | POA: Diagnosis not present

## 2022-06-29 DIAGNOSIS — Z7409 Other reduced mobility: Secondary | ICD-10-CM | POA: Diagnosis not present

## 2022-07-06 DIAGNOSIS — Z7409 Other reduced mobility: Secondary | ICD-10-CM | POA: Diagnosis not present

## 2022-07-06 DIAGNOSIS — Z789 Other specified health status: Secondary | ICD-10-CM | POA: Diagnosis not present

## 2022-07-06 DIAGNOSIS — M25619 Stiffness of unspecified shoulder, not elsewhere classified: Secondary | ICD-10-CM | POA: Diagnosis not present

## 2022-07-06 DIAGNOSIS — M542 Cervicalgia: Secondary | ICD-10-CM | POA: Diagnosis not present

## 2022-07-06 DIAGNOSIS — R29898 Other symptoms and signs involving the musculoskeletal system: Secondary | ICD-10-CM | POA: Diagnosis not present

## 2022-07-07 ENCOUNTER — Encounter: Payer: Self-pay | Admitting: Family Medicine

## 2022-07-09 ENCOUNTER — Ambulatory Visit: Payer: Medicare PPO | Admitting: Family Medicine

## 2022-07-09 ENCOUNTER — Other Ambulatory Visit: Payer: Self-pay

## 2022-07-09 ENCOUNTER — Ambulatory Visit (HOSPITAL_BASED_OUTPATIENT_CLINIC_OR_DEPARTMENT_OTHER)
Admission: RE | Admit: 2022-07-09 | Discharge: 2022-07-09 | Disposition: A | Discharge status: Home / Self Care | Payer: Medicare PPO | Source: Ambulatory Visit | Attending: Family Medicine | Admitting: Family Medicine

## 2022-07-09 VITALS — BP 120/80 | HR 70 | Temp 98.6°F | Ht 61.0 in | Wt 142.0 lb

## 2022-07-09 DIAGNOSIS — S92901A Unspecified fracture of right foot, initial encounter for closed fracture: Secondary | ICD-10-CM | POA: Diagnosis not present

## 2022-07-09 DIAGNOSIS — M79671 Pain in right foot: Secondary | ICD-10-CM

## 2022-07-09 DIAGNOSIS — M7989 Other specified soft tissue disorders: Secondary | ICD-10-CM | POA: Diagnosis not present

## 2022-07-09 MED ORDER — ATORVASTATIN CALCIUM 80 MG PO TABS: 80.0000 mg | ORAL_TABLET | Freq: Every day | ORAL | 1 refills | Status: DC

## 2022-07-09 NOTE — Addendum Note (Signed)
Addended by: Ames Coupe on: 07/09/2022 05:04 PM   Modules accepted: Orders

## 2022-07-09 NOTE — Progress Notes (Signed)
Musculoskeletal Exam  Patient: Kristina Lee DOB: [birth year]  DOS: 07/09/2022  SUBJECTIVE:  Chief Complaint:   Chief Complaint  Patient presents with   Lytle Michaels    Kristina Lee is a 77 y.o.  female for evaluation and treatment of foot pain. Here w her spouse who helps w the hx.   Onset:  1 day ago. Tripped over a parking curb Location: top of L foot Character:  aching and sharp  Progression of issue:  is unchanged Associated symptoms: swelling Treatment: to date has been rest, Tylenol.   Neurovascular symptoms: no  Past Medical History:  Diagnosis Date   Adjustment disorder with depressed mood 03/14/2021   Allergic rhinitis 07/14/2014   Allergic urticaria 12/23/2017   CAD (coronary artery disease) 09/25/2017   Last Assessment & Plan:  History of CAD status post non-STEMI 09/02/2017 at [facility] in [place].  She underwent proximal LAD stenting via a radial approach.  EF was 45 to 50% she is on dual and type of therapy with aspirin and Brilinta.  We will recheck a 2D echo for LV fu   Chronic kidney disease, stage III (moderate) 05/02/2015   DDD (degenerative disc disease), lumbosacral 09/13/2015   Noted during 09/08/15 ed visit (L4-L5 and L5-S1) Noted during 09/08/15 ed visit (L4-L5 and L5-S1)  Formatting of this note might be different from the original. Overview:  Noted during 09/08/15 ed visit (L4-L5 and L5-S1) Formatting of this note might be different from the original. Noted during 09/08/15 ed visit (L4-L5 and L5-S1)   Diverticulosis of large intestine 09/13/2015   Incidentally found, extensive, entire colon found on ED visit on CT scan 09/08/15 Incidentally found, extensive, entire colon found on ED visit on CT scan 09/08/15  Formatting of this note might be different from the original. Overview:  Incidentally found, extensive, entire colon found on ED visit on CT scan 09/08/15 Formatting of this note might be different from the original. Incidentally found, e    Essential hypertension 07/14/2014   Gastroesophageal reflux disease with esophagitis 09/04/2017   Hearing loss 08/10/2018   History of oral lesions 10/09/2019   History of uterine cancer 07/14/2014   s/p complete hysterectomy and B oophorectomy at [facility] no radiation. s/p complete hysterectomy and B oophorectomy at [facility] no radiation.  Formatting of this note might be different from the original. Overview:  s/p complete hysterectomy and B oophorectomy at [facility] no radiation. Formatting of this note might be different from the original. s/p complete hysterectomy and B oophorectomy at [place] no radiati   Hyperglycemia 09/29/2020   Hyperlipidemia associated with type 2 diabetes mellitus 12/31/2016   Hypokalemia 10/28/2015   Intrinsic atopic dermatitis 12/23/2017   Low back pain 09/25/2017   Mild cognitive impairment with memory loss 10/05/2021   Myalgia 02/09/2018   Myocardial infarction 12/23/2017   Nuclear sclerotic cataract of left eye 11/09/2020   Otitis externa 08/10/2018   Seborrheic dermatitis of scalp 09/13/2015   Suspected   Tinnitus of left ear 09/29/2020   Type II diabetes mellitus 12/23/2017   Urticaria    Varicose veins of right lower extremity with complications [id number]    Objective: VITAL SIGNS: BP 120/80 (BP Location: Left Arm, Patient Position: Sitting, Cuff Size: Normal)   Pulse 70   Temp 98.6 F (37 C) (Oral)   Ht 5\' 1"  (1.549 m)   Wt 142 lb (64.4 kg)   LMP  (LMP Unknown)   SpO2 97%   BMI 26.83 kg/m  Constitutional: Well formed, well  developed. No acute distress. Thorax & Lungs: No accessory muscle use Musculoskeletal: Right foot.   Tenderness to palpation: Yes over the distal metatarsals 2 through 4 Deformity: no bony deformity, soft tissue swelling noted Ecchymosis: no Tests positive: None Tests negative: Squeeze Neurologic: Normal sensory function.  Psychiatric: Normal mood/affect. Limited judgment/insight.   Assessment:  Right foot pain - Plan: DG Foot  Complete Right  Plan: Flat soled shoe, ice, Tylenol.  Check x-ray.  Follow-up disposition based off of these results. F/u prn. The patient and her spouse voiced understanding and agreement to the plan.   [place], DO 07/09/22  4:34 PM

## 2022-07-09 NOTE — Patient Instructions (Addendum)
Ice/cold pack over area for 10-15 min twice daily.  OK to take Tylenol 1000 mg (2 extra strength tabs) or 975 mg (3 regular strength tabs) every 6 hours as needed.  We will be in touch regarding your X-ray results.   Wear the flat soled shoe when you are walking.   Let us know if you need anything.

## 2022-07-09 NOTE — Telephone Encounter (Signed)
Medication sent.

## 2022-07-12 ENCOUNTER — Telehealth: Payer: Self-pay | Admitting: Family Medicine

## 2022-07-12 NOTE — Telephone Encounter (Signed)
Pt's husband called to follow up on imaging results for her foot and was told they should hear back by yesterday but haven't gotten a call yet. Results came back late last night but has not been reviewed by provider she saw and that provider is not in the office. Please review and notify patient of results.

## 2022-07-12 NOTE — Telephone Encounter (Signed)
Called pt lvm  and sent pt mychart message regarding her foot broken  in 2 places and should hear from specialist soon.

## 2022-07-12 NOTE — Telephone Encounter (Signed)
Results official today. She broke her foot in 2 places, not just the spot I saw. No changes in getting her set up with a specialist.

## 2022-07-19 ENCOUNTER — Ambulatory Visit: Payer: Medicare PPO | Admitting: Physician Assistant

## 2022-07-19 ENCOUNTER — Other Ambulatory Visit (INDEPENDENT_AMBULATORY_CARE_PROVIDER_SITE_OTHER): Payer: Medicare PPO

## 2022-07-19 DIAGNOSIS — M79671 Pain in right foot: Secondary | ICD-10-CM | POA: Diagnosis not present

## 2022-07-19 DIAGNOSIS — M25551 Pain in right hip: Secondary | ICD-10-CM | POA: Diagnosis not present

## 2022-07-19 NOTE — Progress Notes (Signed)
Office Visit Note   Patient: Kristina Lee           Date of Birth: February 21, 1946           MRN: [id number] Visit Date: 07/19/2022              Requested by: Shelda Pal, [address] [place],  [place] [address] PCP: Mosie Lukes, MD   Assessment & Plan: Visit Diagnoses:  1. Pain of right hip   2. Right foot pain     Plan: Impression is right foot fifth metatarsal base fracture/talus fracture and right lateral hip hematoma.  In regards to the foot, I have discussed placing her in a postop shoe weightbearing as tolerated.  Ice and elevate for pain and swelling.  In regards to the hip, I am not concerned for fracture at this point.  I have recommended ice and heat.  She will follow-up with [place] in 3 weeks for repeat evaluation and three-view x-rays of the right foot.  Call with concerns or questions.  Follow-Up Instructions: Return in about 3 weeks (around 08/09/2022).   Orders:  Orders Placed This Encounter  Procedures   XR HIP UNILAT W OR W/O PELVIS 2-3 VIEWS RIGHT   No orders of the defined types were placed in this encounter.     Procedures: No procedures performed   Clinical Data: No additional findings.   Subjective: Chief Complaint  Patient presents with   Right Foot - Fracture   Right Hip - Pain    HPI patient is a pleasant 77 year old female who comes in today with right foot and right hip pain.  This been about a week and a half ago 07/09/2022 when she sustained a mechanical fall onto her right side.  She was seen where x-rays were obtained.  X-rays demonstrated fifth metatarsal fracture.  Hip x-rays were not obtained at that time.  She is here today for follow-up of her right foot and right hip.  In regards to her right hip, she is having pain going from a seated to standing position.  Foot pain is worse with ambulation.  She was initially wearing a boot but is stopped wearing this as the pain has subsided.  She is not currently  taking anything for pain.  Review of Systems as detailed in.  All others reviewed and are negative.   Objective: Vital Signs: LMP  (LMP Unknown)   Physical Exam well-developed well-nourished female in no acute distress.  Alert and oriented x 3.  Ortho Exam right hip exam reveals moderate ecchymosis throughout the lateral hip.  Painless hip flexion and logroll.  Right foot exam reveals moderate ecchymosis.  Moderate tenderness to the proximal fifth metatarsal and throughout the dorsum of the foot.  She is neurovascularly intact distally.  Specialty Comments:  No specialty comments available.  Imaging: XR HIP UNILAT W OR W/O PELVIS 2-3 VIEWS RIGHT  Result Date: 07/19/2022 No acute or structural abnormalities    PMFS History: Patient Active Problem List   Diagnosis Date Noted   Bilateral shoulder pain 03/21/2022   Breast pain, right 03/21/2022   Perioral dermatitis 12/13/2021   Mild cognitive impairment with memory loss 10/05/2021   Adjustment disorder with depressed mood 03/14/2021   Nuclear sclerotic cataract of left eye 11/09/2020   Post-menopause 09/30/2020   Constipation 09/29/2020   Tinnitus of left ear 09/29/2020   History of oral lesions 10/09/2019   Right arm pain 07/15/2019  Hair loss 08/10/2018   Hearing loss 08/10/2018   Otitis externa 08/10/2018   Varicose veins of right lower extremity with complications [id number]   Myalgia 02/09/2018   Allergic urticaria 12/23/2017   Myocardial infarction 12/23/2017   Type II diabetes mellitus 12/23/2017   Intrinsic atopic dermatitis 12/23/2017   CAD (coronary artery disease) 09/25/2017   Low back pain 09/25/2017   Gastroesophageal reflux disease with esophagitis 09/04/2017   Hyperlipidemia associated with type 2 diabetes mellitus 12/31/2016   Hypokalemia 10/28/2015   DDD (degenerative disc disease), lumbosacral 09/13/2015   Diverticulosis of large intestine 09/13/2015   Seborrheic dermatitis of scalp 09/13/2015    Chronic kidney disease, stage III (moderate) 05/02/2015   Allergic rhinitis 07/14/2014   Essential hypertension 07/14/2014   History of uterine cancer 07/14/2014   Past Medical History:  Diagnosis Date   Adjustment disorder with depressed mood 03/14/2021   Allergic rhinitis 07/14/2014   Allergic urticaria 12/23/2017   CAD (coronary artery disease) 09/25/2017   Last Assessment & Plan:  History of CAD status post non-STEMI 09/02/2017 at [facility] in [place].  She underwent proximal LAD stenting via a radial approach.  EF was 45 to 50% she is on dual and type of therapy with aspirin and Brilinta.  We will recheck a 2D echo for LV fu   Chronic kidney disease, stage III (moderate) 05/02/2015   DDD (degenerative disc disease), lumbosacral 09/13/2015   Noted during 09/08/15 ed visit (L4-L5 and L5-S1) Noted during 09/08/15 ed visit (L4-L5 and L5-S1)  Formatting of this note might be different from the original. Overview:  Noted during 09/08/15 ed visit (L4-L5 and L5-S1) Formatting of this note might be different from the original. Noted during 09/08/15 ed visit (L4-L5 and L5-S1)   Diverticulosis of large intestine 09/13/2015   Incidentally found, extensive, entire colon found on ED visit on CT scan 09/08/15 Incidentally found, extensive, entire colon found on ED visit on CT scan 09/08/15  Formatting of this note might be different from the original. Overview:  Incidentally found, extensive, entire colon found on ED visit on CT scan 09/08/15 Formatting of this note might be different from the original. Incidentally found, e   Essential hypertension 07/14/2014   Gastroesophageal reflux disease with esophagitis 09/04/2017   Hearing loss 08/10/2018   History of oral lesions 10/09/2019   History of uterine cancer 07/14/2014   s/p complete hysterectomy and B oophorectomy at [facility] no radiation. s/p complete hysterectomy and B oophorectomy at [facility] no radiation.  Formatting of this note might be different  from the original. Overview:  s/p complete hysterectomy and B oophorectomy at [facility] no radiation. Formatting of this note might be different from the original. s/p complete hysterectomy and B oophorectomy at [facility] no radiati   Hyperglycemia 09/29/2020   Hyperlipidemia associated with type 2 diabetes mellitus 12/31/2016   Hypokalemia 10/28/2015   Intrinsic atopic dermatitis 12/23/2017   Low back pain 09/25/2017   Mild cognitive impairment with memory loss 10/05/2021   Myalgia 02/09/2018   Myocardial infarction 12/23/2017   Nuclear sclerotic cataract of left eye 11/09/2020   Otitis externa 08/10/2018   Seborrheic dermatitis of scalp 09/13/2015   Suspected   Tinnitus of left ear 09/29/2020   Type II diabetes mellitus 12/23/2017   Urticaria    Varicose veins of right lower extremity with complications 123XX123    Family History  Problem Relation Age of Onset   Cancer Mother        liver cancer, breast cancer x 2  Alcohol abuse Mother        smoker   Heart disease Father        MI   Alcohol abuse Father        smoker   Depression Sister    Tremor Sister    Dementia Sister    Cancer Brother        pancreatic   Diabetes Maternal Grandmother    Hearing loss Maternal Grandmother    Depression Maternal Grandmother    Healthy Son    Allergic rhinitis Neg Hx    Angioedema Neg Hx    Asthma Neg Hx    Eczema Neg Hx    Immunodeficiency Neg Hx    Urticaria Neg Hx     Past Surgical History:  Procedure Laterality Date   ABDOMINAL HYSTERECTOMY  2006   b/l SPO and TAH, uterine cancer   EYE SURGERY Right 1960   TUBAL LIGATION     Social History   Occupational History   Occupation: Retired    Comment: Pharmacist, hospital  Tobacco Use   Smoking status: Former    Packs/day: 0.50    Years: 24.00    Additional pack years: 0.00    Total pack years: 12.00    Types: Cigarettes    Quit date: 04/17/1987    Years since quitting: 35.2   Smokeless tobacco: Never  Vaping Use   Vaping Use: Never  used  Substance and Sexual Activity   Alcohol use: Yes    Comment: occ   Drug use: Not Currently   Sexual activity: Yes

## 2022-08-26 ENCOUNTER — Other Ambulatory Visit: Payer: Self-pay | Admitting: Family Medicine

## 2022-08-28 DIAGNOSIS — J069 Acute upper respiratory infection, unspecified: Secondary | ICD-10-CM | POA: Diagnosis not present

## 2022-08-29 ENCOUNTER — Other Ambulatory Visit: Payer: Self-pay | Admitting: Family Medicine

## 2022-09-01 DIAGNOSIS — J209 Acute bronchitis, unspecified: Secondary | ICD-10-CM | POA: Diagnosis not present

## 2022-09-02 ENCOUNTER — Encounter: Payer: Self-pay | Admitting: Family Medicine

## 2022-09-03 ENCOUNTER — Telehealth: Payer: Self-pay | Admitting: Physician Assistant

## 2022-09-03 NOTE — Telephone Encounter (Signed)
Paitents husband called, they need a refill on judys memantine 10mg . Would like a call back to discuss

## 2022-09-03 NOTE — Telephone Encounter (Signed)
No answer at 4:20 09/03/2022

## 2022-09-04 ENCOUNTER — Ambulatory Visit: Payer: Medicare PPO | Admitting: Family Medicine

## 2022-09-04 ENCOUNTER — Telehealth: Payer: Self-pay

## 2022-09-04 ENCOUNTER — Other Ambulatory Visit: Payer: Self-pay

## 2022-09-04 ENCOUNTER — Encounter: Payer: Self-pay | Admitting: Family Medicine

## 2022-09-04 ENCOUNTER — Other Ambulatory Visit: Payer: Self-pay | Admitting: Family Medicine

## 2022-09-04 VITALS — BP 138/78 | HR 69 | Temp 98.5°F | Resp 18 | Ht 61.0 in | Wt 136.0 lb

## 2022-09-04 DIAGNOSIS — L219 Seborrheic dermatitis, unspecified: Secondary | ICD-10-CM | POA: Diagnosis not present

## 2022-09-04 DIAGNOSIS — J40 Bronchitis, not specified as acute or chronic: Secondary | ICD-10-CM | POA: Diagnosis not present

## 2022-09-04 DIAGNOSIS — J0140 Acute pansinusitis, unspecified: Secondary | ICD-10-CM | POA: Insufficient documentation

## 2022-09-04 DIAGNOSIS — J301 Allergic rhinitis due to pollen: Secondary | ICD-10-CM

## 2022-09-04 MED ORDER — AMOXICILLIN-POT CLAVULANATE 875-125 MG PO TABS: 1.0000 | ORAL_TABLET | Freq: Two times a day (BID) | ORAL | 0 refills | Status: DC

## 2022-09-04 MED ORDER — FLUTICASONE PROPIONATE 50 MCG/ACT NA SUSP: 2.0000 | Freq: Every day | NASAL | 1 refills | Status: DC | PRN

## 2022-09-04 MED ORDER — CICLOPIROX 1 % EX SHAM: MEDICATED_SHAMPOO | CUTANEOUS | 0 refills | Status: DC

## 2022-09-04 MED ORDER — METHYLPREDNISOLONE ACETATE 80 MG/ML IJ SUSP
80.0000 mg | Freq: Once | INTRAMUSCULAR | Status: AC
  Administered 2022-09-04: 80 mg via INTRAMUSCULAR

## 2022-09-04 MED ORDER — MEMANTINE HCL 10 MG PO TABS: ORAL_TABLET | ORAL | 0 refills | Status: DC

## 2022-09-04 MED ORDER — PROMETHAZINE-DM 6.25-15 MG/5ML PO SYRP: 5.0000 mL | ORAL_SOLUTION | Freq: Four times a day (QID) | ORAL | 0 refills | Status: DC | PRN

## 2022-09-04 NOTE — Patient Instructions (Signed)

## 2022-09-04 NOTE — Telephone Encounter (Signed)
Left message at 1030 09/04/2022

## 2022-09-04 NOTE — Progress Notes (Signed)
Established Patient Office Visit  Subjective   Patient ID: Kristina Lee, female    DOB: 10-25-1945  Age: 77 y.o. MRN: [id number]  Chief Complaint  Patient presents with   Cough    X3 weeks, productive cough. Pt has been to the UC twice this month    HPI Pt is 77 yo with hx alz dementia here with her husband c/o cough that is productive x 3-4 weeks.  She has been to urgent care 2 x ---- cxr normal She has had z pak and pred and is only slightly better.  They are getting ready to move back to [facility] where pt will be placed in memory care. +st , no fever/ chills  Patient Active Problem List   Diagnosis Date Noted   Bronchitis 09/04/2022   Acute non-recurrent pansinusitis 09/04/2022   Bilateral shoulder pain 03/21/2022   Breast pain, right 03/21/2022   Perioral dermatitis 12/13/2021   Mild cognitive impairment with memory loss 10/05/2021   Adjustment disorder with depressed mood 03/14/2021   Nuclear sclerotic cataract of left eye 11/09/2020   Post-menopause 09/30/2020   Constipation 09/29/2020   Tinnitus of left ear 09/29/2020   History of oral lesions 10/09/2019   Right arm pain 07/15/2019   Hair loss 08/10/2018   Hearing loss 08/10/2018   Otitis externa 08/10/2018   Varicose veins of right lower extremity with complications 05/08/2018   Myalgia 02/09/2018   Allergic urticaria 12/23/2017   Myocardial infarction 12/23/2017   Type II diabetes mellitus 12/23/2017   Intrinsic atopic dermatitis 12/23/2017   CAD (coronary artery disease) 09/25/2017   Low back pain 09/25/2017   Gastroesophageal reflux disease with esophagitis 09/04/2017   Hyperlipidemia associated with type 2 diabetes mellitus 12/31/2016   Hypokalemia 10/28/2015   DDD (degenerative disc disease), lumbosacral 09/13/2015   Diverticulosis of large intestine 09/13/2015   Seborrheic dermatitis of scalp 09/13/2015   Chronic kidney disease, stage III (moderate) 05/02/2015   Allergic rhinitis 07/14/2014    Essential hypertension 07/14/2014   History of uterine cancer 07/14/2014   Past Medical History:  Diagnosis Date   Adjustment disorder with depressed mood 03/14/2021   Allergic rhinitis 07/14/2014   Allergic urticaria 12/23/2017   CAD (coronary artery disease) 09/25/2017   Last Assessment & Plan:  History of CAD status post non-STEMI 09/02/2017 at [facility] in [place].  She underwent proximal LAD stenting via a radial approach.  EF was 45 to 50% she is on dual and type of therapy with aspirin and Brilinta.  We will recheck a 2D echo for LV fu   Chronic kidney disease, stage III (moderate) 05/02/2015   DDD (degenerative disc disease), lumbosacral 09/13/2015   Noted during 09/08/15 ed visit (L4-L5 and L5-S1) Noted during 09/08/15 ed visit (L4-L5 and L5-S1)  Formatting of this note might be different from the original. Overview:  Noted during 09/08/15 ed visit (L4-L5 and L5-S1) Formatting of this note might be different from the original. Noted during 09/08/15 ed visit (L4-L5 and L5-S1)   Diverticulosis of large intestine 09/13/2015   Incidentally found, extensive, entire colon found on ED visit on CT scan 09/08/15 Incidentally found, extensive, entire colon found on ED visit on CT scan 09/08/15  Formatting of this note might be different from the original. Overview:  Incidentally found, extensive, entire colon found on ED visit on CT scan 09/08/15 Formatting of this note might be different from the original. Incidentally found, e   Essential hypertension 07/14/2014   Gastroesophageal reflux disease with  esophagitis 09/04/2017   Hearing loss 08/10/2018   History of oral lesions 10/09/2019   History of uterine cancer 07/14/2014   s/p complete hysterectomy and B oophorectomy at [facility] no radiation. s/p complete hysterectomy and B oophorectomy at [facility] no radiation.  Formatting of this note might be different from the original. Overview:  s/p complete hysterectomy and B oophorectomy at [facility] no  radiation. Formatting of this note might be different from the original. s/p complete hysterectomy and B oophorectomy at [facility] no radiati   Hyperglycemia 09/29/2020   Hyperlipidemia associated with type 2 diabetes mellitus 12/31/2016   Hypokalemia 10/28/2015   Intrinsic atopic dermatitis 12/23/2017   Low back pain 09/25/2017   Mild cognitive impairment with memory loss 10/05/2021   Myalgia 02/09/2018   Myocardial infarction 12/23/2017   Nuclear sclerotic cataract of left eye 11/09/2020   Otitis externa 08/10/2018   Seborrheic dermatitis of scalp 09/13/2015   Suspected   Tinnitus of left ear 09/29/2020   Type II diabetes mellitus 12/23/2017   Urticaria    Varicose veins of right lower extremity with complications 05/08/2018   Past Surgical History:  Procedure Laterality Date   ABDOMINAL HYSTERECTOMY  2006   b/l SPO and TAH, uterine cancer   EYE SURGERY Right 1960   TUBAL LIGATION     Social History   Tobacco Use   Smoking status: Former    Packs/day: 0.50    Years: 24.00    Additional pack years: 0.00    Total pack years: 12.00    Types: Cigarettes    Quit date: 04/17/1987    Years since quitting: 35.4   Smokeless tobacco: Never  Vaping Use   Vaping Use: Never used  Substance Use Topics   Alcohol use: Yes    Comment: occ   Drug use: Not Currently   Social History   Socioeconomic History   Marital status: Married    Spouse name: Not on file   Number of children: 3   Years of education: 18   Highest education level: Master's degree (e.g., MA, MS, MEng, MEd, MSW, MBA)  Occupational History   Occupation: Retired    Comment: Runner, broadcasting/film/video  Tobacco Use   Smoking status: Former    Packs/day: 0.50    Years: 24.00    Additional pack years: 0.00    Total pack years: 12.00    Types: Cigarettes    Quit date: 04/17/1987    Years since quitting: 35.4   Smokeless tobacco: Never  Vaping Use   Vaping Use: Never used  Substance and Sexual Activity   Alcohol use: Yes    Comment:  occ   Drug use: Not Currently   Sexual activity: Yes  Other Topics Concern   Not on file  Social History Narrative   Right handed   Drinks caffeine   2 story home   Social Determinants of Health   Financial Resource Strain: Low Risk  (07/09/2022)   Overall Financial Resource Strain (CARDIA)    Difficulty of Paying Living Expenses: Not hard at all  Food Insecurity: No Food Insecurity (07/09/2022)   Hunger Vital Sign    Worried About Running Out of Food in the Last Year: Never true    Ran Out of Food in the Last Year: Never true  Transportation Needs: No Transportation Needs (07/09/2022)   PRAPARE - Administrator, Civil Service (Medical): No    Lack of Transportation (Non-Medical): No  Physical Activity: Unknown (07/09/2022)   Exercise Vital Sign  Days of Exercise per Week: 0 days    Minutes of Exercise per Session: Not on file  Stress: No Stress Concern Present (07/09/2022)   [employer] of Occupational Health - Occupational Stress Questionnaire    Feeling of Stress : Only a little  Social Connections: Socially Integrated (07/09/2022)   Social Connection and Isolation Panel [NHANES]    Frequency of Communication with Friends and Family: More than three times a week    Frequency of Social Gatherings with Friends and Family: Once a week    Attends Religious Services: More than 4 times per year    Active Member of [employer] or Organizations: Yes    Attends Banker Meetings: More than 4 times per year    Marital Status: Married  Catering manager Violence: Not At Risk (09/29/2020)   Humiliation, Afraid, Rape, and Kick questionnaire    Fear of Current or Ex-Partner: No    Emotionally Abused: No    Physically Abused: No    Sexually Abused: No   Family Status  Relation Name Status   Mother 89 Deceased   Father 66 Deceased   Sister 65 Alive   Brother 70 Deceased   MGM 32 Deceased   MGF 60 Deceased   PGM 61s Deceased   PGF 26s Deceased   Son 61 Alive,  age 33y       Has 4 children, sometimes visit pt at her home and overwhelm her   Neg Hx  (Not Specified)   Family History  Problem Relation Age of Onset   Cancer Mother        liver cancer, breast cancer x 2    Alcohol abuse Mother        smoker   Heart disease Father        MI   Alcohol abuse Father        smoker   Depression Sister    Tremor Sister    Dementia Sister    Cancer Brother        pancreatic   Diabetes Maternal Grandmother    Hearing loss Maternal Grandmother    Depression Maternal Grandmother    Healthy Son    Allergic rhinitis Neg Hx    Angioedema Neg Hx    Asthma Neg Hx    Eczema Neg Hx    Immunodeficiency Neg Hx    Urticaria Neg Hx    Allergies  Allergen Reactions   Hydroxyzine Anaphylaxis   Sulfa Antibiotics Swelling    Rash        Review of Systems  Constitutional:  Negative for fever and malaise/fatigue.  HENT:  Negative for congestion.   Eyes:  Negative for blurred vision.  Respiratory:  Negative for shortness of breath.   Cardiovascular:  Negative for chest pain, palpitations and leg swelling.  Gastrointestinal:  Negative for abdominal pain, blood in stool and nausea.  Genitourinary:  Negative for dysuria and frequency.  Musculoskeletal:  Negative for falls.  Skin:  Negative for rash.  Neurological:  Negative for dizziness, loss of consciousness and headaches.  Endo/Heme/Allergies:  Negative for environmental allergies.  Psychiatric/Behavioral:  Negative for depression. The patient is not nervous/anxious.       Objective:     BP 138/78 (BP Location: Left Arm, Patient Position: Sitting, Cuff Size: Normal)   Pulse 69   Temp 98.5 F (36.9 C) (Oral)   Resp 18   Ht 5\' 1"  (1.549 m)   Wt 136 lb (61.7 kg)   LMP  (  LMP Unknown)   SpO2 98%   BMI 25.70 kg/m  BP Readings from Last 3 Encounters:  09/04/22 138/78  07/09/22 120/80  06/27/22 (!) 128/47   Wt Readings from Last 3 Encounters:  09/04/22 136 lb (61.7 kg)  07/09/22 142 lb  (64.4 kg)  06/27/22 141 lb (64 kg)   SpO2 Readings from Last 3 Encounters:  09/04/22 98%  07/09/22 97%  06/27/22 99%      Physical Exam Vitals and nursing note reviewed.  Constitutional:      Appearance: She is well-developed.  HENT:     Head: Normocephalic and atraumatic.     Nose: Congestion and rhinorrhea present.     Right Sinus: Maxillary sinus tenderness and frontal sinus tenderness present.     Left Sinus: Maxillary sinus tenderness and frontal sinus tenderness present.  Eyes:     Conjunctiva/sclera: Conjunctivae normal.  Neck:     Thyroid: No thyromegaly.     Vascular: No carotid bruit or JVD.  Cardiovascular:     Rate and Rhythm: Normal rate and regular rhythm.     Heart sounds: Normal heart sounds. No murmur heard. Pulmonary:     Effort: Pulmonary effort is normal. No respiratory distress.     Breath sounds: Decreased air movement present. Decreased breath sounds present. No wheezing or rales.  Chest:     Chest wall: No tenderness.  Musculoskeletal:     Cervical back: Normal range of motion and neck supple.  Lymphadenopathy:     Cervical: Cervical adenopathy present.  Neurological:     Mental Status: She is alert and oriented to person, place, and time.      No results found for any visits on 09/04/22.  Last CBC Lab Results  Component Value Date   WBC 7.7 12/28/2021   HGB 15.2 (H) 12/28/2021   HCT 45.4 12/28/2021   MCV 89.1 12/28/2021   MCH 30.2 01/22/2020   RDW 13.3 12/28/2021   PLT 185.0 12/28/2021   Last metabolic panel Lab Results  Component Value Date   GLUCOSE 120 (H) 12/28/2021   NA 143 12/28/2021   K 4.1 12/28/2021   CL 104 12/28/2021   CO2 29 12/28/2021   BUN 21 12/28/2021   CREATININE 1.00 12/28/2021   GFRNONAA 46 (L) 03/12/2018   CALCIUM 9.8 12/28/2021   PROT 6.9 12/28/2021   ALBUMIN 4.1 12/28/2021   BILITOT 1.6 (H) 12/28/2021   ALKPHOS 66 12/28/2021   AST 19 12/28/2021   ALT 17 12/28/2021   ANIONGAP 11 03/12/2018   Last  lipids Lab Results  Component Value Date   CHOL 115 12/28/2021   HDL 49.80 12/28/2021   LDLCALC 48 12/28/2021   TRIG 87.0 12/28/2021   CHOLHDL 2 12/28/2021   Last hemoglobin A1c Lab Results  Component Value Date   HGBA1C 6.7 (H) 12/28/2021   Last thyroid functions Lab Results  Component Value Date   TSH 1.36 12/28/2021   Last vitamin D No results found for: "25OHVITD2", "25OHVITD3", "VD25OH" Last vitamin B12 and Folate No results found for: "VITAMINB12", "FOLATE"    The ASCVD Risk score (Arnett DK, et al., 2019) failed to calculate for the following reasons:   The patient has a prior MI or stroke diagnosis    Assessment & Plan:   Problem List Items Addressed This Visit       Unprioritized   Seborrheic dermatitis of scalp   Relevant Medications   Ciclopirox 1 % shampoo   Bronchitis - Primary    Augmentin  875 mg bid x 10 days  Flonase and antihistamine  Depo medrol 80 mg IM       Relevant Medications   amoxicillin-clavulanate (AUGMENTIN) 875-125 MG tablet   promethazine-dextromethorphan (PROMETHAZINE-DM) 6.25-15 MG/5ML syrup   Allergic rhinitis   Relevant Medications   fluticasone (FLONASE) 50 MCG/ACT nasal spray   Acute non-recurrent pansinusitis    Augmentin 875 mg bid x 10 days  Flonase and antihistamine  Depo medrol 80 mg I'm       Relevant Medications   amoxicillin-clavulanate (AUGMENTIN) 875-125 MG tablet   fluticasone (FLONASE) 50 MCG/ACT nasal spray   promethazine-dextromethorphan (PROMETHAZINE-DM) 6.25-15 MG/5ML syrup    Return if symptoms worsen or fail to improve.    Donato Schultz, DO

## 2022-09-04 NOTE — Telephone Encounter (Signed)
Patients husband would like a call back to discuss the medication refill.

## 2022-09-04 NOTE — Telephone Encounter (Signed)
Received mychart message below from Pt's husband on his mychart. I have moved to correct chart. Please advise?

## 2022-09-04 NOTE — Telephone Encounter (Signed)
Called pt and her husband lvm to call our office needing to get  Her appt setup to be seen for the cough. I sent mychart message as well.

## 2022-09-04 NOTE — Telephone Encounter (Signed)
Appt scheduled today w/ Dr. Laury Axon.

## 2022-09-04 NOTE — Assessment & Plan Note (Signed)
Augmentin 875 mg bid x 10 days  Flonase and antihistamine  Depo medrol 80 mg IM

## 2022-09-04 NOTE — Telephone Encounter (Signed)
  I have taken Kristina Lee to the [facility] twice in the past week.first time she was prescribed something just for the cough.5 days later was prescribed Prednisone and Azithromycin 250mg .almost no relief after all this.we don't know what else to do.what do you suggest?.there's got to be some way to kick this thing.thanks!Barnett Applebaum

## 2022-09-04 NOTE — Assessment & Plan Note (Signed)
Augmentin 875 mg bid x 10 days  Flonase and antihistamine  Depo medrol 80 mg I'm

## 2022-09-07 ENCOUNTER — Other Ambulatory Visit: Payer: Self-pay

## 2022-09-14 DIAGNOSIS — R0981 Nasal congestion: Secondary | ICD-10-CM | POA: Diagnosis not present

## 2022-09-14 DIAGNOSIS — K5900 Constipation, unspecified: Secondary | ICD-10-CM | POA: Diagnosis not present

## 2022-09-14 DIAGNOSIS — E785 Hyperlipidemia, unspecified: Secondary | ICD-10-CM | POA: Diagnosis not present

## 2022-09-14 DIAGNOSIS — I10 Essential (primary) hypertension: Secondary | ICD-10-CM | POA: Diagnosis not present

## 2022-09-14 DIAGNOSIS — L989 Disorder of the skin and subcutaneous tissue, unspecified: Secondary | ICD-10-CM | POA: Diagnosis not present

## 2022-09-14 DIAGNOSIS — F03C0 Unspecified dementia, severe, without behavioral disturbance, psychotic disturbance, mood disturbance, and anxiety: Secondary | ICD-10-CM | POA: Diagnosis not present

## 2022-09-19 DIAGNOSIS — I2510 Atherosclerotic heart disease of native coronary artery without angina pectoris: Secondary | ICD-10-CM | POA: Diagnosis not present

## 2022-09-19 DIAGNOSIS — E1122 Type 2 diabetes mellitus with diabetic chronic kidney disease: Secondary | ICD-10-CM | POA: Diagnosis not present

## 2022-09-19 DIAGNOSIS — I129 Hypertensive chronic kidney disease with stage 1 through stage 4 chronic kidney disease, or unspecified chronic kidney disease: Secondary | ICD-10-CM | POA: Diagnosis not present

## 2022-09-19 DIAGNOSIS — F0390 Unspecified dementia without behavioral disturbance: Secondary | ICD-10-CM | POA: Diagnosis not present

## 2022-09-19 DIAGNOSIS — R0981 Nasal congestion: Secondary | ICD-10-CM | POA: Diagnosis not present

## 2022-09-19 DIAGNOSIS — R1310 Dysphagia, unspecified: Secondary | ICD-10-CM | POA: Diagnosis not present

## 2022-09-19 DIAGNOSIS — M5030 Other cervical disc degeneration, unspecified cervical region: Secondary | ICD-10-CM | POA: Diagnosis not present

## 2022-09-19 DIAGNOSIS — N189 Chronic kidney disease, unspecified: Secondary | ICD-10-CM | POA: Diagnosis not present

## 2022-09-19 DIAGNOSIS — I214 Non-ST elevation (NSTEMI) myocardial infarction: Secondary | ICD-10-CM | POA: Diagnosis not present

## 2022-09-19 NOTE — Assessment & Plan Note (Deleted)
hgba1c acceptable, minimize simple carbs. Increase exercise as tolerated. Continue current meds 

## 2022-09-19 NOTE — Assessment & Plan Note (Deleted)
Has had a recent exacerbation

## 2022-09-19 NOTE — Assessment & Plan Note (Deleted)
Hydrate and monitor 

## 2022-09-19 NOTE — Assessment & Plan Note (Deleted)
Well controlled, no changes to meds. Encouraged heart healthy diet such as the DASH diet and exercise as tolerated.  °

## 2022-09-19 NOTE — Assessment & Plan Note (Deleted)
They are considering a move to [place] for more support

## 2022-09-20 ENCOUNTER — Ambulatory Visit: Payer: Medicare PPO | Admitting: Family Medicine

## 2022-09-20 DIAGNOSIS — E1169 Type 2 diabetes mellitus with other specified complication: Secondary | ICD-10-CM

## 2022-09-20 DIAGNOSIS — N189 Chronic kidney disease, unspecified: Secondary | ICD-10-CM | POA: Diagnosis not present

## 2022-09-20 DIAGNOSIS — I129 Hypertensive chronic kidney disease with stage 1 through stage 4 chronic kidney disease, or unspecified chronic kidney disease: Secondary | ICD-10-CM | POA: Diagnosis not present

## 2022-09-20 DIAGNOSIS — R1310 Dysphagia, unspecified: Secondary | ICD-10-CM | POA: Diagnosis not present

## 2022-09-20 DIAGNOSIS — N183 Chronic kidney disease, stage 3 unspecified: Secondary | ICD-10-CM

## 2022-09-20 DIAGNOSIS — G3184 Mild cognitive impairment, so stated: Secondary | ICD-10-CM

## 2022-09-20 DIAGNOSIS — R0981 Nasal congestion: Secondary | ICD-10-CM | POA: Diagnosis not present

## 2022-09-20 DIAGNOSIS — F0390 Unspecified dementia without behavioral disturbance: Secondary | ICD-10-CM | POA: Diagnosis not present

## 2022-09-20 DIAGNOSIS — I2510 Atherosclerotic heart disease of native coronary artery without angina pectoris: Secondary | ICD-10-CM | POA: Diagnosis not present

## 2022-09-20 DIAGNOSIS — I10 Essential (primary) hypertension: Secondary | ICD-10-CM

## 2022-09-20 DIAGNOSIS — I214 Non-ST elevation (NSTEMI) myocardial infarction: Secondary | ICD-10-CM | POA: Diagnosis not present

## 2022-09-20 DIAGNOSIS — M5030 Other cervical disc degeneration, unspecified cervical region: Secondary | ICD-10-CM | POA: Diagnosis not present

## 2022-09-20 DIAGNOSIS — J40 Bronchitis, not specified as acute or chronic: Secondary | ICD-10-CM

## 2022-09-20 DIAGNOSIS — E1122 Type 2 diabetes mellitus with diabetic chronic kidney disease: Secondary | ICD-10-CM | POA: Diagnosis not present

## 2022-09-20 NOTE — Progress Notes (Shared)
Subjective:   By signing my name below, I, Kristina Lee, attest that this documentation has been prepared under the direction and in the presence of Bradd Canary, MD., 09/20/2022.   Patient ID: Kristina Lee, female    DOB: [birth year], 77 y.o.   MRN: [id number]  No chief complaint on file.  HPI Patient is in today for an office visit. She denies recent hospitalization, febrile illness, CP/palpitations/SOB/HA/fever/chills/GI or GU symptoms.  Past Medical History:  Diagnosis Date   Adjustment disorder with depressed mood 03/14/2021   Allergic rhinitis 07/14/2014   Allergic urticaria 12/23/2017   CAD (coronary artery disease) 09/25/2017   Last Assessment & Plan:  History of CAD status post non-STEMI 09/02/2017 at [facility] in [place].  She underwent proximal LAD stenting via a radial approach.  EF was 45 to 50% she is on dual and type of therapy with aspirin and Brilinta.  We will recheck a 2D echo for LV fu   Chronic kidney disease, stage III (moderate) 05/02/2015   DDD (degenerative disc disease), lumbosacral 09/13/2015   Noted during 09/08/15 ed visit (L4-L5 and L5-S1) Noted during 09/08/15 ed visit (L4-L5 and L5-S1)  Formatting of this note might be different from the original. Overview:  Noted during 09/08/15 ed visit (L4-L5 and L5-S1) Formatting of this note might be different from the original. Noted during 09/08/15 ed visit (L4-L5 and L5-S1)   Diverticulosis of large intestine 09/13/2015   Incidentally found, extensive, entire colon found on ED visit on CT scan 09/08/15 Incidentally found, extensive, entire colon found on ED visit on CT scan 09/08/15  Formatting of this note might be different from the original. Overview:  Incidentally found, extensive, entire colon found on ED visit on CT scan 09/08/15 Formatting of this note might be different from the original. Incidentally found, e   Essential hypertension 07/14/2014   Gastroesophageal reflux disease with esophagitis  09/04/2017   Hearing loss 08/10/2018   History of oral lesions 10/09/2019   History of uterine cancer 07/14/2014   s/p complete hysterectomy and B oophorectomy at [facility] no radiation. s/p complete hysterectomy and B oophorectomy at [facility] no radiation.  Formatting of this note might be different from the original. Overview:  s/p complete hysterectomy and B oophorectomy at [facility] no radiation. Formatting of this note might be different from the original. s/p complete hysterectomy and B oophorectomy at [facility] no radiati   Hyperglycemia 09/29/2020   Hyperlipidemia associated with type 2 diabetes mellitus 12/31/2016   Hypokalemia 10/28/2015   Intrinsic atopic dermatitis 12/23/2017   Low back pain 09/25/2017   Mild cognitive impairment with memory loss 10/05/2021   Myalgia 02/09/2018   Myocardial infarction 12/23/2017   Nuclear sclerotic cataract of left eye 11/09/2020   Otitis externa 08/10/2018   Seborrheic dermatitis of scalp 09/13/2015   Suspected   Tinnitus of left ear 09/29/2020   Type II diabetes mellitus 12/23/2017   Urticaria    Varicose veins of right lower extremity with complications 05/08/2018    Past Surgical History:  Procedure Laterality Date   ABDOMINAL HYSTERECTOMY  2006   b/l SPO and TAH, uterine cancer   EYE SURGERY Right 1960   TUBAL LIGATION      Family History  Problem Relation Age of Onset   Cancer Mother        liver cancer, breast cancer x 2    Alcohol abuse Mother        smoker   Heart disease Father  MI   Alcohol abuse Father        smoker   Depression Sister    Tremor Sister    Dementia Sister    Cancer Brother        pancreatic   Diabetes Maternal Grandmother    Hearing loss Maternal Grandmother    Depression Maternal Grandmother    Healthy Son    Allergic rhinitis Neg Hx    Angioedema Neg Hx    Asthma Neg Hx    Eczema Neg Hx    Immunodeficiency Neg Hx    Urticaria Neg Hx     Social History   Socioeconomic History   Marital status:  Married    Spouse name: Not on file   Number of children: 3   Years of education: 18   Highest education level: Master's degree (e.g., MA, MS, MEng, MEd, MSW, MBA)  Occupational History   Occupation: Retired    Comment: Runner, broadcasting/film/video  Tobacco Use   Smoking status: Former    Packs/day: 0.50    Years: 24.00    Additional pack years: 0.00    Total pack years: 12.00    Types: Cigarettes    Quit date: 04/17/1987    Years since quitting: 35.4   Smokeless tobacco: Never  Vaping Use   Vaping Use: Never used  Substance and Sexual Activity   Alcohol use: Yes    Comment: occ   Drug use: Not Currently   Sexual activity: Yes  Other Topics Concern   Not on file  Social History Narrative   Right handed   Drinks caffeine   2 story home   Social Determinants of Health   Financial Resource Strain: Low Risk  (07/09/2022)   Overall Financial Resource Strain (CARDIA)    Difficulty of Paying Living Expenses: Not hard at all  Food Insecurity: No Food Insecurity (07/09/2022)   Hunger Vital Sign    Worried About Running Out of Food in the Last Year: Never true    Ran Out of Food in the Last Year: Never true  Transportation Needs: No Transportation Needs (07/09/2022)   PRAPARE - Administrator, Civil Service (Medical): No    Lack of Transportation (Non-Medical): No  Physical Activity: Unknown (07/09/2022)   Exercise Vital Sign    Days of Exercise per Week: 0 days    Minutes of Exercise per Session: Not on file  Stress: No Stress Concern Present (07/09/2022)   [employer] of Occupational Health - Occupational Stress Questionnaire    Feeling of Stress : Only a little  Social Connections: Socially Integrated (07/09/2022)   Social Connection and Isolation Panel [NHANES]    Frequency of Communication with Friends and Family: More than three times a week    Frequency of Social Gatherings with Friends and Family: Once a week    Attends Religious Services: More than 4 times per year     Active Member of [employer] or Organizations: Yes    Attends Engineer, structural: More than 4 times per year    Marital Status: Married  Catering manager Violence: Not At Risk (09/29/2020)   Humiliation, Afraid, Rape, and Kick questionnaire    Fear of Current or Ex-Partner: No    Emotionally Abused: No    Physically Abused: No    Sexually Abused: No    Outpatient Medications Prior to Visit  Medication Sig Dispense Refill   Acetaminophen (TYLENOL PO) Take by mouth as needed.     amoxicillin-clavulanate (AUGMENTIN) 875-125  MG tablet Take 1 tablet by mouth 2 (two) times daily. 20 tablet 0   aspirin EC 81 MG tablet Take 81 mg by mouth daily.     atorvastatin (LIPITOR) 80 MG tablet Take 1 tablet (80 mg total) by mouth daily. 90 tablet 1   Calcium Carbonate-Vit D-Min (CALCIUM 600+D PLUS MINERALS) 600-400 MG-UNIT TABS Take 2 tablets by mouth daily.      Ciclopirox 1 % shampoo Massage into scalp and leave in 10 min then rinse 2-3 x a week 120 mL 0   clopidogrel (PLAVIX) 75 MG tablet TAKE 1 TABLET BY MOUTH EVERY DAY 90 tablet 1   diclofenac Sodium (VOLTAREN) 1 % GEL Apply 4 g topically 4 (four) times daily as needed. 300 g 2   fluticasone (FLONASE) 50 MCG/ACT nasal spray Place 2 sprays into both nostrils daily as needed for allergies or rhinitis. 48 g 1   lisinopril (ZESTRIL) 20 MG tablet Take 1 tablet (20 mg total) by mouth daily. 90 tablet 1   memantine (NAMENDA) 10 MG tablet One tablet bid 180 tablet 0   OVER THE COUNTER MEDICATION as needed.     polyethylene glycol (MIRALAX / GLYCOLAX) 17 g packet Take 17 g by mouth as needed.     promethazine-dextromethorphan (PROMETHAZINE-DM) 6.25-15 MG/5ML syrup Take 5 mLs by mouth 4 (four) times daily as needed. 118 mL 0   triamcinolone cream (KENALOG) 0.1 % Apply 1 Application topically 2 (two) times daily. 80 g 1   No facility-administered medications prior to visit.    Allergies  Allergen Reactions   Hydroxyzine Anaphylaxis   Sulfa  Antibiotics Swelling    Rash      Review of Systems  Constitutional:  Negative for chills and fever.  Respiratory:  Negative for shortness of breath.   Cardiovascular:  Negative for chest pain and palpitations.  Gastrointestinal:  Negative for abdominal pain, blood in stool, constipation, diarrhea, nausea and vomiting.  Genitourinary:  Negative for dysuria, frequency, hematuria and urgency.  Skin:           Neurological:  Negative for headaches.       Objective:    Physical Exam Constitutional:      General: She is not in acute distress.    Appearance: Normal appearance. She is not ill-appearing.  HENT:     Head: Normocephalic and atraumatic.     Right Ear: External ear normal.     Left Ear: External ear normal.     Nose: Nose normal.     Mouth/Throat:     Mouth: Mucous membranes are moist.     Pharynx: Oropharynx is clear.  Eyes:     General:        Right eye: No discharge.        Left eye: No discharge.     Extraocular Movements: Extraocular movements intact.     Conjunctiva/sclera: Conjunctivae normal.     Pupils: Pupils are equal, round, and reactive to light.  Cardiovascular:     Rate and Rhythm: Normal rate and regular rhythm.     Pulses: Normal pulses.     Heart sounds: Normal heart sounds. No murmur heard.    No gallop.  Pulmonary:     Effort: Pulmonary effort is normal. No respiratory distress.     Breath sounds: Normal breath sounds. No wheezing or rales.  Abdominal:     General: Bowel sounds are normal.     Palpations: Abdomen is soft.     Tenderness: There is  no abdominal tenderness. There is no guarding.  Musculoskeletal:        General: Normal range of motion.     Cervical back: Normal range of motion.     Right lower leg: No edema.     Left lower leg: No edema.  Skin:    General: Skin is warm and dry.  Neurological:     Mental Status: She is alert and oriented to person, place, and time.  Psychiatric:        Mood and Affect: Mood normal.         Behavior: Behavior normal.        Judgment: Judgment normal.     LMP  (LMP Unknown)  Wt Readings from Last 3 Encounters:  09/04/22 136 lb (61.7 kg)  07/09/22 142 lb (64.4 kg)  06/27/22 141 lb (64 kg)    Diabetic Foot Exam - Simple   No data filed    Lab Results  Component Value Date   WBC 7.7 12/28/2021   HGB 15.2 (H) 12/28/2021   HCT 45.4 12/28/2021   PLT 185.0 12/28/2021   GLUCOSE 120 (H) 12/28/2021   CHOL 115 12/28/2021   TRIG 87.0 12/28/2021   HDL 49.80 12/28/2021   LDLCALC 48 12/28/2021   ALT 17 12/28/2021   AST 19 12/28/2021   NA 143 12/28/2021   K 4.1 12/28/2021   CL 104 12/28/2021   CREATININE 1.00 12/28/2021   BUN 21 12/28/2021   CO2 29 12/28/2021   TSH 1.36 12/28/2021   HGBA1C 6.7 (H) 12/28/2021    Lab Results  Component Value Date   TSH 1.36 12/28/2021   Lab Results  Component Value Date   WBC 7.7 12/28/2021   HGB 15.2 (H) 12/28/2021   HCT 45.4 12/28/2021   MCV 89.1 12/28/2021   PLT 185.0 12/28/2021   Lab Results  Component Value Date   NA 143 12/28/2021   K 4.1 12/28/2021   CO2 29 12/28/2021   GLUCOSE 120 (H) 12/28/2021   BUN 21 12/28/2021   CREATININE 1.00 12/28/2021   BILITOT 1.6 (H) 12/28/2021   ALKPHOS 66 12/28/2021   AST 19 12/28/2021   ALT 17 12/28/2021   PROT 6.9 12/28/2021   ALBUMIN 4.1 12/28/2021   CALCIUM 9.8 12/28/2021   ANIONGAP 11 03/12/2018   GFR 54.80 (L) 12/28/2021   Lab Results  Component Value Date   CHOL 115 12/28/2021   Lab Results  Component Value Date   HDL 49.80 12/28/2021   Lab Results  Component Value Date   LDLCALC 48 12/28/2021   Lab Results  Component Value Date   TRIG 87.0 12/28/2021   Lab Results  Component Value Date   CHOLHDL 2 12/28/2021   Lab Results  Component Value Date   HGBA1C 6.7 (H) 12/28/2021      Assessment & Plan:   Problem List Items Addressed This Visit       Cardiovascular and Mediastinum   Essential hypertension - Primary     Respiratory   Bronchitis      Endocrine   Type II diabetes mellitus     Nervous and Auditory   Mild cognitive impairment with memory loss     Genitourinary   Chronic kidney disease, stage III (moderate)   No orders of the defined types were placed in this encounter.  I, Kristina Lee, personally preformed the services described in this documentation.  All medical record entries made by the scribe were at my direction and in my presence.  I have reviewed the chart and discharge instructions (if applicable) and agree that the record reflects my personal performance and is accurate and complete. 09/20/2022  I,Mohammed Iqbal,acting as a scribe for Danise Edge, MD.,have documented all relevant documentation on the behalf of Danise Edge, MD,as directed by  Danise Edge, MD while in the presence of Danise Edge, MD.  Kristina Lee

## 2022-09-21 DIAGNOSIS — K5900 Constipation, unspecified: Secondary | ICD-10-CM | POA: Diagnosis not present

## 2022-09-21 DIAGNOSIS — E46 Unspecified protein-calorie malnutrition: Secondary | ICD-10-CM | POA: Diagnosis not present

## 2022-09-21 DIAGNOSIS — F03C0 Unspecified dementia, severe, without behavioral disturbance, psychotic disturbance, mood disturbance, and anxiety: Secondary | ICD-10-CM | POA: Diagnosis not present

## 2022-09-21 DIAGNOSIS — I10 Essential (primary) hypertension: Secondary | ICD-10-CM | POA: Diagnosis not present

## 2022-09-23 DIAGNOSIS — R404 Transient alteration of awareness: Secondary | ICD-10-CM | POA: Diagnosis not present

## 2022-09-23 DIAGNOSIS — F03911 Unspecified dementia, unspecified severity, with agitation: Secondary | ICD-10-CM | POA: Diagnosis not present

## 2022-09-23 DIAGNOSIS — S199XXA Unspecified injury of neck, initial encounter: Secondary | ICD-10-CM | POA: Diagnosis not present

## 2022-09-23 DIAGNOSIS — S0001XA Abrasion of scalp, initial encounter: Secondary | ICD-10-CM | POA: Diagnosis not present

## 2022-09-23 DIAGNOSIS — W1839XA Other fall on same level, initial encounter: Secondary | ICD-10-CM | POA: Diagnosis not present

## 2022-09-23 DIAGNOSIS — R600 Localized edema: Secondary | ICD-10-CM | POA: Diagnosis not present

## 2022-09-23 DIAGNOSIS — R531 Weakness: Secondary | ICD-10-CM | POA: Diagnosis not present

## 2022-09-23 DIAGNOSIS — R519 Headache, unspecified: Secondary | ICD-10-CM | POA: Diagnosis not present

## 2022-09-23 DIAGNOSIS — S0990XA Unspecified injury of head, initial encounter: Secondary | ICD-10-CM | POA: Diagnosis not present

## 2022-09-24 DIAGNOSIS — E1122 Type 2 diabetes mellitus with diabetic chronic kidney disease: Secondary | ICD-10-CM | POA: Diagnosis not present

## 2022-09-24 DIAGNOSIS — I129 Hypertensive chronic kidney disease with stage 1 through stage 4 chronic kidney disease, or unspecified chronic kidney disease: Secondary | ICD-10-CM | POA: Diagnosis not present

## 2022-09-24 DIAGNOSIS — R1310 Dysphagia, unspecified: Secondary | ICD-10-CM | POA: Diagnosis not present

## 2022-09-24 DIAGNOSIS — F0390 Unspecified dementia without behavioral disturbance: Secondary | ICD-10-CM | POA: Diagnosis not present

## 2022-09-24 DIAGNOSIS — N189 Chronic kidney disease, unspecified: Secondary | ICD-10-CM | POA: Diagnosis not present

## 2022-09-24 DIAGNOSIS — I2510 Atherosclerotic heart disease of native coronary artery without angina pectoris: Secondary | ICD-10-CM | POA: Diagnosis not present

## 2022-09-24 DIAGNOSIS — M5030 Other cervical disc degeneration, unspecified cervical region: Secondary | ICD-10-CM | POA: Diagnosis not present

## 2022-09-24 DIAGNOSIS — R0981 Nasal congestion: Secondary | ICD-10-CM | POA: Diagnosis not present

## 2022-09-24 DIAGNOSIS — I214 Non-ST elevation (NSTEMI) myocardial infarction: Secondary | ICD-10-CM | POA: Diagnosis not present

## 2022-09-25 DIAGNOSIS — I129 Hypertensive chronic kidney disease with stage 1 through stage 4 chronic kidney disease, or unspecified chronic kidney disease: Secondary | ICD-10-CM | POA: Diagnosis not present

## 2022-09-25 DIAGNOSIS — I2510 Atherosclerotic heart disease of native coronary artery without angina pectoris: Secondary | ICD-10-CM | POA: Diagnosis not present

## 2022-09-25 DIAGNOSIS — R0981 Nasal congestion: Secondary | ICD-10-CM | POA: Diagnosis not present

## 2022-09-25 DIAGNOSIS — E1122 Type 2 diabetes mellitus with diabetic chronic kidney disease: Secondary | ICD-10-CM | POA: Diagnosis not present

## 2022-09-25 DIAGNOSIS — R1310 Dysphagia, unspecified: Secondary | ICD-10-CM | POA: Diagnosis not present

## 2022-09-25 DIAGNOSIS — M5030 Other cervical disc degeneration, unspecified cervical region: Secondary | ICD-10-CM | POA: Diagnosis not present

## 2022-09-25 DIAGNOSIS — F0390 Unspecified dementia without behavioral disturbance: Secondary | ICD-10-CM | POA: Diagnosis not present

## 2022-09-25 DIAGNOSIS — N189 Chronic kidney disease, unspecified: Secondary | ICD-10-CM | POA: Diagnosis not present

## 2022-09-25 DIAGNOSIS — I214 Non-ST elevation (NSTEMI) myocardial infarction: Secondary | ICD-10-CM | POA: Diagnosis not present

## 2022-09-27 DIAGNOSIS — F0390 Unspecified dementia without behavioral disturbance: Secondary | ICD-10-CM | POA: Diagnosis not present

## 2022-09-27 DIAGNOSIS — I2510 Atherosclerotic heart disease of native coronary artery without angina pectoris: Secondary | ICD-10-CM | POA: Diagnosis not present

## 2022-09-27 DIAGNOSIS — E1122 Type 2 diabetes mellitus with diabetic chronic kidney disease: Secondary | ICD-10-CM | POA: Diagnosis not present

## 2022-09-27 DIAGNOSIS — I129 Hypertensive chronic kidney disease with stage 1 through stage 4 chronic kidney disease, or unspecified chronic kidney disease: Secondary | ICD-10-CM | POA: Diagnosis not present

## 2022-09-27 DIAGNOSIS — R1310 Dysphagia, unspecified: Secondary | ICD-10-CM | POA: Diagnosis not present

## 2022-09-27 DIAGNOSIS — N189 Chronic kidney disease, unspecified: Secondary | ICD-10-CM | POA: Diagnosis not present

## 2022-09-27 DIAGNOSIS — M5030 Other cervical disc degeneration, unspecified cervical region: Secondary | ICD-10-CM | POA: Diagnosis not present

## 2022-09-27 DIAGNOSIS — R0981 Nasal congestion: Secondary | ICD-10-CM | POA: Diagnosis not present

## 2022-09-27 DIAGNOSIS — I214 Non-ST elevation (NSTEMI) myocardial infarction: Secondary | ICD-10-CM | POA: Diagnosis not present

## 2022-09-28 DIAGNOSIS — R0981 Nasal congestion: Secondary | ICD-10-CM | POA: Diagnosis not present

## 2022-09-28 DIAGNOSIS — M5030 Other cervical disc degeneration, unspecified cervical region: Secondary | ICD-10-CM | POA: Diagnosis not present

## 2022-09-28 DIAGNOSIS — N189 Chronic kidney disease, unspecified: Secondary | ICD-10-CM | POA: Diagnosis not present

## 2022-09-28 DIAGNOSIS — I10 Essential (primary) hypertension: Secondary | ICD-10-CM | POA: Diagnosis not present

## 2022-09-28 DIAGNOSIS — I214 Non-ST elevation (NSTEMI) myocardial infarction: Secondary | ICD-10-CM | POA: Diagnosis not present

## 2022-09-28 DIAGNOSIS — I129 Hypertensive chronic kidney disease with stage 1 through stage 4 chronic kidney disease, or unspecified chronic kidney disease: Secondary | ICD-10-CM | POA: Diagnosis not present

## 2022-09-28 DIAGNOSIS — F0390 Unspecified dementia without behavioral disturbance: Secondary | ICD-10-CM | POA: Diagnosis not present

## 2022-09-28 DIAGNOSIS — E46 Unspecified protein-calorie malnutrition: Secondary | ICD-10-CM | POA: Diagnosis not present

## 2022-09-28 DIAGNOSIS — R1310 Dysphagia, unspecified: Secondary | ICD-10-CM | POA: Diagnosis not present

## 2022-09-28 DIAGNOSIS — E1122 Type 2 diabetes mellitus with diabetic chronic kidney disease: Secondary | ICD-10-CM | POA: Diagnosis not present

## 2022-09-28 DIAGNOSIS — I2510 Atherosclerotic heart disease of native coronary artery without angina pectoris: Secondary | ICD-10-CM | POA: Diagnosis not present

## 2022-09-28 DIAGNOSIS — F03C0 Unspecified dementia, severe, without behavioral disturbance, psychotic disturbance, mood disturbance, and anxiety: Secondary | ICD-10-CM | POA: Diagnosis not present

## 2022-10-12 DIAGNOSIS — I10 Essential (primary) hypertension: Secondary | ICD-10-CM | POA: Diagnosis not present

## 2022-10-12 DIAGNOSIS — F03C0 Unspecified dementia, severe, without behavioral disturbance, psychotic disturbance, mood disturbance, and anxiety: Secondary | ICD-10-CM | POA: Diagnosis not present

## 2022-10-12 DIAGNOSIS — Z515 Encounter for palliative care: Secondary | ICD-10-CM | POA: Diagnosis not present

## 2022-10-12 DIAGNOSIS — E46 Unspecified protein-calorie malnutrition: Secondary | ICD-10-CM | POA: Diagnosis not present

## 2022-10-19 DIAGNOSIS — I10 Essential (primary) hypertension: Secondary | ICD-10-CM | POA: Diagnosis not present

## 2022-10-19 DIAGNOSIS — E46 Unspecified protein-calorie malnutrition: Secondary | ICD-10-CM | POA: Diagnosis not present

## 2022-10-19 DIAGNOSIS — Z515 Encounter for palliative care: Secondary | ICD-10-CM | POA: Diagnosis not present

## 2022-10-19 DIAGNOSIS — F03C0 Unspecified dementia, severe, without behavioral disturbance, psychotic disturbance, mood disturbance, and anxiety: Secondary | ICD-10-CM | POA: Diagnosis not present

## 2022-10-26 DIAGNOSIS — E785 Hyperlipidemia, unspecified: Secondary | ICD-10-CM | POA: Diagnosis not present

## 2022-10-26 DIAGNOSIS — E46 Unspecified protein-calorie malnutrition: Secondary | ICD-10-CM | POA: Diagnosis not present

## 2022-10-26 DIAGNOSIS — F03C0 Unspecified dementia, severe, without behavioral disturbance, psychotic disturbance, mood disturbance, and anxiety: Secondary | ICD-10-CM | POA: Diagnosis not present

## 2022-10-26 DIAGNOSIS — I10 Essential (primary) hypertension: Secondary | ICD-10-CM | POA: Diagnosis not present

## 2022-10-26 DIAGNOSIS — Z515 Encounter for palliative care: Secondary | ICD-10-CM | POA: Diagnosis not present

## 2022-11-04 DIAGNOSIS — E876 Hypokalemia: Secondary | ICD-10-CM | POA: Diagnosis not present

## 2022-11-04 DIAGNOSIS — I10 Essential (primary) hypertension: Secondary | ICD-10-CM | POA: Diagnosis not present

## 2022-11-04 DIAGNOSIS — R451 Restlessness and agitation: Secondary | ICD-10-CM | POA: Diagnosis not present

## 2022-11-04 DIAGNOSIS — R8281 Pyuria: Secondary | ICD-10-CM | POA: Diagnosis not present

## 2022-11-05 DIAGNOSIS — R8281 Pyuria: Secondary | ICD-10-CM | POA: Diagnosis not present

## 2022-11-05 DIAGNOSIS — E876 Hypokalemia: Secondary | ICD-10-CM | POA: Diagnosis not present

## 2022-11-05 DIAGNOSIS — R451 Restlessness and agitation: Secondary | ICD-10-CM | POA: Diagnosis not present

## 2022-11-05 DIAGNOSIS — I10 Essential (primary) hypertension: Secondary | ICD-10-CM | POA: Diagnosis not present

## 2022-11-07 DIAGNOSIS — I10 Essential (primary) hypertension: Secondary | ICD-10-CM | POA: Diagnosis not present

## 2022-11-07 DIAGNOSIS — R451 Restlessness and agitation: Secondary | ICD-10-CM | POA: Diagnosis not present

## 2022-11-07 DIAGNOSIS — R8281 Pyuria: Secondary | ICD-10-CM | POA: Diagnosis not present

## 2022-11-07 DIAGNOSIS — E876 Hypokalemia: Secondary | ICD-10-CM | POA: Diagnosis not present

## 2022-11-08 DIAGNOSIS — I10 Essential (primary) hypertension: Secondary | ICD-10-CM | POA: Diagnosis not present

## 2022-11-08 DIAGNOSIS — E876 Hypokalemia: Secondary | ICD-10-CM | POA: Diagnosis not present

## 2022-11-08 DIAGNOSIS — R8281 Pyuria: Secondary | ICD-10-CM | POA: Diagnosis not present

## 2022-11-08 DIAGNOSIS — R451 Restlessness and agitation: Secondary | ICD-10-CM | POA: Diagnosis not present

## 2022-11-09 DIAGNOSIS — I10 Essential (primary) hypertension: Secondary | ICD-10-CM | POA: Diagnosis not present

## 2022-11-09 DIAGNOSIS — R8281 Pyuria: Secondary | ICD-10-CM | POA: Diagnosis not present

## 2022-11-09 DIAGNOSIS — E876 Hypokalemia: Secondary | ICD-10-CM | POA: Diagnosis not present

## 2022-11-09 DIAGNOSIS — R451 Restlessness and agitation: Secondary | ICD-10-CM | POA: Diagnosis not present

## 2022-11-12 DIAGNOSIS — I10 Essential (primary) hypertension: Secondary | ICD-10-CM | POA: Diagnosis not present

## 2022-11-12 DIAGNOSIS — R8281 Pyuria: Secondary | ICD-10-CM | POA: Diagnosis not present

## 2022-11-12 DIAGNOSIS — E876 Hypokalemia: Secondary | ICD-10-CM | POA: Diagnosis not present

## 2022-11-12 DIAGNOSIS — R451 Restlessness and agitation: Secondary | ICD-10-CM | POA: Diagnosis not present

## 2022-11-13 DIAGNOSIS — E876 Hypokalemia: Secondary | ICD-10-CM | POA: Diagnosis not present

## 2022-11-13 DIAGNOSIS — I10 Essential (primary) hypertension: Secondary | ICD-10-CM | POA: Diagnosis not present

## 2022-11-13 DIAGNOSIS — R451 Restlessness and agitation: Secondary | ICD-10-CM | POA: Diagnosis not present

## 2022-11-13 DIAGNOSIS — R8281 Pyuria: Secondary | ICD-10-CM | POA: Diagnosis not present

## 2022-11-15 DIAGNOSIS — E876 Hypokalemia: Secondary | ICD-10-CM | POA: Diagnosis not present

## 2022-11-15 DIAGNOSIS — R451 Restlessness and agitation: Secondary | ICD-10-CM | POA: Diagnosis not present

## 2022-11-15 DIAGNOSIS — I10 Essential (primary) hypertension: Secondary | ICD-10-CM | POA: Diagnosis not present

## 2022-11-15 DIAGNOSIS — R8281 Pyuria: Secondary | ICD-10-CM | POA: Diagnosis not present

## 2022-11-16 DIAGNOSIS — R8281 Pyuria: Secondary | ICD-10-CM | POA: Diagnosis not present

## 2022-11-16 DIAGNOSIS — I10 Essential (primary) hypertension: Secondary | ICD-10-CM | POA: Diagnosis not present

## 2022-11-16 DIAGNOSIS — E876 Hypokalemia: Secondary | ICD-10-CM | POA: Diagnosis not present

## 2022-11-16 DIAGNOSIS — R451 Restlessness and agitation: Secondary | ICD-10-CM | POA: Diagnosis not present

## 2022-11-17 DIAGNOSIS — I10 Essential (primary) hypertension: Secondary | ICD-10-CM | POA: Diagnosis not present

## 2022-11-17 DIAGNOSIS — R8281 Pyuria: Secondary | ICD-10-CM | POA: Diagnosis not present

## 2022-11-17 DIAGNOSIS — E876 Hypokalemia: Secondary | ICD-10-CM | POA: Diagnosis not present

## 2022-11-17 DIAGNOSIS — R451 Restlessness and agitation: Secondary | ICD-10-CM | POA: Diagnosis not present

## 2022-12-06 ENCOUNTER — Telehealth: Payer: Self-pay | Admitting: Family Medicine

## 2022-12-06 NOTE — Telephone Encounter (Signed)
Copied from CRM [id number]. Topic: Medicare AWV >> Dec 06, 2022  1:29 PM Payton Doughty wrote: Reason for CRM: Called 12/06/2022 to sched AWV - NO VOICEMAIL  Verlee Rossetti; Care Guide Ambulatory Clinical Support [place] [facility] Direct Dial: [phone]

## 2022-12-16 DEATH — deceased

## 2022-12-27 ENCOUNTER — Ambulatory Visit: Payer: Medicare PPO | Admitting: Physician Assistant
